# Patient Record
Sex: Female | Born: 1950 | Hispanic: Yes | Marital: Married | Smoking: Never smoker
Health system: Southern US, Community
[De-identification: ages and names within clinical notes are randomized; demographics above are authoritative.]

## PROBLEM LIST (undated history)

## (undated) DIAGNOSIS — F32A Depression, unspecified: Secondary | ICD-10-CM

## (undated) DIAGNOSIS — M1990 Unspecified osteoarthritis, unspecified site: Secondary | ICD-10-CM

## (undated) DIAGNOSIS — M810 Age-related osteoporosis without current pathological fracture: Secondary | ICD-10-CM

## (undated) DIAGNOSIS — I10 Essential (primary) hypertension: Secondary | ICD-10-CM

## (undated) DIAGNOSIS — A609 Anogenital herpesviral infection, unspecified: Secondary | ICD-10-CM

## (undated) DIAGNOSIS — E039 Hypothyroidism, unspecified: Secondary | ICD-10-CM

## (undated) DIAGNOSIS — C50919 Malignant neoplasm of unspecified site of unspecified female breast: Secondary | ICD-10-CM

## (undated) DIAGNOSIS — Z973 Presence of spectacles and contact lenses: Secondary | ICD-10-CM

## (undated) DIAGNOSIS — E042 Nontoxic multinodular goiter: Secondary | ICD-10-CM

## (undated) DIAGNOSIS — IMO0001 Reserved for inherently not codable concepts without codable children: Secondary | ICD-10-CM

## (undated) DIAGNOSIS — N189 Chronic kidney disease, unspecified: Secondary | ICD-10-CM

## (undated) DIAGNOSIS — M069 Rheumatoid arthritis, unspecified: Secondary | ICD-10-CM

## (undated) DIAGNOSIS — C801 Malignant (primary) neoplasm, unspecified: Secondary | ICD-10-CM

## (undated) DIAGNOSIS — B009 Herpesviral infection, unspecified: Secondary | ICD-10-CM

## (undated) DIAGNOSIS — IMO0002 Reserved for concepts with insufficient information to code with codable children: Secondary | ICD-10-CM

## (undated) DIAGNOSIS — M8580 Other specified disorders of bone density and structure, unspecified site: Secondary | ICD-10-CM

## (undated) DIAGNOSIS — T7840XA Allergy, unspecified, initial encounter: Secondary | ICD-10-CM

## (undated) DIAGNOSIS — E7800 Pure hypercholesterolemia, unspecified: Secondary | ICD-10-CM

## (undated) DIAGNOSIS — F329 Major depressive disorder, single episode, unspecified: Secondary | ICD-10-CM

## (undated) DIAGNOSIS — E119 Type 2 diabetes mellitus without complications: Secondary | ICD-10-CM

## (undated) HISTORY — DX: Reserved for concepts with insufficient information to code with codable children: IMO0002

## (undated) HISTORY — DX: Allergy, unspecified, initial encounter: T78.40XA

## (undated) HISTORY — DX: Reserved for inherently not codable concepts without codable children: IMO0001

## (undated) HISTORY — DX: Nontoxic multinodular goiter: E04.2

## (undated) HISTORY — DX: Anogenital herpesviral infection, unspecified: A60.9

## (undated) HISTORY — PX: COLONOSCOPY: SHX174

## (undated) HISTORY — DX: Rheumatoid arthritis, unspecified: M06.9

## (undated) HISTORY — DX: Depression, unspecified: F32.A

## (undated) HISTORY — DX: Hypothyroidism, unspecified: E03.9

## (undated) HISTORY — DX: Chronic kidney disease, unspecified: N18.9

## (undated) HISTORY — DX: Other specified disorders of bone density and structure, unspecified site: M85.80

## (undated) HISTORY — PX: BREAST SURGERY: SHX581

## (undated) HISTORY — DX: Unspecified osteoarthritis, unspecified site: M19.90

## (undated) HISTORY — DX: Malignant (primary) neoplasm, unspecified: C80.1

## (undated) HISTORY — DX: Herpesviral infection, unspecified: B00.9

## (undated) HISTORY — PX: WISDOM TOOTH EXTRACTION: SHX21

## (undated) HISTORY — DX: Age-related osteoporosis without current pathological fracture: M81.0

## (undated) HISTORY — DX: Essential (primary) hypertension: I10

## (undated) HISTORY — DX: Major depressive disorder, single episode, unspecified: F32.9

## (undated) HISTORY — DX: Pure hypercholesterolemia, unspecified: E78.00

## (undated) HISTORY — DX: Type 2 diabetes mellitus without complications: E11.9

---

## 1991-09-18 HISTORY — PX: CHOLECYSTECTOMY: SHX55

## 1994-09-17 HISTORY — PX: TUBAL LIGATION: SHX77

## 1998-06-25 ENCOUNTER — Encounter: Payer: Self-pay | Admitting: Emergency Medicine

## 1998-06-25 ENCOUNTER — Emergency Department (HOSPITAL_COMMUNITY): Admission: EM | Admit: 1998-06-25 | Discharge: 1998-06-25 | Payer: Self-pay | Admitting: Emergency Medicine

## 1999-12-04 ENCOUNTER — Other Ambulatory Visit: Admission: RE | Admit: 1999-12-04 | Discharge: 1999-12-04 | Payer: Self-pay | Admitting: Gynecology

## 2000-12-04 ENCOUNTER — Other Ambulatory Visit: Admission: RE | Admit: 2000-12-04 | Discharge: 2000-12-04 | Payer: Self-pay | Admitting: Gynecology

## 2000-12-23 ENCOUNTER — Ambulatory Visit (HOSPITAL_COMMUNITY): Admission: RE | Admit: 2000-12-23 | Discharge: 2000-12-23 | Payer: Self-pay | Admitting: Gynecology

## 2000-12-23 ENCOUNTER — Encounter: Payer: Self-pay | Admitting: Gynecology

## 2001-01-10 ENCOUNTER — Encounter: Payer: Self-pay | Admitting: Gynecology

## 2001-01-10 ENCOUNTER — Ambulatory Visit (HOSPITAL_COMMUNITY): Admission: RE | Admit: 2001-01-10 | Discharge: 2001-01-10 | Payer: Self-pay | Admitting: Gynecology

## 2001-01-23 ENCOUNTER — Encounter (INDEPENDENT_AMBULATORY_CARE_PROVIDER_SITE_OTHER): Payer: Self-pay | Admitting: *Deleted

## 2001-01-23 ENCOUNTER — Encounter: Payer: Self-pay | Admitting: General Surgery

## 2001-01-23 ENCOUNTER — Ambulatory Visit (HOSPITAL_COMMUNITY): Admission: RE | Admit: 2001-01-23 | Discharge: 2001-01-23 | Payer: Self-pay | Admitting: General Surgery

## 2001-07-29 ENCOUNTER — Ambulatory Visit (HOSPITAL_COMMUNITY): Admission: RE | Admit: 2001-07-29 | Discharge: 2001-07-29 | Payer: Self-pay | Admitting: General Surgery

## 2001-07-29 ENCOUNTER — Encounter: Payer: Self-pay | Admitting: General Surgery

## 2001-12-09 ENCOUNTER — Other Ambulatory Visit: Admission: RE | Admit: 2001-12-09 | Discharge: 2001-12-09 | Payer: Self-pay | Admitting: Gynecology

## 2002-02-05 ENCOUNTER — Ambulatory Visit (HOSPITAL_COMMUNITY): Admission: RE | Admit: 2002-02-05 | Discharge: 2002-02-05 | Payer: Self-pay | Admitting: General Surgery

## 2002-02-05 ENCOUNTER — Encounter: Payer: Self-pay | Admitting: General Surgery

## 2002-12-16 ENCOUNTER — Other Ambulatory Visit: Admission: RE | Admit: 2002-12-16 | Discharge: 2002-12-16 | Payer: Self-pay | Admitting: Gynecology

## 2003-02-16 ENCOUNTER — Emergency Department (HOSPITAL_COMMUNITY): Admission: EM | Admit: 2003-02-16 | Discharge: 2003-02-16 | Payer: Self-pay | Admitting: Emergency Medicine

## 2003-08-25 ENCOUNTER — Ambulatory Visit (HOSPITAL_COMMUNITY): Admission: RE | Admit: 2003-08-25 | Discharge: 2003-08-25 | Payer: Self-pay | Admitting: Gastroenterology

## 2004-03-19 ENCOUNTER — Emergency Department (HOSPITAL_COMMUNITY): Admission: EM | Admit: 2004-03-19 | Discharge: 2004-03-20 | Payer: Self-pay | Admitting: Emergency Medicine

## 2004-10-26 ENCOUNTER — Other Ambulatory Visit: Admission: RE | Admit: 2004-10-26 | Discharge: 2004-10-26 | Payer: Self-pay | Admitting: Gynecology

## 2005-11-05 ENCOUNTER — Other Ambulatory Visit: Admission: RE | Admit: 2005-11-05 | Discharge: 2005-11-05 | Payer: Self-pay | Admitting: Gynecology

## 2006-01-15 ENCOUNTER — Encounter: Admission: RE | Admit: 2006-01-15 | Discharge: 2006-01-15 | Payer: Self-pay | Admitting: Cardiology

## 2006-10-10 ENCOUNTER — Ambulatory Visit (HOSPITAL_COMMUNITY): Admission: RE | Admit: 2006-10-10 | Discharge: 2006-10-10 | Payer: Self-pay | Admitting: Gastroenterology

## 2006-11-06 ENCOUNTER — Other Ambulatory Visit: Admission: RE | Admit: 2006-11-06 | Discharge: 2006-11-06 | Payer: Self-pay | Admitting: Gynecology

## 2007-10-16 ENCOUNTER — Emergency Department (HOSPITAL_COMMUNITY): Admission: EM | Admit: 2007-10-16 | Discharge: 2007-10-16 | Payer: Self-pay | Admitting: Emergency Medicine

## 2007-11-13 ENCOUNTER — Other Ambulatory Visit: Admission: RE | Admit: 2007-11-13 | Discharge: 2007-11-13 | Payer: Self-pay | Admitting: Gynecology

## 2008-04-07 ENCOUNTER — Ambulatory Visit: Payer: Self-pay | Admitting: Gastroenterology

## 2008-04-07 DIAGNOSIS — K7689 Other specified diseases of liver: Secondary | ICD-10-CM | POA: Insufficient documentation

## 2008-04-07 DIAGNOSIS — K219 Gastro-esophageal reflux disease without esophagitis: Secondary | ICD-10-CM | POA: Insufficient documentation

## 2008-04-08 DIAGNOSIS — R1031 Right lower quadrant pain: Secondary | ICD-10-CM | POA: Insufficient documentation

## 2008-04-19 ENCOUNTER — Emergency Department (HOSPITAL_COMMUNITY): Admission: EM | Admit: 2008-04-19 | Discharge: 2008-04-20 | Payer: Self-pay | Admitting: Emergency Medicine

## 2008-04-19 ENCOUNTER — Telehealth: Payer: Self-pay | Admitting: Gastroenterology

## 2008-04-21 ENCOUNTER — Encounter: Payer: Self-pay | Admitting: Gastroenterology

## 2010-06-26 ENCOUNTER — Encounter: Admission: RE | Admit: 2010-06-26 | Discharge: 2010-06-26 | Payer: Self-pay | Admitting: Chiropractic Medicine

## 2011-02-02 NOTE — Op Note (Signed)
[name], Destiny Moody NO.:  [id number]   MEDICAL RECORD NO.:  [id number]                   PATIENT TYPE:  AMB   LOCATION:  ENDO                                 FACILITY:  [facility]   PHYSICIAN:  Anselmo Rod, M.D.               DATE OF BIRTH:  [birth year]   DATE OF PROCEDURE:  08/25/2003  DATE OF DISCHARGE:                                 OPERATIVE REPORT   PROCEDURE PERFORMED:  Screening colonoscopy.   ENDOSCOPIST:  Anselmo Rod, M.D.   INSTRUMENT USED:  Olympus videocolonoscope.   INDICATIONS FOR PROCEDURE:  This is a 60 year old female with a family  history of colon cancer and personal history of rectal bleeding, undergoing  screening colonoscopy.  Rule out colonic polyps, masses, etc.   PREPROCEDURE PREPARATION:  Informed consent was secured from the patient.  The patient fasted for eight hours prior to the procedure and prepped with a  bottle of magnesium citrate and one gallon of GoLYTELY the night prior to  the procedure.   PREPROCEDURE PHYSICAL:  VITAL SIGNS:  Stable.  NECK:  Supple.  CHEST:  Clear to auscultation.  HEART:  S1, S2 regular.  ABDOMEN:  Soft, with normal bowel sounds.   DESCRIPTION OF THE PROCEDURE:  The patient was placed in the left lateral  decubitus position and sedated with 90 mg of Demerol and 9 mg of Versed in  slow incremental doses.  Once the patient was adequately sedated and  maintained on low-flow oxygen and continuous cardiac monitor, the Olympus  videocolonoscope was advanced from the rectum to the cecum without  difficulty.  The patient had a fairly good prep.  No masses, polyps,  erosions, ulcerations, or diverticula were seen.  The appendiceal orifice  and ileocecal valve were clearly visualized and photographed.  Retroflexion  of the rectum revealed small nonbleeding internal hemorrhoids.  The patient  tolerated the procedure well without complications.   IMPRESSION:  Normal colonoscopy up to the  cecum except for small internal  hemorrhoids.   RECOMMENDATIONS:  1. Continue on high-fiber diet with liberal fluid intake.  2. Repeat CRC screening in the next five years unless the patient develops     any abnormal symptoms in the interim.  3. Outpatient follow-up as need arises in the future.                                               Anselmo Rod, M.D.    JNM/MEDQ  D:  08/25/2003  T:  08/25/2003  Job:  [id number]   cc:   Gaetano Hawthorne. Lily Peer, M.D.  [address]  [place]  [place] [address]  Fax: [phone]   Gabriel Earing, M.D.  [address]  [place]  [place] [address]  Fax: [phone]

## 2011-03-02 ENCOUNTER — Other Ambulatory Visit (HOSPITAL_COMMUNITY)
Admission: RE | Admit: 2011-03-02 | Discharge: 2011-03-02 | Disposition: A | Discharge status: Home / Self Care | Payer: Self-pay | Source: Ambulatory Visit | Attending: Gynecology | Admitting: Gynecology

## 2011-03-02 ENCOUNTER — Ambulatory Visit (INDEPENDENT_AMBULATORY_CARE_PROVIDER_SITE_OTHER): Payer: Self-pay | Admitting: Gynecology

## 2011-03-02 ENCOUNTER — Other Ambulatory Visit: Payer: Self-pay | Admitting: Gynecology

## 2011-03-02 DIAGNOSIS — Z1211 Encounter for screening for malignant neoplasm of colon: Secondary | ICD-10-CM

## 2011-03-02 DIAGNOSIS — Z124 Encounter for screening for malignant neoplasm of cervix: Secondary | ICD-10-CM | POA: Insufficient documentation

## 2011-03-02 DIAGNOSIS — Z01419 Encounter for gynecological examination (general) (routine) without abnormal findings: Secondary | ICD-10-CM

## 2011-03-02 DIAGNOSIS — I10 Essential (primary) hypertension: Secondary | ICD-10-CM

## 2011-04-17 DIAGNOSIS — E119 Type 2 diabetes mellitus without complications: Secondary | ICD-10-CM | POA: Insufficient documentation

## 2011-06-07 LAB — POCT I-STAT CREATININE: Operator id: 285841

## 2011-06-07 LAB — URINALYSIS, ROUTINE W REFLEX MICROSCOPIC
Bilirubin Urine: NEGATIVE
Glucose, UA: NEGATIVE
Nitrite: NEGATIVE
Protein, ur: NEGATIVE
Specific Gravity, Urine: 1.025

## 2011-06-07 LAB — HEPATIC FUNCTION PANEL
ALT: 37 — ABNORMAL HIGH
Bilirubin, Direct: <0.1
Total Protein: 7.4

## 2011-06-07 LAB — URINE MICROSCOPIC-ADD ON

## 2011-06-07 LAB — DIFFERENTIAL
Eosinophils Absolute: 0.1
Eosinophils Relative: 1
Lymphocytes Relative: 24
Monocytes Absolute: 0.6
Monocytes Relative: 5
Neutrophils Relative %: 70

## 2011-06-07 LAB — CBC
MCHC: 34.4
MCV: 89.6
RBC: 4.56
RDW: 12.6

## 2011-06-07 LAB — I-STAT 8, (EC8 V) (CONVERTED LAB)
Acid-base deficit: 2
Bicarbonate: 23.6
Chloride: 107
Glucose, Bld: 128 — ABNORMAL HIGH
Operator id: 285841
Potassium: 3.7
Sodium: 140
pCO2, Ven: 41.5 — ABNORMAL LOW

## 2011-06-07 LAB — URINE CULTURE

## 2011-06-15 LAB — BASIC METABOLIC PANEL
CO2: 24
Calcium: 9.6
Chloride: 105
GFR calc Af Amer: >60
GFR calc non Af Amer: >60
Sodium: 139

## 2011-06-15 LAB — DIFFERENTIAL
Eosinophils Relative: 0
Lymphocytes Relative: 14
Lymphs Abs: 2.2
Monocytes Absolute: 1.1 — ABNORMAL HIGH

## 2011-06-15 LAB — CBC
HCT: 39.6
Hemoglobin: 13.7
WBC: 16.1 — ABNORMAL HIGH

## 2011-06-15 LAB — URINALYSIS, ROUTINE W REFLEX MICROSCOPIC
Bilirubin Urine: NEGATIVE
Glucose, UA: NEGATIVE
pH: 6.5

## 2011-06-15 LAB — HEPATIC FUNCTION PANEL
Albumin: 3.9
Total Bilirubin: 1
Total Protein: 7.5

## 2011-06-15 LAB — URINE MICROSCOPIC-ADD ON

## 2011-06-15 LAB — GLUCOSE, CAPILLARY

## 2011-06-15 LAB — LIPASE, BLOOD: Lipase: 26

## 2011-06-15 LAB — URINE CULTURE: Colony Count: >=100000

## 2013-04-14 ENCOUNTER — Encounter: Payer: Self-pay | Admitting: Neurology

## 2013-04-14 ENCOUNTER — Ambulatory Visit (INDEPENDENT_AMBULATORY_CARE_PROVIDER_SITE_OTHER): Payer: BC Managed Care – PPO | Admitting: Neurology

## 2013-04-14 VITALS — BP 153/81 | HR 74 | Temp 97.7°F | Ht 62.0 in | Wt 168.0 lb

## 2013-04-14 DIAGNOSIS — Z8669 Personal history of other diseases of the nervous system and sense organs: Secondary | ICD-10-CM

## 2013-04-14 DIAGNOSIS — Z87898 Personal history of other specified conditions: Secondary | ICD-10-CM

## 2013-04-14 DIAGNOSIS — G44209 Tension-type headache, unspecified, not intractable: Secondary | ICD-10-CM

## 2013-04-14 DIAGNOSIS — Z9189 Other specified personal risk factors, not elsewhere classified: Secondary | ICD-10-CM

## 2013-04-14 DIAGNOSIS — G43009 Migraine without aura, not intractable, without status migrainosus: Secondary | ICD-10-CM

## 2013-04-14 MED ORDER — GABAPENTIN 100 MG PO CAPS: ORAL_CAPSULE | ORAL | Status: DC

## 2013-04-14 NOTE — Progress Notes (Signed)
Subjective:    Patient ID: Destiny Moody is a 62 y.o. female.  HPI  Destiny Foley, MD, PhD [facility] [address], [address] [address] [place], [place] [address]  Dear Dr. Andi Devon,   I saw your patient, Destiny Moody, upon your kind request in my neurologic clinic today for initial consultation of her headaches. The patient is unaccompanied today. As you know, Destiny Moody is a very pleasant 62 year old right-handed woman with an underlying medical history of diabetes, obesity, and sinusitis, who has been having recurrent headaches. She was recently treated for right sphenoid sinusitis which was seen on the head CT from April 2014 which I reviewed. Her CT head without contrast from 12/25/2012 showed no acute intracranial changes, calcific atherosclerotic vascular disease, right sphenoid sinusitis. She also had recent blood work which are reviewed including CMP, CBC, lipid profile, hemoglobin A1c which was elevated at 8.3. She's currently on glymeperide, lisinopril, metformin, Actos.  She reports a right headache, which is described as a constant ache which starts in the R temple area, but also a stabbing sensation around the R eye. Her headache (HA) frequency is 2 to 3 per week. She estimates that there are 9 HA days/month. There is associated nausea, and there is no photophobia and sonophobia. Never had TIA or stroke symptoms, denying sudden onset of one sided weakness, numbness, tingling, slurring of speech or droopy face, hearing loss, tinnitus, diplopia or visual field cut or monocular loss of vision, although she does add, that she had one eye vision loss for a minute last year. She also reports a fainting spell last year.  She snores, but reports no apneas and no choking sensations.   The patient was upset as she was told to come at 9 AM for her 10 AM appointment and I was also late to see her. I apologized to her and she was okay during the appointment.   Her Past  Medical History Is Significant For: Past Medical History  Diagnosis Date  . Hypothyroidism   . Rheumatoid arthritis(714.0)   . Osteopenia   . NIDDM (non-insulin dependent diabetes mellitus)   . Multinodular thyroid   . Hypercholesterolemia   . HSV infection     Her Past Surgical History Is Significant For: Past Surgical History  Procedure Laterality Date  . Cholecystectomy  1993  . Tubal ligation  1996    re annastomosis  . Cesarean section  [id number]    Her Family History Is Significant For: Family History  Problem Relation Age of Onset  . Diabetes Mother     niddm  . Heart disease Mother   . Heart failure Father   . Heart disease Father   . Ovarian cancer Cousin     Her Social History Is Significant For: History   Social History  . Marital Status: Married    Spouse Name: Arnoldo    Number of Children: 2  . Years of Education: College   Occupational History  .      Self Employed   Social History Main Topics  . Smoking status: Never Smoker   . Smokeless tobacco: Never Used  . Alcohol Use: Yes     Comment: occasionally  . Drug Use: None  . Sexually Active: None   Other Topics Concern  . None   Social History Narrative   Patient lives at home with her spouse.   Caffeine use: 1 cup daily    Her Allergies Are:  No Known Allergies:  Her Current Medications Are:  Outpatient Encounter Prescriptions as of 04/14/2013  Medication Sig Dispense Refill  . glimepiride (AMARYL) 4 MG tablet Take 4 mg by mouth 2 (two) times daily.      Marland Kitchen lisinopril (PRINIVIL,ZESTRIL) 2.5 MG tablet Take 2.5 mg by mouth daily.        . metFORMIN (GLUMETZA) 1000 MG (MOD) 24 hr tablet Take 1,000 mg by mouth 2 (two) times daily with a meal.       . pioglitazone-metformin (ACTOPLUS MET) 15-500 MG per tablet Take 1 tablet by mouth 2 (two) times daily with a meal.      . simvastatin (ZOCOR) 20 MG tablet Take 20 mg by mouth at bedtime.        . [DISCONTINUED] Calcium Carbonate-Vit D-Min  (CALCIUM 1200 PO) Take by mouth 1 dose over 24 hours.        . [DISCONTINUED] glucosamine-chondroitin 500-400 MG tablet Take by mouth.        . [DISCONTINUED] Multiple Vitamin (MULTIVITAMIN) capsule Take 1 capsule by mouth daily.        . [DISCONTINUED] TURMERIC PO Take by mouth.         No facility-administered encounter medications on file as of 04/14/2013.  : Review of Systems  Constitutional: Positive for unexpected weight change (weight gain).  HENT: Positive for trouble swallowing.        Ringing in ears Spinning Sensation  Eyes: Positive for pain.  Respiratory: Positive for cough.   Cardiovascular: Positive for leg swelling.  Endocrine: Positive for polydipsia.       Flushing  Musculoskeletal: Positive for arthralgias.  Skin:       Birth marks Itching  Allergic/Immunologic: Positive for environmental allergies.  Neurological: Positive for dizziness and headaches.       Difficulty Swallowing  Psychiatric/Behavioral: Positive for sleep disturbance (snoring).    Objective:  Neurologic Exam  Physical Exam Physical Examination:   Filed Vitals:   04/14/13 1028  BP: 153/81  Pulse: 74  Temp: 97.7 F (36.5 C)    General Examination: The patient is a very pleasant 62 y.o. female in no acute distress. She appears well-developed and well-nourished and well groomed.   HEENT: Normocephalic, atraumatic, pupils are equal, round and reactive to light and accommodation. Funduscopic exam is normal with sharp disc margins noted. Extraocular tracking is good without limitation to gaze excursion or nystagmus noted. Normal smooth pursuit is noted. Hearing is grossly intact. Tympanic membranes are clear bilaterally. Face is symmetric with normal facial animation and normal facial sensation. Speech is clear with no dysarthria noted. There is no hypophonia. There is no lip, neck/head, jaw or voice tremor. Neck is supple with full range of passive and active motion. There are no carotid bruits  on auscultation. Oropharynx exam reveals: mild mouth dryness, adequate dental hygiene and mild airway crowding. Mallampati is class II. Tongue protrudes centrally and palate elevates symmetrically.   Chest: Clear to auscultation without wheezing, rhonchi or crackles noted.  Heart: S1+S2+0, regular and normal without murmurs, rubs or gallops noted.   Abdomen: Soft, non-tender and non-distended with normal bowel sounds appreciated on auscultation.  Extremities: There is no pitting edema in the distal lower extremities bilaterally. Pedal pulses are intact.  Skin: Warm and dry without trophic changes noted. There are no varicose veins.  Musculoskeletal: exam reveals no obvious joint deformities, tenderness or joint swelling or erythema.   Neurologically:  Mental status: The patient is awake, alert and oriented in all 4 spheres. Her memory,  attention, language and knowledge are appropriate. There is no aphasia, agnosia, apraxia or anomia. Speech is clear with normal prosody and enunciation. Thought process is linear. Mood is congruent and affect is normal.  Cranial nerves are as described above under HEENT exam. In addition, shoulder shrug is normal with equal shoulder height noted. Motor exam: Normal bulk, strength and tone is noted. There is no drift, tremor or rebound. Romberg is negative. Reflexes are 2+ throughout. Toes are downgoing bilaterally. Fine motor skills are intact with normal finger taps, normal hand movements, normal rapid alternating patting, normal foot taps and normal foot agility.  Cerebellar testing shows no dysmetria or intention tremor on finger to nose testing. Heel to shin is unremarkable bilaterally. There is no truncal or gait ataxia.  Sensory exam is intact to light touch, pinprick, vibration, temperature sense and proprioception in the upper and lower extremities.  Gait, station and balance are unremarkable. No veering to one side is noted. No leaning to one side is  noted. Posture is age-appropriate and stance is narrow based. No problems turning are noted. She turns en bloc. Tandem walk is unremarkable. Intact toe and heel stance is noted.               Assessment and Plan:   In summary, Netra Foulks is a very pleasant 62 y.o.-year old female with a history of HA, which has migrainous and tension type features. Her physical exam is stable and non-focal. She is doing fairly well at this time and I reassured the patient in that regard.  I had a long chat with the patient about my findings and the diagnosis of migraine and tension HA, the prognosis and treatment options. We talked about medical treatments and non-pharmacological approaches. We talked about maintaining a healthy lifestyle in general. I encouraged the patient to eat healthy, exercise daily and keep well hydrated, to keep a scheduled bedtime and wake time routine, to not skip any meals and eat healthy snacks in between meals and to have protein with every meal.   As far as further diagnostic testing is concerned, I suggested the following today: MRI brain wo Gad, given her Hx of syncope and monocular vision loss reported. Blood work, including ESR, CRP, ANA.  As far as medications are concerned, I recommended the following at this time: trial of neurontin.  I answered all her questions today and the patient was in agreement with the above outlined plan. I would like to see the patient back in 3 months, sooner if the need arises and encouraged her to call with any interim questions, concerns, problems or updates and refill requests and test results.   Thank you very much for allowing me to participate in the care of this nice patient. If I can be of any further assistance to you please do not hesitate to call me at [phone].  Sincerely,   Destiny Foley, MD, PhD

## 2013-04-14 NOTE — Patient Instructions (Addendum)
Please remember, common headache triggers are: sleep deprivation, dehydration, overheating, stress, hypoglycemia or skipping meals, excessive pain medications or excessive alcohol use. Some people have food triggers such as aged cheese, orange juice or chocolate, especially dark chocolate. Try to avoid these headache triggers as much possible. It may be helpful to keep a headache diary to figure out what makes your headaches worse or brings them on. Some people report headache onset after exercise but studies have shown that regular exercise may actually prevent headaches from coming on. If you have exercise-induced headaches, please make sure that you drink plenty of fluid before and after exercising and that you don't over do it.  I think overall you are doing fairly well but I do want to suggest a few things today:  Remember to drink plenty of fluid, eat healthy meals and do not skip any meals. Try to eat protein with a every meal and eat a healthy snack such as fruit or nuts in between meals. Try to keep a regular sleep-wake schedule and try to exercise daily, particularly in the form of walking, 20-30 minutes a day, if you can.   As far as your medications are concerned, I would like to suggest: Start Neurontin (gabapentin) 100 mg strength: Take 1 pill nightly at bedtime for 1 week, then 2 pills nightly for 1 week, then 3 pills each night thereafter. The most common side effects reported are sedation or sleepiness. Rare side effects include balance problems, confusion.    As far as diagnostic testing: MRI brain, blood work.  I would like to see you back in 3 months, sooner if we need to. Please call us with any interim questions, concerns, problems, updates or refill requests.  Please also call us for any test results so we can go over those with you on the phone. Brett Canales is my clinical assistant and will answer any of your questions and relay your messages to me and also relay most of my messages to  you.  Our phone number is [phone]. We also have an after hours call service for urgent matters and there is a physician on-call for urgent questions. For any emergencies you know to call 911 or go to the nearest emergency room.

## 2013-04-15 LAB — ANA W/REFLEX: Anti Nuclear Antibody(ANA): NEGATIVE

## 2013-04-15 LAB — RPR: RPR: NONREACTIVE

## 2013-04-15 NOTE — Progress Notes (Signed)
Quick Note:  Please call and advise the patient that the recent labs we checked were within normal limits. We checked inflammatory markers, sedimentation rate. No further action is required on these tests at this time. Please remind patient to keep any upcoming appointments or tests and to call us with any interim questions, concerns, problems or updates. Thanks,  [name], MD, PhD    ______

## 2013-04-16 NOTE — Progress Notes (Signed)
Quick Note:  Spoke with patient and relayed results of blood work. The patient was also reminded of any future appointments. Patient understood and had no questions.  ______ 

## 2013-04-30 ENCOUNTER — Ambulatory Visit (INDEPENDENT_AMBULATORY_CARE_PROVIDER_SITE_OTHER): Payer: BC Managed Care – PPO

## 2013-04-30 DIAGNOSIS — Z87898 Personal history of other specified conditions: Secondary | ICD-10-CM

## 2013-04-30 DIAGNOSIS — G44209 Tension-type headache, unspecified, not intractable: Secondary | ICD-10-CM

## 2013-04-30 DIAGNOSIS — Z8669 Personal history of other diseases of the nervous system and sense organs: Secondary | ICD-10-CM

## 2013-04-30 DIAGNOSIS — G43009 Migraine without aura, not intractable, without status migrainosus: Secondary | ICD-10-CM

## 2013-04-30 DIAGNOSIS — R51 Headache: Secondary | ICD-10-CM

## 2013-05-01 NOTE — Progress Notes (Signed)
Quick Note:  Please call patient regarding the recent brain MRI: The brain scan showed a normal structure of the brain but mild volume loss which we call atrophy. There were changes in the deeper structures of the brain, which we call white matter changes or microvascular changes. These were mild also: These are tiny white spots, that occur with time and are seen in a variety of conditions, including with normal aging, chronic hypertension, chronic headaches, especially migraine HAs, chronic diabetes, chronic hyperlipidemia. These are not strokes and no mass or lesion or contrast enhancement was seen which is reassuring. Again, there were no acute findings, such as a stroke, or mass or blood products. No further action is required on this test at this time, other than re-enforcing the importance of good blood pressure control, good cholesterol control, good blood sugar control, and weight management. Please remind patient to keep any upcoming appointments or tests and to call us with any interim questions, concerns, problems or updates. Thanks,  [name], MD, PhD    ______

## 2013-05-04 NOTE — Progress Notes (Signed)
Quick Note:  Spoke with patient and relayed the results of their MRI as well as Dr Athar's advise or recommendations. The patient was also reminded of any future appointments. The patient understood and had no questions.  ______ 

## 2013-08-03 ENCOUNTER — Telehealth: Payer: Self-pay | Admitting: *Deleted

## 2013-08-04 NOTE — Telephone Encounter (Signed)
Spoke with pt. and rescheduled 08/12/13 appointment to 08/05/13 at 9AM.

## 2013-08-05 ENCOUNTER — Ambulatory Visit: Payer: Self-pay | Admitting: Neurology

## 2013-08-12 ENCOUNTER — Ambulatory Visit: Payer: BC Managed Care – PPO | Admitting: Neurology

## 2013-08-18 ENCOUNTER — Ambulatory Visit: Payer: BC Managed Care – PPO | Admitting: Neurology

## 2013-09-17 DIAGNOSIS — M810 Age-related osteoporosis without current pathological fracture: Secondary | ICD-10-CM

## 2013-09-17 HISTORY — DX: Age-related osteoporosis without current pathological fracture: M81.0

## 2014-04-27 ENCOUNTER — Encounter: Payer: Self-pay | Admitting: Gynecology

## 2014-04-27 ENCOUNTER — Ambulatory Visit (INDEPENDENT_AMBULATORY_CARE_PROVIDER_SITE_OTHER): Payer: BC Managed Care – PPO | Admitting: Gynecology

## 2014-04-27 ENCOUNTER — Other Ambulatory Visit (HOSPITAL_COMMUNITY)
Admission: RE | Admit: 2014-04-27 | Discharge: 2014-04-27 | Disposition: A | Discharge status: Home / Self Care | Payer: BC Managed Care – PPO | Source: Ambulatory Visit | Attending: Gynecology | Admitting: Gynecology

## 2014-04-27 VITALS — BP 122/78 | Ht 62.0 in | Wt 156.0 lb

## 2014-04-27 DIAGNOSIS — Z01419 Encounter for gynecological examination (general) (routine) without abnormal findings: Secondary | ICD-10-CM

## 2014-04-27 DIAGNOSIS — N952 Postmenopausal atrophic vaginitis: Secondary | ICD-10-CM | POA: Insufficient documentation

## 2014-04-27 DIAGNOSIS — Z780 Asymptomatic menopausal state: Secondary | ICD-10-CM

## 2014-04-27 DIAGNOSIS — Z1151 Encounter for screening for human papillomavirus (HPV): Secondary | ICD-10-CM | POA: Insufficient documentation

## 2014-04-27 DIAGNOSIS — Z79890 Hormone replacement therapy (postmenopausal): Secondary | ICD-10-CM

## 2014-04-27 DIAGNOSIS — N951 Menopausal and female climacteric states: Secondary | ICD-10-CM

## 2014-04-27 MED ORDER — NONFORMULARY OR COMPOUNDED ITEM: Status: DC

## 2014-04-27 NOTE — Progress Notes (Signed)
Diara Chaudhari 1951-02-09 [id number]   History:    63 y.o.  for annual gyn exam who has not been seen in the office since 2012. Patient is currently being followed by endocrinologists in [place], [place]. who is monitoring her type 2 diabetes, hyperlipidemia and hypertension. Patient at one time had multinodular goiter and had biopsied by general surgeon. Patient is euthyroid currently not taking any thyroid medication. Her last bone density study was in 2008 whereby the lowest T score was -2.3 at the right femoral neck. FRAX Analysis was not done at that time. Patient had a normal colonoscopy in 2004. Patient with no past history of abnormal Pap smears. Patient complaining of decreased libido and vaginal dryness but no vasomotor  symptoms. Patient not on any HRT. Patient reached menopause in her late 66s.   Past medical history,surgical history, family history and social history were all reviewed and documented in the EPIC chart.  Gynecologic History No LMP recorded. Patient is postmenopausal. Contraception: post menopausal status Last Pap: 2012. Results were: normal Last mammogram: 2012. Results were: normal  Obstetric History OB History  Gravida Para Term Preterm AB SAB TAB Ectopic Multiple Living  2 2        2     # Outcome Date GA Lbr Len/2nd Weight Sex Delivery Anes PTL Lv  2 PAR           1 PAR                ROS: A ROS was performed and pertinent positives and negatives are included in the history.  GENERAL: No fevers or chills. HEENT: No change in vision, no earache, sore throat or sinus congestion. NECK: No pain or stiffness. CARDIOVASCULAR: No chest pain or pressure. No palpitations. PULMONARY: No shortness of breath, cough or wheeze. GASTROINTESTINAL: No abdominal pain, nausea, vomiting or diarrhea, melena or bright red blood per rectum. GENITOURINARY: No urinary frequency, urgency, hesitancy or dysuria. MUSCULOSKELETAL: No joint or muscle pain, no back pain, no recent trauma.  DERMATOLOGIC: No rash, no itching, no lesions. ENDOCRINE: No polyuria, polydipsia, no heat or cold intolerance. No recent change in weight. HEMATOLOGICAL: No anemia or easy bruising or bleeding. NEUROLOGIC: No headache, seizures, numbness, tingling or weakness. PSYCHIATRIC: No depression, no loss of interest in normal activity or change in sleep pattern.     Exam: chaperone present  BP 122/78  Ht 5\' 2"  (1.575 m)  Wt 156 lb (70.761 kg)  BMI 28.53 kg/m2  Body mass index is 28.53 kg/(m^2).  General appearance : Well developed well nourished female. No acute distress HEENT: Neck supple, trachea midline, no carotid bruits, no thyroidmegaly Lungs: Clear to auscultation, no rhonchi or wheezes, or rib retractions  Heart: Regular rate and rhythm, no murmurs or gallops Breast:Examined in sitting and supine position were symmetrical in appearance, no palpable masses or tenderness,  no skin retraction, no nipple inversion, no nipple discharge, no skin discoloration, no axillary or supraclavicular lymphadenopathy Abdomen: no palpable masses or tenderness, no rebound or guarding Extremities: no edema or skin discoloration or tenderness  Pelvic:  Bartholin, Urethra, Skene Glands: Within normal limits             Vagina: No gross lesions or discharge, Atrophic changes  Cervix: No gross lesions or discharge  Uterus  anteverted, normal size, shape and consistency, non-tender and mobile  Adnexa  Without masses or tenderness  Anus and perineum  normal   Rectovaginal  normal sphincter tone without palpated masses or tenderness  Hemoccult Hemoccult card provided     Assessment/Plan:  63 y.o. female for annual exam who is overdue for her colonoscopy will schedule accordingly. Patient will also have a bone density study here in office in the next few weeks. Patient was provided with a requisition to schedule her overdue mammogram as well. We discussed the importance of calcium and vitamin D and  regular exercise for osteoporosis prevention. We also discussed importance of monthly self breast examination. For her vaginal atrophy she is going to be started on vaginal estrogen 0.02% to apply twice a week. Risks benefits and pros and cons were discussed. She was provided with fecal Hemoccult cards to submit to the office for testing at a later date.  Note: This dictation was prepared with  Dragon/digital dictation along withSmart phrase technology. Any transcriptional errors that result from this process are unintentional.   Terrance Mass MD, 10:07 AM 04/27/2014

## 2014-04-27 NOTE — Patient Instructions (Signed)
Colonoscopa (Colonoscopy) Ardelia Mems colonoscopa es un examen que se realiza para examinar todo el intestino grueso (colon). Este examen puede ayudar a Hydrographic surveyor problemas, como tumores, plipos, inflamacin y reas de Social research officer, government. El examen dura aproximadamente 1hora.  INFORME A SU MDICO:   Cualquier alergia que tenga.  Todos los [employer]ell [employer], incluidos vitaminas, hierbas, gotas oftlmicas, cremas y medicamentos de venta libre.  Problemas previos que usted o los [employer] de su familia hayan tenido con el uso de anestsicos.  Enfermedades de [employer] Soup.  Cirugas previas.  Enfermedades patolgicas. RIESGOS Y COMPLICACIONES  En general, se trata de un procedimiento seguro. Sin embargo, Games developer procedimiento, pueden surgir complicaciones. Las complicaciones posibles son:  Hemorragias.  Desgarro o ruptura de la pared del colon.  Reaccin a los Stage manager.  Infeccin (raro). ANTES DEL PROCEDIMIENTO   Consulte a su mdico si debe cambiar o suspender los medicamentos que toma habitualmente.  Posiblemente se le recete una preparacin del colon por va oral. Esto incluye beber una gran cantidad de lquido medicinal desde el da anterior a su procedimiento. El lquido har que elimine muchas heces blandas hasta que sean casi claras o de color verdoso claro. De esta manera limpiar el colon para prepararlo para el procedimiento.  No coma ni beba nada ms una vez que haya comenzado con la preparacin del colon, a menos que el mdico le indique que es seguro [place].  Pdale a alguna persona que la lleve a su casa luego del procedimiento. PROCEDIMIENTO   Le administrarn un medicamento para que pueda relajarse (sedante).  Se recostar de costado con las rodillas flexionadas.  Se insertar un tubo largo y flexible con Ardelia Mems luz y Ardelia Mems cmara en el extremo (colonoscopio) a travs del recto y dentro del colon. La cmara enviar el video hacia  una pantalla de computadora a medida que se vaya moviendo por el colon. El colonoscopio tambin libera dixido de carbono para inflar el colon. Esto ayuda a que el mdico pueda ver mejor el rea.  Durante el examen, es posible que su mdico tome una pequea muestra de tejido (biopsia) para examinarla bajo el microscopio, si se encuentran anormalidades.  El examen finaliza cuando se ha examinado todo el colon. DESPUS DEL PROCEDIMIENTO   No conduzca vehculos durante las 24horas posteriores al examen.  Es posible que encuentre una pequea cantidad de sangre en la materia fecal.  Ileene Hutchinson tenga cantidades moderadas de gases y calambres o hinchazn abdominales leves. Esto se produce a causa del gas utilizado para inflar el colon durante el examen.  Pregunte cundo estarn [employer] del examen y cmo los obtendr. Asegrese de [place]. Document Released: 06/13/2005 Document Revised: 06/24/2013 [facility] Patient Information 2015 [place]. This information is not intended to replace advice given to you by your health care provider. Make sure you discuss any questions you have with your health care provider. Densitometra sea  (Bone Densitometry) La densitometra sea es una radiografa especial que mide la densidad de los huesos y se [place] para predecir el riesgo de fracturas seas. Esta estudio se [place] para Actor contenido mineral y la densidad de los huesos para diagnosticar osteoporosis. La osteoporosis es la prdida de tejido seo que hace que el hueso se debilite. Generalmente ocurre en las mujeres que entran en la menopausia. Pero tambin pueden sufrirla los hombres y personas con otras enfermedades.  PREPARACIN PARA LA PRUEBA  No es necesaria la preparacin.  Linzie Collin EXAMINARSE?  Todas las C[employer] Corporation de 42 aos.  Las mujeres posmenopusicas (24 a 16 aos) con factores de riesgo para osteoporosis.  Las personas que han sufrido  fracturas previas realizando actividades normales.  Las personas de contextura corporal delgada (menos de 127 libras [63.5 kg] o con un ndice de masa corporal [IMC] de menos de 21).  Las personas que tengan un padre que haya sufrido una fractura de cadera o que tengan antecedentes de osteoporosis.  Los fumadores.  Las personas que sufren artritis reumatoidea.  Los que consumen alcohol en exceso (ms de [place]).  Las mujeres con menopausia temprana. CUNDO DEBE REALIZAR UN [place]?  Las guas actuales sugieren que se debe esperar por lo menos 2 aos antes de repetir una prueba de densidad sea, si la primera fue normal. Algunos estudios recientes indican que las mujeres con densidad sea normal pueden esperar unos aos antes de repetir un estudio de densitometra sea. Comente estos temas con su mdico.  HALLAZGOS NORMALES:   Normal: menos de una desviacin estndar por debajo de lo normal (superior a -1).  Osteopenia:  1 a 2,5 desviaciones estndar por debajo de lo normal (-1 a -2,5).  Osteoporosis: ms de 2,5 desviaciones estndar por debajo de lo normal (menos de -2,5). Los [employer] se informan como una "puntuacin T" y Ardelia Mems "puntuacin Z". La puntuacin T es el nmero que compara la densidad sea con la densidad sea de las mujeres jvenes y sanas. La puntuacin Z es un nmero que compara la densidad sea con las puntuaciones de mujeres de la misma [place], gnero y International aid/development worker.  Los rangos para los resultados normales pueden variar entre diferentes laboratorios y hospitales. Consulte siempre con su mdico despus de Psychologist, counselling estudio para Personal assistant significado de los [place] y si los valores se consideran "dentro de los lmites normales".  SIGNIFICADO DEL ESTUDIO  El mdico leer los resultados y Teacher, English as a foreign language con usted la importancia y el significado de los [place], as como las opciones de tratamiento y la necesidad de pruebas adicionales, si fuera  necesario.  OBTENCIN DE LOS RESULTADOS DE LAS PRUEBAS  Es su responsabilidad retirar el resultado del [place]. Consulte en el laboratorio cuando y cmo podr [employer].  Document Released: 05/28/2012 [facility] Patient Information 2015 [place]. This information is not intended to replace advice given to you by your health care provider. Make sure you discuss any questions you have with your health care provider.

## 2014-04-28 LAB — CYTOLOGY - PAP

## 2014-04-30 ENCOUNTER — Encounter: Payer: Self-pay | Admitting: Women's Health

## 2014-05-13 ENCOUNTER — Other Ambulatory Visit: Payer: Self-pay

## 2014-05-13 DIAGNOSIS — Z1231 Encounter for screening mammogram for malignant neoplasm of breast: Secondary | ICD-10-CM

## 2014-05-26 ENCOUNTER — Ambulatory Visit
Admission: RE | Admit: 2014-05-26 | Discharge: 2014-05-26 | Disposition: A | Discharge status: Home / Self Care | Payer: BC Managed Care – PPO | Source: Ambulatory Visit

## 2014-05-26 DIAGNOSIS — Z1231 Encounter for screening mammogram for malignant neoplasm of breast: Secondary | ICD-10-CM

## 2014-05-31 ENCOUNTER — Other Ambulatory Visit: Payer: Self-pay | Admitting: Gynecology

## 2014-05-31 DIAGNOSIS — R928 Other abnormal and inconclusive findings on diagnostic imaging of breast: Secondary | ICD-10-CM

## 2014-06-11 ENCOUNTER — Ambulatory Visit
Admission: RE | Admit: 2014-06-11 | Discharge: 2014-06-11 | Disposition: A | Discharge status: Home / Self Care | Payer: BC Managed Care – PPO | Source: Ambulatory Visit | Attending: Gynecology | Admitting: Gynecology

## 2014-06-11 ENCOUNTER — Telehealth: Payer: Self-pay | Admitting: *Deleted

## 2014-06-11 ENCOUNTER — Other Ambulatory Visit: Payer: Self-pay | Admitting: Gynecology

## 2014-06-11 DIAGNOSIS — R928 Other abnormal and inconclusive findings on diagnostic imaging of breast: Secondary | ICD-10-CM

## 2014-06-11 DIAGNOSIS — N63 Unspecified lump in unspecified breast: Secondary | ICD-10-CM

## 2014-06-11 NOTE — Telephone Encounter (Signed)
Pt called with questions about having repeat diagnosed mammogram, all questions answered.

## 2014-06-15 ENCOUNTER — Ambulatory Visit
Admission: RE | Admit: 2014-06-15 | Discharge: 2014-06-15 | Disposition: A | Discharge status: Home / Self Care | Payer: BC Managed Care – PPO | Source: Ambulatory Visit | Attending: Gynecology | Admitting: Gynecology

## 2014-06-15 DIAGNOSIS — N63 Unspecified lump in unspecified breast: Secondary | ICD-10-CM

## 2014-07-01 ENCOUNTER — Other Ambulatory Visit: Payer: Self-pay | Admitting: Gynecology

## 2014-07-01 ENCOUNTER — Ambulatory Visit
Admission: RE | Admit: 2014-07-01 | Discharge: 2014-07-01 | Disposition: A | Discharge status: Home / Self Care | Payer: BC Managed Care – PPO | Source: Ambulatory Visit | Attending: Gynecology | Admitting: Gynecology

## 2014-07-01 ENCOUNTER — Telehealth: Payer: Self-pay | Admitting: *Deleted

## 2014-07-01 DIAGNOSIS — N63 Unspecified lump in unspecified breast: Secondary | ICD-10-CM

## 2014-07-01 NOTE — Telephone Encounter (Signed)
Where is it?

## 2014-07-01 NOTE — Telephone Encounter (Signed)
Destiny Moody from breast center called asking if you can please co-sign the breast ultrasound order.

## 2014-07-01 NOTE — Telephone Encounter (Signed)
I am not sure how orders look on your end. You may have to check with Sharrie Rothman.

## 2014-07-15 ENCOUNTER — Telehealth: Payer: Self-pay | Admitting: *Deleted

## 2014-07-15 ENCOUNTER — Ambulatory Visit
Admission: RE | Admit: 2014-07-15 | Discharge: 2014-07-15 | Disposition: A | Discharge status: Home / Self Care | Payer: BC Managed Care – PPO | Source: Ambulatory Visit | Attending: Gynecology | Admitting: Gynecology

## 2014-07-15 ENCOUNTER — Other Ambulatory Visit: Payer: Self-pay | Admitting: Gynecology

## 2014-07-15 ENCOUNTER — Other Ambulatory Visit: Payer: BC Managed Care – PPO

## 2014-07-15 DIAGNOSIS — N63 Unspecified lump in unspecified breast: Secondary | ICD-10-CM

## 2014-07-15 NOTE — Telephone Encounter (Signed)
Pt called with questions if she should proceed with MRI of breast order by breast center. I told pt that Dr.Fernandez would recommend the same as the radiologist. Pt will keep appointment

## 2014-07-19 ENCOUNTER — Encounter: Payer: Self-pay | Admitting: Gynecology

## 2014-07-20 ENCOUNTER — Ambulatory Visit (INDEPENDENT_AMBULATORY_CARE_PROVIDER_SITE_OTHER): Payer: BC Managed Care – PPO

## 2014-07-20 ENCOUNTER — Other Ambulatory Visit: Payer: Self-pay | Admitting: Gynecology

## 2014-07-20 DIAGNOSIS — M810 Age-related osteoporosis without current pathological fracture: Secondary | ICD-10-CM

## 2014-07-20 DIAGNOSIS — Z79890 Hormone replacement therapy (postmenopausal): Secondary | ICD-10-CM

## 2014-07-20 DIAGNOSIS — Z780 Asymptomatic menopausal state: Secondary | ICD-10-CM

## 2014-07-28 ENCOUNTER — Inpatient Hospital Stay: Admission: RE | Admit: 2014-07-28 | Payer: BC Managed Care – PPO | Source: Ambulatory Visit

## 2014-08-04 ENCOUNTER — Ambulatory Visit
Admission: RE | Admit: 2014-08-04 | Discharge: 2014-08-04 | Disposition: A | Discharge status: Home / Self Care | Payer: BC Managed Care – PPO | Source: Ambulatory Visit | Attending: Gynecology | Admitting: Gynecology

## 2014-08-04 DIAGNOSIS — N63 Unspecified lump in unspecified breast: Secondary | ICD-10-CM

## 2014-08-04 MED ORDER — GADOBENATE DIMEGLUMINE 529 MG/ML IV SOLN
14.0000 mL | Freq: Once | INTRAVENOUS | Status: AC | PRN
  Administered 2014-08-04: 14 mL via INTRAVENOUS

## 2014-08-05 ENCOUNTER — Other Ambulatory Visit: Payer: Self-pay | Admitting: Gynecology

## 2014-08-05 DIAGNOSIS — R928 Other abnormal and inconclusive findings on diagnostic imaging of breast: Secondary | ICD-10-CM

## 2014-08-10 ENCOUNTER — Ambulatory Visit (INDEPENDENT_AMBULATORY_CARE_PROVIDER_SITE_OTHER): Payer: BC Managed Care – PPO | Admitting: Gynecology

## 2014-08-10 ENCOUNTER — Encounter: Payer: Self-pay | Admitting: Gynecology

## 2014-08-10 VITALS — BP 128/76

## 2014-08-10 DIAGNOSIS — M810 Age-related osteoporosis without current pathological fracture: Secondary | ICD-10-CM | POA: Insufficient documentation

## 2014-08-10 DIAGNOSIS — F32A Depression, unspecified: Secondary | ICD-10-CM

## 2014-08-10 DIAGNOSIS — F329 Major depressive disorder, single episode, unspecified: Secondary | ICD-10-CM

## 2014-08-10 DIAGNOSIS — N63 Unspecified lump in breast: Secondary | ICD-10-CM

## 2014-08-10 DIAGNOSIS — N6310 Unspecified lump in the right breast, unspecified quadrant: Secondary | ICD-10-CM

## 2014-08-10 MED ORDER — ALENDRONATE SODIUM 70 MG PO TABS: 70.0000 mg | ORAL_TABLET | ORAL | Status: DC

## 2014-08-10 NOTE — Patient Instructions (Addendum)
Alendronate weekly tablets Qu es este medicamento? El ALENDRONATO reduce la prdida de calcio de los [place]. Este medicamento se utiliza para ayudar aumentar la produccin de hueso sano y prevenir la prdida de hueso en personas con osteoporosis. Tambin se [place] para tratar la enfermedad de [place]. Este medicamento puede ser utilizado para otros usos; si tiene alguna pregunta consulte con su proveedor de atencin mdica o con su farmacutico. MARCAS COMERCIALES DISPONIBLES: Fosamax Qu le debo informar a mi profesional de la salud antes de tomar este medicamento? Necesita saber si usted presenta alguno de los siguientes problemas o situaciones: -problemas estomacales, intestinales o esofgicos, tales como reflujo cido o ERGE -enfermedad dental -enfermedad renal -bajo nivel de calcio en la sangre -bajo nivel de vitamina D -problemas al tragar -problemas de estar sentado o de pie durante 30 minutos -una reaccin alrgica o inusual al alendronato, a otros medicamentos, alimentos, colorantes o conservantes -si est embarazada o buscando quedar embarazada -si est amamantando a un beb Cmo debo utilizar este medicamento? Debe tomar este medicamento exactamente como lo indicado, si no reduciri la cantidad de medicamento que su cuerpo absorber o le puede causar dao a usted mismo. Tome su dosis por va oral por la maana, despus de levantarse. No beba ni coma nada antes de tomar su medicamento. Trague su medicamento con un vaso lleno (6 a 8 onzas) de [employer]. No tome esta tableta con ningn otro tipo de lquido. No mastique ni triture la tableta. No desayune, beba ni tome ningn otro medicamento o vitaminas durante por lo menos 30 minutos despus de tomar Coca-Cola. Despus de tomar el medicamento, permanezca sentado o de pie durante por lo menos 30 minutos; no se acueste. Tome este medicamento en el mismo da cada semana. No tome su medicamento con una frecuencia mayor a la  indicada. Hable con su pediatra para informarse acerca del uso de este medicamento en nios. Puede requerir atencin especial. Sobredosis: Pngase en contacto inmediatamente con un centro toxicolgico o una sala de urgencia si usted cree que haya tomado demasiado medicamento. ATENCIN: [employer] es solo para usted. No comparta este medicamento con nadie. Qu sucede si me olvido de una dosis? Si olvida tomar su dosis, tmela a la maana siguiente de haberlo recordado. Luego vuelva a tomar sus dosis en el da de la semana originalmente. Nunca tome 2 tabletas el mismo da. No tome dosis adicionales o dobles. Qu puede interactuar con este medicamento? -hidrxido de aluminio -anticidos -aspirina -suplementos de calcio -medicamentos antiinflamatorios, tales como ibuprofeno, naproxeno, entre otros -suplementos con hierro -suplementos de magnesio -vitaminas con minerales Puede ser que esta lista no menciona todas las posibles interacciones. Informe a su profesional de [employer]	[place] de [employer] productos a base de hierbas, medicamentos de [place] o suplementos nutritivos que est tomando. Si usted fuma, consume bebidas alcohlicas o si utiliza drogas ilegales, indqueselo tambin a su profesional de KB Home	[place]. Algunas sustancias pueden interactuar con su medicamento. A qu debo estar atento al usar Coca-Cola? Visite a su mdico o a su profesional de la salud para chequeos peridicos. Puede ser necesario que transcurra cierto tiempo antes de que pueda observar los beneficios de [place]. No deje de tomar su medicamento excepto si as lo indica su mdico. Su mdico o su profesional de la salud puede pedirle anlisis de sangre u otros examenes para Physicist, medical su evolucin. Asegurarse de que su dieta incluya la cantidad necesaria de calcio y vitamina D mientras est tomando [place], a  menos que su mdico le indica lo contrario. Hable con su profesional de la salud acerca de sus  alimentos y las vitaminas que est tomando. Algunas personas que toman este medicamento experimentan dolor grave de [place], de articulaciones o/y de msculos. Este medicamento tambin puede aumentar el riesgo de un fmur roto. Informe a su mdico inmediatamente si tiene dolor en la pierna superior o la ingle. Si experimenta dolor que no desaparece o que empeora, informe a su mdico. Este medicamento puede aumentar la sensibilidad al sol. Si desarrolla un sarpullido mientras est tomando Coca-Cola, el sarpullido puede empeorar con la exposicin al sol. Mantngase fuera de Administrator. Si no lo puede evitar, utilice ropa protectora y crema de Photographer. No utilice lmparas solares, camas solares ni cabinas solares. Qu efectos secundarios puedo tener al [employer] este medicamento? Efectos secundarios que debe informar a su mdico o a Barrister's clerk de la salud tan pronto como sea posible: -Chief of Staff, tales como erupcin cutnea o picazn, urticarias, hinchazn de la cara, labios, lengua o garganta -heces de color oscuro o con aspecto alquitranado -dolores seos, musculares o articulares -cambios en la visin -dolor en el pecho -dolor o acidez de estmago -dolor de [place], especialmente despus de tratamiento dental -dolor o dificultad para tragar -enrojecimiento, formacin de ampollas, descamacin o distensin de la piel, inclusive dentro de la boca Efectos secundarios que, por lo general, no requieren atencin mdica (debe informarlos a su mdico o a su profesional de la salud si persisten o si son molestos): -cambios en el sentido del gusto -diarrea o estreimiento -dolor ocular o picazn -dolor de cabeza -nuseas, vmito -sensacin de saciedad o gases en el estmago Puede ser que esta lista no menciona todos los posibles efectos secundarios. Comunquese a su mdico por asesoramiento mdico [employer]ana Inc. Usted puede informar los efectos secundarios a  la FDA por telfono al 1-800-FDA-1088. Dnde debo guardar mi medicina? Mantngala fuera del alcance de los nios. Gurdela a temperatura ambiente de entre 15 y 88 grados C (73 y 58 grados F). Deseche todo el medicamento que no haya utilizado, despus de la fecha de vencimiento. ATENCIN: Este folleto es un resumen. Puede ser que no cubra toda la posible informacin. Si usted tiene preguntas acerca de esta medicina, consulte con su mdico, su farmacutico o su profesional de Technical sales engineer.  2015, Elsevier/Gold Standard. (2011-11-09 13:33:28) Osteoporosis  (Osteoporosis)  La osteoporosis ocurre cuando los huesos se debilitan debido a la prdida de Manufacturing systems engineer. Los huesos dbiles pueden romperse (fractura) con ms facilidad al caerse o resbalar.  Es ms probable que desarrolle osteoporosis si:   Es mujer.  Tiene ms de 50 aos.  Pertenece a la raza blanca o asitica.  Es muy delgada.  Algn miembro de su familia ha sufrido osteoporosis.  Fuma o consume nicotina. CAUSAS   El hbito de fumar.  El consumo excesivo de alcohol.  Tener un peso por debajo del normal.  No ser activa.  No tomar el sol suficiente.  Ciertas enfermedades como la diabetes o la enfermedad de Crohn.  Ciertos medicamentos, como los corticoides o los anticonvulsivos. TRATAMIENTO  El objetivo del tratamiento es fortalecer los [place]. Hay diferentes tipos de medicamentos para [employer]. Algunos medicamentos hacen que los huesos se vuelvan ms slidos. Otros medicamentos ayudan a Ambulance person prdida del hueso. El mdico le har controles para ver si usted est recibiendo suficiente calcio y vitamina D en su dieta.  [place]  suficiente calcio y vitamina D.  Haga ejercicios con frecuencia.  Si fuma, deje de fumar. ASEGRESE DE QUE:   Comprende estas instrucciones.  Controlar su enfermedad.  Solicitar ayuda de inmediato si no mejora o si empeora. Document Released:  03/04/2012 [facility] Patient Information 2015 [place]. This information is not intended to replace advice given to you by your health care provider. Make sure you discuss any questions you have with your health care provider.

## 2014-08-10 NOTE — Progress Notes (Signed)
   Patient is a 63 year old presented to the office today to discuss her recent bone density study as well as her recent mammograms and breast biopsies for which she had questions.Patient was seen the office for her annual gynecological exam on August 11 see previous note for additional details.  Bone density studies as follows: 2008 the lowest T score was -2.3 at the right femoral neck. FRAX Analysis was not done at that time  2015 (November) her lowest T score was noted to be at the left femoral neck with a value of -2.6 equivalent to being in the osteoporotic range. But total left and right hip there was no statistically significant change from previous bone density study or at the spine. Since it was just the left femoral neck the demonstrated the lowest T score she was asked to come to the office to discuss treatment. She states she is taking her calcium and vitamin D and exercises regularly. We discussed treatment options with various types of antiresorptive agents as well as monoclonal antibodies and IV infusion or IM injections. Because of limited resources I'm going to start her on Fosamax 70 mg one by mouth every weekly and recommend repeating bone density study to monitor response to treatment in one year. We discussed the potential side effects of Fosamax to include osteonecrosis of the jaw or subtrochanteric fracture. We are going to check her calcium, vitamin D and PTH level today.  We had a lengthy discussion today of her recent screening mammograms and follow-up stereotactic core biopsy of the right breast which have been inconclusive and she is scheduled for next month for MRI directed biopsy of a distorted area of the breast above from where previous biopsy had been obtained. We spent close to 20 minutes going over all the reports in Spanish and all questions were answered.  Patient is currently going through emotional issues with her daughter but states that she does not need any  medication and she hasn't under control and that there is no suicidal ideation and has good support from her husband.

## 2014-08-11 LAB — VITAMIN D 25 HYDROXY (VIT D DEFICIENCY, FRACTURES): Vit D, 25-Hydroxy: 41 ng/mL (ref 30–100)

## 2014-08-13 LAB — PTH, INTACT AND CALCIUM
Calcium: 9.5 mg/dL (ref 8.4–10.5)
PTH: 26 pg/mL (ref 14–64)

## 2014-08-19 ENCOUNTER — Ambulatory Visit
Admission: RE | Admit: 2014-08-19 | Discharge: 2014-08-19 | Disposition: A | Discharge status: Home / Self Care | Payer: BC Managed Care – PPO | Source: Ambulatory Visit | Attending: Gynecology | Admitting: Gynecology

## 2014-08-19 ENCOUNTER — Other Ambulatory Visit: Payer: Self-pay | Admitting: Gynecology

## 2014-08-19 DIAGNOSIS — R928 Other abnormal and inconclusive findings on diagnostic imaging of breast: Secondary | ICD-10-CM

## 2014-08-24 ENCOUNTER — Ambulatory Visit
Admission: RE | Admit: 2014-08-24 | Discharge: 2014-08-24 | Disposition: A | Discharge status: Home / Self Care | Payer: BC Managed Care – PPO | Source: Ambulatory Visit | Attending: Gynecology | Admitting: Gynecology

## 2014-08-24 DIAGNOSIS — R928 Other abnormal and inconclusive findings on diagnostic imaging of breast: Secondary | ICD-10-CM

## 2014-08-24 DIAGNOSIS — C50919 Malignant neoplasm of unspecified site of unspecified female breast: Secondary | ICD-10-CM

## 2014-08-24 HISTORY — DX: Malignant neoplasm of unspecified site of unspecified female breast: C50.919

## 2014-08-24 MED ORDER — GADOBENATE DIMEGLUMINE 529 MG/ML IV SOLN: 14.0000 mL | Freq: Once | INTRAVENOUS | Status: AC | PRN

## 2014-08-26 ENCOUNTER — Telehealth: Payer: Self-pay | Admitting: *Deleted

## 2014-08-26 DIAGNOSIS — Z170 Estrogen receptor positive status [ER+]: Secondary | ICD-10-CM

## 2014-08-26 DIAGNOSIS — C50211 Malignant neoplasm of upper-inner quadrant of right female breast: Secondary | ICD-10-CM

## 2014-08-26 NOTE — Telephone Encounter (Signed)
Confirmed [facility] for 09/01/14 at 12N .  Instructions and contact information given.

## 2014-09-01 ENCOUNTER — Ambulatory Visit
Admission: RE | Admit: 2014-09-01 | Discharge: 2014-09-01 | Disposition: A | Discharge status: Home / Self Care | Payer: BC Managed Care – PPO | Source: Ambulatory Visit | Attending: Radiation Oncology | Admitting: Radiation Oncology

## 2014-09-01 ENCOUNTER — Encounter: Payer: Self-pay | Admitting: Hematology

## 2014-09-01 ENCOUNTER — Ambulatory Visit (HOSPITAL_BASED_OUTPATIENT_CLINIC_OR_DEPARTMENT_OTHER): Payer: BC Managed Care – PPO | Admitting: Hematology

## 2014-09-01 ENCOUNTER — Other Ambulatory Visit (INDEPENDENT_AMBULATORY_CARE_PROVIDER_SITE_OTHER): Payer: Self-pay | Admitting: General Surgery

## 2014-09-01 ENCOUNTER — Encounter (INDEPENDENT_AMBULATORY_CARE_PROVIDER_SITE_OTHER): Payer: Self-pay

## 2014-09-01 ENCOUNTER — Other Ambulatory Visit (HOSPITAL_BASED_OUTPATIENT_CLINIC_OR_DEPARTMENT_OTHER): Payer: BC Managed Care – PPO

## 2014-09-01 ENCOUNTER — Ambulatory Visit: Payer: BC Managed Care – PPO

## 2014-09-01 VITALS — BP 115/61 | HR 81 | Temp 98.0°F | Resp 18 | Ht 62.0 in | Wt 153.2 lb

## 2014-09-01 DIAGNOSIS — C50211 Malignant neoplasm of upper-inner quadrant of right female breast: Secondary | ICD-10-CM

## 2014-09-01 DIAGNOSIS — Z170 Estrogen receptor positive status [ER+]: Secondary | ICD-10-CM

## 2014-09-01 DIAGNOSIS — E039 Hypothyroidism, unspecified: Secondary | ICD-10-CM

## 2014-09-01 DIAGNOSIS — E119 Type 2 diabetes mellitus without complications: Secondary | ICD-10-CM

## 2014-09-01 DIAGNOSIS — Z01818 Encounter for other preprocedural examination: Secondary | ICD-10-CM

## 2014-09-01 DIAGNOSIS — C50911 Malignant neoplasm of unspecified site of right female breast: Secondary | ICD-10-CM

## 2014-09-01 LAB — CBC WITH DIFFERENTIAL/PLATELET
BASO%: 0.5 % (ref 0.0–2.0)
BASOS ABS: 0 10*3/uL (ref 0.0–0.1)
EOS ABS: 0.2 10*3/uL (ref 0.0–0.5)
EOS%: 2.4 % (ref 0.0–7.0)
HCT: 39.4 % (ref 34.8–46.6)
HEMOGLOBIN: 12.9 g/dL (ref 11.6–15.9)
LYMPH#: 2.6 10*3/uL (ref 0.9–3.3)
LYMPH%: 32.7 % (ref 14.0–49.7)
MCH: 30.1 pg (ref 25.1–34.0)
MCHC: 32.6 g/dL (ref 31.5–36.0)
MCV: 92.3 fL (ref 79.5–101.0)
MONO#: 0.5 10*3/uL (ref 0.1–0.9)
MONO%: 6.9 % (ref 0.0–14.0)
NEUT#: 4.5 10*3/uL (ref 1.5–6.5)
NEUT%: 57.5 % (ref 38.4–76.8)
Platelets: 272 10*3/uL (ref 145–400)
RBC: 4.27 10*6/uL (ref 3.70–5.45)
RDW: 12.8 % (ref 11.2–14.5)
WBC: 7.9 10*3/uL (ref 3.9–10.3)

## 2014-09-01 LAB — COMPREHENSIVE METABOLIC PANEL (CC13)
ALK PHOS: 73 U/L (ref 40–150)
ALT: 26 U/L (ref 0–55)
AST: 21 U/L (ref 5–34)
Albumin: 3.9 g/dL (ref 3.5–5.0)
Anion Gap: 9 mEq/L (ref 3–11)
BILIRUBIN TOTAL: 0.35 mg/dL (ref 0.20–1.20)
BUN: 15.5 mg/dL (ref 7.0–26.0)
CO2: 27 mEq/L (ref 22–29)
Calcium: 9.9 mg/dL (ref 8.4–10.4)
Chloride: 103 mEq/L (ref 98–109)
Creatinine: 0.8 mg/dL (ref 0.6–1.1)
EGFR: 86 mL/min/{1.73_m2} — ABNORMAL LOW (ref >90)
Glucose: 114 mg/dl (ref 70–140)
Potassium: 4.3 mEq/L (ref 3.5–5.1)
SODIUM: 140 meq/L (ref 136–145)
TOTAL PROTEIN: 7.1 g/dL (ref 6.4–8.3)

## 2014-09-01 NOTE — Progress Notes (Signed)
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  Biopsy results revealed the tumor's hormone status as ER positive, PR positive, and HER2/neu negative. Ki67 is 12%.  She presents today with her daughter to the [facility]) for treatment consideration and recommendations from the breast surgeon, radiation oncologist, and medical oncologist.     I briefly met with Destiny Moody and her daughter during her [facility] visit today. We discussed the purpose of the Survivorship Clinic, which will include monitoring for recurrence, coordinating completion of age and gender-appropriate cancer screenings, promotion of overall wellness, as well as managing potential late/long-term side effects of anti-cancer treatments.    As of today, the treatment plan for Destiny Moody will likely include surgery and radiation therapy.  Anti-estrogen treatment will be considered for her as well.The intent of treatment for Destiny Moody is cure, therefore she will be eligible for the Survivorship Clinic upon her completion of treatment.  Her survivorship care plan (SCP) document will be drafted and updated throughout the course of her treatment trajectory. She will receive the SCP in an office visit with myself in the Survivorship Clinic once she has completed treatment.   Destiny Moody was encouraged to ask questions and all questions were answered to her satisfaction.  She was given my business card and encouraged to contact me with any concerns regarding survivorship.  I look forward to participating in her care.   Mike Craze, NP

## 2014-09-01 NOTE — Progress Notes (Signed)
Checked in new pt with no financial concerns at this time.  Informed pt that if chemo is part of her treatment Raquel will call her ins to see if Josem Kaufmann is req and will obtain that if it is and will also contact different foundations that offer copay assistance if needed.  She has Raquel's card for any billing or insurance questions or concerns.

## 2014-09-01 NOTE — Progress Notes (Signed)
  Radiation Oncology         [id number]) [phone] ________________________________  Initial outpatient Consultation - Date: 09/01/2014   Name: Destiny Moody MRN: [id number]   DOB: [birth year]  REFERRING PHYSICIAN: Fanny Skates, MD  DIAGNOSIS:    ICD-9-CM ICD-10-CM   1. Breast cancer of upper-inner quadrant of right female breast 174.2 C50.211     STAGE: Breast cancer of upper-inner quadrant of right female breast   Staging form: Breast, AJCC 7th Edition     Clinical stage from 09/01/2014: Stage IA (T1c, N0, M0) - Unsigned  HISTORY OF PRESENT ILLNESS::Destiny Moody is a 63 y.o. female  Who was found to have a density of the right breast on screening mammogram.A stereotactic biopsy was performed and was discordant. MRI biopsy was recommended and showed an invasive ductal carcinoma which was Grade 1-2 with associated DCIS and LVI. She was ER+PR+ HER2-. Ki67 was 12%. She has recovered well from her biopsy.  She has recovered well from her biopsy. She is accompanied by her daughter. Our spanish translator was unable to stay for this meeting.  She had menses at 12 and is post menopausal. She did not take HRT and is GxP2 with first birth at 37.   PREVIOUS RADIATION THERAPY: No  FAMILY HISTORY:  Family History  Problem Relation Age of Onset  . Diabetes Mother     niddm  . Heart disease Mother   . Heart failure Father   . Heart disease Father   . Ovarian cancer Cousin     SOCIAL HISTORY:  History  Substance Use Topics  . Smoking status: Never Smoker   . Smokeless tobacco: Never Used  . Alcohol Use: Yes     Comment: occasionally    REVIEW OF SYSTEMS:  A 15 point review of systems is documented in the electronic medical record. This was obtained by the nursing staff. However, I reviewed this with the patient to discuss relevant findings and make appropriate changes.  Pertinent positives are included in the chart.   PHYSICAL EXAM: There were no vitals filed for this visit.. . Pleasant female  in no distress. Alert and oriented x 3. I did not perform a breast examination.   IMPRESSION: T1c N0 Invasive Ductal Carcinoma of the left breast  PLAN: I spoke to the patient today regarding her diagnosis and options for treatment. We discussed the equivalence in terms of survival and local failure between mastectomy and breast conservation. We discussed the role of radiation in decreasing local failures in patients who undergo lumpectomy. We discussed the process of simulation and the placement tattoos. We discussed 4-6 weeks of treatment as an outpatient. We discussed the possibility of asymptomatic lung damage. We discussed the low likelihood of secondary malignancies. We discussed the possible side effects including but not limited to skin redness, fatigue, permanent skin darkening, and breast swelling. We discussed the process of simulation and the placement of tattoos. I will see her back after her Oncotype score. I did clarify with her that if she needed chemotherapy this would be performed prior to radiation. She met with medical oncology as well as a member of our patient family support team and our physical therapist. I will plan on seeing her back after her surgery.  I spent 40 minutes face to face with the patient and more than 50% of that time was spent in counseling and/or coordination of care.   ------------------------------------------------  Thea Silversmith, MD

## 2014-09-01 NOTE — Progress Notes (Addendum)
[place]  Telephone:([phone]) [phone] Fax:([phone]) [place] Note   Patient Care Team: Thurman Coyer, MD as PCP - General (Internal Medicine) Terrance Mass, MD as Consulting Physician (Gynecology) Fanny Skates, MD as Consulting Physician (General Surgery) Truitt Merle, MD as Consulting Physician (Hematology) Thea Silversmith, MD as Consulting Physician (Radiation Oncology) Trinda Pascal, NP as Nurse Practitioner (Nurse Practitioner) 09/01/2014  CHIEF COMPLAINTS/PURPOSE OF CONSULTATION:  Newly diagnosed Left breast IDA and DCIS     Breast cancer of upper-inner quadrant of right female breast   08/05/2014 Breast MRI 1.7cm non mass enhancement in the medial right breast   08/24/2014 Initial Biopsy IDA and DCIS, ER 100%, PR 99%, HER2 -, Ki 67% 12%.    08/26/2014 Initial Diagnosis Breast cancer of upper-inner quadrant of right female breast     HISTORY OF PRESENTING ILLNESS:  Destiny Moody 63 y.o. female presents to our breast [place] today for her newly diagnosed breast caner.   She had abnormal screening mammogram in September 2015, underwent biopsy on 06/15/2014 which was negative for malignancy. She developed I small hematoma after biopsy, which required drainage afterwards. She finally had a bilateral breast MRI , which showed a 1.7 cm non-mass enhancement in the medial right breast. She had repeated mammogram on 08/19/2014 which showed again non-mass like enhancement in the right breast which was biopsied before. 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 She tolerates the biopsy well without any complications.  She feels well overall. She denies any new symptoms lately. She owns a grocery store, is physically very active. She has good appetite and energy level, no recent weight loss.  MEDICAL HISTORY:  Past Medical History  Diagnosis Date  . Hypothyroidism   . Rheumatoid  arthritis(714.0)   . Osteopenia   . NIDDM (non-insulin dependent diabetes mellitus)   . Multinodular thyroid   . Hypercholesterolemia   . HSV infection   . Osteoporosis 2015    SURGICAL HISTORY: Past Surgical History  Procedure Laterality Date  . Cholecystectomy  1993  . Tubal ligation  1996    re annastomosis  . Cesarean section  [id number]    SOCIAL HISTORY: History   Social History  . Marital Status: Married    Spouse Name: Arnoldo    Number of Children: 2  . Years of Education: College   Occupational History  .      Self Employed   Social History Main Topics  . Smoking status: Never Smoker   . Smokeless tobacco: Never Used  . Alcohol Use: Yes     Comment: occasionally  . Drug Use: No  . Sexual Activity: Not on file   Other Topics Concern  . Not on file   Social History Narrative   Patient lives at home with her spouse.   Caffeine use: 1 cup daily   GYN HISTORY  Menarchal: 12 LMP: 42 Contraceptive: 1 yr HRT: no  G2P2:   FAMILY HISTORY: Family History  Problem Relation Age of Onset  . Diabetes Mother     niddm  . Heart disease Mother   . Heart failure Father   . Heart disease Father   . Ovarian cancer Cousin     ALLERGIES:  has No Known Allergies.  MEDICATIONS:  Current Outpatient Prescriptions  Medication Sig Dispense Refill  . aspirin 81 MG tablet Take 81 mg by mouth daily.    . calcium carbonate (OS-CAL) 600 MG  TABS tablet Take 600 mg by mouth daily with breakfast.     . Exenatide (BYDUREON [place]) Inject into the skin once a week.     Marland Kitchen glimepiride (AMARYL) 4 MG tablet Take 4 mg by mouth daily with breakfast.     . lisinopril (PRINIVIL,ZESTRIL) 2.5 MG tablet Take 2.5 mg by mouth daily.      . metFORMIN (GLUMETZA) 1000 MG (MOD) 24 hr tablet Take 1,000 mg by mouth 2 (two) times daily with a meal.     . alendronate (FOSAMAX) 70 MG tablet Take 1 tablet (70 mg total) by mouth once a week. Take with a full glass of water on an empty stomach.  (Patient not taking: Reported on 09/01/2014) 30 tablet 11  . gabapentin (NEURONTIN) 100 MG capsule Take 1 pill nightly at bedtime for 1 week, then 2 pills nightly for 1 week, then 3 pills each night thereafter. (Patient not taking: Reported on 09/01/2014) 90 capsule 3  . Glucosamine 750 MG TABS Take by mouth.    . NONFORMULARY OR COMPOUNDED ITEM Estradiol 0.02 % 57m prefilled applicator Sig: apply twice a week (Patient not taking: Reported on 09/01/2014) 24 each 4  . pioglitazone-metformin (ACTOPLUS MET) 15-500 MG per tablet Take 1 tablet by mouth 2 (two) times daily with a meal.    . simvastatin (ZOCOR) 20 MG tablet Take 20 mg by mouth at bedtime.       No current facility-administered medications for this visit.    REVIEW OF SYSTEMS:   Constitutional: Denies fevers, chills or abnormal night sweats Eyes: Denies blurriness of vision, double vision or watery eyes Ears, nose, mouth, throat, and face: Denies mucositis or sore throat Respiratory: Denies cough, dyspnea or wheezes Cardiovascular: Denies palpitation, chest discomfort or lower extremity swelling Gastrointestinal:  Denies nausea, heartburn or change in bowel habits Skin: Denies abnormal skin rashes Lymphatics: Denies new lymphadenopathy or easy bruising Neurological:Denies numbness, tingling or new weaknesses Behavioral/Psych: Mood is stable, no new changes  All other systems were reviewed with the patient and are negative.  PHYSICAL EXAMINATION: ECOG PERFORMANCE STATUS: 0 - Asymptomatic  Filed Vitals:   09/01/14 1308  BP: 115/61  Pulse: 81  Temp: 98 F (36.7 C)  Resp: 18   Filed Weights   09/01/14 1308  Weight: 153 lb 3.2 oz (69.491 kg)    GENERAL:alert, no distress and comfortable SKIN: skin color, texture, turgor are normal, no rashes or significant lesions EYES: normal, conjunctiva are pink and non-injected, sclera clear OROPHARYNX:no exudate, no erythema and lips, buccal mucosa, and tongue normal  NECK:  supple, thyroid normal size, non-tender, without nodularity LYMPH:  no palpable lymphadenopathy in the cervical, axillary or inguinal LUNGS: clear to auscultation and percussion with normal breathing effort HEART: regular rate & rhythm and no murmurs and no lower extremity edema ABDOMEN:abdomen soft, non-tender and normal bowel sounds Musculoskeletal:no cyanosis of digits and no clubbing  PSYCH: alert & oriented x 3 with fluent speech NEURO: no focal motor/sensory deficits Breasts: Breast inspection showed them to be symmetrical with no nipple discharge. (+) Skin bruising at the right upper outer quadrant of her breast, and fullness at the biopsy site. Palpation of the left breasts and axilla revealed no obvious mass that I could appreciate.   LABORATORY DATA:  I have reviewed the data as listed Lab Results  Component Value Date   WBC 7.9 09/01/2014   HGB 12.9 09/01/2014   HCT 39.4 09/01/2014   MCV 92.3 09/01/2014   PLT 272 09/01/2014  Recent Labs  08/10/14 1608 09/01/14 1221  NA  --  140  K  --  4.3  CO2  --  27  GLUCOSE  --  114  BUN  --  15.5  CREATININE  --  0.8  CALCIUM 9.5 9.9  PROT  --  7.1  ALBUMIN  --  3.9  AST  --  21  ALT  --  26  ALKPHOS  --  73  BILITOT  --  0.35   PATHOLOGY REPORT 08/24/2014 Breast, right, needle core biopsy, UIQ - INVASIVE DUCTAL CARCINOMA. - DUCTAL CARCINOMA IN SITU. - LYMPHOVASCULAR INVASION IS IDENTIFIED. - SEE COMMENT. Microscopic Comment  Estrogen Receptor: 100%, POSITIVE, STRONG STAINING INTENSITY Progesterone Receptor: 99%, POSITIVE, STRONG STAINING INTENSITY Proliferation Marker Ki67: 12% HER-2/NEU BY CISH - NO AMPLIFICATION OF HER-2 DETECTED. RESULT RATIO OF HER2: CEP 17 SIGNALS 0.91  RADIOGRAPHIC STUDIES: I have personally reviewed the radiological images as listed and agreed with the findings in the report.  Mr Breast Bilateral W Wo Contrast 08/05/2014    IMPRESSION:  1. Post biopsy changes in the medial right  breast at posterior depth. A 1.7 cm AP diameter area of linear clumped non mass enhancement is noted superior and lateral to the biopsy site which represents a good MR correlate to the previously noted mammographic finding.  2. No MR findings of malignancy in the left breast.  3. No adenopathy.    Mammogram and [place] 06/12/2014 IMPRESSION: 1. Persistent asymmetry with mild distortion in the medial aspect of the right breast for which biopsy is recommended. 2. No sonographic correlate is identified for this finding. Stereotactic guided core biopsy is recommended.  ASSESSMENT & PLAN:   63 year old postmenopausal woman with past medical history of diabetes, hypothyroidism, rheumatoid arthritis, who was recently found to have a early stage of right breast cancer.  1. Right breast invasive ductal carcinoma, T1 cN0 M0, stage IA. ER 100% positive, PR 99% positive, HER-2 negative, Ki-67 12%. -She was by our surgeon Dr. Dalbert Batman today, and most likely will have lumpectomy and sentinel lymph node biopsy.  -I recommend adjuvant endocrine therapy with aromatase inhibitor, such as Aromasin, to reduce her risks of cancer recurrence after surgery. -I discussed the role of adjuvant chemotherapy in stage I breast cancer. Although she has multiple comorbid, she is relatively young with good performance status, would be a candidate for adjuvant chemotherapy if needed. I discussed the role of Oncotype test to predict her risk of cancer recurrence, given her ER/PR positive node-negative disease, I recommend her to have Oncotype test from her surgical sample. If her Oncotype recurrence score is high, I would recommend chemotherapy, likely 4 cycles of Cytoxan and docetaxel. -She is likely also would have adjuvant radiation after lumpectomy.  2. Right breast DCIS -Right lumpectomy and adjuvant aromatase inhibitor.   3. Dm, RA, hypothyroidism -She will continue to follow-up with her primary care physician  Plan -She is  likely going to have right lumpectomy. If her surgical path and staging remains unchanged, I will send her surgical sample out for Oncotype test. -Return to my clinic in 3 weeks after her surgery to discuss Oncotype results and decision about Adjuvant chemotherapy -Start adjuvant Aromasin for 5 years after she completes adjuvant irradiation.   All questions were answered. The patient knows to call the clinic with any problems, questions or concerns. I spent 40 minutes counseling the patient face to face. The total time spent in the appointment was 60 minutes and more than 50%  was on counseling.     Truitt Merle, MD 09/01/2014 6:37 PM

## 2014-09-06 ENCOUNTER — Telehealth: Payer: Self-pay | Admitting: *Deleted

## 2014-09-06 NOTE — Telephone Encounter (Signed)
Spoke with patient from [facility] 09/01/14.  She is doing well.  Encouraged her to call with any needs or concerns.

## 2014-09-08 ENCOUNTER — Encounter: Payer: Self-pay | Admitting: General Practice

## 2014-09-08 NOTE — Progress Notes (Signed)
[place] Psychosocial Distress Screening Spiritual Care Met with Destiny Moody and dtr Destiny Moody at breast clinic to introduce [place] team/resources and to review distress screen per protocol.  The patient scored a 1 on the Psychosocial Distress Thermometer which indicates mild distress. Also assessed for distress and other psychosocial needs.   ONCBCN DISTRESS SCREENING 09/08/2014  Screening Type Initial Screening  Distress experienced in past week (1-10) 0  Practical problem type Insurance;Work/school  Emotional problem type Adjusting to illness  Spiritual/Religous concerns type Relating to God  Information Concerns Type Lack of info about diagnosis;Lack of info about treatment;Lack of info about complementary therapy choices  Referral to support programs Yes  Other Destiny Moody shared that she is originally from [place] and has lived in the [place], sharing and processing stories about people's stereotypes and snap judgments of her.  She was appreciative of opportunity to be heard and met as an individual person. She also used opportunity to process her experience of anxiety-producing service during early diagnostic care (prior to [facility]).  She reported decreased stress upon learning more about dx/tx plans and support resources available at [facility].  Pt reports that her concern of "relating to God" is related to lack of culturally and linguistically sensitive leadership in her church.  Provided pastoral presence, reflective listening, normalization/affirmation of feelings, and emotional support.  Family aware of ongoing chaplain/Support Center team availability and contact info, but please also page as needs arise.  Thank you.  Follow up needed: No.  [place], [name]

## 2014-09-17 HISTORY — PX: BREAST LUMPECTOMY: SHX2

## 2014-09-27 ENCOUNTER — Telehealth: Payer: Self-pay | Admitting: *Deleted

## 2014-09-27 NOTE — Telephone Encounter (Signed)
Spoke with patient today concerning her referral from her primary MD to [facility].  Per [facility] she has to call her primary MD and get a referral per her insurance.  Patient states she called last week and Dr. Collier Flowers office was supposed to call and make the referral.  Left Debbie at [facility] a message to follow up.

## 2014-09-29 ENCOUNTER — Ambulatory Visit (INDEPENDENT_AMBULATORY_CARE_PROVIDER_SITE_OTHER): Payer: 59 | Admitting: Family Medicine

## 2014-09-29 ENCOUNTER — Ambulatory Visit (INDEPENDENT_AMBULATORY_CARE_PROVIDER_SITE_OTHER): Payer: 59

## 2014-09-29 VITALS — BP 108/62 | HR 85 | Temp 98.4°F | Resp 18 | Ht 62.5 in | Wt 154.0 lb

## 2014-09-29 DIAGNOSIS — C50912 Malignant neoplasm of unspecified site of left female breast: Secondary | ICD-10-CM

## 2014-09-29 DIAGNOSIS — E119 Type 2 diabetes mellitus without complications: Secondary | ICD-10-CM

## 2014-09-29 DIAGNOSIS — Z01818 Encounter for other preprocedural examination: Secondary | ICD-10-CM

## 2014-09-29 NOTE — Patient Instructions (Signed)
We will refer you to endocrinologist and Dr. Dalbert Batman as discussed. If forms needed prior to your surgery - let me know.  Plan on follow up with me in next 3-6 months for a physical, but if medication refills needed prior to that time - let me know to determine if office visit needed or can possibly call them in.  Return to the clinic or go to the nearest emergency room if any of your symptoms worsen or new symptoms occur.

## 2014-09-29 NOTE — Progress Notes (Addendum)
Subjective:    Patient ID: Destiny Moody, female    DOB: [birth year], 64 y.o.   MRN: [id number] This chart was scribed for [name], MD by Marti Sleigh, Medical Scribe. This patient was seen in Room 10 and the patient's care was started a 4:56 PM.  Chief Complaint  Patient presents with  . Medical Clearance    breast cancer     HPI HPI Comments: Destiny Moody is a 64 y.o. female with a HX of DM, and recent breast cancer diagnosis who presents to [facility] for an office visit. Pt states that she wanted to meet Dr. Carlota Raspberry because she needs a new PCP, and her insurance has told her that [facility] was her new care provider. Pt also states she needs a referral to [facility] as well as a referral to an endocrinologist in [place]. Pt states she was diagnosed with Breast cancer in November, 2015. Pt states she has seen her endocrineologist every six months, and her last A1C was 6.1. Pt's DM is followed by Dr. Quentin Ore in [place]. Dr. Cathe Mons is her surgeon. Pt states her previous PCP was Dr. Thea Silversmith, for the last six months.   Pt is seeing Dr. Melvyn Neth today for medical clearance. Pt was diagnosed with T1c N0 Invasive Ductal Carcinoma of the left breast in November, 2015. Most recent eval with Dr. Jenkins Rouge with the breast clinic was on December 16th. That was where mastectomy and breast conservation were discussed. Pt states she gets a shot once per week (exenatide) for her DM and takes Amaryl, metformin, and Actos. Pt states her blood sugar dropped to 50 and 60 twice in the last six months when she was emotionally distressed.   Pre-operative Clearance: Pt denies lung problems, abnormal bleeding, or diagnosis of bleeding disorder. Pt states she was under anesthesia many years ago without issue. Pt does not smoke. Pt has never been anemic. Pt had a normal CBC on December 16th, and normal CMP except abnormal EGFR86. Pt states she has a past hx of TIA, with MRI completed that showed small  vesicle changes. Pt denies kidney issues. Pt is not on insulin. Pt denies CP or SOB walking up a flight of stairs, or with physical exercise. Pt states she works on her feel all day long. Pt has no hx of heart problems. Pt has has family hx of heart issues (father, who died at 60 from a MI). Pt has never had a cardiac stress test. Pt states she exercises occasionally. Pt states she received an EKG 2-3 years ago, which she believes was normal.    Patient Active Problem List   Diagnosis Date Noted  . Breast cancer of upper-inner quadrant of right female breast 08/26/2014  . Osteoporosis 08/10/2014  . Vaginal atrophy 04/27/2014  . RLQ PAIN 04/08/2008  . GERD 04/07/2008  . FATTY LIVER DISEASE 04/07/2008   Past Medical History  Diagnosis Date  . Hypothyroidism   . Rheumatoid arthritis(714.0)   . Osteopenia   . NIDDM (non-insulin dependent diabetes mellitus)   . Multinodular thyroid   . Hypercholesterolemia   . HSV infection   . Osteoporosis 2015  . Cancer    Past Surgical History  Procedure Laterality Date  . Cholecystectomy  1993  . Tubal ligation  1996    re annastomosis  . Cesarean section  [id number]   No Known Allergies Prior to Admission medications   Medication Sig Start Date End Date Taking? Authorizing Provider  amitriptyline (ELAVIL) 25 MG tablet Take 25  mg by mouth at bedtime.   Yes Historical Provider, MD  Exenatide ER 2 MG PEN Inject 2 mg into the skin once a week.   Yes Historical Provider, MD  fluticasone (FLOVENT DISKUS) 50 MCG/BLIST diskus inhaler Inhale 1 puff into the lungs 2 (two) times daily.   Yes Historical Provider, MD  glimepiride (AMARYL) 4 MG tablet Take 4 mg by mouth daily with breakfast.    Yes Historical Provider, MD  Glucosamine 750 MG TABS Take by mouth.   Yes Historical Provider, MD  lisinopril (PRINIVIL,ZESTRIL) 2.5 MG tablet Take 2.5 mg by mouth daily.     Yes Historical Provider, MD  metFORMIN (GLUMETZA) 1000 MG (MOD) 24 hr tablet Take 1,000 mg  by mouth 2 (two) times daily with a meal.    Yes Historical Provider, MD  pioglitazone (ACTOS) 30 MG tablet Take 30 mg by mouth daily.   Yes Historical Provider, MD  simvastatin (ZOCOR) 20 MG tablet Take 20 mg by mouth at bedtime.     Yes Historical Provider, MD  alendronate (FOSAMAX) 70 MG tablet Take 1 tablet (70 mg total) by mouth once a week. Take with a full glass of water on an empty stomach. Patient not taking: Reported on 09/01/2014 08/10/14   Terrance Mass, MD  aspirin 81 MG tablet Take 81 mg by mouth daily.    Historical Provider, MD  NONFORMULARY OR COMPOUNDED ITEM Estradiol 0.02 % 24m prefilled applicator Sig: apply twice a week Patient not taking: Reported on 09/01/2014 04/27/14   JTerrance Mass MD   History   Social History  . Marital Status: Married    Spouse Name: Arnoldo    Number of Children: 2  . Years of Education: College   Occupational History  .      Self Employed   Social History Main Topics  . Smoking status: Never Smoker   . Smokeless tobacco: Never Used  . Alcohol Use: Yes     Comment: occasionally  . Drug Use: No  . Sexual Activity: Not on file   Other Topics Concern  . Not on file   Social History Narrative   Patient lives at home with her spouse.   Caffeine use: 1 cup daily     Review of Systems  Constitutional: Negative for fever and chills.  Respiratory: Negative for cough and shortness of breath.   Cardiovascular: Negative for chest pain.       Objective:   Physical Exam  Constitutional: She is oriented to person, place, and time. She appears well-developed and well-nourished.  HENT:  Head: Normocephalic and atraumatic.  Eyes: Conjunctivae and EOM are normal. Pupils are equal, round, and reactive to light.  Neck: No JVD present. Carotid bruit is not present.  Cardiovascular: Normal rate, regular rhythm, normal heart sounds and intact distal pulses.   Pulmonary/Chest: Effort normal and breath sounds normal. No respiratory  distress.  Abdominal: Soft. She exhibits no pulsatile midline mass. There is no tenderness.  Neurological: She is alert and oriented to person, place, and time.  Skin: Skin is warm and dry.  Psychiatric: She has a normal mood and affect. Her behavior is normal.  Nursing note and vitals reviewed.   Filed Vitals:   09/29/14 1610  BP: 108/62  Pulse: 85  Temp: 98.4 F (36.9 C)  TempSrc: Oral  Resp: 18  Height: 5' 2.5" (1.588 m)  Weight: 154 lb (69.854 kg)  SpO2: 98%  [facility] reading (PRIMARY) by  Dr. GCarlota Raspberry CXR: NAD, no  concerning findings.   EKG: SR, no acute findings.     Assessment & Plan:   Destiny Moody is a 64 y.o. female Preoperative clearance - Plan: EKG 12-Lead, DG Chest 2 View  - not sure if will need form completed, but based on low risk surgery and review of other PMH, as well as able to achieve 4-49mts of activity, no apparent increased risk with surgery. Borderline eGFR - maintain hydration postoperatively.   Breast cancer, left breast - Plan: Ambulatory referral to General Surgery - where seen prior.   DM - controlled. - referred to local endocrinologist as new insurance requirements.   Health Maintenance - will schedule CPE with me in next few months.   Meds ordered this encounter  Medications  . Exenatide ER 2 MG PEN    Sig: Inject 2 mg into the skin once a week.  . fluticasone (FLOVENT DISKUS) 50 MCG/BLIST diskus inhaler    Sig: Inhale 1 puff into the lungs 2 (two) times daily.  .Marland Kitchenamitriptyline (ELAVIL) 25 MG tablet    Sig: Take 25 mg by mouth at bedtime.  . pioglitazone (ACTOS) 30 MG tablet    Sig: Take 30 mg by mouth daily.   Patient Instructions  We will refer you to endocrinologist and Dr. IDalbert Batmanas discussed. If forms needed prior to your surgery - let me know.  Plan on follow up with me in next 3-6 months for a physical, but if medication refills needed prior to that time - let me know to determine if office visit needed or can possibly call them in.    Return to the clinic or go to the nearest emergency room if any of your symptoms worsen or new symptoms occur.

## 2014-09-30 NOTE — Progress Notes (Signed)
CPE set up on 01/17/15 at 1:30 with Dr. Carlota Raspberry.

## 2014-10-04 ENCOUNTER — Encounter: Payer: Self-pay | Admitting: *Deleted

## 2014-10-05 ENCOUNTER — Telehealth: Payer: Self-pay | Admitting: Hematology

## 2014-10-05 NOTE — Telephone Encounter (Signed)
s.w. pt and avised on Feb 2016 appt...pt ok and aware

## 2014-10-06 ENCOUNTER — Encounter (HOSPITAL_BASED_OUTPATIENT_CLINIC_OR_DEPARTMENT_OTHER): Payer: Self-pay | Admitting: *Deleted

## 2014-10-06 NOTE — Progress Notes (Signed)
To come for seeds 1/22-then here for labs

## 2014-10-10 NOTE — H&P (Signed)
Destiny Moody  Location: [facility] Patient #: [id number] DOB: 23-Jul-1951 Undefined / Language: Undefined / Race: Undefined Female      History of Present Illness  The patient is a 64 year old female who presents with breast cancer. This is a 40 his-year-old Hispanic female, referred by Dr. Norva Pavlov at the Breast Ctr[facility] for evaluation and management of a newly diagnosed invasive cancer of the right breast She is here with her daughter and a Engineer, structural. She understands English fairly well. Past history reveals a right breast biopsy for benign disease Screening mammograms showed a small density in the upper inner right breast. Image guided biopsy revealed a benign process and was felt to be discordant. A large hematoma formed and they waited 2 months and then brought her back in November for further imaging. Mammograms and MRI showed a 1.7 cm clumped area of non-mass enhancement in the right breast, slightly superior slightly lateral to the old biopsy site. The metallic clip was not present and was felt to have been extruded. 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 Comorbidities include hyperlipidemia, non-insulin-dependent diabetes, history of C-section and laparotomy Family history positive for ovarian cancer in a first cousin. No breast cancer. she is married and has 2 children. 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 We discussed management options at length, including lumpectomy, mastectomy, sentinel node biopsy, reconstruction. She is strongly motivated for breast conservation surgery. She will be scheduled for right partial mastectomy with radioactive seed localization and right axillary sentinel node biopsy. I discussed the indications, details, techniques, and numerous risk of the surgery with her. She's aware the risk of bleeding, infection, cosmetic deformity, reoperation for positive  margins, arm swelling, arm numbness, and other unforeseen problems. All of her questions are answered. She understands all these issues well. She agrees with this plan.   Physical Exam  General Mental Status-Alert. General Appearance-Consistent with stated age. Hydration-Well hydrated. Voice-Normal.  Head and Neck Head-normocephalic, atraumatic with no lesions or palpable masses. Trachea-midline. Thyroid Gland Characteristics - normal size and consistency.  Eye Eyeball - Bilateral-Extraocular movements intact. Sclera/Conjunctiva - Bilateral-No scleral icterus.  Chest and Lung Exam Chest and lung exam reveals -quiet, even and easy respiratory effort with no use of accessory muscles and on auscultation, normal breath sounds, no adventitious sounds and normal vocal resonance. Inspection Chest Wall - Normal. Back - normal.  Breast Breast - Left-Symmetric, Non Tender, No Biopsy scars, no Dimpling, No Inflammation, No Lumpectomy scars, No Mastectomy scars, No Peau d' Orange. Breast - Right-Symmetric and Biopsy scar, Non Tender, No Dimpling, No Inflammation, No Lumpectomy scars, No Mastectomy scars, No Peau d' Orange. Breast Lump-No Palpable Breast Mass. Note: right breast reveals a couple of puncture wounds from previous image guided biopsies. A little bruising. No axillary adenopathy in either breast. No axillary adenopathy in either breast.   Cardiovascular Cardiovascular examination reveals -normal heart sounds, regular rate and rhythm with no murmurs and normal pedal pulses bilaterally.  Abdomen Inspection Inspection of the abdomen reveals - No Hernias. Palpation/Percussion Palpation and Percussion of the abdomen reveal - Soft, Non Tender, No Rebound tenderness, No Rigidity (guarding) and No hepatosplenomegaly. Auscultation Auscultation of the abdomen reveals - Bowel sounds normal. Note: well-healed Pfannensteil incision Well-healed lower midline  incision.   Neurologic Neurologic evaluation reveals -alert and oriented x 3 with no impairment of recent or remote memory. Mental Status-Normal.  Musculoskeletal Normal Exam - Left-Upper Extremity Strength Normal  and Lower Extremity Strength Normal. Normal Exam - Right-Upper Extremity Strength Normal and Lower Extremity Strength Normal.  Lymphatic Head & Neck  General Head & Neck Lymphatics: Bilateral - Description - Normal. Axillary  General Axillary Region: Bilateral - Description - Normal. Tenderness - Non Tender. Femoral & Inguinal  Generalized Femoral & Inguinal Lymphatics: Bilateral - Description - Normal. Tenderness - Non Tender.    Assessment & Plan  PRIMARY CANCER OF UPPER INNER QUADRANT OF RIGHT FEMALE BREAST (174.2  C50.211) Current Plans  Schedule for Surgery You have recently been diagnosed with a small invasive cancer of the right breast, centrally. We have discussed your surgical options, including lumpectomy, mastectomy, sentinel node biopsy and reconstruction. After a lengthy discussion we have together decided that we will proceed with a right partial mastectomy with radioactive seed localization, and right axillary sentinel node biopsy We have discussed the techniques and risk of the surgery in detail Dr. Darrel Hoover office will call you tomorrow to begin the scheduling process  DIABETES TYPE 2, CONTROLLED (250.00  E11.9) HYPERTENSION, BENIGN (401.1  I10)   Destiny Moody, M.D., [facility] General and Minimally invasive Surgery Breast and Colorectal Surgery Office:   [phone] Pager:   [phone]

## 2014-10-11 ENCOUNTER — Encounter (HOSPITAL_BASED_OUTPATIENT_CLINIC_OR_DEPARTMENT_OTHER)
Admission: RE | Admit: 2014-10-11 | Discharge: 2014-10-11 | Disposition: A | Discharge status: Home / Self Care | Payer: 59 | Source: Ambulatory Visit | Attending: General Surgery | Admitting: General Surgery

## 2014-10-11 ENCOUNTER — Ambulatory Visit
Admission: RE | Admit: 2014-10-11 | Discharge: 2014-10-11 | Disposition: A | Discharge status: Home / Self Care | Payer: 59 | Source: Ambulatory Visit | Attending: General Surgery | Admitting: General Surgery

## 2014-10-11 DIAGNOSIS — E039 Hypothyroidism, unspecified: Secondary | ICD-10-CM | POA: Diagnosis not present

## 2014-10-11 DIAGNOSIS — C50211 Malignant neoplasm of upper-inner quadrant of right female breast: Secondary | ICD-10-CM | POA: Diagnosis present

## 2014-10-11 DIAGNOSIS — E119 Type 2 diabetes mellitus without complications: Secondary | ICD-10-CM | POA: Diagnosis not present

## 2014-10-11 DIAGNOSIS — K219 Gastro-esophageal reflux disease without esophagitis: Secondary | ICD-10-CM | POA: Diagnosis not present

## 2014-10-11 LAB — CBC WITH DIFFERENTIAL/PLATELET
Basophils Absolute: 0 10*3/uL (ref 0.0–0.1)
Basophils Relative: 0 % (ref 0–1)
Eosinophils Absolute: 0.1 10*3/uL (ref 0.0–0.7)
Eosinophils Relative: 2 % (ref 0–5)
HEMATOCRIT: 39.6 % (ref 36.0–46.0)
HEMOGLOBIN: 13.6 g/dL (ref 12.0–15.0)
Lymphocytes Relative: 37 % (ref 12–46)
Lymphs Abs: 3.1 10*3/uL (ref 0.7–4.0)
MCH: 31.3 pg (ref 26.0–34.0)
MCHC: 34.3 g/dL (ref 30.0–36.0)
MCV: 91.2 fL (ref 78.0–100.0)
Monocytes Absolute: 0.5 10*3/uL (ref 0.1–1.0)
Monocytes Relative: 6 % (ref 3–12)
NEUTROS PCT: 55 % (ref 43–77)
Neutro Abs: 4.7 10*3/uL (ref 1.7–7.7)
Platelets: 295 10*3/uL (ref 150–400)
RBC: 4.34 MIL/uL (ref 3.87–5.11)
RDW: 12.7 % (ref 11.5–15.5)
WBC: 8.4 10*3/uL (ref 4.0–10.5)

## 2014-10-11 LAB — COMPREHENSIVE METABOLIC PANEL
ALK PHOS: 80 U/L (ref 39–117)
ALT: 26 U/L (ref 0–35)
AST: 26 U/L (ref 0–37)
Albumin: 4.1 g/dL (ref 3.5–5.2)
Anion gap: 8 (ref 5–15)
BUN: 11 mg/dL (ref 6–23)
CALCIUM: 9.9 mg/dL (ref 8.4–10.5)
CHLORIDE: 100 mmol/L (ref 96–112)
CO2: 28 mmol/L (ref 19–32)
Creatinine, Ser: 0.62 mg/dL (ref 0.50–1.10)
GFR calc Af Amer: >90 mL/min (ref >90)
GFR calc non Af Amer: >90 mL/min (ref >90)
GLUCOSE: 76 mg/dL (ref 70–99)
POTASSIUM: 3.9 mmol/L (ref 3.5–5.1)
Sodium: 136 mmol/L (ref 135–145)
Total Bilirubin: 0.4 mg/dL (ref 0.3–1.2)
Total Protein: 7.5 g/dL (ref 6.0–8.3)

## 2014-10-12 ENCOUNTER — Ambulatory Visit (HOSPITAL_BASED_OUTPATIENT_CLINIC_OR_DEPARTMENT_OTHER)
Admission: RE | Admit: 2014-10-12 | Discharge: 2014-10-12 | Disposition: A | Discharge status: Home / Self Care | Payer: 59 | Source: Ambulatory Visit | Attending: General Surgery | Admitting: General Surgery

## 2014-10-12 ENCOUNTER — Encounter (HOSPITAL_COMMUNITY)
Admission: RE | Admit: 2014-10-12 | Discharge: 2014-10-12 | Disposition: A | Discharge status: Home / Self Care | Payer: 59 | Source: Ambulatory Visit | Attending: General Surgery | Admitting: General Surgery

## 2014-10-12 ENCOUNTER — Ambulatory Visit (HOSPITAL_BASED_OUTPATIENT_CLINIC_OR_DEPARTMENT_OTHER): Payer: 59 | Admitting: Anesthesiology

## 2014-10-12 ENCOUNTER — Encounter (HOSPITAL_BASED_OUTPATIENT_CLINIC_OR_DEPARTMENT_OTHER)
Admission: RE | Disposition: A | Discharge status: Home / Self Care | Payer: Self-pay | Source: Ambulatory Visit | Attending: General Surgery

## 2014-10-12 ENCOUNTER — Encounter (HOSPITAL_BASED_OUTPATIENT_CLINIC_OR_DEPARTMENT_OTHER): Payer: Self-pay | Admitting: *Deleted

## 2014-10-12 ENCOUNTER — Ambulatory Visit
Admission: RE | Admit: 2014-10-12 | Discharge: 2014-10-12 | Disposition: A | Discharge status: Home / Self Care | Payer: 59 | Source: Ambulatory Visit | Attending: General Surgery | Admitting: General Surgery

## 2014-10-12 DIAGNOSIS — Z170 Estrogen receptor positive status [ER+]: Secondary | ICD-10-CM

## 2014-10-12 DIAGNOSIS — C50211 Malignant neoplasm of upper-inner quadrant of right female breast: Secondary | ICD-10-CM | POA: Insufficient documentation

## 2014-10-12 DIAGNOSIS — C50911 Malignant neoplasm of unspecified site of right female breast: Secondary | ICD-10-CM

## 2014-10-12 DIAGNOSIS — E039 Hypothyroidism, unspecified: Secondary | ICD-10-CM | POA: Insufficient documentation

## 2014-10-12 DIAGNOSIS — Z01818 Encounter for other preprocedural examination: Secondary | ICD-10-CM

## 2014-10-12 DIAGNOSIS — K219 Gastro-esophageal reflux disease without esophagitis: Secondary | ICD-10-CM | POA: Insufficient documentation

## 2014-10-12 DIAGNOSIS — E119 Type 2 diabetes mellitus without complications: Secondary | ICD-10-CM | POA: Insufficient documentation

## 2014-10-12 HISTORY — DX: Presence of spectacles and contact lenses: Z97.3

## 2014-10-12 HISTORY — PX: RADIOACTIVE SEED GUIDED PARTIAL MASTECTOMY WITH AXILLARY SENTINEL LYMPH NODE BIOPSY: SHX6520

## 2014-10-12 LAB — GLUCOSE, CAPILLARY
GLUCOSE-CAPILLARY: 136 mg/dL — AB (ref 70–99)
Glucose-Capillary: 127 mg/dL — ABNORMAL HIGH (ref 70–99)

## 2014-10-12 SURGERY — RADIOACTIVE SEED GUIDED PARTIAL MASTECTOMY WITH AXILLARY SENTINEL LYMPH NODE BIOPSY
Anesthesia: Regional | Site: Breast | Laterality: Right

## 2014-10-12 MED ORDER — BUPIVACAINE-EPINEPHRINE (PF) 0.25% -1:200000 IJ SOLN
INTRAMUSCULAR | Status: AC
  Filled 2014-10-12: qty 30

## 2014-10-12 MED ORDER — DEXAMETHASONE SODIUM PHOSPHATE 4 MG/ML IJ SOLN
INTRAMUSCULAR | Status: DC | PRN
  Administered 2014-10-12: 10 mg via INTRAVENOUS

## 2014-10-12 MED ORDER — FENTANYL CITRATE 0.05 MG/ML IJ SOLN
INTRAMUSCULAR | Status: DC | PRN
  Administered 2014-10-12 (×2): 50 ug via INTRAVENOUS

## 2014-10-12 MED ORDER — FENTANYL CITRATE 0.05 MG/ML IJ SOLN
INTRAMUSCULAR | Status: AC
  Filled 2014-10-12: qty 2

## 2014-10-12 MED ORDER — TECHNETIUM TC 99M SULFUR COLLOID FILTERED
1.0000 | Freq: Once | INTRAVENOUS | Status: AC | PRN
  Administered 2014-10-12: 1 via INTRADERMAL

## 2014-10-12 MED ORDER — FENTANYL CITRATE 0.05 MG/ML IJ SOLN
50.0000 ug | INTRAMUSCULAR | Status: DC | PRN
Start: 2014-10-12 — End: 2014-10-12
  Administered 2014-10-12: 100 ug via INTRAVENOUS

## 2014-10-12 MED ORDER — FENTANYL CITRATE 0.05 MG/ML IJ SOLN
INTRAMUSCULAR | Status: AC
  Filled 2014-10-12: qty 6

## 2014-10-12 MED ORDER — HYDROMORPHONE HCL 1 MG/ML IJ SOLN
0.2500 mg | INTRAMUSCULAR | Status: DC | PRN
  Administered 2014-10-12 (×3): 0.5 mg via INTRAVENOUS

## 2014-10-12 MED ORDER — CEFAZOLIN SODIUM 1-5 GM-% IV SOLN
INTRAVENOUS | Status: AC
  Filled 2014-10-12: qty 100

## 2014-10-12 MED ORDER — ONDANSETRON HCL 4 MG/2ML IJ SOLN
INTRAMUSCULAR | Status: DC | PRN
  Administered 2014-10-12: 4 mg via INTRAVENOUS

## 2014-10-12 MED ORDER — CEFAZOLIN SODIUM-DEXTROSE 2-3 GM-% IV SOLR
INTRAVENOUS | Status: DC | PRN
  Administered 2014-10-12: 2 g via INTRAVENOUS

## 2014-10-12 MED ORDER — SODIUM CHLORIDE 0.9 % IJ SOLN
INTRAMUSCULAR | Status: DC | PRN
  Administered 2014-10-12: 5 mL

## 2014-10-12 MED ORDER — MIDAZOLAM HCL 2 MG/2ML IJ SOLN
INTRAMUSCULAR | Status: AC
  Filled 2014-10-12: qty 2

## 2014-10-12 MED ORDER — LACTATED RINGERS IV SOLN
INTRAVENOUS | Status: DC
  Administered 2014-10-12: 12:00:00 via INTRAVENOUS
  Administered 2014-10-12: 10 mL/h via INTRAVENOUS
  Administered 2014-10-12: 10:00:00 via INTRAVENOUS

## 2014-10-12 MED ORDER — METHYLENE BLUE 1 % INJ SOLN
INTRAMUSCULAR | Status: AC
  Filled 2014-10-12: qty 10

## 2014-10-12 MED ORDER — MIDAZOLAM HCL 2 MG/2ML IJ SOLN
1.0000 mg | INTRAMUSCULAR | Status: DC | PRN
  Administered 2014-10-12: 2 mg via INTRAVENOUS

## 2014-10-12 MED ORDER — CHLORHEXIDINE GLUCONATE 4 % EX LIQD: 1.0000 "application " | Freq: Once | CUTANEOUS | Status: DC

## 2014-10-12 MED ORDER — CEFAZOLIN SODIUM-DEXTROSE 2-3 GM-% IV SOLR: 2.0000 g | INTRAVENOUS | Status: DC

## 2014-10-12 MED ORDER — PROPOFOL 10 MG/ML IV BOLUS
INTRAVENOUS | Status: DC | PRN
  Administered 2014-10-12: 150 mg via INTRAVENOUS

## 2014-10-12 MED ORDER — HYDROCODONE-ACETAMINOPHEN 5-325 MG PO TABS: 1.0000 | ORAL_TABLET | Freq: Four times a day (QID) | ORAL | Status: DC | PRN

## 2014-10-12 MED ORDER — MIDAZOLAM HCL 5 MG/5ML IJ SOLN
INTRAMUSCULAR | Status: DC | PRN
  Administered 2014-10-12: 2 mg via INTRAVENOUS

## 2014-10-12 MED ORDER — BUPIVACAINE-EPINEPHRINE (PF) 0.5% -1:200000 IJ SOLN
INTRAMUSCULAR | Status: AC
  Filled 2014-10-12: qty 30

## 2014-10-12 MED ORDER — HYDROMORPHONE HCL 1 MG/ML IJ SOLN
INTRAMUSCULAR | Status: AC
  Filled 2014-10-12: qty 1

## 2014-10-12 MED ORDER — LIDOCAINE HCL (CARDIAC) 20 MG/ML IV SOLN
INTRAVENOUS | Status: DC | PRN
  Administered 2014-10-12: 50 mg via INTRAVENOUS

## 2014-10-12 MED ORDER — SODIUM CHLORIDE 0.9 % IJ SOLN
INTRAMUSCULAR | Status: AC
  Filled 2014-10-12: qty 10

## 2014-10-12 MED ORDER — BUPIVACAINE-EPINEPHRINE (PF) 0.5% -1:200000 IJ SOLN
INTRAMUSCULAR | Status: DC | PRN
Start: 2014-10-12 — End: 2014-10-12
  Administered 2014-10-12: 30 mL

## 2014-10-12 MED ORDER — BUPIVACAINE-EPINEPHRINE (PF) 0.5% -1:200000 IJ SOLN
INTRAMUSCULAR | Status: DC | PRN
  Administered 2014-10-12: 19 mL

## 2014-10-12 SURGICAL SUPPLY — 66 items
APL SKNCLS STERI-STRIP NONHPOA (GAUZE/BANDAGES/DRESSINGS)
APPLIER CLIP 9.375 MED OPEN (MISCELLANEOUS) ×3
APR CLP MED 9.3 20 MLT OPN (MISCELLANEOUS) ×1
BENZOIN TINCTURE PRP APPL 2/3 (GAUZE/BANDAGES/DRESSINGS) IMPLANT
BINDER BREAST LRG (GAUZE/BANDAGES/DRESSINGS) IMPLANT
BINDER BREAST MEDIUM (GAUZE/BANDAGES/DRESSINGS) IMPLANT
BINDER BREAST XLRG (GAUZE/BANDAGES/DRESSINGS) ×2 IMPLANT
BINDER BREAST XXLRG (GAUZE/BANDAGES/DRESSINGS) IMPLANT
BLADE HEX COATED 2.75 (ELECTRODE) ×3 IMPLANT
BLADE SURG 10 STRL SS (BLADE) IMPLANT
BLADE SURG 15 STRL LF DISP TIS (BLADE) ×1 IMPLANT
BLADE SURG 15 STRL SS (BLADE) ×3
CANISTER SUC SOCK COL 7IN (MISCELLANEOUS) IMPLANT
CANISTER SUCT 1200ML W/VALVE (MISCELLANEOUS) ×3 IMPLANT
CHLORAPREP W/TINT 26ML (MISCELLANEOUS) ×3 IMPLANT
CLIP APPLIE 9.375 MED OPEN (MISCELLANEOUS) ×1 IMPLANT
CLOSURE WOUND 1/2 X4 (GAUZE/BANDAGES/DRESSINGS)
COVER BACK TABLE 60X90IN (DRAPES) ×3 IMPLANT
COVER MAYO STAND STRL (DRAPES) ×3 IMPLANT
COVER PROBE W GEL 5X96 (DRAPES) ×3 IMPLANT
DECANTER SPIKE VIAL GLASS SM (MISCELLANEOUS) IMPLANT
DEVICE DUBIN W/COMP PLATE 8390 (MISCELLANEOUS) ×3 IMPLANT
DRAPE LAPAROSCOPIC ABDOMINAL (DRAPES) ×3 IMPLANT
DRAPE UTILITY XL STRL (DRAPES) ×3 IMPLANT
DRSG PAD ABDOMINAL 8X10 ST (GAUZE/BANDAGES/DRESSINGS) IMPLANT
ELECT REM PT RETURN 9FT ADLT (ELECTROSURGICAL) ×3
ELECTRODE REM PT RTRN 9FT ADLT (ELECTROSURGICAL) ×1 IMPLANT
GAUZE SPONGE 4X4 12PLY STRL (GAUZE/BANDAGES/DRESSINGS) IMPLANT
GLOVE BIOGEL M 7.0 STRL (GLOVE) ×2 IMPLANT
GLOVE BIOGEL PI IND STRL 7.5 (GLOVE) IMPLANT
GLOVE BIOGEL PI INDICATOR 7.5 (GLOVE) ×2
GLOVE ECLIPSE 6.5 STRL STRAW (GLOVE) ×2 IMPLANT
GLOVE ECLIPSE 7.0 STRL STRAW (GLOVE) ×2 IMPLANT
GLOVE EUDERMIC 7 POWDERFREE (GLOVE) ×6 IMPLANT
GLOVE EXAM NITRILE MD LF STRL (GLOVE) ×2 IMPLANT
GOWN STRL REUS W/ TWL LRG LVL3 (GOWN DISPOSABLE) ×2 IMPLANT
GOWN STRL REUS W/ TWL XL LVL3 (GOWN DISPOSABLE) ×1 IMPLANT
GOWN STRL REUS W/TWL LRG LVL3 (GOWN DISPOSABLE) ×9
GOWN STRL REUS W/TWL XL LVL3 (GOWN DISPOSABLE) ×3
KIT MARKER MARGIN INK (KITS) ×3 IMPLANT
LIQUID BAND (GAUZE/BANDAGES/DRESSINGS) ×3 IMPLANT
NDL HYPO 25X1 1.5 SAFETY (NEEDLE) ×2 IMPLANT
NDL SAFETY ECLIPSE 18X1.5 (NEEDLE) ×1 IMPLANT
NEEDLE HYPO 18GX1.5 SHARP (NEEDLE) ×3
NEEDLE HYPO 25X1 1.5 SAFETY (NEEDLE) ×6 IMPLANT
NS IRRIG 1000ML POUR BTL (IV SOLUTION) ×3 IMPLANT
PACK BASIN DAY SURGERY FS (CUSTOM PROCEDURE TRAY) ×3 IMPLANT
PENCIL BUTTON HOLSTER BLD 10FT (ELECTRODE) ×3 IMPLANT
SHEET MEDIUM DRAPE 40X70 STRL (DRAPES) IMPLANT
SLEEVE SCD COMPRESS KNEE MED (MISCELLANEOUS) ×3 IMPLANT
SPONGE LAP 18X18 X RAY DECT (DISPOSABLE) IMPLANT
SPONGE LAP 4X18 X RAY DECT (DISPOSABLE) ×5 IMPLANT
STRIP CLOSURE SKIN 1/2X4 (GAUZE/BANDAGES/DRESSINGS) IMPLANT
SUT MNCRL AB 4-0 PS2 18 (SUTURE) ×3 IMPLANT
SUT SILK 2 0 SH (SUTURE) ×3 IMPLANT
SUT VIC AB 2-0 CT1 27 (SUTURE)
SUT VIC AB 2-0 CT1 TAPERPNT 27 (SUTURE) IMPLANT
SUT VIC AB 3-0 SH 27 (SUTURE)
SUT VIC AB 3-0 SH 27X BRD (SUTURE) IMPLANT
SUT VICRYL 3-0 CR8 SH (SUTURE) ×3 IMPLANT
SYRINGE 10CC LL (SYRINGE) ×6 IMPLANT
TOWEL OR 17X24 6PK STRL BLUE (TOWEL DISPOSABLE) ×3 IMPLANT
TOWEL OR NON WOVEN STRL DISP B (DISPOSABLE) ×3 IMPLANT
TUBE CONNECTING 20'X1/4 (TUBING) ×1
TUBE CONNECTING 20X1/4 (TUBING) ×2 IMPLANT
YANKAUER SUCT BULB TIP NO VENT (SUCTIONS) ×3 IMPLANT

## 2014-10-12 NOTE — Interval H&P Note (Signed)
History and Physical Interval Note:  10/12/2014 10:41 AM  Destiny Moody  has presented today for surgery, with the diagnosis of invasive cancer right breast  The various methods of treatment have been discussed with the patient and family. After consideration of risks, benefits and other options for treatment, the patient has consented to  Procedure(s): RIGHT  PARTIAL MASTECTOMY WITH RADIOACTIVE SEED LOCALIZATION  RIGHT  AXILLARY SENTINEL  NODE BIOPSY (Right) as a surgical intervention .  The patient's history has been reviewed, patient examined, no change in status, stable for surgery.  I have reviewed the patient's chart and labs.  Questions were answered to the patient's satisfaction.     Adin Hector

## 2014-10-12 NOTE — Discharge Instructions (Signed)
[place] Office Phone Number [phone]  BREAST BIOPSY/ PARTIAL MASTECTOMY: POST OP INSTRUCTIONS  Always review your discharge instruction sheet given to you by the facility where your surgery was performed.  IF YOU HAVE DISABILITY OR FAMILY LEAVE FORMS, YOU MUST BRING THEM TO THE OFFICE FOR PROCESSING.  DO NOT GIVE THEM TO YOUR DOCTOR.  1. A prescription for pain medication may be given to you upon discharge.  Take your pain medication as prescribed, if needed.  If narcotic pain medicine is not needed, then you may take acetaminophen (Tylenol) or ibuprofen (Advil) as needed. 2. Take your usually prescribed medications unless otherwise directed 3. If you need a refill on your pain medication, please contact your pharmacy.  They will contact our office to request authorization.  Prescriptions will not be filled after 5pm or on week-ends. 4. You should eat very light the first 24 hours after surgery, such as soup, crackers, pudding, etc.  Resume your normal diet the day after surgery. 5. Most patients will experience some swelling and bruising in the breast.  Ice packs and a good support bra will help.  Swelling and bruising can take several days to resolve.  6. It is common to experience some constipation if taking pain medication after surgery.  Increasing fluid intake and taking a stool softener will usually help or prevent this problem from occurring.  A mild laxative (Milk of Magnesia or Miralax) should be taken according to package directions if there are no bowel movements after 48 hours. 7. Unless discharge instructions indicate otherwise, you may remove your bandages 24-48 hours after surgery, and you may shower at that time.  You may have steri-strips (small skin tapes) in place directly over the incision.  These strips should be left on the skin for 7-10 days.  If your surgeon used skin glue on the incision, you may shower in 24 hours.  The glue will flake off over the  next 2-3 weeks.  Any sutures or staples will be removed at the office during your follow-up visit. 8. ACTIVITIES:  You may resume regular daily activities (gradually increasing) beginning the next day.  Wearing a good support bra or sports bra minimizes pain and swelling.  You may have sexual intercourse when it is comfortable. a. You may drive when you no longer are taking prescription pain medication, you can comfortably wear a seatbelt, and you can safely maneuver your car and apply brakes. b. RETURN TO WORK:  ______________________________________________________________________________________ 9. You should see your doctor in the office for a follow-up appointment approximately two weeks after your surgery.  Your doctors nurse will typically make your follow-up appointment when she calls you with your pathology report.  Expect your pathology report 2-3 business days after your surgery.  You may call to check if you do not hear from [place] after three days. 10. OTHER INSTRUCTIONS: _______________________________________________________________________________________________ _____________________________________________________________________________________________________________________________________ _____________________________________________________________________________________________________________________________________ _____________________________________________________________________________________________________________________________________  WHEN TO CALL YOUR DOCTOR: 1. Fever over 101.0 2. Nausea and/or vomiting. 3. Extreme swelling or bruising. 4. Continued bleeding from incision. 5. Increased pain, redness, or drainage from the incision.  The clinic staff is available to answer your questions during regular business hours.  Please dont hesitate to call and ask to speak to one of the nurses for clinical concerns.  If you have a medical emergency, go to the nearest  emergency room or call 911.  A surgeon from [facility] is always on call at the hospital.  For further questions, please visit [email or web address] °Office Phone Number [phone] ° °BREAST BIOPSY/ PARTIAL MASTECTOMY: POST OP INSTRUCTIONS ° °Always review your discharge instruction sheet given to you by the facility where your surgery was performed. ° °IF YOU HAVE DISABILITY OR FAMILY LEAVE FORMS, YOU MUST BRING THEM TO THE OFFICE FOR PROCESSING.  DO NOT GIVE THEM TO YOUR DOCTOR. ° °11. A prescription for pain medication may be given to you upon discharge.  Take your pain medication as prescribed, if needed.  If narcotic pain medicine is not needed, then you may take acetaminophen (Tylenol) or ibuprofen (Advil) as needed. °12. Take your usually prescribed medications unless otherwise directed °13. If you need a refill on your pain medication, please contact your pharmacy.  They will contact our office to request authorization.  Prescriptions will not be filled after 5pm or on week-ends. °14. You should eat very light the first 24 hours after surgery, such as soup, crackers, pudding, etc.  Resume your normal diet the day after surgery. °15. Most patients will experience some swelling and bruising in the breast.  Ice packs and a good support bra will help.  Swelling and bruising can take several days to resolve.  °16. It is common to experience some constipation if taking pain medication after surgery.  Increasing fluid intake and taking a stool softener will usually help or prevent this problem from occurring.  A mild laxative (Milk of Magnesia or Miralax) should be taken according to package directions if there are no bowel movements after 48 hours. °17. Unless discharge instructions indicate otherwise, you may remove your bandages 24-48 hours after surgery, and you may shower at that time.  You may have steri-strips (small skin tapes) in place directly over the  incision.  These strips should be left on the skin for 7-10 days.  If your surgeon used skin glue on the incision, you may shower in 24 hours.  The glue will flake off over the next 2-3 weeks.  Any sutures or staples will be removed at the office during your follow-up visit. °18. ACTIVITIES:  You may resume regular daily activities (gradually increasing) beginning the next day.  Wearing a good support bra or sports bra minimizes pain and swelling.  You may have sexual intercourse when it is comfortable. °a. You may drive when you no longer are taking prescription pain medication, you can comfortably wear a seatbelt, and you can safely maneuver your car and apply brakes. °b. RETURN TO WORK:  ______________________________________________________________________________________ °19. You should see your doctor in the office for a follow-up appointment approximately two weeks after your surgery.  Your doctor’s nurse will typically make your follow-up appointment when she calls you with your pathology report.  Expect your pathology report 2-3 business days after your surgery.  You may call to check if you do not hear from us after three days. °20. OTHER INSTRUCTIONS: _______________________________________________________________________________________________ _____________________________________________________________________________________________________________________________________ °_____________________________________________________________________________________________________________________________________ °_____________________________________________________________________________________________________________________________________ ° °WHEN TO CALL YOUR DOCTOR: °6. Fever over 101.0 °7. Nausea and/or vomiting. °8. Extreme swelling or bruising. °9. Continued bleeding from incision. °10. Increased pain, redness, or drainage from the incision. ° °The clinic staff is available to answer your questions  during regular business hours.  Please don’t hesitate to call and ask to speak to one of the nurses for clinical concerns.  If you have a medical emergency, go to the nearest emergency room or call 911.  A surgeon from [facility] is always on call at the hospital. ° °For further questions, please visit [email or web address]  ° ° °  Post Anesthesia Home Care Instructions ° °Activity: °Get plenty of rest for the remainder of the day. A responsible adult should stay with you for 24 hours following the procedure.  °For the next 24 hours, DO NOT: °-Drive a car °-Operate machinery °-Drink alcoholic beverages °-Take any medication unless instructed by your physician °-Make any legal decisions or sign important papers. ° °Meals: °Start with liquid foods such as gelatin or soup. Progress to regular foods as tolerated. Avoid greasy, spicy, heavy foods. If nausea and/or vomiting occur, drink only clear liquids until the nausea and/or vomiting subsides. Call your physician if vomiting continues. ° °Special Instructions/Symptoms: °Your throat may feel dry or sore from the anesthesia or the breathing tube placed in your throat during surgery. If this causes discomfort, gargle with warm salt water. The discomfort should disappear within 24 hours. ° °

## 2014-10-12 NOTE — Op Note (Signed)
Patient Name:           Destiny Moody   Date of Surgery:        10/12/2014  Pre op Diagnosis:      Invasive cancer right breast, upper inner quadrant, receptor positive, HER-2 negative. Clinical stage TIc, N0  Post op Diagnosis:    Same  Procedure:                 Inject blue dye right breast Right partial mastectomy with radioactive seed localization Right axillary sentinel node biopsy  Surgeon:                     Edsel Petrin. Dalbert Batman, M.D., FACS  Assistant:                      OR staff  Operative Indications:    This is a 88 his-year-old Hispanic female, referred by Dr. 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 She understands English fairly well. She has been evaluated by Dr. Pablo Ledger, Dr. Burr Medico, and me. Past history reveals a right breast biopsy for benign disease Screening mammograms showed a small density in the upper inner right breast. Image guided biopsy revealed a benign process and was felt to be discordant. A large hematoma formed and they waited 2 months and then brought her back in November for further imaging. Mammograms and MRI showed a 1.7 cm clumped area of non-mass enhancement in the right breast, slightly superior slightly lateral to the old biopsy site. The metallic clip was not present and was felt to have been extruded. 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 Comorbidities include hyperlipidemia, non-insulin-dependent diabetes, history of C-section and laparotomy Family history positive for ovarian cancer in a first cousin. No breast cancer.  We discussed management options at length, including lumpectomy, mastectomy, sentinel node biopsy, reconstruction. She is strongly motivated for breast conservation surgery. She will be scheduled for right partial mastectomy with  radioactive seed localization and right axillary sentinel node biopsy.  Operative Findings:       The radioactive seed was identified in the right breast with the neoprobe in the holding area. The specimen mammogram showed the marker clip and the radioactive seed in the relative center of the specimen. The dissection was taken down all the way to the pectoralis fascia since the breast was fairly small. I found a single sentinel lymph node.  Procedure in Detail:          The patient underwent placement of a radioactive seed in the right breast by the radiologist yesterday. The patient was brought to the holding area at [facility] day surgery center where I used the neoprobe and identified the radioactivity in the upper inner quadrant of the right breast. The nuclear medicine technician injected technetium 99 radionuclide into the right breast. The patient was taken to the operating room and underwent general anesthesia. Intravenous antibiotics were given. Surgical timeout was performed. Following alcohol prep I injected 5 mL of blue dye into the right breast, subareolar area. This was 2 mL of methylene blue mixed with 3 mL of saline. The breast was massaged for a few minutes.     Using the neoprobe I identified the area of maximum radioactivity in the right breast upper inner quadrant, approximately 1:00  position. This area was marked with a marking pen. 0.5% Marcaine with epinephrine was used as a local infiltration anesthetic. A curvilinear, circumareolar incision was made overlying the radioactivity. Dissection was carried down into the breast tissue and widely around the radioactivity. The specimen was removed and marked with the ink kit  and silk sutures to orient the pathologist. Specimen mammogram looked good. Specimen was marked and sent to the lab. I undermined the breast tissue at the level of the pectoralis fascia superiorly and inferiorly to help slide the tissues back together. I  irrigated the wound. The  best tissue was closed in 2 layers with interrupted 3-0 Vicryl's and the skin closed with a running subcuticular 4-0 Monocryl and Dermabond.       The right axillary incision was planned and 0.5% Marcaine with epinephrine was used as a local infiltration anesthetic. Transverse incision was made at the hairline. Dissection was carried down through subcutaneous tissue. The clavipectoral fascia was incised. Using the neoprobe I identified a somewhat enlarged very blue very hot sentinel lymph node. Once this was removed I found essentially no residual radioactivity and did not see any further blue lymphatics. That wound was irrigated with saline. Hemostasis excellent. Tissues were closed with interrupted 3-0 Vicryl and skin closed with a running subcuticular 4-0 Monocryl and Dermabond. Breast binder was placed. Patient tolerated procedure well was taken to recovery room in stable condition. EBL 15 mL. Counts correct. Complications none.          Edsel Petrin. Dalbert Batman, M.D., FACS General and Minimally Invasive Surgery Breast and Colorectal Surgery  10/12/2014 12:59 PM

## 2014-10-12 NOTE — Progress Notes (Signed)
Nuc med injection performed by nuclear med staff, pt is sedated for procedure

## 2014-10-12 NOTE — Anesthesia Postprocedure Evaluation (Signed)
  Anesthesia Post-op Note  Patient: Destiny Moody  Procedure(s) Performed: Procedure(s): RIGHT  PARTIAL MASTECTOMY WITH RADIOACTIVE SEED LOCALIZATION  RIGHT  AXILLARY SENTINEL  NODE BIOPSY (Right)  Patient Location: PACU  Anesthesia Type:GA combined with regional for post-op pain  Level of Consciousness: awake and alert   Airway and Oxygen Therapy: Patient Spontanous Breathing  Post-op Pain: none  Post-op Assessment: Post-op Vital signs reviewed  Post-op Vital Signs: Reviewed  Last Vitals:  Filed Vitals:   10/12/14 1542  BP: 137/72  Pulse: 88  Temp: 36.7 C  Resp: 18    Complications: No apparent anesthesia complications

## 2014-10-12 NOTE — Progress Notes (Signed)
Assisted Dr. Edmond Fitzgerald with right, ultrasound guided, pectoralis block. Side rails up, monitors on throughout procedure. See vital signs in flow sheet. Tolerated Procedure well. 

## 2014-10-12 NOTE — Transfer of Care (Signed)
Immediate Anesthesia Transfer of Care Note  Patient: Destiny Moody  Procedure(s) Performed: Procedure(s): RIGHT  PARTIAL MASTECTOMY WITH RADIOACTIVE SEED LOCALIZATION  RIGHT  AXILLARY SENTINEL  NODE BIOPSY (Right)  Patient Location: PACU  Anesthesia Type:GA combined with regional for post-op pain  Level of Consciousness: awake and sedated  Airway & Oxygen Therapy: Patient Spontanous Breathing and Patient connected to face mask oxygen  Post-op Assessment: Report given to PACU RN and Post -op Vital signs reviewed and stable  Post vital signs: Reviewed and stable  Complications: No apparent anesthesia complications

## 2014-10-12 NOTE — Anesthesia Preprocedure Evaluation (Addendum)
Anesthesia Evaluation  Patient identified by MRN, date of birth, ID band Patient awake    Reviewed: Allergy & Precautions, H&P , NPO status , Patient's Chart, lab work & pertinent test results  Airway Mallampati: II  TM Distance: >3 FB Neck ROM: Full    Dental no notable dental hx. (+) Teeth Intact, Dental Advisory Given   Pulmonary neg pulmonary ROS,  breath sounds clear to auscultation  Pulmonary exam normal       Cardiovascular negative cardio ROS  Rhythm:Regular Rate:Normal     Neuro/Psych negative neurological ROS  negative psych ROS   GI/Hepatic Neg liver ROS, GERD-  Medicated and Controlled,  Endo/Other  negative endocrine ROSdiabetes, Type 2, Oral Hypoglycemic AgentsHypothyroidism   Renal/GU negative Renal ROS  negative genitourinary   Musculoskeletal   Abdominal   Peds  Hematology negative hematology ROS (+)   Anesthesia Other Findings   Reproductive/Obstetrics negative OB ROS                            Anesthesia Physical Anesthesia Plan  ASA: II  Anesthesia Plan: General and Regional   Post-op Pain Management:    Induction: Intravenous  Airway Management Planned: LMA  Additional Equipment:   Intra-op Plan:   Post-operative Plan: Extubation in OR  Informed Consent: I have reviewed the patients History and Physical, chart, labs and discussed the procedure including the risks, benefits and alternatives for the proposed anesthesia with the patient or authorized representative who has indicated his/her understanding and acceptance.   Dental advisory given  Plan Discussed with: CRNA  Anesthesia Plan Comments:         Anesthesia Quick Evaluation

## 2014-10-12 NOTE — Anesthesia Procedure Notes (Addendum)
Anesthesia Regional Block:  Pectoralis block  Pre-Anesthetic Checklist: ,, timeout performed, Correct Patient, Correct Site, Correct Laterality, Correct Procedure, Correct Position, site marked, Risks and benefits discussed, pre-op evaluation, post-op pain management  Laterality: Right  Prep: Maximum Sterile Barrier Precautions used and chloraprep       Needles:  Injection technique: Single-shot  Needle Type: Echogenic Stimulator Needle     Needle Length: 9cm 9 cm Needle Gauge: 21 and 21 G    Additional Needles:  Procedures: ultrasound guided (picture in chart) Pectoralis block Narrative:  Start time: 10/12/2014 10:51 AM End time: 10/12/2014 11:00 AM Injection made incrementally with aspirations every 5 mL.  Performed by: Personally  Anesthesiologist: Roderic Palau E  Additional Notes: 2% Lidocaine skin wheel.   Procedure Name: LMA Insertion Date/Time: 10/12/2014 11:53 AM Performed by: Toula Moos L Pre-anesthesia Checklist: Patient identified, Emergency Drugs available, Suction available, Patient being monitored and Timeout performed Patient Re-evaluated:Patient Re-evaluated prior to inductionOxygen Delivery Method: Circle System Utilized Preoxygenation: Pre-oxygenation with 100% oxygen Intubation Type: IV induction Ventilation: Mask ventilation without difficulty LMA: LMA inserted LMA Size: 4.0 Number of attempts: 1 Airway Equipment and Method: Bite block Placement Confirmation: positive ETCO2 Tube secured with: Tape Dental Injury: Teeth and Oropharynx as per pre-operative assessment

## 2014-10-13 ENCOUNTER — Encounter (HOSPITAL_BASED_OUTPATIENT_CLINIC_OR_DEPARTMENT_OTHER): Payer: Self-pay | Admitting: General Surgery

## 2014-10-15 ENCOUNTER — Telehealth: Payer: Self-pay | Admitting: *Deleted

## 2014-10-15 NOTE — Telephone Encounter (Signed)
Ordered Oncotype per Dr. Burr Medico. Requisition sent to pathology. Received by Alyse Low.

## 2014-10-15 NOTE — Progress Notes (Signed)
Quick Note:  Inform patient of Pathology report,. Tell her that cancer completely removed with negative margin. Lymph nodes negative for cancer. Good news. No further surgery planned.   hmi ______

## 2014-10-22 ENCOUNTER — Telehealth: Payer: Self-pay | Admitting: *Deleted

## 2014-10-26 ENCOUNTER — Telehealth: Payer: Self-pay | Admitting: *Deleted

## 2014-10-26 ENCOUNTER — Encounter (HOSPITAL_COMMUNITY): Payer: Self-pay

## 2014-10-26 NOTE — Telephone Encounter (Signed)
Received oncotype Dx testing results of 14/9%. Copy given to Dr. Burr Medico. Original to HIM for scanning

## 2014-10-27 ENCOUNTER — Telehealth: Payer: Self-pay | Admitting: Hematology

## 2014-10-27 NOTE — Telephone Encounter (Signed)
Patient confirmed appointment time/date for 11/01/14.

## 2014-11-01 ENCOUNTER — Telehealth: Payer: Self-pay | Admitting: Hematology

## 2014-11-01 ENCOUNTER — Ambulatory Visit: Payer: Self-pay | Admitting: Hematology

## 2014-11-01 NOTE — Telephone Encounter (Signed)
Patient confirmed appointment r/s for 03/07

## 2014-11-03 ENCOUNTER — Encounter: Payer: Self-pay | Admitting: Radiation Oncology

## 2014-11-03 NOTE — Progress Notes (Signed)
Location of Breast Cancer:  Right upper inner  Histology per Pathology Report:  10/12/14 Diagnosis 1. Breast, lumpectomy, right - INVASIVE DUCTAL CARCINOMA, SEE COMMENT. - NEGATIVE FOR LYMPH VASCULAR INVASION. - INVASIVE TUMOR IS 0.7 CM FROM NEAREST MARGIN (ANTERIOR). - DUCTAL CARCINOMA IN SITU - PREVIOUS BIOPSY SITE. - SEE TUMOR SYNOPTIC TEMPLATE BELOW. 2. Lymph node, sentinel, biopsy, right axillary - ONE LYMPH NODE, NEGATIVE FOR TUMOR (0/1).  Receptor Status: ER(100%), PR (99%), Her2-neu (-)  Did patient present with symptoms (if so, please note symptoms) or was this found on screening mammography?: screening mammogram  Past/Anticipated interventions by surgeon, if any: Dr Dalbert Batman: biopsy Sept 2015, surgery Dec 2015, saw Dr Dalbert Batman on 10/19/04 for swelling/pain in right axilla, FU in one week for right axilla swelling  Past/Anticipated interventions by medical oncology, if any: Chemotherapy Dr Burr Medico; patient has not seen med onc since 09/01/14, pt cancelled 11/01/14 FU with Dr Burr Medico, next appt 11/22/14 09/01/14 Dr Ernestina Penna note:   Plan -She is likely going to have right lumpectomy. If her surgical path and staging remains unchanged, I will send her surgical sample out for Oncotype test. -Return to my clinic in 3 weeks after her surgery to discuss Oncotype results and decision about Adjuvant chemotherapy -Start adjuvant Aromasin for 5 years after she completes adjuvant irradiation.  Lymphedema issues, if any:    no  Pain issues, if any:   Pain in right axilla and right upper breast, scar area. She describes this pain as "like when you are nursing your baby". She rates this pain from mild to moderate or worse when she moves her right arm. She has taken Hydrocodone with relief, but it makes her sleepy. She owns/runs a Production designer, theatre/television/film. She has taken Ibuprofen with relief at times. Advised she try Aleve which she has taken in past.   SAFETY ISSUES:  Prior radiation? no  Pacemaker/ICD?  no  Possible current pregnancy? no  Is the patient on methotrexate? no  Current Complaints / other details:  Patient seen in [facility] on 09/01/14. 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

## 2014-11-04 ENCOUNTER — Ambulatory Visit
Admission: RE | Admit: 2014-11-04 | Discharge: 2014-11-04 | Disposition: A | Discharge status: Home / Self Care | Payer: 59 | Source: Ambulatory Visit | Attending: Radiation Oncology | Admitting: Radiation Oncology

## 2014-11-04 ENCOUNTER — Encounter: Payer: Self-pay | Admitting: Radiation Oncology

## 2014-11-04 DIAGNOSIS — Z510 Encounter for antineoplastic radiation therapy: Secondary | ICD-10-CM | POA: Insufficient documentation

## 2014-11-04 DIAGNOSIS — C50211 Malignant neoplasm of upper-inner quadrant of right female breast: Secondary | ICD-10-CM

## 2014-11-04 DIAGNOSIS — Z9011 Acquired absence of right breast and nipple: Secondary | ICD-10-CM | POA: Diagnosis not present

## 2014-11-04 DIAGNOSIS — Z170 Estrogen receptor positive status [ER+]: Secondary | ICD-10-CM | POA: Insufficient documentation

## 2014-11-04 HISTORY — DX: Malignant neoplasm of unspecified site of unspecified female breast: C50.919

## 2014-11-04 NOTE — Progress Notes (Signed)
Patient circled 10 on distress screening. This RN asked her three times if she had thoughts of harming herself. She answered twice by saying "No, not yet." Left voice mail for Abby Potash, [place] and then spoke with Ander Purpura, SW who stated she will see patient today following her appointment with Dr Pablo Ledger.

## 2014-11-04 NOTE — Progress Notes (Signed)
   Department of Radiation Oncology  Phone:  [phone] Fax:        [phone]   Name: Destiny Moody MRN: [id number]  DOB: March 13, 1951  Date: 11/04/2014  Follow Up Visit Note  Diagnosis: Breast cancer of upper-inner quadrant of right female breast   Staging form: Breast, AJCC 7th Edition     Clinical stage from 09/01/2014: Stage IA (T1c, N0, M0) - Unsigned     Pathologic stage from 10/14/2014: Stage IA (T1c, N0, cM0) - Signed by Seward Grater, MD on 10/19/2014       Staging comments: Staged on final lumpectomy specimen by Dr. Donato Heinz  Interval History: Destiny Moody presents today for routine followup.  She had her lumpectomy on 1/26 which showed a 1.2 cm tumor with negative margins. Her tumor was ER/PR+ HER2-. One sentinel lymph node was negative. She was to meet with Dr. Burr Medico to discuss the results of her Oncotype score but cancelled due to weather. She has had some soreness in her axilla along with swelling that she will be seeing Dr. Dalbert Batman next week.   Physical Exam:  Filed Vitals:   11/04/14 0911  BP: 129/61  Pulse: 91  Temp: 97.7 F (36.5 C)  TempSrc: Oral  Resp: 20  Weight: 155 lb (70.308 kg)   Pleasant female. Seroma palpable under the axillary incision. Breast incision is healing well. Alert and oriented  IMPRESSION: Destiny Moody is a 64 y.o. female s/p lumpectomy  PLAN:  I spoke to the patient today regarding her diagnosis and options for treatment. We discussed the equivalence in terms of survival and local failure between mastectomy and breast conservation. We discussed the role of radiation in decreasing local failures in patients who undergo lumpectomy. We discussed the process of simulation and the placement tattoos. We discussed 4-6 weeks of treatment as an outpatient. We discussed the possibility of asymptomatic lung damage. We discussed the low likelihood of secondary malignancies. We discussed the possible side effects including but not limited to skin redness, fatigue, permanent  skin darkening, and breast swelling. We discussed the process of simulation and the placement of tattoos. I will see her back after her Oncotype score discussion with Dr. Burr Medico. I did clarify with her that if she needed chemotherapy this would be performed prior to radiation.   She would benefit from a few more weeks of healing. I have asked her to call and schedule her simulation after she meets with Dr. Burr Medico in the first part of March. I have given her the number and direction on how to call simulation. I've asked her to call me with any questions.    Thea Silversmith, MD

## 2014-11-05 ENCOUNTER — Encounter: Payer: Self-pay | Admitting: *Deleted

## 2014-11-05 NOTE — Progress Notes (Signed)
[place] Psychosocial Distress Screening Clinical Social Work  Clinical Social Work was referred by distress screening protocol.  The patient scored a 10 on the Psychosocial Distress Thermometer which indicates severe distress. Clinical Social Worker phoned pt to assess for distress and other psychosocial needs. Pt stated she could not talk currently as she was at work. CSW briefly explained role of CSW and Pt and Family Support Team and how we could assist. Pt agreed to return CSW call.   ONCBCN DISTRESS SCREENING 11/04/2014  Screening Type Initial Screening  Distress experienced in past week (1-10) 10  Practical problem type Work/school  Family Problem type Children  Emotional problem type Nervousness/Anxiety  Spiritual/Religous concerns type   Information Concerns Type Lack of info about diagnosis;Lack of info about treatment;Lack of info about complementary therapy choices  Physical Problem type Pain  Referral to support programs   Other "Pain on my armpit, pain" listed as most distressing, may phone. Pt denies wanting to harm self, stated "No, not yet."    Clinical Social Worker follow up needed: Yes.    If yes, follow up plan: CSW awaits return call. Loren Racer, [place] Worker [place]  [place] Phone: [phone] Fax: [phone]

## 2014-11-08 NOTE — Addendum Note (Signed)
Encounter addended by: Andria Rhein, RN on: 11/08/2014  1:36 PM<BR>     Documentation filed: Notes Section

## 2014-11-08 NOTE — Progress Notes (Signed)
error 

## 2014-11-08 NOTE — Addendum Note (Signed)
Encounter addended by: Arlyss Repress, RN on: 11/08/2014 12:47 PM<BR>     Documentation filed: Charges VN

## 2014-11-18 ENCOUNTER — Telehealth: Payer: Self-pay | Admitting: *Deleted

## 2014-11-18 NOTE — Telephone Encounter (Signed)
Pt called stating c/o lump at her surgery site, pt saw Dr.Ingram( at [facility] per patient) and he is the MD that did her surgery. I advised pt best to contact there office regarding this. Pt agreed and will call [facility].

## 2014-11-19 ENCOUNTER — Ambulatory Visit (INDEPENDENT_AMBULATORY_CARE_PROVIDER_SITE_OTHER): Payer: 59 | Admitting: Family Medicine

## 2014-11-19 VITALS — BP 118/64 | HR 87 | Temp 98.0°F | Resp 16 | Ht 63.0 in | Wt 150.4 lb

## 2014-11-19 DIAGNOSIS — N644 Mastodynia: Secondary | ICD-10-CM | POA: Diagnosis not present

## 2014-11-19 DIAGNOSIS — N61 Inflammatory disorders of breast: Secondary | ICD-10-CM | POA: Diagnosis not present

## 2014-11-19 NOTE — Progress Notes (Signed)
This chart was scribed for [name], MD by Einar Pheasant, ED Scribe. This patient was seen in room 1 and the patient's care was started at 12:00 PM.  Subjective:    Patient ID: Destiny Moody, female    DOB: [birth year], 64 y.o.   MRN: [id number]  Chief Complaint  Patient presents with  . Arm Pain    Right armpit swollen and painful x 3 days    HPI Destiny Moody is a 64 y.o. female with PMhx of osteoporosis and breast cancer.  On 10/12/14 surgical procedure was performed to remove CA from right breast. Some lymph nodes were removed from the right axilla as well. After following up with the surgeon 1 month ago she was complaining of some right axilla tenderness but the provider told her that there are some un-alarming cyst and to follow up if her symptoms worsen.  Pt went back to Dr. Dalbert Batman on Monday, but he was not there so she saw another surgeon. He examined the affected area and told her that that he does not see any alarming symptoms. However, she states that in the past 3 days the pain to her right axilla is worsening and she has noted some associated swelling to it. Pt states that her next scheduled appointment with her surgeon March 17. She does endorse some associated chills. No fever.  Patient Active Problem List   Diagnosis Date Noted  . Breast cancer of upper-inner quadrant of right female breast 08/26/2014  . Osteoporosis 08/10/2014  . Vaginal atrophy 04/27/2014  . RLQ PAIN 04/08/2008  . GERD 04/07/2008  . FATTY LIVER DISEASE 04/07/2008   Past Medical History  Diagnosis Date  . Hypothyroidism   . Rheumatoid arthritis(714.0)   . Osteopenia   . NIDDM (non-insulin dependent diabetes mellitus)   . Multinodular thyroid   . Hypercholesterolemia   . HSV infection   . Osteoporosis 2015  . Cancer   . Wears contact lenses   . Breast cancer 08/24/14    right, upper inner   Past Surgical History  Procedure Laterality Date  . Cholecystectomy  1993  . Tubal ligation   1996    re annastomosis  . Cesarean section  [id number]  . Colonoscopy    . Radioactive seed guided mastectomy with axillary sentinel lymph node biopsy Right 10/12/2014    Procedure: RIGHT  PARTIAL MASTECTOMY WITH RADIOACTIVE SEED LOCALIZATION  RIGHT  AXILLARY SENTINEL  NODE BIOPSY;  Surgeon: Fanny Skates, MD;  Location: [facility];  Service: General;  Laterality: Right;   No Known Allergies Prior to Admission medications   Medication Sig Start Date End Date Taking? Authorizing Provider  alendronate (FOSAMAX) 10 MG tablet Take 10 mg by mouth daily before breakfast. Take with a full glass of water on an empty stomach.   Yes Historical Provider, MD  amitriptyline (ELAVIL) 25 MG tablet Take 25 mg by mouth at bedtime.   Yes Historical Provider, MD  aspirin 81 MG tablet Take 81 mg by mouth daily.   Yes Historical Provider, MD  Calcium Carbonate-Vitamin D 600-200 MG-UNIT TABS Take 1,200 mg by mouth.   Yes Historical Provider, MD  Exenatide ER 2 MG PEN Inject 2 mg into the skin once a week.   Yes Historical Provider, MD  glimepiride (AMARYL) 4 MG tablet Take 4 mg by mouth daily with breakfast.    Yes Historical Provider, MD  Glucosamine 750 MG TABS Take by mouth.   Yes Historical Provider, MD  HYDROcodone-acetaminophen [facility]) 5-325  MG per tablet Take 1-2 tablets by mouth every 6 (six) hours as needed for moderate pain or severe pain. 10/12/14  Yes Fanny Skates, MD  lisinopril (PRINIVIL,ZESTRIL) 2.5 MG tablet Take 2.5 mg by mouth daily.     Yes Historical Provider, MD  metFORMIN (GLUMETZA) 1000 MG (MOD) 24 hr tablet Take 1,000 mg by mouth 2 (two) times daily with a meal.    Yes Historical Provider, MD  pioglitazone (ACTOS) 30 MG tablet Take 30 mg by mouth daily.   Yes Historical Provider, MD  simvastatin (ZOCOR) 20 MG tablet Take 20 mg by mouth at bedtime.     Yes Historical Provider, MD   History   Social History  . Marital Status: Married    Spouse Name: [name]  . Number of  Children: 2  . Years of Education: College   Occupational History  .      Self Employed   Social History Main Topics  . Smoking status: Never Smoker   . Smokeless tobacco: Never Used  . Alcohol Use: Yes     Comment: occasionally  . Drug Use: No  . Sexual Activity: Not on file     Comment: menarche age 63, G51 , P 2, menopause age 23, no HRT   Other Topics Concern  . Not on file   Social History Narrative   Patient lives at home with her spouse.   Caffeine use: 1 cup daily    Review of Systems  Constitutional: Negative for fatigue and unexpected weight change.  Respiratory: Negative for chest tightness and shortness of breath.   Cardiovascular: Negative for chest pain, palpitations and leg swelling.  Gastrointestinal: Negative for abdominal pain and blood in stool.  Musculoskeletal: Positive for arthralgias.  Neurological: Negative for dizziness, syncope, light-headedness and headaches.   Objective:   Physical Exam  Constitutional: She is oriented to person, place, and time. She appears well-developed and well-nourished.  HENT:  Head: Normocephalic and atraumatic.  Eyes: Conjunctivae and EOM are normal. Pupils are equal, round, and reactive to light.  Neck: Carotid bruit is not present.  Cardiovascular: Normal rate, regular rhythm, normal heart sounds and intact distal pulses.   Pulmonary/Chest: Effort normal and breath sounds normal.  Abdominal: Soft. She exhibits no pulsatile midline mass. There is no tenderness.  Neurological: She is alert and oriented to person, place, and time.  Skin: Skin is warm and dry.  She has a 5 cm curvilinear scar that appears to be well healed but with surrounding erythema 1.5 cm towards axillary area and 3-4 cm towards right breast with tenderness to palpation to that surrounding area. She is also indurated approximately 6 cm x 7 cm under that scar. Light tenderness into axilla but not lymphadenopathy.   Another well healing scar to right  upper breast without any surrounding erythema  Psychiatric: She has a normal mood and affect. Her behavior is normal.  Vitals reviewed.   Filed Vitals:   11/19/14 1119  BP: 118/64  Pulse: 87  Temp: 98 F (36.7 C)  TempSrc: Oral  Resp: 16  Height: 5\' 3"  (1.6 m)  Weight: 150 lb 6.4 oz (68.221 kg)  SpO2: 99%    Assessment & Plan:   Destiny Moody is a 64 y.o. female Breast pain, right  Cellulitis of breast Possible cellulitis of prior incision area, with underlying indurated area, surrounding erythema and progression of pain and redness in area over wound in past 3 days.   -d/w Dr. Dalbert Batman - will have her  evaluated at [place] today.    No orders of the defined types were placed in this encounter.   Patient Instructions  I called Dr. Darrel Hoover office. One of the providers will be able to see you this afternoon. Be at [facility] at 2:30pm today.

## 2014-11-19 NOTE — Patient Instructions (Signed)
I called Dr. Darrel Hoover office. One of the providers will be able to see you this afternoon. Be at [facility] at 2:30pm today.

## 2014-11-22 ENCOUNTER — Telehealth: Payer: Self-pay | Admitting: *Deleted

## 2014-11-22 ENCOUNTER — Other Ambulatory Visit (INDEPENDENT_AMBULATORY_CARE_PROVIDER_SITE_OTHER): Payer: Self-pay | Admitting: General Surgery

## 2014-11-22 ENCOUNTER — Ambulatory Visit (HOSPITAL_BASED_OUTPATIENT_CLINIC_OR_DEPARTMENT_OTHER): Payer: 59 | Admitting: Hematology

## 2014-11-22 ENCOUNTER — Telehealth: Payer: Self-pay | Admitting: Hematology

## 2014-11-22 ENCOUNTER — Encounter: Payer: Self-pay | Admitting: Hematology

## 2014-11-22 VITALS — BP 115/66 | HR 91 | Temp 97.6°F | Resp 19 | Ht 63.0 in | Wt 154.1 lb

## 2014-11-22 DIAGNOSIS — Z170 Estrogen receptor positive status [ER+]: Secondary | ICD-10-CM

## 2014-11-22 DIAGNOSIS — E039 Hypothyroidism, unspecified: Secondary | ICD-10-CM

## 2014-11-22 DIAGNOSIS — C50211 Malignant neoplasm of upper-inner quadrant of right female breast: Secondary | ICD-10-CM

## 2014-11-22 DIAGNOSIS — E119 Type 2 diabetes mellitus without complications: Secondary | ICD-10-CM

## 2014-11-22 NOTE — Telephone Encounter (Signed)
Requested bone density report from Cetronia Fernandez's office.

## 2014-11-22 NOTE — Progress Notes (Addendum)
[place]  [phone] [phone] [place] Note   Patient Care Team: Wendie Agreste, MD as PCP - General ([place]) Terrance Mass, MD as Consulting Physician (Gynecology) Fanny Skates, MD as Consulting Physician (General Surgery) Truitt Merle, MD as Consulting Physician (Hematology) Thea Silversmith, MD as Consulting Physician (Radiation Oncology) Trinda Pascal, NP as Nurse Practitioner (Nurse Practitioner) 11/22/2014  CHIEF COMPLAINTS/PURPOSE OF CONSULTATION:  Newly diagnosed Left breast IDA and DCIS   Oncology History   Breast cancer of upper-inner quadrant of right female breast   Staging form: Breast, AJCC 7th Edition     Clinical stage from 09/01/2014: Stage IA (T1c, N0, M0) - Unsigned     Pathologic stage from 10/14/2014: Stage IA (T1c, N0, cM0) - Signed by Seward Grater, MD on 10/19/2014       Staging comments: Staged on final lumpectomy specimen by Dr. Donato Heinz        Breast cancer of upper-inner quadrant of right female breast   08/05/2014 Breast MRI 1.7cm non mass enhancement in the medial right breast   08/24/2014 Initial Biopsy IDA and DCIS, ER 100%, PR 99%, HER2 -, Ki 67% 12%.    08/26/2014 Initial Diagnosis Breast cancer of upper-inner quadrant of right female breast   10/12/2014 Surgery right lumpectomy and SLN biopsy. IDA and DCIS, pT1cN0, negative margins      HISTORY OF PRESENTING ILLNESS:  Destiny Moody 64 y.o. female presents to our breast [place] today for her newly diagnosed breast caner.   She had abnormal screening mammogram in September 2015, underwent biopsy on 06/15/2014 which was negative for malignancy. She developed I small hematoma after biopsy, which required drainage afterwards. She finally had a bilateral breast MRI , which showed a 1.7 cm non-mass enhancement in the medial right breast. She had repeated mammogram on 08/19/2014 which showed again non-mass like enhancement in the right breast  which was biopsied before. 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 She tolerates the biopsy well without any complications.  She feels well overall. She denies any new symptoms lately. She owns a grocery store, is physically very active. She has good appetite and energy level, no recent weight loss.  INTERIM HISTORY: Bekah returns for follow-up. She underwent right breast lumpectomy and sentinel lymph node biopsy on 10/12/2014. She developed some skin redness and pain at the axilla incision site after surgery, it got much worse in the past 2 weeks, and she has severe pain there. She was seen by Dr. Dalbert Batman last Friday and started on oral antibiotics. She has not noticed any improvement since then. She denies fever or chills. She has been back to work 2 days after surgery, and it became small difficult to walk due to the pain at the right axilla.  MEDICAL HISTORY:  Past Medical History  Diagnosis Date  . Hypothyroidism   . Rheumatoid arthritis(714.0)   . Osteopenia   . NIDDM (non-insulin dependent diabetes mellitus)   . Multinodular thyroid   . Hypercholesterolemia   . HSV infection   . Osteoporosis 2015  . Cancer   . Wears contact lenses   . Breast cancer 08/24/14    right, upper inner    SURGICAL HISTORY: Past Surgical History  Procedure Laterality Date  . Cholecystectomy  1993  . Tubal ligation  1996    re annastomosis  . Cesarean section  [id number]  . Colonoscopy    .  Radioactive seed guided mastectomy with axillary sentinel lymph node biopsy Right 10/12/2014    Procedure: RIGHT  PARTIAL MASTECTOMY WITH RADIOACTIVE SEED LOCALIZATION  RIGHT  AXILLARY SENTINEL  NODE BIOPSY;  Surgeon: Fanny Skates, MD;  Location: [facility];  Service: General;  Laterality: Right;    SOCIAL HISTORY: History   Social History  . Marital Status: Married    Spouse Name: [name]  . Number of Children: 2  .  Years of Education: College   Occupational History  .      Self Employed   Social History Main Topics  . Smoking status: Never Smoker   . Smokeless tobacco: Never Used  . Alcohol Use: Yes     Comment: occasionally  . Drug Use: No  . Sexual Activity: Not on file     Comment: menarche age 16, G35 , P 2, menopause age 44, no HRT   Other Topics Concern  . Not on file   Social History Narrative   Patient lives at home with her spouse.   Caffeine use: 1 cup daily   GYN HISTORY  Menarchal: 12 LMP: 42 Contraceptive: 1 yr HRT: no  G2P2:   FAMILY HISTORY: Family History  Problem Relation Age of Onset  . Diabetes Mother     niddm  . Heart disease Mother   . Heart failure Father   . Heart disease Father   . Ovarian cancer Cousin   . Hyperlipidemia Sister     ALLERGIES:  has No Known Allergies.  MEDICATIONS:  Current Outpatient Prescriptions  Medication Sig Dispense Refill  . alendronate (FOSAMAX) 10 MG tablet Take 10 mg by mouth daily before breakfast. Take with a full glass of water on an empty stomach.    Marland Kitchen amitriptyline (ELAVIL) 25 MG tablet Take 25 mg by mouth at bedtime.    Marland Kitchen aspirin 81 MG tablet Take 81 mg by mouth daily.    . Calcium Carbonate-Vitamin D 600-200 MG-UNIT TABS Take 1,200 mg by mouth.    . Exenatide ER 2 MG PEN Inject 2 mg into the skin once a week.    Marland Kitchen glimepiride (AMARYL) 4 MG tablet Take 4 mg by mouth daily with breakfast.     . Glucosamine 750 MG TABS Take by mouth.    Marland Kitchen HYDROcodone-acetaminophen (NORCO) 5-325 MG per tablet Take 1-2 tablets by mouth every 6 (six) hours as needed for moderate pain or severe pain. 30 tablet 0  . lisinopril (PRINIVIL,ZESTRIL) 2.5 MG tablet Take 2.5 mg by mouth daily.      . metFORMIN (GLUMETZA) 1000 MG (MOD) 24 hr tablet Take 1,000 mg by mouth 2 (two) times daily with a meal.     . pioglitazone (ACTOS) 30 MG tablet Take 30 mg by mouth daily.    . simvastatin (ZOCOR) 20 MG tablet Take 20 mg by mouth at bedtime.        No current facility-administered medications for this visit.    REVIEW OF SYSTEMS:   Constitutional: Denies fevers, chills or abnormal night sweats Eyes: Denies blurriness of vision, double vision or watery eyes Ears, nose, mouth, throat, and face: Denies mucositis or sore throat Respiratory: Denies cough, dyspnea or wheezes Cardiovascular: Denies palpitation, chest discomfort or lower extremity swelling Gastrointestinal:  Denies nausea, heartburn or change in bowel habits Skin: Denies abnormal skin rashes Lymphatics: Denies new lymphadenopathy or easy bruising Neurological:Denies numbness, tingling or new weaknesses Behavioral/Psych: Mood is stable, no new changes  All other systems were reviewed with the  patient and are negative.  PHYSICAL EXAMINATION: ECOG PERFORMANCE STATUS: 0 - Asymptomatic  Filed Vitals:   11/22/14 0907  BP: 115/66  Pulse: 91  Temp: 97.6 F (36.4 C)  Resp: 19   Filed Weights   11/22/14 0907  Weight: 154 lb 1.6 oz (69.899 kg)    GENERAL:alert, no distress and comfortable SKIN: skin color, texture, turgor are normal, no rashes or significant lesions EYES: normal, conjunctiva are pink and non-injected, sclera clear OROPHARYNX:no exudate, no erythema and lips, buccal mucosa, and tongue normal  NECK: supple, thyroid normal size, non-tender, without nodularity LYMPH:  no palpable lymphadenopathy in the cervical, axillary or inguinal LUNGS: clear to auscultation and percussion with normal breathing effort HEART: regular rate & rhythm and no murmurs and no lower extremity edema ABDOMEN:abdomen soft, non-tender and normal bowel sounds Musculoskeletal:no cyanosis of digits and no clubbing  PSYCH: alert & oriented x 3 with fluent speech NEURO: no focal motor/sensory deficits Breasts: Breast inspection showed them to be symmetrical with no nipple discharge. The rest breast surgical incision site is clean, nontender no skin erythema. The skin around the right  axillary or surgical incision site very erythematous, tender and there is a large lump below the incision site.  Palpation of the left breasts and axilla revealed no obvious mass that I could appreciate.   LABORATORY DATA:  I have reviewed the data as listed Lab Results  Component Value Date   WBC 8.4 10/11/2014   HGB 13.6 10/11/2014   HCT 39.6 10/11/2014   MCV 91.2 10/11/2014   PLT 295 10/11/2014    Recent Labs  08/10/14 1608 09/01/14 1221 10/11/14 1600  NA  --  140 136  K  --  4.3 3.9  CL  --   --  100  CO2  --  27 28  GLUCOSE  --  114 76  BUN  --  15.5 11  CREATININE  --  0.8 0.62  CALCIUM 9.5 9.9 9.9  GFRNONAA  --   --  >90  GFRAA  --   --  >90  PROT  --  7.1 7.5  ALBUMIN  --  3.9 4.1  AST  --  21 26  ALT  --  26 26  ALKPHOS  --  73 80  BILITOT  --  0.35 0.4   PATHOLOGY REPORT  Diagnosis 10/12/2014 1. Breast, lumpectomy, right - INVASIVE DUCTAL CARCINOMA, SEE COMMENT. - NEGATIVE FOR LYMPH VASCULAR INVASION. - INVASIVE TUMOR IS 0.7 CM FROM NEAREST MARGIN (ANTERIOR). - DUCTAL CARCINOMA IN SITU - PREVIOUS BIOPSY SITE. - SEE TUMOR SYNOPTIC TEMPLATE BELOW. 2. Lymph node, sentinel, biopsy, right axillary - ONE LYMPH NODE, NEGATIVE FOR TUMOR (0/1). 1. BREAST, INVASIVE TUMOR, WITH LYMPH NODES PRESENT Specimen, including laterality and lymph node sampling (sentinel, non-sentinel): Right breast with sentinel lymph node sampling. Procedure: Lumpectomy. Histologic type: Ductal Grade: I of III Tubule formation: 2 Nuclear pleomorphism: 2 Mitotic:1 Tumor size (gross measurement): 1.2 cm Margins: Invasive, distance to closest margin: 0.7 cm In-situ, distance to closest margin: 0.7 cm (anterior) If margin positive, focally or broadly: N/A Lymphovascular invasion: Absent. Ductal carcinoma in situ: Present. Grade: I of III Extensive intraductal component: Absent. Lobular neoplasia: Absent. Tumor focality: Unifocal Treatment effect: None. If present, treatment  effect in breast tissue, lymph nodes or both: N/A Extent of tumor: Skin: N/A Nipple: N/A Skeletal muscle: N/A Lymph nodes: Examined: 1 Sentinel 0 Non-sentinel 1 Total Lymph nodes with metastasis: 0 Isolated tumor cells (< 0.2 mm): N/A Micrometastasis: (>  0.2 mm and < 2.0 mm): N/A Macrometastasis: (> 2.0 mm): N/A Extracapsular extension: N/A Breast prognostic profile: Estrogen receptor: Not repeated previous study demonstrated 100% positivity ([id number]) Progesterone receptor: Not repeated previous study demonstrated 99% positivity ([id number]) Her 2 neu: Repeated, previous study demonstrated no amplification (2.00). Ki-67: Not repeated previous study demonstrated 12% proliferation rate Non-neoplastic breast: Previous biopsy site tissue changes. TNM: pT1c, pN0, pMX Results: HER-2/NEU BY CISH - NEGATIVE. RESULT RATIO OF HER2: CEP 17 SIGNALS 1.13 AVERAGE HER2 COPY NUMBER PER CELL 2.60   RADIOGRAPHIC STUDIES: I have personally reviewed the radiological images as listed and agreed with the findings in the report.  Mr Breast Bilateral W Wo Contrast 08/05/2014    IMPRESSION:  1. Post biopsy changes in the medial right breast at posterior depth. A 1.7 cm AP diameter area of linear clumped non mass enhancement is noted superior and lateral to the biopsy site which represents a good MR correlate to the previously noted mammographic finding.  2. No MR findings of malignancy in the left breast.  3. No adenopathy.    Mammogram and [place] 06/12/2014 IMPRESSION: 1. Persistent asymmetry with mild distortion in the medial aspect of the right breast for which biopsy is recommended. 2. No sonographic correlate is identified for this finding. Stereotactic guided core biopsy is recommended.  ASSESSMENT & PLAN:   64 year old postmenopausal woman with past medical history of diabetes, hypothyroidism, rheumatoid arthritis, who was recently found to have a early stage of right breast cancer.  1.  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 Grade 1, ER 100% positive, PR 99% positive, HER-2 negative, Ki-67 12%. Oncotype recurrence score 14 -She is status post lumpectomy and sentinel lymph node biopsy with negative surgical margins. -Her early stage breast cancer is likely cured by surgery alone, but does carry some risks of cancer recurrence after surgery. -I discussed the Oncotype DX test result with her in details. The recurrence score 14 predicts 10 year risk of distant recurrence 9% with tamoxifen alone. There is no benefit of adjuvant chemotherapy in patients with this low recurrence score. -She had a bone density scan last year, I'll obtain a report from her gynecologist.  -I recommend adjuvant endocrine therapy with aromatase inhibitor, such as Aromasin, to reduce her risks of cancer recurrence after surgery. - -She will follow-up with Dr. Unknown Jim to discuss adjuvant radiation after lumpectomy, which is likely can be postponed due to her right axilla infection.  2. Right axilla surgical incision infection, possible abscess -She is going to follow-up with Dr. Dalbert Batman  tomorrow. -I sent a message to Dr. Dalbert Batman, and I'm concerned about this could be abscess.   3. Dm, RA, hypothyroidism -She will continue to follow-up with her primary care physician  Plan -I'll see her back when she completes adjuvant irradiation. -I schedule her return up appointment in 2 months, she will call me if she completes radiation earlier -I will obtain her bone density scan from Dr. Toney Rakes  All questions were answered. The patient knows to call the clinic with any problems, questions or concerns. I spent 30 minutes counseling the patient face to face. The total time spent in the appointment was 30 minutes and more than 50% was on counseling.     Truitt Merle, MD 11/22/2014 9:46 AM   Addendum: I received her bone density scan report 08/16/2014 Conclusion: Osteoporosis, T  score -2.6 at left femoral neck, -2.1 at right femoral neck. She is on Fosamax.  Truitt Merle  11/24/2014   

## 2014-11-22 NOTE — Addendum Note (Signed)
Addended by: Jesse Fall on: 11/22/2014 10:25 AM   Modules accepted: Medications

## 2014-11-22 NOTE — Telephone Encounter (Signed)
Pt confirmed labs/ov per 03/07 POF, gave pt AVS... KJ  °

## 2014-11-23 ENCOUNTER — Encounter (HOSPITAL_COMMUNITY): Payer: Self-pay | Admitting: *Deleted

## 2014-11-23 MED ORDER — CEFAZOLIN SODIUM-DEXTROSE 2-3 GM-% IV SOLR
2.0000 g | INTRAVENOUS | Status: AC
  Administered 2014-11-24: 2 g via INTRAVENOUS
  Filled 2014-11-23: qty 50

## 2014-11-23 NOTE — Anesthesia Preprocedure Evaluation (Addendum)
Anesthesia Evaluation  Patient identified by MRN, date of birth, ID band Patient awake    Reviewed: Allergy & Precautions, NPO status , Patient's Chart, lab work & pertinent test results  Airway Mallampati: II   Neck ROM: Full    Dental  (+) Teeth Intact, Dental Advisory Given   Pulmonary neg pulmonary ROS,  breath sounds clear to auscultation        Cardiovascular hypertension, Pt. on medications Rhythm:Regular     Neuro/Psych    GI/Hepatic GERD-  Medicated,  Endo/Other  diabetes, Type 2, Oral Hypoglycemic Agents  Renal/GU      Musculoskeletal   Abdominal (+)  Abdomen: soft.    Peds  Hematology   Anesthesia Other Findings   Reproductive/Obstetrics                            Anesthesia Physical Anesthesia Plan  ASA: II  Anesthesia Plan: General   Post-op Pain Management:    Induction: Intravenous  Airway Management Planned: LMA and Oral ETT  Additional Equipment:   Intra-op Plan:   Post-operative Plan:   Informed Consent: I have reviewed the patients History and Physical, chart, labs and discussed the procedure including the risks, benefits and alternatives for the proposed anesthesia with the patient or authorized representative who has indicated his/her understanding and acceptance.     Plan Discussed with:   Anesthesia Plan Comments: (Check am labs)        Anesthesia Quick Evaluation

## 2014-11-23 NOTE — H&P (Signed)
Destiny Moody  Location: [facility] Patient #: [id number] DOB: [birth year] Married / Language: Spanish / Race: White Female       History of Present Illness  Patient words: recheck axilla abcsess.  The patient is a 64 year old female who presents with a complaint of right axillary infection, postop.  On 10/12/2014 she underwent right partial mastectomy and sentinel node biopsy. She had a stage TIc, N0, receptor positive, HER-2 negative cancer.  She has been followed because of a small hematoma in the right axilla. Aspiration was attempted but this was felt to be a clotted hematoma. She saw Dr. Zella Richer 3 days ago. The right axilla was pink and tender and looked infected.. Needle aspiration revealed only minimal blood. She was placed on doxycycline. Today she says the pain is worse. I told her that the swelling was worse and that she would need to have this area drained in the operating room promptly. She is in full agreement. She saw Dr. Burr Medico today . She has appointment with Dr. Pablo Ledger in the near future.Marland Kitchen She will be scheduled for exploration and drainage of right axillary infection in the operating room on 11/24/2014. I discussed the indications, details, techniques, and numerous risk of surgery with her. She is aware the risks of bleeding, infection, recurrent infection, nerve damage with chronic pain or numbness, arm swelling, and other on proceeding problems. She understands all of this. All of her questions are answered. She agrees with this plan.   Allergies  No Known Drug Allergies02/10/2014  Medication History Doxycycline Hyclate (100MG  Capsule, 1 (one) Capsule Oral two times daily, Taken starting 11/19/2014) Active. Norco (5-325MG  Tablet, Oral) Active. Lisinopril (2.5MG  Tablet, Oral) Active. Fosamax (10MG  Tablet, Oral) Active. Elavil (25MG  Tablet, Oral) Active. Aspirin EC (81MG  Tablet DR, Oral) Active. Calcium Carbonate-Vit D-Min  (600-200MG -UNIT Tablet, Oral) Active. Exenatide ER (2MG  Pen-injector, Subcutaneous) Active. Amaryl (4MG  Tablet, Oral) Active. Glucosamine Sulfate (500MG  Tablet, Oral) Active. Glucosamine Sulfate (750MG  Tablet, Oral) Active. MetFORMIN HCl ER (MOD) (1000MG  Tablet ER 24HR, Oral) Active. Actos (30MG  Tablet, Oral) Active. Zocor (20MG  Tablet, Oral) Active. Alendronate Sodium (70MG  Tablet, Oral) Active. Amitriptyline HCl (25MG  Tablet, Oral) Active. Bydureon (2MG  For Suspension, Subcutaneous) Active. Fluticasone Propionate (50MCG/ACT Suspension, Nasal) Active. Glimepiride (4MG  Tablet, Oral) Active. MetFORMIN HCl (1000MG  Tablet, Oral) Active. Simvastatin (20MG  Tablet, Oral) Active. Pioglitazone HCl (30MG  Tablet, Oral) Active. Medications Reconciled  Vitals   Weight: 154 lb Height: 62in Body Surface Area: 1.75 m Body Mass Index: 28.17 kg/m Temp.: 96.36F(Temporal)  Pulse: 97 (Regular)  Resp.: 16 (Unlabored)  BP: 142/68 (Sitting, Left Arm, Standard)    Physical Exam General Note: Alert. Mild distress. A little frustrated.   Chest and Lung Exam Note: Clear to auscultation bilaterally   Breast Note: Right lumpectomy incision is healing normally. No sign of infection or hematoma. Right axilla is erythematous and more swollen. It may be getting a little fluctuant.   Cardiovascular Note: Regular rate and rhythm. No murmur. No ectopy.   Abdomen Note: well healed Pfannenstiel incision. Soft nontender     Assessment & Plan POSTOPERATIVE WOUND INFECTION, INITIAL ENCOUNTER (998.59  T81.4XXA)  Current Plans:    Schedule for Surgery The infection in your right axilla has not gotten any better despite the antibiotics. We have discussed options for opening up the wound. you will be taken to the operating room in the next 24-48 hours for drainage of your right axillary infection under general anesthesia. you should be able to go home the same  day Please  check your blood sugars 2 or 3 times a day to make sure they are well controlled. We have discussed the techniques and risk of the surgery in detail  HEMATOMA OF RIGHT AXILLA, SUBSEQUENT ENCOUNTER (V58.89  S40.021D)  PRIMARY CANCER OF UPPER INNER QUADRANT OF RIGHT FEMALE BREAST (174.2  C50.211)  DIABETES TYPE 2, CONTROLLED (250.00  E11.9) HYPERTENSION, BENIGN (401.1  I10)   Jude Linck M. Dalbert Batman, M.D., [facility] General and Minimally invasive Surgery Breast and Colorectal Surgery Office:   [phone] Pager:   [phone]

## 2014-11-24 ENCOUNTER — Encounter (HOSPITAL_COMMUNITY)
Admission: RE | Disposition: A | Discharge status: Home / Self Care | Payer: Self-pay | Source: Ambulatory Visit | Attending: General Surgery

## 2014-11-24 ENCOUNTER — Ambulatory Visit (HOSPITAL_COMMUNITY): Payer: 59 | Admitting: Anesthesiology

## 2014-11-24 ENCOUNTER — Ambulatory Visit (HOSPITAL_COMMUNITY)
Admission: RE | Admit: 2014-11-24 | Discharge: 2014-11-24 | Disposition: A | Discharge status: Home / Self Care | Payer: 59 | Source: Ambulatory Visit | Attending: General Surgery | Admitting: General Surgery

## 2014-11-24 ENCOUNTER — Encounter (HOSPITAL_COMMUNITY): Payer: Self-pay | Admitting: Anesthesiology

## 2014-11-24 DIAGNOSIS — Z79899 Other long term (current) drug therapy: Secondary | ICD-10-CM | POA: Insufficient documentation

## 2014-11-24 DIAGNOSIS — I10 Essential (primary) hypertension: Secondary | ICD-10-CM | POA: Insufficient documentation

## 2014-11-24 DIAGNOSIS — K219 Gastro-esophageal reflux disease without esophagitis: Secondary | ICD-10-CM | POA: Diagnosis not present

## 2014-11-24 DIAGNOSIS — Z853 Personal history of malignant neoplasm of breast: Secondary | ICD-10-CM | POA: Diagnosis not present

## 2014-11-24 DIAGNOSIS — E119 Type 2 diabetes mellitus without complications: Secondary | ICD-10-CM | POA: Insufficient documentation

## 2014-11-24 DIAGNOSIS — Z7982 Long term (current) use of aspirin: Secondary | ICD-10-CM | POA: Insufficient documentation

## 2014-11-24 DIAGNOSIS — L02411 Cutaneous abscess of right axilla: Secondary | ICD-10-CM | POA: Diagnosis not present

## 2014-11-24 HISTORY — PX: INCISION AND DRAINAGE ABSCESS: SHX5864

## 2014-11-24 LAB — BASIC METABOLIC PANEL
Anion gap: 9 (ref 5–15)
BUN: 15 mg/dL (ref 6–23)
CALCIUM: 9.4 mg/dL (ref 8.4–10.5)
CO2: 23 mmol/L (ref 19–32)
CREATININE: 0.7 mg/dL (ref 0.50–1.10)
Chloride: 107 mmol/L (ref 96–112)
GFR calc Af Amer: >90 mL/min (ref >90)
GFR calc non Af Amer: >90 mL/min (ref >90)
Glucose, Bld: 121 mg/dL — ABNORMAL HIGH (ref 70–99)
Potassium: 3.8 mmol/L (ref 3.5–5.1)
Sodium: 139 mmol/L (ref 135–145)

## 2014-11-24 LAB — CBC
HCT: 33.8 % — ABNORMAL LOW (ref 36.0–46.0)
HEMOGLOBIN: 11.9 g/dL — AB (ref 12.0–15.0)
MCH: 31.6 pg (ref 26.0–34.0)
MCHC: 35.2 g/dL (ref 30.0–36.0)
MCV: 89.9 fL (ref 78.0–100.0)
Platelets: 308 10*3/uL (ref 150–400)
RBC: 3.76 MIL/uL — ABNORMAL LOW (ref 3.87–5.11)
RDW: 12.7 % (ref 11.5–15.5)
WBC: 7.7 10*3/uL (ref 4.0–10.5)

## 2014-11-24 LAB — GLUCOSE, CAPILLARY: GLUCOSE-CAPILLARY: 87 mg/dL (ref 70–99)

## 2014-11-24 SURGERY — INCISION AND DRAINAGE, ABSCESS
Anesthesia: General | Site: Axilla | Laterality: Right

## 2014-11-24 MED ORDER — ROCURONIUM BROMIDE 50 MG/5ML IV SOLN
INTRAVENOUS | Status: AC
  Filled 2014-11-24: qty 1

## 2014-11-24 MED ORDER — MIDAZOLAM HCL 5 MG/5ML IJ SOLN
INTRAMUSCULAR | Status: DC | PRN
  Administered 2014-11-24 (×2): 1 mg via INTRAVENOUS

## 2014-11-24 MED ORDER — OXYCODONE HCL 5 MG PO TABS
ORAL_TABLET | ORAL | Status: AC
  Filled 2014-11-24: qty 2

## 2014-11-24 MED ORDER — LIDOCAINE HCL (CARDIAC) 20 MG/ML IV SOLN
INTRAVENOUS | Status: DC | PRN
  Administered 2014-11-24: 100 mg via INTRAVENOUS

## 2014-11-24 MED ORDER — LACTATED RINGERS IV SOLN
INTRAVENOUS | Status: DC | PRN
  Administered 2014-11-24: 08:00:00 via INTRAVENOUS

## 2014-11-24 MED ORDER — DOXYCYCLINE HYCLATE 100 MG PO TABS: 100.0000 mg | ORAL_TABLET | Freq: Two times a day (BID) | ORAL | Status: DC

## 2014-11-24 MED ORDER — PROPOFOL 10 MG/ML IV BOLUS
INTRAVENOUS | Status: DC | PRN
  Administered 2014-11-24: 150 mg via INTRAVENOUS

## 2014-11-24 MED ORDER — CEFAZOLIN SODIUM-DEXTROSE 2-3 GM-% IV SOLR
INTRAVENOUS | Status: AC
  Filled 2014-11-24: qty 150

## 2014-11-24 MED ORDER — FENTANYL CITRATE 0.05 MG/ML IJ SOLN
INTRAMUSCULAR | Status: AC
  Filled 2014-11-24: qty 5

## 2014-11-24 MED ORDER — FENTANYL CITRATE 0.05 MG/ML IJ SOLN
25.0000 ug | INTRAMUSCULAR | Status: DC | PRN
  Administered 2014-11-24 (×2): 50 ug via INTRAVENOUS

## 2014-11-24 MED ORDER — LIDOCAINE HCL (CARDIAC) 20 MG/ML IV SOLN
INTRAVENOUS | Status: AC
  Filled 2014-11-24: qty 5

## 2014-11-24 MED ORDER — CHLORHEXIDINE GLUCONATE 4 % EX LIQD
1.0000 "application " | Freq: Once | CUTANEOUS | Status: DC
  Filled 2014-11-24: qty 15

## 2014-11-24 MED ORDER — EPHEDRINE SULFATE 50 MG/ML IJ SOLN
INTRAMUSCULAR | Status: AC
  Filled 2014-11-24: qty 1

## 2014-11-24 MED ORDER — GLYCOPYRROLATE 0.2 MG/ML IJ SOLN
INTRAMUSCULAR | Status: AC
  Filled 2014-11-24: qty 1

## 2014-11-24 MED ORDER — FENTANYL CITRATE 0.05 MG/ML IJ SOLN
INTRAMUSCULAR | Status: DC | PRN
  Administered 2014-11-24: 50 ug via INTRAVENOUS
  Administered 2014-11-24: 25 ug via INTRAVENOUS

## 2014-11-24 MED ORDER — OXYCODONE HCL 5 MG PO TABS
5.0000 mg | ORAL_TABLET | ORAL | Status: DC | PRN
  Administered 2014-11-24: 10 mg via ORAL

## 2014-11-24 MED ORDER — HYDROCODONE-ACETAMINOPHEN 5-325 MG PO TABS: 1.0000 | ORAL_TABLET | Freq: Four times a day (QID) | ORAL | Status: DC | PRN

## 2014-11-24 MED ORDER — SUCCINYLCHOLINE CHLORIDE 20 MG/ML IJ SOLN
INTRAMUSCULAR | Status: AC
  Filled 2014-11-24: qty 1

## 2014-11-24 MED ORDER — 0.9 % SODIUM CHLORIDE (POUR BTL) OPTIME
TOPICAL | Status: DC | PRN
  Administered 2014-11-24: 1000 mL

## 2014-11-24 MED ORDER — ONDANSETRON HCL 4 MG/2ML IJ SOLN
INTRAMUSCULAR | Status: AC
  Filled 2014-11-24: qty 4

## 2014-11-24 MED ORDER — FENTANYL CITRATE 0.05 MG/ML IJ SOLN
INTRAMUSCULAR | Status: AC
  Administered 2014-11-24: 50 ug via INTRAVENOUS
  Filled 2014-11-24: qty 2

## 2014-11-24 MED ORDER — MIDAZOLAM HCL 2 MG/2ML IJ SOLN
INTRAMUSCULAR | Status: AC
  Filled 2014-11-24: qty 2

## 2014-11-24 MED ORDER — DIPHENHYDRAMINE HCL 50 MG/ML IJ SOLN
INTRAMUSCULAR | Status: AC
  Filled 2014-11-24: qty 1

## 2014-11-24 MED ORDER — STERILE WATER FOR INJECTION IJ SOLN
INTRAMUSCULAR | Status: AC
  Filled 2014-11-24: qty 10

## 2014-11-24 MED ORDER — MEPERIDINE HCL 25 MG/ML IJ SOLN: 6.2500 mg | INTRAMUSCULAR | Status: DC | PRN

## 2014-11-24 MED ORDER — PROMETHAZINE HCL 25 MG/ML IJ SOLN: 6.2500 mg | INTRAMUSCULAR | Status: DC | PRN

## 2014-11-24 MED ORDER — PROPOFOL 10 MG/ML IV BOLUS
INTRAVENOUS | Status: AC
  Filled 2014-11-24: qty 20

## 2014-11-24 MED ORDER — ONDANSETRON HCL 4 MG/2ML IJ SOLN
INTRAMUSCULAR | Status: DC | PRN
  Administered 2014-11-24: 4 mg via INTRAVENOUS

## 2014-11-24 SURGICAL SUPPLY — 28 items
BLADE SURG ROTATE 9660 (MISCELLANEOUS) IMPLANT
CANISTER SUCTION 2500CC (MISCELLANEOUS) ×3 IMPLANT
CHLORAPREP W/TINT 26ML (MISCELLANEOUS) IMPLANT
COVER SURGICAL LIGHT HANDLE (MISCELLANEOUS) ×3 IMPLANT
DRAPE LAPAROSCOPIC ABDOMINAL (DRAPES) ×3 IMPLANT
DRAPE UTILITY XL STRL (DRAPES) ×6 IMPLANT
ELECT CAUTERY BLADE 6.4 (BLADE) ×3 IMPLANT
ELECT REM PT RETURN 9FT ADLT (ELECTROSURGICAL) ×3
ELECTRODE REM PT RTRN 9FT ADLT (ELECTROSURGICAL) ×1 IMPLANT
GAUZE PACKING IODOFORM 1X5 (MISCELLANEOUS) ×2 IMPLANT
GAUZE PACKING IODOFORM 2 (PACKING) ×2 IMPLANT
GAUZE SPONGE 4X4 12PLY STRL (GAUZE/BANDAGES/DRESSINGS) ×3 IMPLANT
GLOVE EUDERMIC 7 POWDERFREE (GLOVE) ×3 IMPLANT
GOWN STRL REUS W/ TWL LRG LVL3 (GOWN DISPOSABLE) ×1 IMPLANT
GOWN STRL REUS W/ TWL XL LVL3 (GOWN DISPOSABLE) ×1 IMPLANT
GOWN STRL REUS W/TWL LRG LVL3 (GOWN DISPOSABLE) ×3
GOWN STRL REUS W/TWL XL LVL3 (GOWN DISPOSABLE) ×3
KIT BASIN OR (CUSTOM PROCEDURE TRAY) ×3 IMPLANT
KIT ROOM TURNOVER OR (KITS) ×3 IMPLANT
NS IRRIG 1000ML POUR BTL (IV SOLUTION) ×3 IMPLANT
PACK GENERAL/GYN (CUSTOM PROCEDURE TRAY) ×3 IMPLANT
PAD ARMBOARD 7.5X6 YLW CONV (MISCELLANEOUS) ×6 IMPLANT
SWAB COLLECTION DEVICE MRSA (MISCELLANEOUS) ×3 IMPLANT
TAPE CLOTH SURG 4X10 WHT LF (GAUZE/BANDAGES/DRESSINGS) ×2 IMPLANT
TOWEL OR 17X24 6PK STRL BLUE (TOWEL DISPOSABLE) ×3 IMPLANT
TOWEL OR 17X26 10 PK STRL BLUE (TOWEL DISPOSABLE) ×3 IMPLANT
TUBE ANAEROBIC SPECIMEN COL (MISCELLANEOUS) ×3 IMPLANT
WATER STERILE IRR 1000ML POUR (IV SOLUTION) ×3 IMPLANT

## 2014-11-24 NOTE — Anesthesia Postprocedure Evaluation (Signed)
  Anesthesia Post-op Note  Patient: Destiny Moody  Procedure(s) Performed: Procedure(s): INCISION AND DRAINAGE RIGHT AXILLA  ABSCESS (Right)  Patient Location: PACU  Anesthesia Type:General  Level of Consciousness: awake and alert   Airway and Oxygen Therapy: Patient Spontanous Breathing and Patient connected to nasal cannula oxygen  Post-op Pain: none  Post-op Assessment: Post-op Vital signs reviewed, Patient's Cardiovascular Status Stable, Respiratory Function Stable, Patent Airway and No signs of Nausea or vomiting  Post-op Vital Signs: Reviewed and stable  Last Vitals:  Filed Vitals:   11/24/14 0747  BP: 117/38  Pulse: 75  Temp: 36.6 C  Resp: 18    Complications: No apparent anesthesia complications

## 2014-11-24 NOTE — Anesthesia Procedure Notes (Signed)
Procedure Name: LMA Insertion Date/Time: 11/24/2014 8:33 AM Performed by: Scheryl Darter Pre-anesthesia Checklist: Patient identified, Emergency Drugs available, Suction available, Patient being monitored and Timeout performed Patient Re-evaluated:Patient Re-evaluated prior to inductionOxygen Delivery Method: Circle system utilized Preoxygenation: Pre-oxygenation with 100% oxygen Intubation Type: IV induction Ventilation: Mask ventilation without difficulty LMA: LMA inserted LMA Size: 4.0 Number of attempts: 1 Placement Confirmation: positive ETCO2 and breath sounds checked- equal and bilateral Tube secured with: Tape Dental Injury: Teeth and Oropharynx as per pre-operative assessment

## 2014-11-24 NOTE — Op Note (Signed)
Patient Name:           Destiny Moody   Date of Surgery:        11/24/2014  Pre op Diagnosis:      Right axillary abscess  Post op Diagnosis:    Right axillary abscess  Procedure:                 Incision and drainage right axillary abscess  Surgeon:                     Edsel Petrin. Dalbert Batman, M.D., FACS  Assistant:                      OR staff  Operative Indications:   .  On 10/12/2014 she underwent right partial mastectomy and sentinel node biopsy. She had a stage TIc, N0, receptor positive, HER-2 negative cancer.  She has been followed because of a small hematoma in the right axilla. Aspiration was attempted but this was felt to be a clotted hematoma. She saw Dr. Zella Richer 3 days ago. The right axilla was pink and tender and looked infected.. Needle aspiration revealed only minimal blood. She was placed on doxycycline. Today she says the pain is worse. I told her that the swelling was worse and that she would need to have this area drained in the operating room promptly. She completely agrees. This morning she states that she had some spontaneous drainage yesterday and she feels somewhat better but still in pain   Operative Findings:       There was an axillary abscess. The cavity was about 3-1/2 cm in diameter. There was some residual purulence which was cultured. No residual hematoma noted.  Procedure in Detail:          Following the induction of general LMA anesthesia the patient's right axilla was prepped and draped in a sterile fashion. Surgical timeout was performed. Needle aspiration was attempted but I got very little fluid. I made an incision through the old scar and entered the abscess cavity. It was cultured. This was irrigated extensively. I digitally explored this to make sure there was no undrained collection and it felt fine. Bleeders were cauterized. Irrigated 1 more time. Packed loosely with 1 inch iodoform gauze and dry bandage. She tolerated the procedure well was  taken to PACU in stable condition. EBL 15 mL. Counts correct. Complications none.     Edsel Petrin. Dalbert Batman, M.D., FACS General and Minimally Invasive Surgery Breast and Colorectal Surgery  11/24/2014 8:58 AM

## 2014-11-24 NOTE — Progress Notes (Signed)
EKG order  Discontinued due to having EKG stating NSR within 1 year.

## 2014-11-24 NOTE — Interval H&P Note (Signed)
History and Physical Interval Note:  11/24/2014 7:45 AM  Destiny Moody  has presented today for surgery, with the diagnosis of Right axilla abscess  The various methods of treatment have been discussed with the patient and family. After consideration of risks, benefits and other options for treatment, the patient has consented to  Procedure(s): INCISION AND DRAINAGE RIGHT AXILLA  ABSCESS (Right) as a surgical intervention .  The patient's history has been reviewed, patient examined today, no change in status, stable for surgery.  I have reviewed the patient's chart and labs.  Questions were answered to the patient's satisfaction.     Adin Hector

## 2014-11-24 NOTE — Addendum Note (Signed)
Addendum  created 11/24/14 1348 by Scheryl Darter, CRNA   Modules edited: Anesthesia Medication Administration

## 2014-11-24 NOTE — Discharge Instructions (Signed)
Continue to take the doxycycline for another 7 days. I have written you a new prescription  You have a prescription for hydrocodone for pain. This will be given to you today  Call Dr. Darrel Hoover office and make an appointment to be seen by one of the physicians this Friday, 11/26/2014. Dr. Dalbert Batman will be out of town   If you need to change the bandage, just change the bandages on top of the skin but leave the packing in place.   Call if there are any problems.

## 2014-11-24 NOTE — Transfer of Care (Signed)
Immediate Anesthesia Transfer of Care Note  Patient: Destiny Moody  Procedure(s) Performed: Procedure(s): INCISION AND DRAINAGE RIGHT AXILLA  ABSCESS (Right)  Patient Location: PACU  Anesthesia Type:General  Level of Consciousness: awake, alert , oriented and sedated  Airway & Oxygen Therapy: Patient Spontanous Breathing and Patient connected to nasal cannula oxygen  Post-op Assessment: Report given to RN, Post -op Vital signs reviewed and stable and Patient moving all extremities  Post vital signs: Reviewed and stable  Last Vitals:  Filed Vitals:   11/24/14 0747  BP: 117/38  Pulse: 75  Temp: 36.6 C  Resp: 18    Complications: No apparent anesthesia complications

## 2014-11-25 ENCOUNTER — Encounter (HOSPITAL_COMMUNITY): Payer: Self-pay | Admitting: General Surgery

## 2014-11-27 LAB — CULTURE, ROUTINE-ABSCESS

## 2014-12-20 ENCOUNTER — Telehealth: Payer: Self-pay

## 2014-12-20 NOTE — Telephone Encounter (Signed)
Called patient to check on status of healing from surgery.She will follow up with Dr.Ingram on 01/06/15 at 8:15 am.

## 2015-01-17 ENCOUNTER — Encounter: Payer: Self-pay | Admitting: Hematology

## 2015-01-17 ENCOUNTER — Encounter: Payer: Self-pay | Admitting: Family Medicine

## 2015-01-17 ENCOUNTER — Ambulatory Visit (INDEPENDENT_AMBULATORY_CARE_PROVIDER_SITE_OTHER): Payer: 59 | Admitting: Family Medicine

## 2015-01-17 ENCOUNTER — Ambulatory Visit (HOSPITAL_BASED_OUTPATIENT_CLINIC_OR_DEPARTMENT_OTHER): Payer: 59 | Admitting: Hematology

## 2015-01-17 ENCOUNTER — Other Ambulatory Visit (HOSPITAL_BASED_OUTPATIENT_CLINIC_OR_DEPARTMENT_OTHER): Payer: 59

## 2015-01-17 VITALS — BP 108/66 | HR 101 | Temp 98.5°F | Resp 16 | Ht 62.75 in | Wt 151.0 lb

## 2015-01-17 VITALS — BP 128/85 | Temp 98.0°F | Resp 20 | Ht 63.0 in | Wt 151.4 lb

## 2015-01-17 DIAGNOSIS — M810 Age-related osteoporosis without current pathological fracture: Secondary | ICD-10-CM

## 2015-01-17 DIAGNOSIS — Z23 Encounter for immunization: Secondary | ICD-10-CM

## 2015-01-17 DIAGNOSIS — J309 Allergic rhinitis, unspecified: Secondary | ICD-10-CM

## 2015-01-17 DIAGNOSIS — Z1211 Encounter for screening for malignant neoplasm of colon: Secondary | ICD-10-CM | POA: Diagnosis not present

## 2015-01-17 DIAGNOSIS — E162 Hypoglycemia, unspecified: Secondary | ICD-10-CM | POA: Diagnosis not present

## 2015-01-17 DIAGNOSIS — C50911 Malignant neoplasm of unspecified site of right female breast: Secondary | ICD-10-CM

## 2015-01-17 DIAGNOSIS — I10 Essential (primary) hypertension: Secondary | ICD-10-CM

## 2015-01-17 DIAGNOSIS — C50211 Malignant neoplasm of upper-inner quadrant of right female breast: Secondary | ICD-10-CM

## 2015-01-17 DIAGNOSIS — E039 Hypothyroidism, unspecified: Secondary | ICD-10-CM

## 2015-01-17 DIAGNOSIS — E785 Hyperlipidemia, unspecified: Secondary | ICD-10-CM

## 2015-01-17 DIAGNOSIS — Z170 Estrogen receptor positive status [ER+]: Secondary | ICD-10-CM | POA: Diagnosis not present

## 2015-01-17 DIAGNOSIS — R51 Headache: Secondary | ICD-10-CM

## 2015-01-17 DIAGNOSIS — R519 Headache, unspecified: Secondary | ICD-10-CM

## 2015-01-17 DIAGNOSIS — Z8673 Personal history of transient ischemic attack (TIA), and cerebral infarction without residual deficits: Secondary | ICD-10-CM | POA: Diagnosis not present

## 2015-01-17 DIAGNOSIS — E119 Type 2 diabetes mellitus without complications: Secondary | ICD-10-CM

## 2015-01-17 DIAGNOSIS — Z0000 Encounter for general adult medical examination without abnormal findings: Secondary | ICD-10-CM | POA: Diagnosis not present

## 2015-01-17 LAB — CBC WITH DIFFERENTIAL/PLATELET
BASO%: 0.3 % (ref 0.0–2.0)
Basophils Absolute: 0 10*3/uL (ref 0.0–0.1)
EOS%: 2.9 % (ref 0.0–7.0)
Eosinophils Absolute: 0.2 10*3/uL (ref 0.0–0.5)
HCT: 37.1 % (ref 34.8–46.6)
HEMOGLOBIN: 12.5 g/dL (ref 11.6–15.9)
LYMPH%: 33.9 % (ref 14.0–49.7)
MCH: 30.7 pg (ref 25.1–34.0)
MCHC: 33.7 g/dL (ref 31.5–36.0)
MCV: 91.2 fL (ref 79.5–101.0)
MONO#: 0.5 10*3/uL (ref 0.1–0.9)
MONO%: 7.1 % (ref 0.0–14.0)
NEUT%: 55.8 % (ref 38.4–76.8)
NEUTROS ABS: 3.9 10*3/uL (ref 1.5–6.5)
PLATELETS: 246 10*3/uL (ref 145–400)
RBC: 4.07 10*6/uL (ref 3.70–5.45)
RDW: 13.1 % (ref 11.2–14.5)
WBC: 6.9 10*3/uL (ref 3.9–10.3)
lymph#: 2.3 10*3/uL (ref 0.9–3.3)

## 2015-01-17 LAB — COMPREHENSIVE METABOLIC PANEL (CC13)
ALT: 25 U/L (ref 0–55)
AST: 19 U/L (ref 5–34)
Albumin: 3.7 g/dL (ref 3.5–5.0)
Alkaline Phosphatase: 84 U/L (ref 40–150)
Anion Gap: 10 mEq/L (ref 3–11)
BUN: 14.4 mg/dL (ref 7.0–26.0)
CO2: 22 mEq/L (ref 22–29)
Calcium: 9 mg/dL (ref 8.4–10.4)
Chloride: 108 mEq/L (ref 98–109)
Creatinine: 0.7 mg/dL (ref 0.6–1.1)
EGFR: >90 mL/min/{1.73_m2} (ref >90)
Glucose: 136 mg/dl (ref 70–140)
POTASSIUM: 4.2 meq/L (ref 3.5–5.1)
Sodium: 140 mEq/L (ref 136–145)
Total Bilirubin: 0.32 mg/dL (ref 0.20–1.20)
Total Protein: 6.8 g/dL (ref 6.4–8.3)

## 2015-01-17 LAB — POCT GLYCOSYLATED HEMOGLOBIN (HGB A1C): Hemoglobin A1C: 5.9

## 2015-01-17 LAB — GLUCOSE, POCT (MANUAL RESULT ENTRY): POC Glucose: 215 mg/dl — AB (ref 70–99)

## 2015-01-17 MED ORDER — EXENATIDE ER 2 MG ~~LOC~~ PEN: 2.0000 mg | PEN_INJECTOR | SUBCUTANEOUS | Status: DC

## 2015-01-17 MED ORDER — FLUTICASONE PROPIONATE 50 MCG/ACT NA SUSP: 2.0000 | Freq: Every day | NASAL | Status: DC | PRN

## 2015-01-17 MED ORDER — METFORMIN HCL ER (MOD) 1000 MG PO TB24: 1000.0000 mg | ORAL_TABLET | Freq: Two times a day (BID) | ORAL | Status: DC

## 2015-01-17 MED ORDER — SIMVASTATIN 20 MG PO TABS: 20.0000 mg | ORAL_TABLET | Freq: Every day | ORAL | Status: DC

## 2015-01-17 MED ORDER — GLIMEPIRIDE 4 MG PO TABS: 4.0000 mg | ORAL_TABLET | Freq: Every day | ORAL | Status: DC

## 2015-01-17 MED ORDER — LISINOPRIL 2.5 MG PO TABS: 2.5000 mg | ORAL_TABLET | Freq: Every day | ORAL | Status: DC

## 2015-01-17 MED ORDER — ZOSTER VACCINE LIVE 19400 UNT/0.65ML ~~LOC~~ SOLR: 0.6500 mL | Freq: Once | SUBCUTANEOUS | Status: DC

## 2015-01-17 MED ORDER — AMITRIPTYLINE HCL 25 MG PO TABS: 25.0000 mg | ORAL_TABLET | Freq: Every day | ORAL | Status: DC

## 2015-01-17 MED ORDER — GLIMEPIRIDE 2 MG PO TABS
2.0000 mg | ORAL_TABLET | Freq: Every day | ORAL | Status: DC
Start: 2015-01-17 — End: 2015-08-15

## 2015-01-17 NOTE — Progress Notes (Signed)
   Subjective:    Patient ID: Destiny Moody, female    DOB: [birth year], 64 y.o.   MRN: [id number]  HPI    Review of Systems  Constitutional: Positive for appetite change and unexpected weight change.  HENT: Positive for dental problem and tinnitus.   Eyes: Negative.   Respiratory: Negative.   Cardiovascular: Negative.   Gastrointestinal: Positive for nausea and constipation.  Endocrine: Positive for polydipsia and polyuria.  Genitourinary: Negative.   Musculoskeletal: Negative.   Skin: Negative.   Allergic/Immunologic: Negative.   Neurological: Positive for dizziness and light-headedness.  Hematological: Negative.   Psychiatric/Behavioral: Positive for dysphoric mood.       Objective:   Physical Exam        Assessment & Plan:

## 2015-01-17 NOTE — Progress Notes (Signed)
[facility]  [phone] [phone] [place] Note   Patient Care Team: Wendie Agreste, MD as PCP - General ([place]) Terrance Mass, MD as Consulting Physician (Gynecology) Fanny Skates, MD as Consulting Physician (General Surgery) Truitt Merle, MD as Consulting Physician (Hematology) Thea Silversmith, MD as Consulting Physician (Radiation Oncology) Holley Bouche, NP as Nurse Practitioner (Nurse Practitioner) 01/17/2015  CHIEF COMPLAINTS/PURPOSE OF CONSULTATION:  Newly diagnosed Left breast IDA and DCIS   Oncology History   Breast cancer of upper-inner quadrant of right female breast   Staging form: Breast, AJCC 7th Edition     Clinical stage from 09/01/2014: Stage IA (T1c, N0, M0) - Unsigned     Pathologic stage from 10/14/2014: Stage IA (T1c, N0, cM0) - Signed by Seward Grater, MD on 10/19/2014       Staging comments: Staged on final lumpectomy specimen by Dr. Donato Heinz        Breast cancer of upper-inner quadrant of right female breast   08/05/2014 Breast MRI 1.7cm non mass enhancement in the medial right breast   08/24/2014 Initial Biopsy IDA and DCIS, ER 100%, PR 99%, HER2 -, Ki 67% 12%.    08/26/2014 Initial Diagnosis Breast cancer of upper-inner quadrant of right female breast   10/12/2014 Surgery right lumpectomy and SLN biopsy. IDA and DCIS, pT1cN0, negative margins    11/24/2014 Surgery right axillary abscess incision and drainage     HISTORY OF PRESENTING ILLNESS:  Destiny Moody 64 y.o. female presents to our breast [place] today for her newly diagnosed breast caner.   She had abnormal screening mammogram in September 2015, underwent biopsy on 06/15/2014 which was negative for malignancy. She developed I small hematoma after biopsy, which required drainage afterwards. She finally had a bilateral breast MRI , which showed a 1.7 cm non-mass enhancement in the medial right breast. She had repeated mammogram on 08/19/2014 which  showed again non-mass like enhancement in the right breast which was biopsied before. 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 She tolerates the biopsy well without any complications.  She feels well overall. She denies any new symptoms lately. She owns a grocery store, is physically very active. She has good appetite and energy level, no recent weight loss.  INTERIM HISTORY: Destiny Moody returns for follow-up. She underwent right axilla abscess I&D on 11/24/2014. She recovered very well from the surgery. She feels much better lately, no significant pain at the surgical site, she works full-time. She has good energy level and appetite. She has not started adjuvant radiation yet.  MEDICAL HISTORY:  Past Medical History  Diagnosis Date  . Hypothyroidism   . Rheumatoid arthritis(714.0)   . Osteopenia   . NIDDM (non-insulin dependent diabetes mellitus)   . Multinodular thyroid   . Hypercholesterolemia   . HSV infection   . Osteoporosis 2015  . Cancer   . Wears contact lenses   . Breast cancer 08/24/14    right, upper inner    SURGICAL HISTORY: Past Surgical History  Procedure Laterality Date  . Cholecystectomy  1993  . Tubal ligation  1996    re annastomosis  . Cesarean section  [id number]  . Colonoscopy    . Radioactive seed guided mastectomy with axillary sentinel lymph node biopsy Right 10/12/2014    Procedure: RIGHT  PARTIAL MASTECTOMY WITH RADIOACTIVE SEED LOCALIZATION  RIGHT  AXILLARY SENTINEL  NODE BIOPSY;  Surgeon: Fanny Skates, MD;  Location: [facility];  Service: General;  Laterality: Right;  . Incision and drainage abscess Right 11/24/2014    Procedure: INCISION AND DRAINAGE RIGHT AXILLA  ABSCESS;  Surgeon: Fanny Skates, MD;  Location: [facility];  Service: General;  Laterality: Right;    SOCIAL HISTORY: History   Social History  . Marital Status: Married    Spouse Name: Destiny Moody  . Number of  Children: 2  . Years of Education: College   Occupational History  .      Self Employed   Social History Main Topics  . Smoking status: Never Smoker   . Smokeless tobacco: Never Used  . Alcohol Use: Yes     Comment: occasionally  . Drug Use: No  . Sexual Activity: Not on file     Comment: menarche age 74, G45 , P 2, menopause age 51, no HRT   Other Topics Concern  . Not on file   Social History Narrative   Patient lives at home with her spouse.   Caffeine use: 1 cup daily   GYN HISTORY  Menarchal: 12 LMP: 42 Contraceptive: 1 yr HRT: no  G2P2:   FAMILY HISTORY: Family History  Problem Relation Age of Onset  . Diabetes Mother     niddm  . Heart disease Mother   . Heart failure Father   . Heart disease Father   . Ovarian cancer Cousin   . Hyperlipidemia Sister     ALLERGIES:  is allergic to shellfish allergy.  MEDICATIONS:  Current Outpatient Prescriptions  Medication Sig Dispense Refill  . amitriptyline (ELAVIL) 25 MG tablet Take 25 mg by mouth at bedtime.    . Calcium Carbonate-Vitamin D 600-200 MG-UNIT TABS Take 1,200 mg by mouth.    . fluticasone (FLONASE) 50 MCG/ACT nasal spray Place 2 sprays into both nostrils daily as needed for allergies or rhinitis.    Marland Kitchen glimepiride (AMARYL) 4 MG tablet Take 4 mg by mouth daily with breakfast.     . Glucosamine 500 MG CAPS Take 1 capsule by mouth daily.    Marland Kitchen lactobacillus acidophilus (BACID) TABS tablet Take 2 tablets by mouth 3 (three) times daily.    Marland Kitchen lisinopril (PRINIVIL,ZESTRIL) 2.5 MG tablet Take 2.5 mg by mouth daily.      . metFORMIN (GLUMETZA) 1000 MG (MOD) 24 hr tablet Take 1,000 mg by mouth 2 (two) times daily with a meal.     . Multiple Vitamin (MULTIVITAMIN) capsule Take 1 capsule by mouth daily.    . pioglitazone (ACTOS) 30 MG tablet Take 30 mg by mouth daily.    . simvastatin (ZOCOR) 20 MG tablet Take 20 mg by mouth at bedtime.      . TURMERIC PO Take 1 capsule by mouth daily.    Marland Kitchen acetaminophen  (TYLENOL) 500 MG tablet Take 1,000 mg by mouth every 6 (six) hours as needed.    Marland Kitchen alendronate (FOSAMAX) 10 MG tablet Take 10 mg by mouth. Take with a full glass of water on an empty stomach.    Marland Kitchen aspirin 81 MG tablet Take 81 mg by mouth daily.    . Exenatide ER 2 MG PEN Inject 2 mg into the skin once a week.    Marland Kitchen HYDROcodone-acetaminophen (NORCO) 5-325 MG per tablet Take 1-2 tablets by mouth every 6 (six) hours as needed for moderate pain or severe pain. (Patient not taking: Reported on 01/17/2015) 30 tablet 0   No current facility-administered medications for this visit.    REVIEW OF  SYSTEMS:   Constitutional: Denies fevers, chills or abnormal night sweats Eyes: Denies blurriness of vision, double vision or watery eyes Ears, nose, mouth, throat, and face: Denies mucositis or sore throat Respiratory: Denies cough, dyspnea or wheezes Cardiovascular: Denies palpitation, chest discomfort or lower extremity swelling Gastrointestinal:  Denies nausea, heartburn or change in bowel habits Skin: Denies abnormal skin rashes Lymphatics: Denies new lymphadenopathy or easy bruising Neurological:Denies numbness, tingling or new weaknesses Behavioral/Psych: Mood is stable, no new changes  All other systems were reviewed with the patient and are negative.  PHYSICAL EXAMINATION: ECOG PERFORMANCE STATUS: 0 - Asymptomatic  Filed Vitals:   01/17/15 1040  BP: 128/85  Temp: 98 F (36.7 C)  Resp: 20   Filed Weights   01/17/15 1040  Weight: 151 lb 6.4 oz (68.675 kg)    GENERAL:alert, no distress and comfortable SKIN: skin color, texture, turgor are normal, no rashes or significant lesions EYES: normal, conjunctiva are pink and non-injected, sclera clear OROPHARYNX:no exudate, no erythema and lips, buccal mucosa, and tongue normal  NECK: supple, thyroid normal size, non-tender, without nodularity LYMPH:  no palpable lymphadenopathy in the cervical, axillary or inguinal LUNGS: clear to auscultation  and percussion with normal breathing effort HEART: regular rate & rhythm and no murmurs and no lower extremity edema ABDOMEN:abdomen soft, non-tender and normal bowel sounds Musculoskeletal:no cyanosis of digits and no clubbing  PSYCH: alert & oriented x 3 with fluent speech NEURO: no focal motor/sensory deficits Breasts: Breast inspection showed them to be symmetrical with no nipple discharge. The incision sites in right breast and axilla are well healed, no surrounding skin erythema or palpable mass.  Palpation of the left breasts and axilla revealed no obvious mass that I could appreciate.   LABORATORY DATA:  I have reviewed the data as listed Lab Results  Component Value Date   WBC 6.9 01/17/2015   HGB 12.5 01/17/2015   HCT 37.1 01/17/2015   MCV 91.2 01/17/2015   PLT 246 01/17/2015    Recent Labs  09/01/14 1221 10/11/14 1600 11/24/14 0724 01/17/15 1017  NA 140 136 139 140  K 4.3 3.9 3.8 4.2  CL  --  100 107  --   CO2 _0 GLUCOSE 114 76 121* 136  BUN 15._1 14.4  CREATININE 0.8 0.62 0.70 0.7  CALCIUM 9.9 9.9 9.4 9.0  GFRNONAA  --  >90 >90  --   GFRAA  --  >90 >90  --   PROT 7.1 7.5  --  6.8  ALBUMIN 3.9 4.1  --  3.7  AST 21 26  --  19  ALT 26 26  --  25  ALKPHOS 73 80  --  84  BILITOT 0.35 0.4  --  0.32   PATHOLOGY REPORT  Diagnosis 10/12/2014 1. Breast, lumpectomy, right - INVASIVE DUCTAL CARCINOMA, SEE COMMENT. - NEGATIVE FOR LYMPH VASCULAR INVASION. - INVASIVE TUMOR IS 0.7 CM FROM NEAREST MARGIN (ANTERIOR). - DUCTAL CARCINOMA IN SITU - PREVIOUS BIOPSY SITE. - SEE TUMOR SYNOPTIC TEMPLATE BELOW. 2. Lymph node, sentinel, biopsy, right axillary - ONE LYMPH NODE, NEGATIVE FOR TUMOR (0/1). 1. BREAST, INVASIVE TUMOR, WITH LYMPH NODES PRESENT Specimen, including laterality and lymph node sampling (sentinel, non-sentinel): Right breast with sentinel lymph node sampling. Procedure: Lumpectomy. Histologic type: Ductal Grade: I of III Tubule  formation: 2 Nuclear pleomorphism: 2 Mitotic:1 Tumor size (gross measurement): 1.2 cm Margins: Invasive, distance to closest margin: 0.7 cm In-situ, distance to closest margin: 0.7 cm (anterior)  If margin positive, focally or broadly: N/A Lymphovascular invasion: Absent. Ductal carcinoma in situ: Present. Grade: I of III Extensive intraductal component: Absent. Lobular neoplasia: Absent. Tumor focality: Unifocal Treatment effect: None. If present, treatment effect in breast tissue, lymph nodes or both: N/A Extent of tumor: Skin: N/A Nipple: N/A Skeletal muscle: N/A Lymph nodes: Examined: 1 Sentinel 0 Non-sentinel 1 Total Lymph nodes with metastasis: 0 Isolated tumor cells (< 0.2 mm): N/A Micrometastasis: (> 0.2 mm and < 2.0 mm): N/A Macrometastasis: (> 2.0 mm): N/A Extracapsular extension: N/A Breast prognostic profile: Estrogen receptor: Not repeated previous study demonstrated 100% positivity ([id number]) Progesterone receptor: Not repeated previous study demonstrated 99% positivity ([id number]) Her 2 neu: Repeated, previous study demonstrated no amplification (2.00). Ki-67: Not repeated previous study demonstrated 12% proliferation rate Non-neoplastic breast: Previous biopsy site tissue changes. TNM: pT1c, pN0, pMX Results: HER-2/NEU BY CISH - NEGATIVE. RESULT RATIO OF HER2: CEP 17 SIGNALS 1.13 AVERAGE HER2 COPY NUMBER PER CELL 2.60   RADIOGRAPHIC STUDIES: I have personally reviewed the radiological images as listed and agreed with the findings in the report.  Mr Breast Bilateral W Wo Contrast 08/05/2014    IMPRESSION:  1. Post biopsy changes in the medial right breast at posterior depth. A 1.7 cm AP diameter area of linear clumped non mass enhancement is noted superior and lateral to the biopsy site which represents a good MR correlate to the previously noted mammographic finding.  2. No MR findings of malignancy in the left breast.  3. No adenopathy.     Mammogram and [place] 06/12/2014 IMPRESSION: 1. Persistent asymmetry with mild distortion in the medial aspect of the right breast for which biopsy is recommended. 2. No sonographic correlate is identified for this finding. Stereotactic guided core biopsy is recommended.  ASSESSMENT & PLAN:   64 year old postmenopausal woman with past medical history of diabetes, hypothyroidism, rheumatoid arthritis, who was recently found to have a early stage of right breast cancer.  1. 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 Grade 1, ER 100% positive, PR 99% positive, HER-2 negative, Ki-67 12%. Oncotype recurrence score 14 -She is status post lumpectomy and sentinel lymph node biopsy with negative surgical margins. -Her early stage breast cancer is likely cured by surgery alone, but does carry some risks of cancer recurrence after surgery. -I discussed the Oncotype DX test result with her in details. The recurrence score 14 predicts 10 year risk of distant recurrence 9% with tamoxifen alone. There is no benefit of adjuvant chemotherapy in patients with this low recurrence score. -I recommend adjuvant endocrine therapy with aromatase inhibitor, such as Aromasin, to reduce her risks of cancer recurrence after surgery. -I sent a message to radiation oncology Dr. Pablo Ledger tdoay, she is ready to start adjuvant breast irradiation. -I'll finalize her adjuvant endocrine therapy after she completes radiation.  2. Osteoporosis -Her bone density scan on 08/16/2014 showed T score -2.6 at left femoral neck, -2.1 at right femoral neck. -We discussed aromatase inhibitor may week her bone, and she would need Prolia to strength her bone   3. Dm, RA, hypothyroidism -She will continue to follow-up with her primary care physician  Plan -I'll see her back when she completes adjuvant irradiation. -I schedule her return up appointment in 2 months, she will call me if she completes radiation  earlier   All questions were answered. The patient knows to call the clinic with any problems, questions or concerns. I spent 20 minutes counseling the patient face to face.  The total time spent in the appointment was 30 minutes and more than 50% was on counseling.     Truitt Merle, MD 01/17/2015 11:30 AM

## 2015-01-17 NOTE — Patient Instructions (Addendum)
Restart aspirin 81mg  once per day, I will refer you to neurologist for headaches and gastroenterologist for colon cancer screening.   You should receive a call or letter about your lab results within the next week to 10 days.   Colace as stool softener, fiber in diet for constipation.  See information below, and follow up in next 2 weeks to discuss this and other concerns form today further.   I decreased you amaryl to 2mg  once per day. If dizziness returns - check your blood sugar right away, and if low eat something with sugar in it and return to discuss this with me further. See other information below.   I recommend meeting with a counselor to discuss your stressors.  The cancer center may have some options but other numbers if you need them: Vivia Budge: [place]: [place]: [phone]  If you would like to have a provider who is fluent in [place], [facility]. (see card)  Keeping You Healthy  Get These Tests  Blood Pressure- Have your blood pressure checked by your healthcare provider at least once a year.  Normal blood pressure is 120/80.  Weight- Have your body mass index (BMI) calculated to screen for obesity.  BMI is a measure of body fat based on height and weight.  You can calculate your own BMI at [employer]  Cholesterol- Have your cholesterol checked every year.  Diabetes- Have your blood sugar checked every year if you have high blood pressure, high cholesterol, a family history of diabetes or if you are overweight.  Pap Smear- Have a pap smear every 1 to 3 years if you have been sexually active.  If you are older than 65 and recent pap smears have been normal you may not need additional pap smears.  In addition, if you have had a hysterectomy  For benign disease additional pap smears are not necessary.  Mammogram-Yearly mammograms are essential for early detection of breast cancer  Screening for Colon Cancer-  Colonoscopy starting at age 63. Screening may begin sooner depending on your family history and other health conditions.  Follow up colonoscopy as directed by your Gastroenterologist.  Screening for Osteoporosis- Screening begins at age 33 with bone density scanning, sooner if you are at higher risk for developing Osteoporosis.  Get these medicines  Calcium with Vitamin D- Your body requires 1200-1500 mg of Calcium a day and [phone] IU of Vitamin D a day.  You can only absorb 500 mg of Calcium at a time therefore Calcium must be taken in 2 or 3 separate doses throughout the day.  Hormones- Hormone therapy has been associated with increased risk for certain cancers and heart disease.  Talk to your healthcare provider about if you need relief from menopausal symptoms.  Aspirin- Ask your healthcare provider about taking Aspirin to prevent Heart Disease and Stroke.  Get these Immuniztions  Flu shot- Every fall  Pneumonia shot- Once after the age of 70; if you are younger ask your healthcare provider if you need a pneumonia shot.  Tetanus- Every ten years.  Zostavax- Once after the age of 107 to prevent shingles.  Take these steps  Don't smoke- Your healthcare provider can help you quit. For tips on how to quit, ask your healthcare provider or go to www.smokefree.gov or call 1-800 QUIT-NOW.  Be physically active- Exercise 5 days a week for a minimum of 30 minutes.  If you are not already physically active, start slow and  gradually work up to 30 minutes of moderate physical activity.  Try walking, dancing, bike riding, swimming, etc.  Eat a healthy diet- Eat a variety of healthy foods such as fruits, vegetables, whole grains, low fat milk, low fat cheeses, yogurt, lean meats, chicken, fish, eggs, dried beans, tofu, etc.  For more information go to www.thenutritionsource.org  Dental visit- Brush and floss teeth twice daily; visit your dentist twice a year.  Eye exam- Visit your Optometrist  or Ophthalmologist yearly.  Drink alcohol in moderation- Limit alcohol intake to one drink or less a day.  Never drink and drive.  Depression- Your emotional health is as important as your physical health.  If you're feeling down or losing interest in things you normally enjoy, please talk to your healthcare provider.  Seat Belts- can save your life; always wear one  Smoke/Carbon Monoxide detectors- These detectors need to be installed on the appropriate level of your home.  Replace batteries at least once a year.  Violence- If anyone is threatening or hurting you, please tell your healthcare provider.  Living Will/ Health care power of attorney- Discuss with your healthcare provider and family.  Constipation Constipation is when a person has fewer than three bowel movements a week, has difficulty having a bowel movement, or has stools that are dry, hard, or larger than normal. As people grow older, constipation is more common. If you try to fix constipation with medicines that make you have a bowel movement (laxatives), the problem may get worse. Long-term laxative use may cause the muscles of the colon to become weak. A low-fiber diet, not taking in enough fluids, and taking certain medicines may make constipation worse.  CAUSES   Certain medicines, such as antidepressants, pain medicine, iron supplements, antacids, and water pills.   Certain diseases, such as diabetes, irritable bowel syndrome (IBS), thyroid disease, or depression.   Not drinking enough water.   Not eating enough fiber-rich foods.   Stress or travel.   Lack of physical activity or exercise.   Ignoring the urge to have a bowel movement.   Using laxatives too much.  SIGNS AND SYMPTOMS   Having fewer than three bowel movements a week.   Straining to have a bowel movement.   Having stools that are hard, dry, or larger than normal.   Feeling full or bloated.   Pain in the lower abdomen.   Not  feeling relief after having a bowel movement.  DIAGNOSIS  Your health care provider will take a medical history and perform a physical exam. Further testing may be done for severe constipation. Some tests may include:  A barium enema X-ray to examine your rectum, colon, and, sometimes, your small intestine.   A sigmoidoscopy to examine your lower colon.   A colonoscopy to examine your entire colon. TREATMENT  Treatment will depend on the severity of your constipation and what is causing it. Some dietary treatments include drinking more fluids and eating more fiber-rich foods. Lifestyle treatments may include regular exercise. If these diet and lifestyle recommendations do not help, your health care provider may recommend taking over-the-counter laxative medicines to help you have bowel movements. Prescription medicines may be prescribed if over-the-counter medicines do not work.  HOME CARE INSTRUCTIONS   Eat foods that have a lot of fiber, such as fruits, vegetables, whole grains, and beans.  Limit foods high in fat and processed sugars, such as french fries, hamburgers, cookies, candies, and soda.   A fiber supplement may be added  to your diet if you cannot get enough fiber from foods.   Drink enough fluids to keep your urine clear or pale yellow.   Exercise regularly or as directed by your health care provider.   Go to the restroom when you have the urge to go. Do not hold it.   Only take over-the-counter or prescription medicines as directed by your health care provider. Do not take other medicines for constipation without talking to your health care provider first.  [place] IF:   You have bright red blood in your stool.   Your constipation lasts for more than 4 days or gets worse.   You have abdominal or rectal pain.   You have thin, pencil-like stools.   You have unexplained weight loss. MAKE SURE YOU:   Understand these  instructions.  Will watch your condition.  Will get help right away if you are not doing well or get worse. Document Released: 06/01/2004 Document Revised: 09/08/2013 Document Reviewed: 06/15/2013 [facility] Patient Information 2015 [place], [place]. This information is not intended to replace advice given to you by your health care provider. Make sure you discuss any questions you have with your health care provider.  Hypoglycemia Hypoglycemia occurs when the glucose in your blood is too low. Glucose is a type of sugar that is your body's main energy source. Hormones, such as insulin and glucagon, control the level of glucose in the blood. Insulin lowers blood glucose and glucagon increases blood glucose. Having too much insulin in your blood stream, or not eating enough food containing sugar, can result in hypoglycemia. Hypoglycemia can happen to people with or without diabetes. It can develop quickly and can be a medical emergency.  CAUSES   Missing or delaying meals.  Not eating enough carbohydrates at meals.  Taking too much diabetes medicine.  Not timing your oral diabetes medicine or insulin doses with meals, snacks, and exercise.  Nausea and vomiting.  Certain medicines.  Severe illnesses, such as hepatitis, kidney disorders, and certain eating disorders.  Increased activity or exercise without eating something extra or adjusting medicines.  Drinking too much alcohol.  A nerve disorder that affects body functions like your heart rate, blood pressure, and digestion (autonomic neuropathy).  A condition where the stomach muscles do not function properly (gastroparesis). Therefore, medicines and food may not absorb properly.  Rarely, a tumor of the pancreas can produce too much insulin. SYMPTOMS   Hunger.  Sweating (diaphoresis).  Change in body temperature.  Shakiness.  Headache.  Anxiety.  Lightheadedness.  Irritability.  Difficulty concentrating.  Dry  mouth.  Tingling or numbness in the hands or feet.  Restless sleep or sleep disturbances.  Altered speech and coordination.  Change in mental status.  Seizures or prolonged convulsions.  Combativeness.  Drowsiness (lethargic).  Weakness.  Increased heart rate or palpitations.  Confusion.  Pale, gray skin color.  Blurred or double vision.  Fainting. DIAGNOSIS  A physical exam and medical history will be performed. Your caregiver may make a diagnosis based on your symptoms. Blood tests and other lab tests may be performed to confirm a diagnosis. Once the diagnosis is made, your caregiver will see if your signs and symptoms go away once your blood glucose is raised.  TREATMENT  Usually, you can easily treat your hypoglycemia when you notice symptoms.  Check your blood glucose. If it is less than 70 mg/dl, take one of the following:   3-4 glucose tablets.    cup juice.  cup regular soda.   1 cup skim milk.   -1 tube of glucose gel.   5-6 hard candies.   Avoid high-fat drinks or food that may delay a rise in blood glucose levels.  Do not take more than the recommended amount of sugary foods, drinks, gel, or tablets. Doing so will cause your blood glucose to go too high.   Wait 10-15 minutes and recheck your blood glucose. If it is still less than 70 mg/dl or below your target range, repeat treatment.   Eat a snack if it is more than 1 hour until your next meal.  There may be a time when your blood glucose may go so low that you are unable to treat yourself at home when you start to notice symptoms. You may need someone to help you. You may even faint or be unable to swallow. If you cannot treat yourself, someone will need to bring you to the hospital.  [place]  If you have diabetes, follow your diabetes management plan by:  Taking your medicines as directed.  Following your exercise plan.  Following your meal plan. Do not skip  meals. Eat on time.  Testing your blood glucose regularly. Check your blood glucose before and after exercise. If you exercise longer or different than usual, be sure to check blood glucose more frequently.  Wearing your medical alert jewelry that says you have diabetes.  Identify the cause of your hypoglycemia. Then, develop ways to prevent the recurrence of hypoglycemia.  Do not take a hot bath or shower right after an insulin shot.  Always carry treatment with you. Glucose tablets are the easiest to carry.  If you are going to drink alcohol, drink it only with meals.  Tell friends or family members ways to keep you safe during a seizure. This may include removing hard or sharp objects from the area or turning you on your side.  Maintain a healthy weight. SEEK MEDICAL CARE IF:   You are having problems keeping your blood glucose in your target range.  You are having frequent episodes of hypoglycemia.  You feel you might be having side effects from your medicines.  You are not sure why your blood glucose is dropping so low.  You notice a change in vision or a new problem with your vision. SEEK IMMEDIATE MEDICAL CARE IF:   Confusion develops.  A change in mental status occurs.  The inability to swallow develops.  Fainting occurs. Document Released: 09/03/2005 Document Revised: 09/08/2013 Document Reviewed: 12/31/2011 [facility] Patient Information 2015 [place], [place]. This information is not intended to replace advice given to you by your health care provider. Make sure you discuss any questions you have with your health care provider.

## 2015-01-17 NOTE — Progress Notes (Addendum)
Subjective:    Patient ID: Destiny Moody, female    DOB: [birth year], 64 y.o.   MRN: [id number] This chart was scribed for Destiny Ray, MD by Zola Button, Medical Scribe. This patient was seen in Room 24 and the patient's care was started at 2:32 PM.    HPI HPI Comments: Destiny Moody is a 64 y.o. female with a hx of breast cancer and DM who presents to the [facility] for a complete physical exam.  Cancer Screening: She had a colonoscopy in 2004, which was normal. She was told to follow-up in 10 years. She does not remember who she had her last colonoscopy with, but it may have been Dr. Benson Norway. Pap testing August, 2015 by Dr. Toney Rakes negative. Patient still sees Dr. Toney Rakes, her gynecologist.  Breast Cancer: She is followed by Dr. 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 Initial abnormal mammogram in September, 2015. 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 Office visit with Dr. Burr Medico was today. She is planning on breast radiation and endocrine therapy with aromatase inhibitor, such as Aromasin. She did have a right axilla abscess drained by Dr. Dalbert Batman on March 9th. She was told to stop aspirin after one of her prior surgeries.  Osteoporosis: Bone density November, 2015 with t-score -2.6. Reviewed note from Dr. Burr Medico that aromatase inhibitor may weaken bone and may need Prolia. She is followed by Dr. Toney Rakes for this in the past. Started on Fosamax 70 mg q week on 08/10/14. She was told to stop Fosamax temporarily by her oncologist; it has been put on hold.   Immunizations: Tetanus status: last shot about 8 years ago per patient. Flu status: she did get a flu vaccine and pneumonia vaccine last year. Shingles status: she has never had the shingles vaccine.   Depression Screening: Patient notes having some stress. She has never been on depression medications. She denies  SI/HI. Depression screen PHQ 2/9 01/17/2015  Decreased Interest 0  Down, Depressed, Hopeless 0  PHQ - 2 Score 0   Diabetes: She takes metformin 1000 mg BID and Amaryl 4 mg QD as well as 2 mg injection of Exenatide once a week. She is on Elavil for headaches. No tingling in her feet per patient. She was followed by Dr. Quentin Ore with [facility] in [place] in the past, but she had to change primary care for insurance reasons. Patient has had prior TIA (possibly, she does not remember what exactly she was diagnosed with). She states her last HbA1c was about 6.2 in December. Patient notes she measured her CBG yesterday, which was 127. No history of HTN per patient. She notes some increased urinary and increased thirst due to diabetes, with no changes of symptoms. Patient also has had some occasional dizziness, twice in the past month. She did measure her blood sugar during those episodes, which measured un the 60s or 70s.  Exercise: She notes some occasional walking for exercise.  Dentist: She saw a dentist within the past 3 weeks.  Eye Care Provider: Patient saw an eye doctor last week, no retinopathy or other eye problems seen.  Allergic Rhinitis: She takes Flonase nasal spray as needed.  Hyperlipidemia: She takes Zocort 20 mg qd. No recent lipid panel in Epic.  Constipation: She is not taking any constipation medications. She has been having constipation since a prior surgery.  She has a normal CMP and CBC today  at previous appointment. Normal vitamin D at 35 in November, 2015.  Patient is originally from [place].  Patient Active Problem List   Diagnosis Date Noted  . Abscess of axilla, right 11/24/2014  . Breast cancer of upper-inner quadrant of right female breast 08/26/2014  . Osteoporosis 08/10/2014  . Vaginal atrophy 04/27/2014  . RLQ PAIN 04/08/2008  . GERD 04/07/2008  . FATTY LIVER DISEASE 04/07/2008   Past Medical History  Diagnosis Date  . Hypothyroidism   . Rheumatoid  arthritis(714.0)   . Osteopenia   . NIDDM (non-insulin dependent diabetes mellitus)   . Multinodular thyroid   . Hypercholesterolemia   . HSV infection   . Osteoporosis 2015  . Cancer   . Wears contact lenses   . Breast cancer 08/24/14    right, upper inner   Past Surgical History  Procedure Laterality Date  . Cholecystectomy  1993  . Tubal ligation  1996    re annastomosis  . Cesarean section  [id number]  . Colonoscopy    . Radioactive seed guided mastectomy with axillary sentinel lymph node biopsy Right 10/12/2014    Procedure: RIGHT  PARTIAL MASTECTOMY WITH RADIOACTIVE SEED LOCALIZATION  RIGHT  AXILLARY SENTINEL  NODE BIOPSY;  Surgeon: Fanny Skates, MD;  Location: [facility];  Service: General;  Laterality: Right;  . Incision and drainage abscess Right 11/24/2014    Procedure: INCISION AND DRAINAGE RIGHT AXILLA  ABSCESS;  Surgeon: Fanny Skates, MD;  Location: [facility];  Service: General;  Laterality: Right;   Allergies  Allergen Reactions  . Shellfish Allergy Hives   Prior to Admission medications   Medication Sig Start Date End Date Taking? Authorizing Provider  alendronate (FOSAMAX) 10 MG tablet Take 10 mg by mouth. Take with a full glass of water on an empty stomach.   Yes Historical Provider, MD  amitriptyline (ELAVIL) 25 MG tablet Take 25 mg by mouth at bedtime.   Yes Historical Provider, MD  Calcium Carbonate-Vitamin D 600-200 MG-UNIT TABS Take 1,200 mg by mouth.   Yes Historical Provider, MD  Exenatide ER 2 MG PEN Inject 2 mg into the skin once a week.   Yes Historical Provider, MD  fluticasone (FLONASE) 50 MCG/ACT nasal spray Place 2 sprays into both nostrils daily as needed for allergies or rhinitis.   Yes Historical Provider, MD  glimepiride (AMARYL) 4 MG tablet Take 4 mg by mouth daily with breakfast.    Yes Historical Provider, MD  Glucosamine 500 MG CAPS Take 1 capsule by mouth daily.   Yes Historical Provider, MD  lisinopril (PRINIVIL,ZESTRIL) 2.5 MG  tablet Take 2.5 mg by mouth daily.     Yes Historical Provider, MD  metFORMIN (GLUMETZA) 1000 MG (MOD) 24 hr tablet Take 1,000 mg by mouth 2 (two) times daily with a meal.    Yes Historical Provider, MD  Multiple Vitamin (MULTIVITAMIN) capsule Take 1 capsule by mouth daily.   Yes Historical Provider, MD  pioglitazone (ACTOS) 30 MG tablet Take 30 mg by mouth daily.   Yes Historical Provider, MD  simvastatin (ZOCOR) 20 MG tablet Take 20 mg by mouth at bedtime.     Yes Historical Provider, MD  TURMERIC PO Take 1 capsule by mouth daily.   Yes Historical Provider, MD  acetaminophen (TYLENOL) 500 MG tablet Take 1,000 mg by mouth every 6 (six) hours as needed.    Historical Provider, MD  aspirin 81 MG tablet Take 81 mg by mouth daily.    Historical Provider, MD  HYDROcodone-acetaminophen (  NORCO) 5-325 MG per tablet Take 1-2 tablets by mouth every 6 (six) hours as needed for moderate pain or severe pain. Patient not taking: Reported on 01/17/2015 11/24/14   Fanny Skates, MD  lactobacillus acidophilus (BACID) TABS tablet Take 2 tablets by mouth 3 (three) times daily.    Historical Provider, MD   History   Social History  . Marital Status: Married    Spouse Name: [name]  . Number of Children: 2  . Years of Education: College   Occupational History  .      Self Employed   Social History Main Topics  . Smoking status: Never Smoker   . Smokeless tobacco: Never Used  . Alcohol Use: Yes     Comment: occasionally  . Drug Use: No  . Sexual Activity: Not on file     Comment: menarche age 86, G95 , P 2, menopause age 76, no HRT   Other Topics Concern  . Not on file   Social History Narrative   Patient lives at home with her spouse.   Caffeine use: 1 cup daily     Review of Systems 13 point ROS reviewed. See reviewed nursing note or above. Negative other than listed above.      Objective:   Physical Exam  Constitutional: She is oriented to person, place, and time. She appears well-developed  and well-nourished. No distress.  HENT:  Head: Normocephalic and atraumatic.  Mouth/Throat: Oropharynx is clear and moist. No oropharyngeal exudate.  Eyes: Pupils are equal, round, and reactive to light.  Neck: Neck supple.  Cardiovascular: Normal rate.   Pulmonary/Chest: Effort normal.  Musculoskeletal: She exhibits no edema.  Neurological: She is alert and oriented to person, place, and time. No cranial nerve deficit.  Skin: Skin is warm and dry. No rash noted.  Psychiatric: She has a normal mood and affect. Her behavior is normal.  Nursing note and vitals reviewed.   Filed Vitals:   01/17/15 1333  BP: 108/66  Pulse: 101  Temp: 98.5 F (36.9 C)  TempSrc: Oral  Resp: 16  Height: 5' 2.75" (1.594 m)  Weight: 151 lb (68.493 kg)  SpO2: 96%     Results for orders placed or performed in visit on 01/17/15  POCT glycosylated hemoglobin (Hb A1C)  Result Value Ref Range   Hemoglobin A1C 5.9   POCT glucose (manual entry)  Result Value Ref Range   POC Glucose 215 (A) 70 - 99 mg/dl       Assessment & Plan:   Kaci Dillie is a 64 y.o. female Annual physical exam  --anticipatory guidance as below in AVS, screening labs above. Health maintenance items as above in HPI discussed/recommended as applicable.   Invasive ductal carcinoma of breast, female, right  -continue follow up with cancer center  Screen for colon cancer - Plan: Ambulatory referral to Gastroenterology for colonoscopy   Type 2 diabetes mellitus without complication, Hypoglycemia - Plan: POCT glycosylated hemoglobin (Hb A1C), POCT glucose (manual entry) - Plan: HM Diabetes Foot Exam, metFORMIN (GLUMETZA) 1000 MG (MOD) 24 hr tablet, Exenatide ER 2 MG PEN, POCT glycosylated hemoglobin (Hb A1C), POCT glucose (manual entry), glimepiride (AMARYL) 2 MG tablet, DISCONTINUED: glimepiride (AMARYL) 4 MG tablet  -possibly overtreated, will decrease amaryl dose. Hypoglycemic precautions.   -restart asa 81mg  qd.   Essential  hypertension - Plan: lisinopril (PRINIVIL,ZESTRIL) 2.5 MG tablet  -stable. No med changes  Hyperlipemia - Plan: simvastatin (ZOCOR) 20 MG tablet, Lipid panel  -Lipid panel, refilled zocor 20mg  qd.  Nonintractable episodic headache, unspecified headache type - Plan: amitriptyline (ELAVIL) 25 MG tablet, Ambulatory referral to Neurology  -refer to neuro for further eval, but stress/tension possible.  counseling recommended. Numbers provided, but may be able to seek resources through cancer center.  Allergic rhinitis, unspecified allergic rhinitis type - Plan: fluticasone (FLONASE) 50 MCG/ACT nasal spray  Need for shingles vaccine - Plan: zoster vaccine live, PF, (ZOSTAVAX) 95621 UNT/0.65ML injection - Rx given.     Meds ordered this encounter  Medications  . metFORMIN (GLUMETZA) 1000 MG (MOD) 24 hr tablet    Sig: Take 1 tablet (1,000 mg total) by mouth 2 (two) times daily with a meal.    Dispense:  180 tablet    Refill:  1  . simvastatin (ZOCOR) 20 MG tablet    Sig: Take 1 tablet (20 mg total) by mouth at bedtime.    Dispense:  90 tablet    Refill:  1  . fluticasone (FLONASE) 50 MCG/ACT nasal spray    Sig: Place 2 sprays into both nostrils daily as needed for allergies or rhinitis.    Dispense:  16 g    Refill:  6  . DISCONTD: glimepiride (AMARYL) 4 MG tablet    Sig: Take 1 tablet (4 mg total) by mouth daily with breakfast.    Dispense:  90 tablet    Refill:  1  . lisinopril (PRINIVIL,ZESTRIL) 2.5 MG tablet    Sig: Take 1 tablet (2.5 mg total) by mouth daily.    Dispense:  90 tablet    Refill:  1  . amitriptyline (ELAVIL) 25 MG tablet    Sig: Take 1 tablet (25 mg total) by mouth at bedtime.    Dispense:  30 tablet    Refill:  1  . Exenatide ER 2 MG PEN    Sig: Inject 2 mg into the skin once a week.    Dispense:  1 each    Refill:  12  . zoster vaccine live, PF, (ZOSTAVAX) 30865 UNT/0.65ML injection    Sig: Inject 19,400 Units into the skin once.    Dispense:  1 each     Refill:  0  . glimepiride (AMARYL) 2 MG tablet    Sig: Take 1 tablet (2 mg total) by mouth daily before breakfast.    Dispense:  90 tablet    Refill:  1   Patient Instructions  Restart aspirin 81mg  once per day, I will refer you to neurologist for headaches and gastroenterologist for colon cancer screening.   You should receive a call or letter about your lab results within the next week to 10 days.   Colace as stool softener, fiber in diet for constipation.  See information below, and follow up in next 2 weeks to discuss this and other concerns form today further.   I decreased you amaryl to 2mg  once per day. If dizziness returns - check your blood sugar right away, and if low eat something with sugar in it and return to discuss this with me further. See other information below.   I recommend meeting with a counselor to discuss your stressors.  The cancer center may have some options but other numbers if you need them: Vivia Budge: [place]: [place]: [phone]  If you would like to have a provider who is fluent in [place], [facility]. (see card)  Keeping You Healthy  Get These Tests  Blood Pressure- Have your blood pressure checked by your healthcare  provider at least once a year.  Normal blood pressure is 120/80.  Weight- Have your body mass index (BMI) calculated to screen for obesity.  BMI is a measure of body fat based on height and weight.  You can calculate your own BMI at [employer].it  Cholesterol- Have your cholesterol checked every year.  Diabetes- Have your blood sugar checked every year if you have high blood pressure, high cholesterol, a family history of diabetes or if you are overweight.  Pap Smear- Have a pap smear every 1 to 3 years if you have been sexually active.  If you are older than 65 and recent pap smears have been normal you may not need additional pap smears.  In addition, if you have had a hysterectomy   For benign disease additional pap smears are not necessary.  Mammogram-Yearly mammograms are essential for early detection of breast cancer  Screening for Colon Cancer- Colonoscopy starting at age 46. Screening may begin sooner depending on your family history and other health conditions.  Follow up colonoscopy as directed by your Gastroenterologist.  Screening for Osteoporosis- Screening begins at age 26 with bone density scanning, sooner if you are at higher risk for developing Osteoporosis.  Get these medicines  Calcium with Vitamin D- Your body requires 1200-1500 mg of Calcium a day and [phone] IU of Vitamin D a day.  You can only absorb 500 mg of Calcium at a time therefore Calcium must be taken in 2 or 3 separate doses throughout the day.  Hormones- Hormone therapy has been associated with increased risk for certain cancers and heart disease.  Talk to your healthcare provider about if you need relief from menopausal symptoms.  Aspirin- Ask your healthcare provider about taking Aspirin to prevent Heart Disease and Stroke.  Get these Immuniztions  Flu shot- Every fall  Pneumonia shot- Once after the age of 91; if you are younger ask your healthcare provider if you need a pneumonia shot.  Tetanus- Every ten years.  Zostavax- Once after the age of 65 to prevent shingles.  Take these steps  Don't smoke- Your healthcare provider can help you quit. For tips on how to quit, ask your healthcare provider or go to www.smokefree.gov or call 1-800 QUIT-NOW.  Be physically active- Exercise 5 days a week for a minimum of 30 minutes.  If you are not already physically active, start slow and gradually work up to 30 minutes of moderate physical activity.  Try walking, dancing, bike riding, swimming, etc.  Eat a healthy diet- Eat a variety of healthy foods such as fruits, vegetables, whole grains, low fat milk, low fat cheeses, yogurt, lean meats, chicken, fish, eggs, dried beans, tofu, etc.  For  more information go to www.thenutritionsource.org  Dental visit- Brush and floss teeth twice daily; visit your dentist twice a year.  Eye exam- Visit your Optometrist or Ophthalmologist yearly.  Drink alcohol in moderation- Limit alcohol intake to one drink or less a day.  Never drink and drive.  Depression- Your emotional health is as important as your physical health.  If you're feeling down or losing interest in things you normally enjoy, please talk to your healthcare provider.  Seat Belts- can save your life; always wear one  Smoke/Carbon Monoxide detectors- These detectors need to be installed on the appropriate level of your home.  Replace batteries at least once a year.  Violence- If anyone is threatening or hurting you, please tell your healthcare provider.  Living Will/ Health care power of  attorney- Discuss with your healthcare provider and family.  Constipation Constipation is when a person has fewer than three bowel movements a week, has difficulty having a bowel movement, or has stools that are dry, hard, or larger than normal. As people grow older, constipation is more common. If you try to fix constipation with medicines that make you have a bowel movement (laxatives), the problem may get worse. Long-term laxative use may cause the muscles of the colon to become weak. A low-fiber diet, not taking in enough fluids, and taking certain medicines may make constipation worse.  CAUSES   Certain medicines, such as antidepressants, pain medicine, iron supplements, antacids, and water pills.   Certain diseases, such as diabetes, irritable bowel syndrome (IBS), thyroid disease, or depression.   Not drinking enough water.   Not eating enough fiber-rich foods.   Stress or travel.   Lack of physical activity or exercise.   Ignoring the urge to have a bowel movement.   Using laxatives too much.  SIGNS AND SYMPTOMS   Having fewer than three bowel movements a week.    Straining to have a bowel movement.   Having stools that are hard, dry, or larger than normal.   Feeling full or bloated.   Pain in the lower abdomen.   Not feeling relief after having a bowel movement.  DIAGNOSIS  Your health care provider will take a medical history and perform a physical exam. Further testing may be done for severe constipation. Some tests may include:  A barium enema X[place] to examine your rectum, colon, and, sometimes, your small intestine.   A sigmoidoscopy to examine your lower colon.   A colonoscopy to examine your entire colon. TREATMENT  Treatment will depend on the severity of your constipation and what is causing it. Some dietary treatments include drinking more fluids and eating more fiber-rich foods. Lifestyle treatments may include regular exercise. If these diet and lifestyle recommendations do not help, your health care provider may recommend taking over-the-counter laxative medicines to help you have bowel movements. Prescription medicines may be prescribed if over-the-counter medicines do not work.  HOME CARE INSTRUCTIONS   Eat foods that have a lot of fiber, such as fruits, vegetables, whole grains, and beans.  Limit foods high in fat and processed sugars, such as french fries, hamburgers, cookies, candies, and soda.   A fiber supplement may be added to your diet if you cannot get enough fiber from foods.   Drink enough fluids to keep your urine clear or pale yellow.   Exercise regularly or as directed by your health care provider.   Go to the restroom when you have the urge to go. Do not hold it.   Only take over-the-counter or prescription medicines as directed by your health care provider. Do not take other medicines for constipation without talking to your health care provider first.  [place] IF:   You have bright red blood in your stool.   Your constipation lasts for more than 4 days or gets worse.    You have abdominal or rectal pain.   You have thin, pencil-like stools.   You have unexplained weight loss. MAKE SURE YOU:   Understand these instructions.  Will watch your condition.  Will get help right away if you are not doing well or get worse. Document Released: 06/01/2004 Document Revised: 09/08/2013 Document Reviewed: 06/15/2013 [facility] Patient Information 2015 [place], [place]. This information is not intended to replace advice given to you by  your health care provider. Make sure you discuss any questions you have with your health care provider.  Hypoglycemia Hypoglycemia occurs when the glucose in your blood is too low. Glucose is a type of sugar that is your body's main energy source. Hormones, such as insulin and glucagon, control the level of glucose in the blood. Insulin lowers blood glucose and glucagon increases blood glucose. Having too much insulin in your blood stream, or not eating enough food containing sugar, can result in hypoglycemia. Hypoglycemia can happen to people with or without diabetes. It can develop quickly and can be a medical emergency.  CAUSES   Missing or delaying meals.  Not eating enough carbohydrates at meals.  Taking too much diabetes medicine.  Not timing your oral diabetes medicine or insulin doses with meals, snacks, and exercise.  Nausea and vomiting.  Certain medicines.  Severe illnesses, such as hepatitis, kidney disorders, and certain eating disorders.  Increased activity or exercise without eating something extra or adjusting medicines.  Drinking too much alcohol.  A nerve disorder that affects body functions like your heart rate, blood pressure, and digestion (autonomic neuropathy).  A condition where the stomach muscles do not function properly (gastroparesis). Therefore, medicines and food may not absorb properly.  Rarely, a tumor of the pancreas can produce too much insulin. SYMPTOMS   Hunger.  Sweating  (diaphoresis).  Change in body temperature.  Shakiness.  Headache.  Anxiety.  Lightheadedness.  Irritability.  Difficulty concentrating.  Dry mouth.  Tingling or numbness in the hands or feet.  Restless sleep or sleep disturbances.  Altered speech and coordination.  Change in mental status.  Seizures or prolonged convulsions.  Combativeness.  Drowsiness (lethargic).  Weakness.  Increased heart rate or palpitations.  Confusion.  Pale, gray skin color.  Blurred or double vision.  Fainting. DIAGNOSIS  A physical exam and medical history will be performed. Your caregiver may make a diagnosis based on your symptoms. Blood tests and other lab tests may be performed to confirm a diagnosis. Once the diagnosis is made, your caregiver will see if your signs and symptoms go away once your blood glucose is raised.  TREATMENT  Usually, you can easily treat your hypoglycemia when you notice symptoms.  Check your blood glucose. If it is less than 70 mg/dl, take one of the following:   3-4 glucose tablets.    cup juice.    cup regular soda.   1 cup skim milk.   -1 tube of glucose gel.   5-6 hard candies.   Avoid high-fat drinks or food that may delay a rise in blood glucose levels.  Do not take more than the recommended amount of sugary foods, drinks, gel, or tablets. Doing so will cause your blood glucose to go too high.   Wait 10-15 minutes and recheck your blood glucose. If it is still less than 70 mg/dl or below your target range, repeat treatment.   Eat a snack if it is more than 1 hour until your next meal.  There may be a time when your blood glucose may go so low that you are unable to treat yourself at home when you start to notice symptoms. You may need someone to help you. You may even faint or be unable to swallow. If you cannot treat yourself, someone will need to bring you to the hospital.  [place]  If you have  diabetes, follow your diabetes management plan by:  Taking your medicines as directed.  Following  your exercise plan.  Following your meal plan. Do not skip meals. Eat on time.  Testing your blood glucose regularly. Check your blood glucose before and after exercise. If you exercise longer or different than usual, be sure to check blood glucose more frequently.  Wearing your medical alert jewelry that says you have diabetes.  Identify the cause of your hypoglycemia. Then, develop ways to prevent the recurrence of hypoglycemia.  Do not take a hot bath or shower right after an insulin shot.  Always carry treatment with you. Glucose tablets are the easiest to carry.  If you are going to drink alcohol, drink it only with meals.  Tell friends or family members ways to keep you safe during a seizure. This may include removing hard or sharp objects from the area or turning you on your side.  Maintain a healthy weight. SEEK MEDICAL CARE IF:   You are having problems keeping your blood glucose in your target range.  You are having frequent episodes of hypoglycemia.  You feel you might be having side effects from your medicines.  You are not sure why your blood glucose is dropping so low.  You notice a change in vision or a new problem with your vision. SEEK IMMEDIATE MEDICAL CARE IF:   Confusion develops.  A change in mental status occurs.  The inability to swallow develops.  Fainting occurs. Document Released: 09/03/2005 Document Revised: 09/08/2013 Document Reviewed: 12/31/2011 [facility] Patient Information 2015 [place], [place]. This information is not intended to replace advice given to you by your health care provider. Make sure you discuss any questions you have with your health care provider.       I personally performed the services described in this documentation, which was scribed in my presence. The recorded information has been reviewed and considered, and addended  by me as needed.

## 2015-01-19 NOTE — Progress Notes (Signed)
   Department of Radiation Oncology  Phone:  [phone] Fax:        [phone]   Name: Destiny Moody MRN: [id number]  DOB: October 31, 1950  Date: 01/20/2015  Follow Up Visit Note  Diagnosis: Breast cancer of upper-inner quadrant of right female breast   Staging form: Breast, AJCC 7th Edition     Clinical stage from 09/01/2014: Stage IA (T1c, N0, M0) - Unsigned     Pathologic stage from 10/14/2014: Stage IA (T1c, N0, cM0) - Signed by Seward Grater, MD on 10/19/2014       Staging comments: Staged on final lumpectomy specimen by Dr. Donato Heinz  Interval History: Destiny Moody presents today for routine followup.  She had her lumpectomy on 1/26 which showed a 1.2 cm tumor with negative margins. Her tumor was ER/PR+ HER2-. One sentinel lymph node was negative. She unfortunately had to undergo incision and drainage of right axillary by Dr. Dalbert Batman on 11/24/2014. She has healed well from that surgery. She discussed the results of Oncotype testing with Dr. Burr Medico and does not need any chemotherapy. She is ready to begin radiation.  She is accompanied by her husband.  Physical Exam:  There were no vitals filed for this visit. Pleasant female. Seroma has resolved  under the axillary incision. Breast incision is healing well. Alert and oriented  IMPRESSION: Destiny Moody is a 64 y.o. female s/p lumpectomy now ready for radiation.   PLAN:  I spoke to the patient today regarding her diagnosis and options for treatment. We discussed the equivalence in terms of survival and local failure between mastectomy and breast conservation. We discussed the role of radiation in decreasing local failures in patients who undergo lumpectomy. We discussed the process of simulation and the placement tattoos. We discussed 4-6 weeks of treatment as an outpatient. We discussed the possibility of asymptomatic lung damage. We discussed the low likelihood of secondary malignancies. We discussed the possible side effects including but not limited to skin  redness, fatigue, permanent skin darkening, and breast swelling. We discussed the process of simulation and the placement of tattoos.  She signed informed consent and was able to be simulated today.  This document serves as a record of services personally performed by Thea Silversmith, MD. It was created on her behalf by Arlyce Harman, a trained medical scribe. The creation of this record is based on the scribe's personal observations and the provider's statements to them. This document has been checked and approved by the attending provider.      Thea Silversmith, MD

## 2015-01-19 NOTE — Progress Notes (Signed)
Name: Destiny Moody   MRN: [id number]  Date:  01/20/2015  DOB: 05-13-1951  Status:outpatient    DIAGNOSIS: Breast cancer.  CONSENT VERIFIED: yes   SET UP: Patient is setup supine   IMMOBILIZATION:  The following immobilization was used:Custom Moldable Pillow, breast board.   NARRATIVE: Ms. Coltrane was brought to the [facility].  Identity was confirmed.  All relevant records and images related to the planned course of therapy were reviewed.  Then, the patient was positioned in a stable reproducible clinical set-up for radiation therapy.  Wires were placed to delineate the clinical extent of breast tissue. A wire was placed on the scar as well.  CT images were obtained.  An isocenter was placed. Skin markings were placed.  The CT images were loaded into the planning software where the target and avoidance structures were contoured.  The radiation prescription was entered and confirmed. The patient was discharged in stable condition and tolerated simulation well.    TREATMENT PLANNING NOTE:  Treatment planning then occurred. I have requested : MLC's, isodose plan, basic dose calculation  I personally designed and supervised the construction of 3 medically necessary complex treatment devices for the protection of critical normal structures including the lungs and contralateral breast as well as the immobilization device which is necessary for set up certainty.   3D simulation occurred. I requested and analyzed a dose volume histogram of the heart, lungs and lumpectomy cavity.    This document serves as a record of services personally performed by Thea Silversmith, MD. It was created on her behalf by Arlyce Harman, a trained medical scribe. The creation of this record is based on the scribe's personal observations and the provider's statements to them. This document has been checked and approved by the attending provider.

## 2015-01-20 ENCOUNTER — Ambulatory Visit
Admission: RE | Admit: 2015-01-20 | Discharge: 2015-01-20 | Disposition: A | Discharge status: Home / Self Care | Payer: 59 | Source: Ambulatory Visit | Attending: Radiation Oncology | Admitting: Radiation Oncology

## 2015-01-20 ENCOUNTER — Encounter: Payer: Self-pay | Admitting: Radiation Oncology

## 2015-01-20 ENCOUNTER — Telehealth: Payer: Self-pay

## 2015-01-20 VITALS — BP 132/83 | HR 81 | Temp 97.8°F | Resp 16 | Ht 62.75 in | Wt 152.9 lb

## 2015-01-20 DIAGNOSIS — C50211 Malignant neoplasm of upper-inner quadrant of right female breast: Secondary | ICD-10-CM

## 2015-01-20 DIAGNOSIS — Z510 Encounter for antineoplastic radiation therapy: Secondary | ICD-10-CM | POA: Diagnosis not present

## 2015-01-20 NOTE — Addendum Note (Signed)
Encounter addended by: Norm Salt, RN on: 01/20/2015  4:47 PM<BR>     Documentation filed: Charges VN

## 2015-01-20 NOTE — Progress Notes (Signed)
Destiny Moody here for follow up.  The skin on her right breast and underarm has healed.  She reports she has occasional soreness under her right arm.    BP 132/83 mmHg  Pulse 81  Temp(Src) 97.8 F (36.6 C) (Oral)  Resp 16  Ht 5' 2.75" (1.594 m)  Wt 152 lb 14.4 oz (69.355 kg)  BMI 27.30 kg/m2

## 2015-01-20 NOTE — Progress Notes (Signed)
Please see the Nurse Progress Note in the MD Initial Consult Encounter for this patient. 

## 2015-01-20 NOTE — Telephone Encounter (Signed)
PA for Metformin ER 1000 mg

## 2015-01-24 MED ORDER — METFORMIN HCL ER 500 MG PO TB24: ORAL_TABLET | ORAL | Status: DC

## 2015-01-24 NOTE — Telephone Encounter (Signed)
Insurance denied, they will cover only if pt has tried and failed:  Glucophage XR  Did you want to change?

## 2015-01-24 NOTE — Telephone Encounter (Signed)
Yes. Can change to glucophage XR.

## 2015-01-24 NOTE — Telephone Encounter (Signed)
Rx changed and sent to pharmacy.  °

## 2015-01-25 DIAGNOSIS — Z510 Encounter for antineoplastic radiation therapy: Secondary | ICD-10-CM | POA: Diagnosis not present

## 2015-01-27 ENCOUNTER — Ambulatory Visit
Admission: RE | Admit: 2015-01-27 | Discharge: 2015-01-27 | Disposition: A | Discharge status: Home / Self Care | Payer: 59 | Source: Ambulatory Visit | Attending: Radiation Oncology | Admitting: Radiation Oncology

## 2015-01-27 DIAGNOSIS — Z510 Encounter for antineoplastic radiation therapy: Secondary | ICD-10-CM | POA: Diagnosis not present

## 2015-01-31 ENCOUNTER — Ambulatory Visit
Admission: RE | Admit: 2015-01-31 | Discharge: 2015-01-31 | Disposition: A | Discharge status: Home / Self Care | Payer: 59 | Source: Ambulatory Visit | Attending: Radiation Oncology | Admitting: Radiation Oncology

## 2015-01-31 DIAGNOSIS — Z510 Encounter for antineoplastic radiation therapy: Secondary | ICD-10-CM | POA: Diagnosis not present

## 2015-01-31 DIAGNOSIS — C50211 Malignant neoplasm of upper-inner quadrant of right female breast: Secondary | ICD-10-CM

## 2015-01-31 MED ORDER — ALRA NON-METALLIC DEODORANT (RAD-ONC)
1.0000 "application " | Freq: Once | TOPICAL | Status: AC
  Administered 2015-01-31: 1 via TOPICAL

## 2015-01-31 MED ORDER — RADIAPLEXRX EX GEL
Freq: Once | CUTANEOUS | Status: AC
  Administered 2015-01-31: 16:00:00 via TOPICAL

## 2015-02-01 ENCOUNTER — Ambulatory Visit
Admission: RE | Admit: 2015-02-01 | Discharge: 2015-02-01 | Disposition: A | Discharge status: Home / Self Care | Payer: 59 | Source: Ambulatory Visit | Attending: Radiation Oncology | Admitting: Radiation Oncology

## 2015-02-01 VITALS — BP 129/70 | HR 97 | Temp 98.4°F | Wt 154.2 lb

## 2015-02-01 DIAGNOSIS — C50211 Malignant neoplasm of upper-inner quadrant of right female breast: Secondary | ICD-10-CM

## 2015-02-01 DIAGNOSIS — Z510 Encounter for antineoplastic radiation therapy: Secondary | ICD-10-CM | POA: Diagnosis not present

## 2015-02-01 NOTE — Progress Notes (Signed)
Pt here for patient teaching.  Pt given Radiation and You booklet, skin care instructions, Alra deodorant and Radiaplex gel. Reviewed areas of pertinence such as  . Pt able to give teach back of to pat skin, use unscented/gentle soap and drink plenty of water,apply Radiaplex bid, avoid applying anything to skin within 4 hours of treatment, avoid wearing an under wire bra and to use an electric razor if they must shave. Pt demonstrated understanding, needs reinforcement and verbalizes understanding of information given and will contact nursing with any questions or concerns. Patient has completed 2 of 21 treatments to right breast.Reinforced getting up off table slowly from lying to sitting to standing as patient states she was dizzy after treatment yesterday.Patient also was nauseated and anxious. Will continue to need encouragement through treatment process.

## 2015-02-01 NOTE — Progress Notes (Signed)
Weekly Management Note Current Dose:  5.34 Gy  Projected Dose: 42.72 Gy   Narrative:  The patient presents for routine under treatment assessment.  CBCT/MVCT images/Port film x-rays were reviewed.  The chart was checked. Doing well. No complaints except cough and cold.   Physical Findings: Weight: 154 lb 3.2 oz (69.945 kg). Unchanged  Impression:  The patient is tolerating radiation.  Plan:  Continue treatment as planned. RN education performed.

## 2015-02-02 ENCOUNTER — Ambulatory Visit
Admission: RE | Admit: 2015-02-02 | Discharge: 2015-02-02 | Disposition: A | Discharge status: Home / Self Care | Payer: 59 | Source: Ambulatory Visit | Attending: Radiation Oncology | Admitting: Radiation Oncology

## 2015-02-02 DIAGNOSIS — Z510 Encounter for antineoplastic radiation therapy: Secondary | ICD-10-CM | POA: Diagnosis not present

## 2015-02-02 DIAGNOSIS — C50211 Malignant neoplasm of upper-inner quadrant of right female breast: Secondary | ICD-10-CM | POA: Insufficient documentation

## 2015-02-02 DIAGNOSIS — L599 Disorder of the skin and subcutaneous tissue related to radiation, unspecified: Secondary | ICD-10-CM | POA: Diagnosis not present

## 2015-02-03 ENCOUNTER — Ambulatory Visit
Admission: RE | Admit: 2015-02-03 | Discharge: 2015-02-03 | Disposition: A | Discharge status: Home / Self Care | Payer: 59 | Source: Ambulatory Visit | Attending: Radiation Oncology | Admitting: Radiation Oncology

## 2015-02-03 DIAGNOSIS — Z510 Encounter for antineoplastic radiation therapy: Secondary | ICD-10-CM | POA: Diagnosis not present

## 2015-02-04 ENCOUNTER — Ambulatory Visit
Admission: RE | Admit: 2015-02-04 | Discharge: 2015-02-04 | Disposition: A | Discharge status: Home / Self Care | Payer: 59 | Source: Ambulatory Visit | Attending: Radiation Oncology | Admitting: Radiation Oncology

## 2015-02-04 DIAGNOSIS — Z510 Encounter for antineoplastic radiation therapy: Secondary | ICD-10-CM | POA: Diagnosis not present

## 2015-02-07 ENCOUNTER — Ambulatory Visit
Admission: RE | Admit: 2015-02-07 | Discharge: 2015-02-07 | Disposition: A | Discharge status: Home / Self Care | Payer: 59 | Source: Ambulatory Visit | Attending: Radiation Oncology | Admitting: Radiation Oncology

## 2015-02-07 DIAGNOSIS — Z510 Encounter for antineoplastic radiation therapy: Secondary | ICD-10-CM | POA: Diagnosis not present

## 2015-02-08 ENCOUNTER — Ambulatory Visit
Admission: RE | Admit: 2015-02-08 | Discharge: 2015-02-08 | Disposition: A | Discharge status: Home / Self Care | Payer: 59 | Source: Ambulatory Visit | Attending: Radiation Oncology | Admitting: Radiation Oncology

## 2015-02-08 VITALS — Wt 151.6 lb

## 2015-02-08 DIAGNOSIS — Z510 Encounter for antineoplastic radiation therapy: Secondary | ICD-10-CM | POA: Diagnosis not present

## 2015-02-08 DIAGNOSIS — C50211 Malignant neoplasm of upper-inner quadrant of right female breast: Secondary | ICD-10-CM

## 2015-02-08 NOTE — Progress Notes (Signed)
Weekly Management Note Current Dose: 18.69  Gy  Projected Dose: 52.72 Gy   Narrative:  The patient presents for routine under treatment assessment.  CBCT/MVCT images/Port film x-rays were reviewed.  The chart was checked. Questions about diets.   Physical Findings: Weight: 151 lb 9.6 oz (68.765 kg). Skin is slightly dark. No moist desquamation.   Impression:  The patient is tolerating radiation.  Plan:  Continue treatment as planned. Continue radiaplex. Refer to dietician.

## 2015-02-08 NOTE — Progress Notes (Signed)
Weekly rad txs right breast,  7 completed, mild pink tinge on axilla skin intact, radiaplex bid , appetite good , tired stated,  10:10 AM There were no vitals taken for this visit.  Wt Readings from Last 3 Encounters:  02/01/15 154 lb 3.2 oz (69.945 kg)  01/17/15 151 lb (68.493 kg)  01/17/15 151 lb 6.4 oz (68.675 kg)

## 2015-02-09 ENCOUNTER — Ambulatory Visit
Admission: RE | Admit: 2015-02-09 | Discharge: 2015-02-09 | Disposition: A | Discharge status: Home / Self Care | Payer: 59 | Source: Ambulatory Visit | Attending: Radiation Oncology | Admitting: Radiation Oncology

## 2015-02-09 DIAGNOSIS — Z510 Encounter for antineoplastic radiation therapy: Secondary | ICD-10-CM | POA: Diagnosis not present

## 2015-02-10 ENCOUNTER — Ambulatory Visit
Admission: RE | Admit: 2015-02-10 | Discharge: 2015-02-10 | Disposition: A | Discharge status: Home / Self Care | Payer: 59 | Source: Ambulatory Visit | Attending: Radiation Oncology | Admitting: Radiation Oncology

## 2015-02-10 DIAGNOSIS — Z510 Encounter for antineoplastic radiation therapy: Secondary | ICD-10-CM | POA: Diagnosis not present

## 2015-02-11 ENCOUNTER — Ambulatory Visit
Admission: RE | Admit: 2015-02-11 | Discharge: 2015-02-11 | Disposition: A | Discharge status: Home / Self Care | Payer: 59 | Source: Ambulatory Visit | Attending: Radiation Oncology | Admitting: Radiation Oncology

## 2015-02-11 DIAGNOSIS — Z510 Encounter for antineoplastic radiation therapy: Secondary | ICD-10-CM | POA: Diagnosis not present

## 2015-02-15 ENCOUNTER — Ambulatory Visit
Admission: RE | Admit: 2015-02-15 | Discharge: 2015-02-15 | Disposition: A | Discharge status: Home / Self Care | Payer: 59 | Source: Ambulatory Visit | Attending: Radiation Oncology | Admitting: Radiation Oncology

## 2015-02-15 DIAGNOSIS — Z510 Encounter for antineoplastic radiation therapy: Secondary | ICD-10-CM | POA: Diagnosis not present

## 2015-02-15 DIAGNOSIS — C50211 Malignant neoplasm of upper-inner quadrant of right female breast: Secondary | ICD-10-CM

## 2015-02-15 NOTE — Progress Notes (Signed)
Weekly Management Note Current Dose: 29.37  Gy  Projected Dose: 52.72 Gy   Narrative:  The patient presents for routine under treatment assessment.  CBCT/MVCT images/Port film x-rays were reviewed.  The chart was checked. Doing well. Some pain in the lumpectomy site.  Physical Findings: Weight:  . Minimal skin changes  Impression:  The patient is tolerating radiation.  Plan:  Continue treatment as planned.

## 2015-02-16 ENCOUNTER — Ambulatory Visit
Admission: RE | Admit: 2015-02-16 | Discharge: 2015-02-16 | Disposition: A | Discharge status: Home / Self Care | Payer: 59 | Source: Ambulatory Visit | Attending: Radiation Oncology | Admitting: Radiation Oncology

## 2015-02-16 ENCOUNTER — Ambulatory Visit: Payer: 59 | Admitting: Nutrition

## 2015-02-16 DIAGNOSIS — Z510 Encounter for antineoplastic radiation therapy: Secondary | ICD-10-CM | POA: Diagnosis not present

## 2015-02-16 NOTE — Progress Notes (Signed)
64 year old female diagnosed with breast cancer.  Past medical history includes hypothyroidism, rheumatoid arthritis, osteopenia, non-insulin-dependent diabetes, hypercholesterolemia, and osteoporosis.  Medications include calcium with vitamin D, Amaryl, lactobacillus, Glucophage, metformin, multivitamin, Actos, Zocor, and turmeric.  Labs were reviewed.  Height: 63 inches. Weight: 151.6 pounds May 24. Usual body weight: 150 pounds per patient. BMI: 27.06.  Patient reports her husband would like for her to eat healthier foods. Patient reports she does not like healthier foods. She states that she has developed allergies when forced to eat healthy food she does not like. She thinks she is eating less. She complains of fatigue. 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 Patient unable to describe how I can assist her with her nutrition.  Nutrition diagnosis: Food and nutrition related knowledge deficit related to diagnosis of breast cancer as evidenced by no prior need for nutrition related information.  Intervention: Attempted to educate patient on the importance of gradual changes to a healthy plant-based diet. Encouraged maintenance of lean muscle mass throughout treatment. Provided multiple fact sheets on plant-based diet, fatigue, nutrition for breast cancer survivors, and organic foods. Questions were answered.  Teach back method used.  Contact information was given.  Monitoring, evaluation, goals: Patient will tolerate adequate calories and protein to maintain lean muscle mass.  **Disclaimer: This note was dictated with voice recognition software. Similar sounding words can inadvertently be transcribed and this note may contain transcription errors which may not have been corrected upon publication of note.**

## 2015-02-17 ENCOUNTER — Ambulatory Visit
Admission: RE | Admit: 2015-02-17 | Discharge: 2015-02-17 | Disposition: A | Discharge status: Home / Self Care | Payer: 59 | Source: Ambulatory Visit | Attending: Radiation Oncology | Admitting: Radiation Oncology

## 2015-02-17 DIAGNOSIS — Z510 Encounter for antineoplastic radiation therapy: Secondary | ICD-10-CM | POA: Diagnosis not present

## 2015-02-18 ENCOUNTER — Ambulatory Visit
Admission: RE | Admit: 2015-02-18 | Discharge: 2015-02-18 | Disposition: A | Discharge status: Home / Self Care | Payer: 59 | Source: Ambulatory Visit | Attending: Radiation Oncology | Admitting: Radiation Oncology

## 2015-02-18 DIAGNOSIS — Z510 Encounter for antineoplastic radiation therapy: Secondary | ICD-10-CM | POA: Diagnosis not present

## 2015-02-21 ENCOUNTER — Ambulatory Visit
Admission: RE | Admit: 2015-02-21 | Discharge: 2015-02-21 | Disposition: A | Discharge status: Home / Self Care | Payer: 59 | Source: Ambulatory Visit | Attending: Radiation Oncology | Admitting: Radiation Oncology

## 2015-02-21 ENCOUNTER — Ambulatory Visit: Payer: 59 | Admitting: Family Medicine

## 2015-02-21 DIAGNOSIS — Z510 Encounter for antineoplastic radiation therapy: Secondary | ICD-10-CM | POA: Diagnosis not present

## 2015-02-22 ENCOUNTER — Ambulatory Visit
Admission: RE | Admit: 2015-02-22 | Discharge: 2015-02-22 | Disposition: A | Discharge status: Home / Self Care | Payer: 59 | Source: Ambulatory Visit | Attending: Radiation Oncology | Admitting: Radiation Oncology

## 2015-02-22 ENCOUNTER — Ambulatory Visit: Payer: 59 | Admitting: Radiation Oncology

## 2015-02-22 VITALS — BP 119/68 | HR 92 | Temp 98.3°F | Wt 154.2 lb

## 2015-02-22 DIAGNOSIS — Z510 Encounter for antineoplastic radiation therapy: Secondary | ICD-10-CM | POA: Diagnosis not present

## 2015-02-22 DIAGNOSIS — C50211 Malignant neoplasm of upper-inner quadrant of right female breast: Secondary | ICD-10-CM

## 2015-02-22 MED ORDER — RADIAPLEXRX EX GEL: Freq: Once | CUTANEOUS | Status: DC

## 2015-02-22 NOTE — Progress Notes (Signed)
Weekly Management Note Current Dose: 42.72  Gy  Projected Dose:52.72   Gy   Narrative:  The patient presents for routine under treatment assessment.  CBCT/MVCT images/Port film x-rays were reviewed.  The chart was checked. Doing well. Skin looks "ugly". Had a question about a mole on her back.   Physical Findings: Weight: 154 lb 3.2 oz (69.945 kg). Skin over right breast is slightly dark. AK on back.   Impression:  The patient is tolerating radiation.  Plan:  Continue treatment as planned. Copntinue radiaplex. Consult dermatology if she is concerned about area on her back.

## 2015-02-23 ENCOUNTER — Encounter: Payer: Self-pay | Admitting: *Deleted

## 2015-02-23 ENCOUNTER — Other Ambulatory Visit: Payer: Self-pay | Admitting: Oncology

## 2015-02-23 ENCOUNTER — Ambulatory Visit
Admission: RE | Admit: 2015-02-23 | Discharge: 2015-02-23 | Disposition: A | Discharge status: Home / Self Care | Payer: 59 | Source: Ambulatory Visit | Attending: Radiation Oncology | Admitting: Radiation Oncology

## 2015-02-23 ENCOUNTER — Telehealth: Payer: Self-pay | Admitting: Oncology

## 2015-02-23 DIAGNOSIS — Z510 Encounter for antineoplastic radiation therapy: Secondary | ICD-10-CM | POA: Diagnosis not present

## 2015-02-23 DIAGNOSIS — C50211 Malignant neoplasm of upper-inner quadrant of right female breast: Secondary | ICD-10-CM

## 2015-02-23 MED ORDER — RADIAPLEXRX EX GEL
Freq: Once | CUTANEOUS | Status: AC
  Administered 2015-02-23: 16:00:00 via TOPICAL

## 2015-02-23 NOTE — Telephone Encounter (Signed)
Confirmed appointment for July. patient will pick up schedule at next appointment.

## 2015-02-23 NOTE — Progress Notes (Signed)
Pt relayed she wishes to see Dr. Jana Hakim for f/u instead of Dr. Burr Medico b/c she is spanish speaking ad would feel more comfortable. Received clearance from both Dr. Burr Medico and Dr. Jana Hakim that the switch is feasible for both parties.

## 2015-02-24 ENCOUNTER — Ambulatory Visit
Admission: RE | Admit: 2015-02-24 | Discharge: 2015-02-24 | Disposition: A | Discharge status: Home / Self Care | Payer: 59 | Source: Ambulatory Visit | Attending: Radiation Oncology | Admitting: Radiation Oncology

## 2015-02-24 DIAGNOSIS — Z510 Encounter for antineoplastic radiation therapy: Secondary | ICD-10-CM | POA: Diagnosis not present

## 2015-02-25 ENCOUNTER — Ambulatory Visit
Admission: RE | Admit: 2015-02-25 | Discharge: 2015-02-25 | Disposition: A | Discharge status: Home / Self Care | Payer: 59 | Source: Ambulatory Visit | Attending: Radiation Oncology | Admitting: Radiation Oncology

## 2015-02-25 DIAGNOSIS — Z510 Encounter for antineoplastic radiation therapy: Secondary | ICD-10-CM | POA: Diagnosis not present

## 2015-02-28 ENCOUNTER — Ambulatory Visit
Admission: RE | Admit: 2015-02-28 | Discharge: 2015-02-28 | Disposition: A | Discharge status: Home / Self Care | Payer: 59 | Source: Ambulatory Visit | Attending: Radiation Oncology | Admitting: Radiation Oncology

## 2015-02-28 DIAGNOSIS — Z510 Encounter for antineoplastic radiation therapy: Secondary | ICD-10-CM | POA: Diagnosis not present

## 2015-03-01 ENCOUNTER — Ambulatory Visit
Admission: RE | Admit: 2015-03-01 | Discharge: 2015-03-01 | Disposition: A | Discharge status: Home / Self Care | Payer: 59 | Source: Ambulatory Visit | Attending: Radiation Oncology | Admitting: Radiation Oncology

## 2015-03-01 ENCOUNTER — Encounter: Payer: Self-pay | Admitting: Radiation Oncology

## 2015-03-01 VITALS — BP 114/56 | HR 92 | Temp 97.7°F | Wt 153.5 lb

## 2015-03-01 DIAGNOSIS — Z510 Encounter for antineoplastic radiation therapy: Secondary | ICD-10-CM | POA: Diagnosis not present

## 2015-03-01 DIAGNOSIS — C50211 Malignant neoplasm of upper-inner quadrant of right female breast: Secondary | ICD-10-CM

## 2015-03-01 NOTE — Progress Notes (Signed)
  Radiation Oncology         [phone] ________________________________  Name: Destiny Moody MRN: [id number]  Date: 03/01/2015  DOB: 1951/06/24  End of Treatment Note  Diagnosis:  Breast cancer of upper-inner quadrant of right female breast   Staging form: Breast, AJCC 7th Edition     Clinical stage from 09/01/2014: Stage IA (T1c, N0, M0) - Unsigned     Pathologic stage from 10/14/2014: Stage IA (T1c, N0, cM0) - Signed by Seward Grater, MD on 10/19/2014       Staging comments: Staged on final lumpectomy specimen by Dr. Donato Heinz  Indication for treatment:  Curative    Radiation treatment dates:   01/31/15- 03/01/15  Site/dose:   Right breast/ 42.72 Gy at 2.67 Gy per fraction x 21 fractions.  Right breast boost/ 10 Gy at 2 Gy per fraction x 5 fractions  Beams/energy:  Opposed tangents with reduced fields / 6 MV photons Enface electrons / 9 MeV electrons  Narrative: The patient tolerated radiation treatment relatively well.   She had some fatigue and minimal skin irritation. She struggled with left axillary pain as well.   Plan: The patient has completed radiation treatment. The patient will return to radiation oncology clinic for routine followup in one month. I advised them to call or return sooner if they have any questions or concerns related to their recovery or treatment.  ------------------------------------------------  Thea Silversmith, MD

## 2015-03-01 NOTE — Progress Notes (Signed)
Patient has completed 21 treatments to right breast.Mild discoloration with rash.Pain greatest of axilla.Has increased fatigue.To continue application of radiaplex for 2 weeks then change to lotion with vitamin e.Given card to schedule one month follow up. BP 114/56 mmHg  Pulse 92  Temp(Src) 97.7 F (36.5 C)  Wt 153 lb 8 oz (69.627 kg)

## 2015-03-08 ENCOUNTER — Telehealth: Payer: Self-pay

## 2015-03-08 NOTE — Telephone Encounter (Signed)
Patient called and left vm on Shirley's phone that she "feels really bad."Patient call returned she is having some pain of axilla and breast on treatment side.Completed radiation one week ago.Informed not unusual to have discomfort.Radiation still active for approximately 2 weeks and then she will begin to heal and recover.Patient will continue to take aleve or ibuprofen, told to make sure she eats first.To call back by Thursday morning and let me know how she is doing.

## 2015-03-14 NOTE — Progress Notes (Signed)
  Radiation Oncology         [phone] ________________________________  Name: Hennesy Sobalvarro MRN: [id number]  Date: 03/01/2015  DOB: [birth year]  End of Treatment Note  Diagnosis:  Breast cancer of upper-inner quadrant of right female breast   Staging form: Breast, AJCC 7th Edition     Clinical stage from 09/01/2014: Stage IA (T1c, N0, M0) - Unsigned     Pathologic stage from 10/14/2014: Stage IA (T1c, N0, cM0) - Signed by Seward Grater, MD on 10/19/2014       Staging comments: Staged on final lumpectomy specimen by Dr. Donato Heinz     Indication for treatment:  Curative    Radiation treatment dates:   01/31/2015-03/01/2015  Site/dose:   Right breast/ 42.72 Gy at 2.67 Gy per fraction x 21 fractions.  Right breast boost/ 10 Gy at 2 Gy per fraction x 5 fractions  Beams/energy:  Opposed tangents with reduced fields / 6 MV photons Enface electrons / 9 MeV Electrons  Narrative: The patient tolerated radiation treatment relatively well.   She continued to work full time but had significant fatigue.  She also had minimal skin darkness and hyperpigmentation.   Plan: The patient has completed radiation treatment. The patient will return to radiation oncology clinic for routine followup in one month. I advised them to call or return sooner if they have any questions or concerns related to their recovery or treatment.  ------------------------------------------------  Thea Silversmith, MD

## 2015-03-21 NOTE — Progress Notes (Signed)
Name: Destiny Moody   MRN: [id number]  Date:  02/09/25   DOB: 1951/04/16  Status:outpatient    DIAGNOSIS: Breast cancer of upper-inner quadrant of right female breast   Staging form: Breast, AJCC 7th Edition     Clinical stage from 09/01/2014: Stage IA (T1c, N0, M0) - Unsigned     Pathologic stage from 10/14/2014: Stage IA (T1c, N0, cM0) - Signed by Seward Grater, MD on 10/19/2014       Staging comments: Staged on final lumpectomy specimen by Dr. Donato Heinz    CONSENT VERIFIED: yes   SET UP: Patient is setup supine   IMMOBILIZATION:  The following immobilization was used:Custom Moldable Pillow, breast board.   NARRATIVE: Destiny Moody underwent complex simulation and treatment planning for her boost treatment today.  Her tumor volume was outlined on the planning CT scan. The depth of her cavity was felt to be appropriate for treatment with electrons    9  MeV electrons will be prescribed to the100%  isodose line.   I personally oversaw and approved the construction of a unique block which will be used for beam modification purposes.  An isodose plan is requested.

## 2015-03-28 ENCOUNTER — Telehealth: Payer: Self-pay | Admitting: Adult Health

## 2015-03-28 NOTE — Telephone Encounter (Signed)
I called Ms. Borror to attempt to schedule her [facility]orship [facility] appt. I'd like to coordinate her survivorship visit with her routine 63-month follow-up appt with Dr. Pablo Ledger on 04/07/15.  I left my direct office number in the voicemail and asked her to return my call so that we can get her survivorship visit scheduled. I look forward to participating in her care.   Mike Craze, NP [facility] [phone]

## 2015-03-31 ENCOUNTER — Telehealth: Payer: Self-pay | Admitting: Oncology

## 2015-03-31 ENCOUNTER — Ambulatory Visit (HOSPITAL_BASED_OUTPATIENT_CLINIC_OR_DEPARTMENT_OTHER): Payer: 59 | Admitting: Oncology

## 2015-03-31 VITALS — BP 127/62 | HR 83 | Temp 98.2°F | Resp 18 | Ht 62.75 in | Wt 154.9 lb

## 2015-03-31 DIAGNOSIS — Z170 Estrogen receptor positive status [ER+]: Secondary | ICD-10-CM

## 2015-03-31 DIAGNOSIS — E118 Type 2 diabetes mellitus with unspecified complications: Secondary | ICD-10-CM | POA: Insufficient documentation

## 2015-03-31 DIAGNOSIS — E119 Type 2 diabetes mellitus without complications: Secondary | ICD-10-CM

## 2015-03-31 DIAGNOSIS — C50211 Malignant neoplasm of upper-inner quadrant of right female breast: Secondary | ICD-10-CM | POA: Diagnosis not present

## 2015-03-31 MED ORDER — TAMOXIFEN CITRATE 20 MG PO TABS: 20.0000 mg | ORAL_TABLET | Freq: Every day | ORAL | Status: AC

## 2015-03-31 MED ORDER — GABAPENTIN 300 MG PO CAPS: 300.0000 mg | ORAL_CAPSULE | Freq: Every day | ORAL | Status: DC

## 2015-03-31 NOTE — Telephone Encounter (Signed)
Gave avs & calendar for December. °

## 2015-03-31 NOTE — Progress Notes (Signed)
Destiny Moody  Telephone:([phone]) [phone] Fax:(336) [phone]     ID: Destiny Moody DOB: February 25, 1951  MR#: [id number]  IPJ#:[id number]  Patient Care Team: Wendie Agreste, MD as PCP - General (Family Medicine) Terrance Mass, MD as Consulting Physician (Gynecology) Fanny Skates, MD as Consulting Physician (General Surgery) Thea Silversmith, MD as Consulting Physician (Radiation Oncology) Holley Bouche, NP as Nurse Practitioner (Nurse Practitioner) Chauncey Cruel, MD as Consulting Physician (Oncology) PCP: Wendie Agreste, MD OTHER MD:  CHIEF COMPLAINT: Estrogen receptor positive breast cancer  CURRENT TREATMENT: To start tamoxifen   BREAST CANCER HISTORY: From Dr. Ernestina Penna intake note dated 01/17/2015:  "She had abnormal screening mammogram in September 2015, underwent biopsy on 06/15/2014 which was negative for malignancy. She developed I small hematoma after biopsy, which required drainage afterwards. She finally had a bilateral breast MRI , which showed a 1.7 cm non-mass enhancement in the medial right breast. She had repeated mammogram on 08/19/2014 which showed again non-mass like enhancement in the right breast which was biopsied before. 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 She tolerates the biopsy well without any complications "  Her subsequent history is as detailed below.  INTERVAL HISTORY: "Destiny Moody" was evaluated in the breast clinic 03/31/2015. She is establishing herself in my service today.  REVIEW OF SYSTEMS: She is in the middle of radiation. She has pain in the irradiated breast partly from the surgery and partly from the skin changes. Her armpit and in particular is very uncomfortable. She feels fatigued. 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 She has some ringing in her years. She has a dry cough at times. She feels depressed, but not anxious. She tells me her  diabetes is moderately well-controlled. Aside from her work activities, she does not exercise regularly.  PAST MEDICAL HISTORY: Past Medical History  Diagnosis Date  . Hypothyroidism   . Rheumatoid arthritis(714.0)   . Osteopenia   . NIDDM (non-insulin dependent diabetes mellitus)   . Multinodular thyroid   . Hypercholesterolemia   . HSV infection   . Osteoporosis 2015  . Cancer   . Wears contact lenses   . Breast cancer 08/24/14    right, upper inner    PAST SURGICAL HISTORY: Past Surgical History  Procedure Laterality Date  . Cholecystectomy  1993  . Tubal ligation  1996    re annastomosis  . Cesarean section  [id number]  . Colonoscopy    . Radioactive seed guided mastectomy with axillary sentinel lymph node biopsy Right 10/12/2014    Procedure: RIGHT  PARTIAL MASTECTOMY WITH RADIOACTIVE SEED LOCALIZATION  RIGHT  AXILLARY SENTINEL  NODE BIOPSY;  Surgeon: Fanny Skates, MD;  Location: [facility];  Service: General;  Laterality: Right;  . Incision and drainage abscess Right 11/24/2014    Procedure: INCISION AND DRAINAGE RIGHT AXILLA  ABSCESS;  Surgeon: Fanny Skates, MD;  Location: [facility];  Service: General;  Laterality: Right;    FAMILY HISTORY Family History  Problem Relation Age of Onset  . Diabetes Mother     niddm  . Heart disease Mother   . Heart failure Father   . Heart disease Father   . Ovarian cancer Cousin   . Hyperlipidemia Sister    the patient's father died from a myocardial infarction at age 46. The patient's mother died at age 32 from complications of diabetes. The patient had no brothers, one sister. There is no history  of cancer in the immediate family but the patient has one cousin on the mother's side diagnosed with ovarian cancer before the age of 74. Also that cousin's father, the patient's mother's brother, was diagnosed with colon cancer at age 33  GYNECOLOGIC HISTORY:  No LMP recorded. Patient is postmenopausal. Menarche age 49, first  live birth age 81. She is GX P2. She went through the change of life approximately age 30. 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 Her husband [name] is in the rental business. The patient's daughter Lajean Manes works at [employer] in [place], and daughter Elray Mcgregor works at [employer]. The patient has 2 grandchildren. She attends our [place]: Not in place   HEALTH MAINTENANCE: History  Substance Use Topics  . Smoking status: Never Smoker   . Smokeless tobacco: Never Used  . Alcohol Use: Yes     Comment: occasionally     Colonoscopy: Age 73  PAP: Up-to-date  Bone density: Osteoporosis  Lipid panel:  Allergies  Allergen Reactions  . Shellfish Allergy Hives    Current Outpatient Prescriptions  Medication Sig Dispense Refill  . acetaminophen (TYLENOL) 500 MG tablet Take 1,000 mg by mouth every 6 (six) hours as needed.    Marland Kitchen alendronate (FOSAMAX) 70 MG tablet Take 70 mg by mouth once a week. Take with a full glass of water on an empty stomach.    Marland Kitchen amitriptyline (ELAVIL) 25 MG tablet Take 1 tablet (25 mg total) by mouth at bedtime. 30 tablet 1  . aspirin 81 MG tablet Take 81 mg by mouth daily.    . Calcium Carbonate-Vitamin D 600-200 MG-UNIT TABS Take 1,200 mg by mouth.    . Exenatide ER 2 MG PEN Inject 2 mg into the skin once a week. 1 each 12  . fluticasone (FLONASE) 50 MCG/ACT nasal spray Place 2 sprays into both nostrils daily as needed for allergies or rhinitis. 16 g 6  . glimepiride (AMARYL) 2 MG tablet Take 1 tablet (2 mg total) by mouth daily before breakfast. 90 tablet 1  . Glucosamine 500 MG CAPS Take 1 capsule by mouth daily.    Marland Kitchen lactobacillus acidophilus (BACID) TABS tablet Take 2 tablets by mouth 3 (three) times daily.    Marland Kitchen lisinopril (PRINIVIL,ZESTRIL) 2.5 MG tablet Take 1 tablet (2.5 mg total) by mouth daily. 90 tablet 1  . metFORMIN  (GLUCOPHAGE-XR) 500 MG 24 hr tablet Take 2 tablets by mouth twice daily. (Patient taking differently: Take 2 tablets by mouth  daily.) 360 tablet 0  . metFORMIN (GLUMETZA) 1000 MG (MOD) 24 hr tablet Take 1 tablet (1,000 mg total) by mouth 2 (two) times daily with a meal. 180 tablet 1  . Multiple Vitamin (MULTIVITAMIN) capsule Take 1 capsule by mouth daily.    . pioglitazone (ACTOS) 30 MG tablet Take 30 mg by mouth daily.    . simvastatin (ZOCOR) 20 MG tablet Take 1 tablet (20 mg total) by mouth at bedtime. 90 tablet 1  . TURMERIC PO Take 1 capsule by mouth daily.    Marland Kitchen zoster vaccine live, PF, (ZOSTAVAX) 36144 UNT/0.65ML injection Inject 19,400 Units into the skin once. 1 each 0   No current facility-administered medications for this visit.    OBJECTIVE: Middle-aged Latin American woman in no acute distress Filed Vitals:   03/31/15 1213  BP: 127/62  Pulse: 83  Temp: 98.2 F (  36.8 C)  Resp: 18     Body mass index is 27.65 kg/(m^2).    ECOG FS:1 - Symptomatic but completely ambulatory  Ocular: Sclerae unicteric, pupils equal, round and reactive to light Ear-nose-throat: Oropharynx clear and moist Lymphatic: No cervical or supraclavicular adenopathy Lungs no rales or rhonchi, good excursion bilaterally Heart regular rate and rhythm, no murmur appreciated Abd soft, nontender, positive bowel sounds MSK no focal spinal tenderness, no joint edema Neuro: non-focal, well-oriented, appropriate affect Breasts: The right breast is status post lumpectomy and is currently undergoing radiation. There is some dry desquamation and erythema as expected. There is no evidence of residual or recurrent disease. The right axilla is benign. The left breast is unremarkable   LAB RESULTS:  CMP     Component Value Date/Time   NA 140 01/17/2015 1017   NA 139 11/24/2014 0724   K 4.2 01/17/2015 1017   K 3.8 11/24/2014 0724   CL 107 11/24/2014 0724   CO2 22 01/17/2015 1017   CO2 23 11/24/2014 0724    GLUCOSE 136 01/17/2015 1017   GLUCOSE 121* 11/24/2014 0724   BUN 14.4 01/17/2015 1017   BUN 15 11/24/2014 0724   CREATININE 0.7 01/17/2015 1017   CREATININE 0.70 11/24/2014 0724   CALCIUM 9.0 01/17/2015 1017   CALCIUM 9.4 11/24/2014 0724   PROT 6.8 01/17/2015 1017   PROT 7.5 10/11/2014 1600   ALBUMIN 3.7 01/17/2015 1017   ALBUMIN 4.1 10/11/2014 1600   AST 19 01/17/2015 1017   AST 26 10/11/2014 1600   ALT 25 01/17/2015 1017   ALT 26 10/11/2014 1600   ALKPHOS 84 01/17/2015 1017   ALKPHOS 80 10/11/2014 1600   BILITOT 0.32 01/17/2015 1017   BILITOT 0.4 10/11/2014 1600   GFRNONAA >90 11/24/2014 0724   GFRAA >90 11/24/2014 0724    INo results found for: SPEP, UPEP  Lab Results  Component Value Date   WBC 6.9 01/17/2015   NEUTROABS 3.9 01/17/2015   HGB 12.5 01/17/2015   HCT 37.1 01/17/2015   MCV 91.2 01/17/2015   PLT 246 01/17/2015      Chemistry      Component Value Date/Time   NA 140 01/17/2015 1017   NA 139 11/24/2014 0724   K 4.2 01/17/2015 1017   K 3.8 11/24/2014 0724   CL 107 11/24/2014 0724   CO2 22 01/17/2015 1017   CO2 23 11/24/2014 0724   BUN 14.4 01/17/2015 1017   BUN 15 11/24/2014 0724   CREATININE 0.7 01/17/2015 1017   CREATININE 0.70 11/24/2014 0724      Component Value Date/Time   CALCIUM 9.0 01/17/2015 1017   CALCIUM 9.4 11/24/2014 0724   ALKPHOS 84 01/17/2015 1017   ALKPHOS 80 10/11/2014 1600   AST 19 01/17/2015 1017   AST 26 10/11/2014 1600   ALT 25 01/17/2015 1017   ALT 26 10/11/2014 1600   BILITOT 0.32 01/17/2015 1017   BILITOT 0.4 10/11/2014 1600       No results found for: LABCA2  No components found for: LABCA125  No results for input(s): INR in the last 168 hours.  Urinalysis    Component Value Date/Time   COLORURINE YELLOW 04/19/2008 1937   APPEARANCEUR CLOUDY* 04/19/2008 1937   LABSPEC 1.011 04/19/2008 1937   PHURINE 6.5 04/19/2008 1937   GLUCOSEU NEGATIVE 04/19/2008 1937   HGBUR MODERATE* 04/19/2008 1937    BILIRUBINUR NEGATIVE 04/19/2008 1937   KETONESUR TRACE* 04/19/2008 1937   PROTEINUR 30* 04/19/2008 1937   UROBILINOGEN 0.2  04/19/2008 1937   NITRITE NEGATIVE 04/19/2008 1937   LEUKOCYTESUR LARGE* 04/19/2008 1937    STUDIES: No results found.  ASSESSMENT: 64 y.o. [place] woman status post right breast upper inner quadrant biopsy 08/24/2014 for a clinical T1c N0, stage IA invasive ductal carcinoma, grade 1 or 2, strongly estrogen and progesterone receptor positive, with an MIB-1 of 12%, and no HER-2 amplification  (1) status post right lumpectomy and sentinel lymph node sampling 10/12/2014 for a pT1c pN), stage IA invasive ductal carcinoma, grade 1, with ample margins; repeat HER-2 again negative  (2) Adjuvant radiation 01/31/2015-03/01/2015:  Right breast/ 42.72 Gy at 2.67 Gy per fraction x 21 fractions.  Right breast boost/ 10 Gy at 2 Gy per fraction x 5 fractions  (3) to start tamoxifen 05/19/2015  PLAN:  I spent approximately one hour today with Destiny Moody and clarified many questions and concerns that were troubling her. We reviewed her diagnosis, treatment history, and prognosis. In particular she understands that she has had excellent local treatment and that her risk of recurrence in the breast is therefore quite low.  So far however she has had no systemic treatment. She understands this is what can take a person's life, namely recurrence in the liver, lungs, or bones, for example. To help [place] in the systemic treatment decision she had an Oncotype sent which came back with a score of 14, predicting a 9% risk of outside the breast recurrence within 10 years if she took tamoxifen for 5 years.  We discussed the possible toxicities, side effects and complications of tamoxifen and she has written information also on the possible side effects of aromatase inhibitors.  Because she is still recovering from her radiation I think waiting a little before starting tamoxifen will help with  compliance. We are going to defer starting tamoxifen until September 1. She is going to return to see me in early December and if she is tolerating tamoxifen well the plan will be to continue that at least for 2 years before considering switching to an aromatase inhibitor.  I reassured her that the pain she is feeling in the right breast area and right axilla are due to her prior treatment and not to cancer. She can take Aleve for this as needed. I am also prescribing gabapentin for her to take at bedtime, which I think will provide her significant relief.  Incidentally the patient tells me she has a cousin with ovarian cancer diagnosed at an early age. I am placing a referral to genetics accordingly.  Destiny Moody  has a good understanding of the overall plan. She agrees with it. She knows the goal of treatment in her case is cure. She will call with any problems that may develop before her next visit here.  Chauncey Cruel, MD   03/31/2015 1:31 PM Medical Oncology and Hematology [facility] [address] [place], [place] [address] Tel. [phone]    Fax. [phone]

## 2015-04-01 ENCOUNTER — Telehealth: Payer: Self-pay | Admitting: Oncology

## 2015-04-01 NOTE — Telephone Encounter (Signed)
lvm for pt regarding to 7.20 appt....pto k and aware

## 2015-04-06 ENCOUNTER — Encounter: Payer: Self-pay | Admitting: Genetic Counselor

## 2015-04-06 ENCOUNTER — Ambulatory Visit (HOSPITAL_BASED_OUTPATIENT_CLINIC_OR_DEPARTMENT_OTHER): Payer: 59 | Admitting: Genetic Counselor

## 2015-04-06 ENCOUNTER — Other Ambulatory Visit (HOSPITAL_BASED_OUTPATIENT_CLINIC_OR_DEPARTMENT_OTHER): Payer: 59

## 2015-04-06 DIAGNOSIS — Z803 Family history of malignant neoplasm of breast: Secondary | ICD-10-CM | POA: Diagnosis not present

## 2015-04-06 DIAGNOSIS — C50211 Malignant neoplasm of upper-inner quadrant of right female breast: Secondary | ICD-10-CM

## 2015-04-06 DIAGNOSIS — Z8041 Family history of malignant neoplasm of ovary: Secondary | ICD-10-CM | POA: Diagnosis not present

## 2015-04-06 DIAGNOSIS — Z315 Encounter for genetic counseling: Secondary | ICD-10-CM

## 2015-04-06 DIAGNOSIS — Z800 Family history of malignant neoplasm of digestive organs: Secondary | ICD-10-CM

## 2015-04-06 LAB — COMPREHENSIVE METABOLIC PANEL (CC13)
ALK PHOS: 72 U/L (ref 40–150)
ALT: 30 U/L (ref 0–55)
AST: 26 U/L (ref 5–34)
Albumin: 3.9 g/dL (ref 3.5–5.0)
Anion Gap: 8 mEq/L (ref 3–11)
BUN: 12.1 mg/dL (ref 7.0–26.0)
CO2: 27 mEq/L (ref 22–29)
Calcium: 9.6 mg/dL (ref 8.4–10.4)
Chloride: 106 mEq/L (ref 98–109)
Creatinine: 0.8 mg/dL (ref 0.6–1.1)
EGFR: 81 mL/min/{1.73_m2} — AB (ref >90)
Glucose: 169 mg/dl — ABNORMAL HIGH (ref 70–140)
Potassium: 4.1 mEq/L (ref 3.5–5.1)
SODIUM: 140 meq/L (ref 136–145)
Total Bilirubin: 0.49 mg/dL (ref 0.20–1.20)
Total Protein: 7.3 g/dL (ref 6.4–8.3)

## 2015-04-06 LAB — CBC WITH DIFFERENTIAL/PLATELET
BASO%: 0.2 % (ref 0.0–2.0)
BASOS ABS: 0 10*3/uL (ref 0.0–0.1)
EOS%: 2.4 % (ref 0.0–7.0)
Eosinophils Absolute: 0.1 10*3/uL (ref 0.0–0.5)
HCT: 39.4 % (ref 34.8–46.6)
HEMOGLOBIN: 13.4 g/dL (ref 11.6–15.9)
LYMPH%: 34.8 % (ref 14.0–49.7)
MCH: 30.8 pg (ref 25.1–34.0)
MCHC: 34 g/dL (ref 31.5–36.0)
MCV: 90.6 fL (ref 79.5–101.0)
MONO#: 0.5 10*3/uL (ref 0.1–0.9)
MONO%: 9.1 % (ref 0.0–14.0)
NEUT%: 53.5 % (ref 38.4–76.8)
NEUTROS ABS: 2.7 10*3/uL (ref 1.5–6.5)
Platelets: 243 10*3/uL (ref 145–400)
RBC: 4.35 10*6/uL (ref 3.70–5.45)
RDW: 12.9 % (ref 11.2–14.5)
WBC: 5.1 10*3/uL (ref 3.9–10.3)
lymph#: 1.8 10*3/uL (ref 0.9–3.3)

## 2015-04-06 NOTE — Progress Notes (Signed)
REFERRING PROVIDER: Wendie Agreste, MD [place], [place] [address]   Destiny Del, MD  PRIMARY PROVIDER:  Wendie Agreste, MD  PRIMARY REASON FOR VISIT:  1. Breast cancer of upper-inner quadrant of right female breast   2. Family history of colon cancer   3. Family history of ovarian cancer      HISTORY OF PRESENT ILLNESS:   Destiny Moody, a 64 y.o. female, was seen for a [facility] cancer genetics consultation at the request of Dr. Carlota Raspberry due to a personal and family history of cancer.  Destiny Moody presents to clinic today to discuss the possibility of a hereditary predisposition to cancer, genetic testing, and to further clarify her future cancer risks, as well as potential cancer risks for family members.   In 2015, at the age of 75, Destiny Moody was diagnosed with DCIS and IDC of the right breast. This was treated with lumpectomy.  The tumor was ER+/PR+/Her2-.     CANCER HISTORY:  Oncology History   Breast cancer of upper-inner quadrant of right female breast   Staging form: Breast, AJCC 7th Edition     Clinical stage from 09/01/2014: Stage IA (T1c, N0, M0) - Unsigned     Pathologic stage from 10/14/2014: Stage IA (T1c, N0, cM0) - Signed by Seward Grater, MD on 10/19/2014       Staging comments: Staged on final lumpectomy specimen by Dr. Donato Heinz        Breast cancer of upper-inner quadrant of right female breast   08/05/2014 Breast MRI 1.7cm non mass enhancement in the medial right breast   08/24/2014 Initial Biopsy IDA and DCIS, ER 100%, PR 99%, HER2 -, Ki 67% 12%.    08/26/2014 Initial Diagnosis Breast cancer of upper-inner quadrant of right female breast   10/12/2014 Surgery right lumpectomy and SLN biopsy. IDA and DCIS, pT1cN0, negative margins    11/24/2014 Surgery right axillary abscess incision and drainage     HORMONAL RISK FACTORS:  Menarche was at age 65.  First live birth at age 40.  OCP use for approximately 3-4 years.  Ovaries intact: yes.   Hysterectomy: no.  Menopausal status: postmenopausal.  HRT use: 0 years. Colonoscopy: yes; normal. Mammogram within the last year: yes. Number of breast biopsies: 3. Up to date with pelvic exams:  yes. Any excessive radiation exposure in the past:  No  Past Medical History  Diagnosis Date  . Hypothyroidism   . Rheumatoid arthritis(714.0)   . Osteopenia   . NIDDM (non-insulin dependent diabetes mellitus)   . Multinodular thyroid   . Hypercholesterolemia   . HSV infection   . Osteoporosis 2015  . Cancer   . Wears contact lenses   . Breast cancer 08/24/14    right, upper inner  . Family history of colon cancer   . Family history of ovarian cancer     Past Surgical History  Procedure Laterality Date  . Cholecystectomy  1993  . Tubal ligation  1996    re annastomosis  . Cesarean section  [id number]  . Colonoscopy    . Radioactive seed guided mastectomy with axillary sentinel lymph node biopsy Right 10/12/2014    Procedure: RIGHT  PARTIAL MASTECTOMY WITH RADIOACTIVE SEED LOCALIZATION  RIGHT  AXILLARY SENTINEL  NODE BIOPSY;  Surgeon: Fanny Skates, MD;  Location: [facility];  Service: General;  Laterality: Right;  . Incision and drainage abscess Right 11/24/2014    Procedure: INCISION AND DRAINAGE RIGHT AXILLA  ABSCESS;  Surgeon:  Fanny Skates, MD;  Location: [facility];  Service: General;  Laterality: Right;    History   Social History  . Marital Status: Married    Spouse Name: [name]  . Number of Children: 2  . Years of Education: College   Occupational History  .      Self Employed   Social History Main Topics  . Smoking status: Never Smoker   . Smokeless tobacco: Never Used  . Alcohol Use: Yes     Comment: occasionally  . Drug Use: No  . Sexual Activity: Not on file     Comment: menarche age 67, G69 , P 2, menopause age 39, no HRT   Other Topics Concern  . None   Social History Narrative   Patient lives at home with her spouse.   Caffeine use: 1  cup daily     FAMILY HISTORY:  We obtained a detailed, 4-generation family history.  Significant diagnoses are listed below: Family History  Problem Relation Age of Onset  . Diabetes Mother     niddm  . Heart disease Mother   . Heart failure Father   . Heart disease Father   . Ovarian cancer Cousin 9  . Hyperlipidemia Sister   . Colon cancer Maternal Uncle 22   The patient has two daughters who are healthy, although one has something going on with her pituitary which is causing her to lose pregnancies.  She has one maternal half sister who is healthy.  The aptient's mother died at 47 from complications of diabetes.  She was the youngest of 107 brothers and sisters.  One brother had colon cancer and he had a daughter with ovarian cancer when she was 64.  No other family history is known as most individuals died before the patient was born. Patient's maternal ancestors are of [place] and [place] descent, and paternal ancestors are of [place] descent. There is no reported Ashkenazi Jewish ancestry. There is no known consanguinity.  GENETIC COUNSELING ASSESSMENT: Zariyah Stephens is a 64 y.o. female with a personal history of breast cancer and family history of colon and ovarian cancer which somewhat suggestive of a hereditary cancer syndrome and predisposition to cancer. We, therefore, discussed and recommended the following at today's visit.   DISCUSSION: We reviewed the characteristics, features and inheritance patterns of hereditary cancer syndromes. We reviewed BRCA mutations based on the FH of ovarian cancer.  We also reviewed Lynch syndrome because of the family history of colon and ovarian cancer.  We also discussed genetic testing, including the appropriate family members to test, the process of testing, insurance coverage and turn-around-time for results. We discussed the implications of a negative, positive and/or variant of uncertain significant result. We recommended Destiny Moody pursue  genetic testing for the OvaNext gene panel. The OvaNext gene panel offered by [facility] and includes sequencing and rearrangement analysis for the following 24 genes: ATM, BARD1, BRCA1, BRCA2, BRIP1, CDH1, CHEK2, EPCAM, MLH1, MRE11A, MSH2, MSH6, MUTYH, NBN, NF1, PALB2, PMS2, PTEN, RAD50, RAD51C, RAD51D, SMARCA4, STK11, and TP53.   Based on Destiny Moody personal and family history of cancer, she meets medical criteria for genetic testing. Despite that she meets criteria, she may still have an out of pocket cost. We discussed that if her out of pocket cost for testing is over $100, the laboratory will call and confirm whether she wants to proceed with testing.  If the out of pocket cost of testing is less than $100 she will be billed by  the genetic testing laboratory.   PLAN: After considering the risks, benefits, and limitations, Destiny Moody  provided informed consent to pursue genetic testing and the blood sample was sent to [facility] for analysis of the OvaNext panel test. Results should be available within approximately 2-3 weeks' time, at which point they will be disclosed by telephone to Destiny Moody, as will any additional recommendations warranted by these results. Destiny Moody will receive a summary of her genetic counseling visit and a copy of her results once available. This information will also be available in Epic. We encouraged Destiny Moody to remain in contact with cancer genetics annually so that we can continuously update the family history and inform her of any changes in cancer genetics and testing that may be of benefit for her family. Destiny Moody questions were answered to her satisfaction today. Our contact information was provided should additional questions or concerns arise.  Lastly, we encouraged Destiny Moody to remain in contact with cancer genetics annually so that we can continuously update the family history and inform her of any changes in cancer genetics and  testing that may be of benefit for this family.   Ms.  Moody questions were answered to her satisfaction today. Our contact information was provided should additional questions or concerns arise. Thank you for the referral and allowing [place] to share in the care of your patient.   Armaan Pond P. Florene Glen, [place], [facility] Certified Genetic Counselor [email or web address] phone: [phone]  The patient was seen for a total of 40 minutes in face-to-face genetic counseling.  This patient was discussed with Drs. Magrinat, Lindi Adie and/or Burr Medico who agrees with the above.    _______________________________________________________________________ For Office Staff:  Number of people involved in session: 1 Was an Intern/ student involved with case: no

## 2015-04-07 ENCOUNTER — Ambulatory Visit
Admission: RE | Admit: 2015-04-07 | Discharge: 2015-04-07 | Disposition: A | Discharge status: Home / Self Care | Payer: 59 | Source: Ambulatory Visit | Attending: Radiation Oncology | Admitting: Radiation Oncology

## 2015-04-07 VITALS — BP 133/66 | HR 82 | Temp 98.1°F | Wt 157.3 lb

## 2015-04-07 DIAGNOSIS — C50211 Malignant neoplasm of upper-inner quadrant of right female breast: Secondary | ICD-10-CM

## 2015-04-07 NOTE — Progress Notes (Signed)
One month follow up completion of radiation to right breast 01/31/15-03/01/15.Defer start of tamoxifen until September 1 , 2016, follow up with Dr.Magrinat Dec. 2016.Still has mild tanning of skin.told to get lotion with vitamin e and continue until skin discoloration is clear.has some discomfort of right shoulder and axilla.Continue tylenol as needed.

## 2015-04-07 NOTE — Progress Notes (Signed)
Department of Radiation Oncology  Phone:  [phone] Fax:        [phone]   Name: Destiny Moody MRN: [id number]  DOB: [birth year]  Date: 04/07/2015  Follow Up Visit Note  Diagnosis: Breast cancer of upper-inner quadrant of right female breast   Staging form: Breast, AJCC 7th Edition     Clinical stage from 09/01/2014: Stage IA (T1c, N0, M0) - Unsigned     Pathologic stage from 10/14/2014: Stage IA (T1c, N0, cM0) - Signed by Seward Grater, MD on 10/19/2014       Staging comments: Staged on final lumpectomy specimen by Dr. Donato Heinz  Summary and Interval since last radiation: Radiation treatment dates extended from 01/31/2015 to 03/01/2015. 52.72 Gy to the right breast.   Interval History: Destiny Moody presents today for routine followup. The pt states she still has mild tanning of her skin. She has some discomfort of right shoulder and axilla. She continues to use tylenol as needed. She reports that the breast pain is better since getting radiation and only hurts at she goes to bed. The pt reports that she is not a physical therapy class due to it being booked but knows she needs to call.  She has some swelling of her right upper arm occasionally. She has not taken the gabapentin as she thinks she has taken it in the past and not tolerated it well. She is scheduled to start her tamoxifen in September.   Current Outpatient Prescriptions on File Prior to Encounter  Medication Sig Dispense Refill  . acetaminophen (TYLENOL) 500 MG tablet Take 1,000 mg by mouth every 6 (six) hours as needed.    Marland Kitchen alendronate (FOSAMAX) 70 MG tablet Take 70 mg by mouth once a week. Take with a full glass of water on an empty stomach.    Marland Kitchen amitriptyline (ELAVIL) 25 MG tablet Take 1 tablet (25 mg total) by mouth at bedtime. 30 tablet 1  . aspirin 81 MG tablet Take 81 mg by mouth daily.    . Calcium Carbonate-Vitamin D 600-200 MG-UNIT TABS Take 1,200 mg by mouth.    . Exenatide ER 2 MG PEN Inject 2 mg into the skin once a  week. 1 each 12  . fluticasone (FLONASE) 50 MCG/ACT nasal spray Place 2 sprays into both nostrils daily as needed for allergies or rhinitis. 16 g 6  . gabapentin (NEURONTIN) 300 MG capsule Take 1 capsule (300 mg total) by mouth at bedtime. 90 capsule 4  . glimepiride (AMARYL) 2 MG tablet Take 1 tablet (2 mg total) by mouth daily before breakfast. 90 tablet 1  . Glucosamine 500 MG CAPS Take 1 capsule by mouth daily.    Marland Kitchen lactobacillus acidophilus (BACID) TABS tablet Take 2 tablets by mouth 3 (three) times daily.    Marland Kitchen lisinopril (PRINIVIL,ZESTRIL) 2.5 MG tablet Take 1 tablet (2.5 mg total) by mouth daily. 90 tablet 1  . metFORMIN (GLUMETZA) 1000 MG (MOD) 24 hr tablet Take 1 tablet (1,000 mg total) by mouth 2 (two) times daily with a meal. 180 tablet 1  . Multiple Vitamin (MULTIVITAMIN) capsule Take 1 capsule by mouth daily.    . pioglitazone (ACTOS) 30 MG tablet Take 30 mg by mouth daily.    . simvastatin (ZOCOR) 20 MG tablet Take 1 tablet (20 mg total) by mouth at bedtime. 90 tablet 1  . TURMERIC PO Take 1 capsule by mouth daily.    Marland Kitchen zoster vaccine live, PF, (ZOSTAVAX) 92330 UNT/0.65ML injection Inject 19,400 Units into the  skin once. 1 each 0  . tamoxifen (NOLVADEX) 20 MG tablet Take 1 tablet (20 mg total) by mouth daily. (Patient not taking: Reported on 04/07/2015) 90 tablet 12   No current facility-administered medications on file prior to encounter.   Physical Exam:  Filed Vitals:   04/07/15 1624  BP: 133/66  Pulse: 82  Temp: 98.1 F (36.7 C)  Weight: 157 lb 4.8 oz (71.351 kg)  Pleasant woman who appears her stated aged. Alert and oriented times three. Lightly tanned right breast.  IMPRESSION: Destiny Moody is a 64 y.o. female presenting to the clinic in regards to her T1cN0cM0 cancer of the right female breast with resolving acute effects of radiation. Marland Kitchen  PLAN: I advised the pt to practice good sun hygiene and to contact physical therapy again. I also advised the pt to use vitamin E lotion  to the radiation treatment area. The pt will start Tamoxifen on September 1 and f/u with Dr. Jana Hakim in December as scheduled. She can see me on a prn basis and call me if she has any questions.  This document serves as a record of services personally performed by Thea Silversmith, MD. It was created on her behalf by Darcus Austin, a trained medical scribe. The creation of this record is based on the scribe's personal observations and the provider's statements to them. This document has been checked and approved by the attending provider.   ------------------------------------------------  Thea Silversmith, MD

## 2015-04-08 ENCOUNTER — Other Ambulatory Visit: Payer: Self-pay | Admitting: Physician Assistant

## 2015-04-08 ENCOUNTER — Telehealth: Payer: Self-pay

## 2015-04-08 MED ORDER — PIOGLITAZONE HCL 30 MG PO TABS: 30.0000 mg | ORAL_TABLET | Freq: Every day | ORAL | Status: DC

## 2015-04-08 NOTE — Telephone Encounter (Signed)
Pt would like a refill on her script. She would like [place] to use the pharmacy at [facility] on [address] and [address]. Please advise at [phone]  pioglitazone (ACTOS) 30 MG tablet [[id number]

## 2015-04-08 NOTE — Telephone Encounter (Signed)
Can we refill? 

## 2015-04-27 ENCOUNTER — Encounter: Payer: Self-pay | Admitting: Genetic Counselor

## 2015-04-27 ENCOUNTER — Ambulatory Visit: Payer: Self-pay | Admitting: Genetic Counselor

## 2015-04-27 ENCOUNTER — Telehealth: Payer: Self-pay | Admitting: Genetic Counselor

## 2015-04-27 DIAGNOSIS — Z8041 Family history of malignant neoplasm of ovary: Secondary | ICD-10-CM

## 2015-04-27 DIAGNOSIS — Z800 Family history of malignant neoplasm of digestive organs: Secondary | ICD-10-CM

## 2015-04-27 DIAGNOSIS — Z1379 Encounter for other screening for genetic and chromosomal anomalies: Secondary | ICD-10-CM | POA: Insufficient documentation

## 2015-04-27 DIAGNOSIS — C50211 Malignant neoplasm of upper-inner quadrant of right female breast: Secondary | ICD-10-CM

## 2015-04-27 NOTE — Progress Notes (Signed)
HPI: Ms. Brandvold was previously seen in the [place] clinic due to a personal history of breast cancer and family history of colon and ovarian cancer and concerns regarding a hereditary predisposition to cancer. Please refer to our prior cancer genetics clinic note for more information regarding Ms. Holtmeyer's medical, social and family histories, and our assessment and recommendations, at the time. Ms. Stauch recent genetic test results were disclosed to her, as were recommendations warranted by these results. These results and recommendations are discussed in more detail below.  GENETIC TEST RESULTS: At the time of Ms. Stanek's visit, we recommended she pursue genetic testing of the OvaNext gene panel. The OvaNext gene panel offered by [facility] and includes sequencing and rearrangement analysis for the following 24 genes: ATM, BARD1, BRCA1, BRCA2, BRIP1, CDH1, CHEK2, EPCAM, MLH1, MRE11A, MSH2, MSH6, MUTYH, NBN, NF1, PALB2, PMS2, PTEN, RAD50, RAD51C, RAD51D, SMARCA4, STK11, and TP53.  The report date is April 26, 2015.  Genetic testing was normal, and did not reveal a deleterious mutation in these genes. The test report has been scanned into EPIC and is located under the Media tab.   We discussed with Ms. Enck that since the current genetic testing is not perfect, it is possible there may be a gene mutation in one of these genes that current testing cannot detect, but that chance is small. We also discussed, that it is possible that another gene that has not yet been discovered, or that we have not yet tested, is responsible for the cancer diagnoses in the family, and it is, therefore, important to remain in touch with cancer genetics in the future so that we can continue to offer Ms. Sandeen the most up to date genetic testing.   CANCER SCREENING RECOMMENDATIONS: This result is reassuring and indicates that Ms. Rancourt likely does not have an increased risk for a future  cancer due to a mutation in one of these genes. This normal test also suggests that Ms. Youngberg's cancer was most likely not due to an inherited predisposition associated with one of these genes.  Most cancers happen by chance and this negative test suggests that her cancer falls into this category.  We, therefore, recommended she continue to follow the cancer management and screening guidelines provided by her oncology and primary healthcare provider.   RECOMMENDATIONS FOR FAMILY MEMBERS: Women in this family might be at some increased risk of developing cancer, over the general population risk, simply due to the family history of cancer. We recommended women in this family have a yearly mammogram beginning at age 42, or 29 years younger than the earliest onset of cancer, an an annual clinical breast exam, and perform monthly breast self-exams. Women in this family should also have a gynecological exam as recommended by their primary provider. All family members should have a colonoscopy by age 21.  FOLLOW-UP: Lastly, we discussed with Ms. Alessandrini that cancer genetics is a rapidly advancing field and it is possible that new genetic tests will be appropriate for her and/or her family members in the future. We encouraged her to remain in contact with cancer genetics on an annual basis so we can update her personal and family histories and let her know of advances in cancer genetics that may benefit this family.   Our contact number was provided. Ms. Fullenwider questions were answered to her satisfaction, and she knows she is welcome to call us at anytime with additional questions or concerns.   Roma Kayser, MS,  [facility] Certified Dietitian [email or web address]

## 2015-04-27 NOTE — Telephone Encounter (Signed)
Revealed negative genetic testing on the OvaNext panel.

## 2015-05-02 ENCOUNTER — Telehealth: Payer: Self-pay | Admitting: Adult Health

## 2015-05-02 ENCOUNTER — Encounter: Payer: Self-pay | Admitting: Nurse Practitioner

## 2015-05-02 NOTE — Telephone Encounter (Signed)
I spoke with Destiny Moody to schedule her Survivorship Clinic appt to see Chestine Spore, NP.  I gave her instructions on where to check in for this appt.  She stated that she did not feel like she needed an interpreter for her visit.  We look forward to participating in her care.   Mike Craze, NP [place] [phone]

## 2015-05-07 ENCOUNTER — Other Ambulatory Visit: Payer: Self-pay | Admitting: Family Medicine

## 2015-05-10 ENCOUNTER — Encounter: Payer: Self-pay | Admitting: Nurse Practitioner

## 2015-05-10 ENCOUNTER — Ambulatory Visit (HOSPITAL_BASED_OUTPATIENT_CLINIC_OR_DEPARTMENT_OTHER): Payer: 59 | Admitting: Nurse Practitioner

## 2015-05-10 DIAGNOSIS — Z170 Estrogen receptor positive status [ER+]: Secondary | ICD-10-CM

## 2015-05-10 DIAGNOSIS — C50211 Malignant neoplasm of upper-inner quadrant of right female breast: Secondary | ICD-10-CM | POA: Diagnosis not present

## 2015-05-10 NOTE — Progress Notes (Signed)
CLINIC:  Cancer Survivorship   REASON FOR VISIT:  Routine follow-up post-treatment for a recent history of breast cancer.  BRIEF ONCOLOGIC HISTORY:    Breast cancer of upper-inner quadrant of right female breast   06/11/2014 Mammogram Diagnostic mammogram: Asymmetric density in the medial aspect of the right breast associated with mild distortion. No discrete mass or suspicious microcalcifications identified. No sonographic correlate.   06/15/2014 Initial Biopsy Right breast needle core bx: multiple benign areas of fibrovascular and adipose soft tissue; no evidence of malignancy.  Discordant with imaging.  Plan for MRI in 6-8 weeks post biopsy. Small hematoma formation following biopsy necessitating drainage.   08/04/2014 Breast MRI Post biopsy changes in the medial right breast at posterior depth. A 1.7 cm AP diameter area of linear clumped non mass enhancement is noted superior and lateral to the biopsy site representing good MR correlate to the previous mammo finding.   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 DCIS present.   10/12/2014 Definitive Surgery Right breast lumpectomy with SLNB Destiny Moody): IDC with DCIS. Grade 1. Repeat HER2/NEU negative (ratio 1.13).  Right axillary LN negative for tumor.  Negative margins.   10/12/2014 Pathologic Stage Stage IA: T1c N0   10/12/2014 Clinical Stage Stage IA (T1cN0)   10/12/2014 Oncotype testing Recurrence score 14 (9% ROR). No chemo (Magrinat).   11/24/2014 Surgery Right axillary abscess incision and drainage.  RT delayed to allow for healing.   01/31/2015 - 03/01/2015 Radiation Therapy Adjuvant RT completed Destiny Moody): Right breast 42.72 Gy over 21 fractions; right breast boost 10 Gy over 5 fractions.  Total dose: 52.72 Gy.   04/26/2015 Procedure Genetic testing: OvaNext panel Destiny Moody) shows no clinically significant variants in ATM, BARD1, BRCA1,  BRCA2, BRIP1, CDH1, CHEK2, EPCAM, MLH1, MRE11A, MSH2, MSH6, MUTYH, NBN, NF1, PALB2, PMS2, PTEN, RAD50, RAD51C, RAD51D, SMARCA4, STK11, and TP53.    Anti-estrogen oral therapy Plan to begin Tamoxifen 20 mg daily May 19, 2015 (Magrinat) to allow recovery from RT.  Plan for 2 years with consideration of change to AI at that time.    INTERVAL HISTORY:  Destiny Moody presents to the Survivorship Clinic today for our initial meeting to review her survivorship care plan detailing her treatment course for breast cancer, as well as monitoring long-term side effects of that treatment, education regarding health maintenance, screening, and overall wellness and health promotion.     Overall, Destiny Moody reports feeling quite well since completing her radiation therapy approximately 8 weeks ago.  She has had some intermittent pain in her right axilla since completion of treatment.  It is not worsening and may be getting somewhat better.  She denies any mass, redness, or swelling in the axilla.  Similarly, she denies any mass or lesion within her right breast.  She does have some intermittent fatigue and tends to do things at a slower pace.  This is no worse.  She has not yet begun her Tamoxifen but plans to do so May 19, 2015 to allow additional recovery following completion of her radiation.  She has a follow up appointment in 3 months with Dr. Darnelle Catalan.  REVIEW OF SYSTEMS:  General: Denies fever, chills, or unintentional weight loss. Fatigue as above. Cardiac: Denies palpitations, chest pain, and lower extremity edema.  Respiratory: Denies cough, shortness of breath, and dyspnea on exertion.  GI: Denies abdominal pain, constipation, diarrhea, nausea, or vomiting.  GU: Denies dysuria, hematuria,  vaginal bleeding, vaginal discharge, or vaginal dryness.  Musculoskeletal: Denies joint or bone pain.  Neuro: Denies headache or recent falls. Denies peripheral neuropathy. Skin: Denies rash, pruritis, or open  wounds.  Breast: Denies any new nodularity, masses, tenderness, nipple changes, or nipple discharge. Axillary tenderness as above. Psych: Denies depression, anxiety, insomnia, or memory loss.   A 14-point review of systems was completed and was negative, except as noted above.   ONCOLOGY TREATMENT TEAM:  1. Surgeon:  Dr. Dalbert Batman  2. Medical Oncologist: Dr. Jana Hakim 3. Radiation Oncologist: Dr. Pablo Ledger    PAST MEDICAL/SURGICAL HISTORY:  Past Medical History  Diagnosis Date  . Hypothyroidism   . Rheumatoid arthritis(714.0)   . Osteopenia   . NIDDM (non-insulin dependent diabetes mellitus)   . Multinodular thyroid   . Hypercholesterolemia   . HSV infection   . Osteoporosis 2015  . Cancer   . Wears contact lenses   . Breast cancer 08/24/14    right, upper inner  . Family history of colon cancer   . Family history of ovarian cancer   . Radiation 01/31/15-03/01/15    right  breast   Past Surgical History  Procedure Laterality Date  . Cholecystectomy  1993  . Tubal ligation  1996    re annastomosis  . Cesarean section  [id number]  . Colonoscopy    . Radioactive seed guided mastectomy with axillary sentinel lymph node biopsy Right 10/12/2014    Procedure: RIGHT  PARTIAL MASTECTOMY WITH RADIOACTIVE SEED LOCALIZATION  RIGHT  AXILLARY SENTINEL  NODE BIOPSY;  Surgeon: Fanny Skates, MD;  Location: [place];  Service: General;  Laterality: Right;  . Incision and drainage abscess Right 11/24/2014    Procedure: INCISION AND DRAINAGE RIGHT AXILLA  ABSCESS;  Surgeon: Fanny Skates, MD;  Location: [place] [facility];  Service: General;  Laterality: Right;     ALLERGIES:  Allergies  Allergen Reactions  . Shellfish Allergy Hives     CURRENT MEDICATIONS:  Current Outpatient Prescriptions on File Prior to Visit  Medication Sig Dispense Refill  . amitriptyline (ELAVIL) 25 MG tablet TAKE ONE TABLET BY MOUTH AT BEDTIME.  "OV NEEDED" 30 tablet 0  . aspirin 81 MG tablet Take 81 mg by  mouth daily.    . Calcium Carbonate-Vitamin D 600-200 MG-UNIT TABS Take 1,200 mg by mouth.    . Exenatide ER 2 MG PEN Inject 2 mg into the skin once a week. 1 each 12  . gabapentin (NEURONTIN) 300 MG capsule Take 1 capsule (300 mg total) by mouth at bedtime. 90 capsule 4  . glimepiride (AMARYL) 2 MG tablet Take 1 tablet (2 mg total) by mouth daily before breakfast. 90 tablet 1  . Glucosamine 500 MG CAPS Take 1 capsule by mouth daily.    Destiny Moody Kitchen lactobacillus acidophilus (BACID) TABS tablet Take 2 tablets by mouth 3 (three) times daily.    Destiny Moody Kitchen lisinopril (PRINIVIL,ZESTRIL) 2.5 MG tablet Take 1 tablet (2.5 mg total) by mouth daily. 90 tablet 1  . metFORMIN (GLUMETZA) 1000 MG (MOD) 24 hr tablet Take 1 tablet (1,000 mg total) by mouth 2 (two) times daily with a meal. 180 tablet 1  . Multiple Vitamin (MULTIVITAMIN) capsule Take 1 capsule by mouth daily.    . pioglitazone (ACTOS) 30 MG tablet Take 1 tablet (30 mg total) by mouth daily. 30 tablet 3  . simvastatin (ZOCOR) 20 MG tablet Take 1 tablet (20 mg total) by mouth at bedtime. 90 tablet 1  . TURMERIC PO Take 1 capsule by  mouth daily.    Destiny Moody Kitchen acetaminophen (TYLENOL) 500 MG tablet Take 1,000 mg by mouth every 6 (six) hours as needed.    Destiny Moody Kitchen alendronate (FOSAMAX) 70 MG tablet Take 70 mg by mouth once a week. Take with a full glass of water on an empty stomach.    . fluticasone (FLONASE) 50 MCG/ACT nasal spray Place 2 sprays into both nostrils daily as needed for allergies or rhinitis. (Patient not taking: Reported on 05/10/2015) 16 g 6   No current facility-administered medications on file prior to visit.     ONCOLOGIC FAMILY HISTORY:  Family History  Problem Relation Age of Onset  . Diabetes Mother     niddm  . Heart disease Mother   . Heart failure Father   . Heart disease Father   . Ovarian cancer Cousin 64  . Hyperlipidemia Sister   . Colon cancer Maternal Uncle 70     GENETIC COUNSELING/TESTING: Yes, performed 04/26/15. OvaNext panel Cephus Shelling)  shows no clinically significant variants in ATM, BARD1, BRCA1, BRCA2, BRIP1, CDH1, CHEK2, EPCAM, MLH1, MRE11A, MSH2, MSH6, MUTYH, NBN, NF1, PALB2, PMS2, PTEN, RAD50, RAD51C, RAD51D, SMARCA4, STK11, and TP53.  SOCIAL HISTORY:  Destiny Moody is married and lives with her spouse in [place], [place].  She has 2 children.  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  She denies any current or history of tobacco or illicit drug use.  She does use alcohol occasionally.   PHYSICAL EXAMINATION:  Vital Signs: Temp: 96.9, BP: 129/65, Pulse:80, Respirations: 18, Weight: 158.5 ECOG performance status: 0  General: Well-nourished, well-appearing female in no acute distress.  She is unaccompanied in clinic today.   HEENT: Head is atraumatic and normocephalic.  Pupils equal and reactive to light and accomodation. Conjunctivae clear without exudate.  Sclerae anicteric. Oral mucosa is pink, moist, and intact without lesions.  Oropharynx is pink without lesions or erythema.  Dentition fair. Lymph: No cervical, supraclavicular, infraclavicular, or axillary lymphadenopathy noted on palpation.  Cardiovascular: Regular rate and rhythm without murmurs, rubs, or gallops. Respiratory: Clear to auscultation bilaterally. Chest expansion symmetric without accessory muscle use on inspiration or expiration.  Breast: No tenderness or mass noted in the right breast; lumpectomy scar present and well-healed.  No nodularity. GI: Abdomen soft and round. No tenderness to palpation. Bowel sounds normoactive in 4 quadrants. No hepatosplenomegaly.   GU: Deferred.  Musculoskeletal: Muscle strength 5/5 in all extremities.  Full ROM noted in all extremities.  Neuro: No focal deficits. Steady gait.  Psych: Mood and affect normal and appropriate for situation.  Extremities: No edema, cyanosis, or clubbing.  Skin: Warm and dry. No open lesions noted.   LABORATORY DATA:  None for this visit.  DIAGNOSTIC IMAGING:    None for this visit.     ASSESSMENT AND PLAN:   1. History of breast cancer:  Destiny Moody will follow-up with her medical oncologist,  Dr. Jana Hakim in December 2016 with history and physical exam per surveillance protocol.  As above, she is scheduled to begin her anti-estrogen therapy with Tamoxifen on May 19, 2015 as prescribed by  Dr. Jana Hakim. She was instructed to make Dr. Jana Hakim or myself aware if she begins to experience any side effects of the medication and I could see her back in clinic to help manage those side effects, as needed. Though the incidence is low, there is an associated risk of endometrial cancer with anti-estrogen therapies like Tamoxifen.  Destiny Moody was encouraged to contact Dr. Jana Hakim or myself  with any vaginal bleeding while taking Tamoxifen. Other side effects of Tamoxifen were again reviewed with her as well. A comprehensive survivorship care plan and treatment summary was reviewed with the patient today detailing her breast cancer diagnosis, treatment course, potential late/long-term effects of treatment, appropriate follow-up care with recommendations for the future, and patient education resources.  A copy of this summary, along with a letter will be sent to the patient's primary care provider via in basket message after today's visit.  Destiny Moody is welcome to return to the Survivorship Clinic in the future, as needed; no follow-up will be scheduled at this time.    2. Bone health: Per our records, Destiny Moody last DEXA scan was November 2015  revealing osteopenia.  The Tamoxifen should not lead to a decrease in bone loss and should potentially strengthen her bones.  She will continue on her calcium and vitamin D and take part in weight bearing exercise to help strengthen her bones.  She was given education on specific activities to promote bone health.  3. Cancer screening:  Due to Destiny Moody's history and her age, she should receive screening for skin  cancers, colon cancer, and gynecologic cancers.  The information and recommendations are listed on the patient's comprehensive care plan/treatment summary and were reviewed in detail with the patient.    4. Health maintenance and wellness promotion: Destiny Moody was encouraged to consume 5-7 servings of fruits and vegetables per day. We reviewed the "Nutrition Rainbow" handout, as well as the handout about "Nutrition for Breast Cancer Survivors."  She was also encouraged to engage in moderate to vigorous exercise for 30 minutes per day most days of the week. We discussed the [employer] fitness program, which is designed for cancer survivors to help them become more physically fit after cancer treatments.  She was instructed to limit her alcohol consumption and continue to abstain from tobacco use.   5. Support services/counseling: It is not uncommon for this period of the patient's cancer care trajectory to be one of many emotions and stressors.  We discussed an opportunity for her to participate in the next session of [facility] ("Finding Your New Normal") support group series designed for patients after they have completed treatment.   Destiny Moody was encouraged to take advantage of our many other support services programs, support groups, and/or counseling in coping with her new life as a cancer survivor after completing anti-cancer treatment.  She was offered support today through active listening and expressive supportive counseling.  She was given information regarding our available services and encouraged to contact me with any questions or for help enrolling in any of our support group/programs.    A total of (50) minutes of face-to-face time was spent with this patient with greater than 50% of that time in counseling and care-coordination.   Sylvan Cheese, NP  Survivorship Program [facility] [phone]   Note: PRIMARY CARE PROVIDER Wendie Agreste,  [place] [phone]

## 2015-05-13 ENCOUNTER — Telehealth: Payer: Self-pay | Admitting: Nurse Practitioner

## 2015-05-13 NOTE — Telephone Encounter (Signed)
Called and spoke with Ms. Ranes regarding her follow up appointments with Dr. Dalbert Batman.  At her recent survivorship visit, she shared with me that her insurance changed and that Dr. Dalbert Batman is no longer in network for her and that she would have to pay a higher copay for follow up.  Therefore, she has not yet scheduled a follow up appointment with him. I reached out to Dr. Dalbert Batman to make him aware of this fact. I shared with her that Dr. Dalbert Batman and Dr. Jana Hakim would like to continue to follow her due to her need for close monitoring following completion of her therapy.  She shared that she would like to avoid this additional cost, as possible, and will continue to follow up with Dr. Jana Hakim at this time.  I assured her that if needed, we could faciliate expedited return appointments with Dr. Dalbert Batman in the future.  She is happy with this approach and will see Dr. Jana Hakim in December.

## 2015-05-20 ENCOUNTER — Encounter (HOSPITAL_COMMUNITY): Payer: Self-pay

## 2015-06-28 ENCOUNTER — Ambulatory Visit (INDEPENDENT_AMBULATORY_CARE_PROVIDER_SITE_OTHER): Payer: 59

## 2015-06-28 ENCOUNTER — Ambulatory Visit (INDEPENDENT_AMBULATORY_CARE_PROVIDER_SITE_OTHER): Payer: 59 | Admitting: Family Medicine

## 2015-06-28 VITALS — BP 122/68 | HR 80 | Temp 98.3°F | Resp 16 | Ht 62.0 in | Wt 153.8 lb

## 2015-06-28 DIAGNOSIS — R0781 Pleurodynia: Secondary | ICD-10-CM

## 2015-06-28 DIAGNOSIS — M25522 Pain in left elbow: Secondary | ICD-10-CM

## 2015-06-28 DIAGNOSIS — M533 Sacrococcygeal disorders, not elsewhere classified: Secondary | ICD-10-CM

## 2015-06-28 DIAGNOSIS — R350 Frequency of micturition: Secondary | ICD-10-CM | POA: Diagnosis not present

## 2015-06-28 DIAGNOSIS — T148 Other injury of unspecified body region: Secondary | ICD-10-CM

## 2015-06-28 DIAGNOSIS — T148XXA Other injury of unspecified body region, initial encounter: Secondary | ICD-10-CM

## 2015-06-28 DIAGNOSIS — E1149 Type 2 diabetes mellitus with other diabetic neurological complication: Secondary | ICD-10-CM

## 2015-06-28 LAB — POCT URINALYSIS DIP (MANUAL ENTRY)
Bilirubin, UA: NEGATIVE
Blood, UA: NEGATIVE
Glucose, UA: NEGATIVE
Ketones, POC UA: NEGATIVE
Leukocytes, UA: NEGATIVE
Nitrite, UA: NEGATIVE
Protein Ur, POC: NEGATIVE
Spec Grav, UA: <=1.005
Urobilinogen, UA: 0.2
pH, UA: 7.5

## 2015-06-28 LAB — POCT GLYCOSYLATED HEMOGLOBIN (HGB A1C): Hemoglobin A1C: 6.8

## 2015-06-28 LAB — POC MICROSCOPIC URINALYSIS (UMFC): Mucus: ABSENT

## 2015-06-28 LAB — HEMOGLOBIN A1C: Hgb A1c MFr Bld: 6.8 % — AB (ref 4.0–6.0)

## 2015-06-28 NOTE — Patient Instructions (Signed)
Contusin (Contusion) Una contusin es un hematoma profundo. Las contusiones son el resultado de un traumatismo cerrado en los tejidos y las fibras musculares que estn debajo de la piel. La lesin causa una hemorragia debajo de la piel. [employer] contusin puede tornarse de color [place], morado o [place]. Las lesiones menores causarn contusiones sin Social research officer, government, Armed forces training and education officer las ms graves pueden presentar dolor e inflamacin durante un par de semanas.  CAUSAS  Generalmente, esta afeccin se debe a un golpe, un traumatismo o una fuerza directa en una zona del cuerpo. SNTOMAS  Los sntomas de esta afeccin incluyen lo siguiente:  Hinchazn de la zona lesionada.  Dolor y sensibilidad en la zona de la lesin.  Cambio de color. La zona puede enrojecerse y [employer], [place] o [place]. DIAGNSTICO  Esta afeccin se diagnostica en funcin de un examen fsico y de la historia clnica. Puede ser necesario hacer una radiografa, una tomografa computarizada (TC) o una resonancia magntica (RM) para determinar si hubo lesiones asociadas, como huesos rotos (fracturas). TRATAMIENTO  El tratamiento especfico de esta afeccin depender de la zona del cuerpo donde se produjo la lesin. En general, el mejor tratamiento para una contusin es el reposo, la aplicacin de hielo, la compresin y la elevacin de la zona de la lesin. Generalmente, esto se conoce como la estrategia de RHCE. Para Financial controller, tambin pueden recomendarle antiinflamatorios de [place].  INSTRUCCIONES PARA EL CUIDADO EN EL HOGAR   Mantenga la zona de la lesin en reposo.  Si se lo indican, aplique hielo sobre la zona lesionada:  Ponga el hielo en una bolsa plstica.  Coloque una toalla entre la piel y la bolsa de hielo.  Coloque el hielo durante 97minutos, 2 a 3veces por Training and development officer.  Si se lo indican, ejerza una compresin suave en la zona de la lesin con una venda elstica. Asegrese de que la venda no est [place]. Gunnar Fusi y vuelva a colocarse la venda como se lo haya indicado el mdico.  Cuando est sentado o acostado, eleve la zona de la lesin por encima del nivel del corazn, si es posible.  Tome los medicamentos de venta libre y los recetados solamente como se lo haya indicado el mdico. SOLICITE ATENCIN MDICA SI:  Los sntomas no mejoran despus de varios das de [place].  Los sntomas empeoran.  Tiene dificultad para mover la zona lesionada. SOLICITE ATENCIN MDICA DE INMEDIATO SI:   Siente dolor intenso.  Siente adormecimiento en una mano o un pie.  La mano o el pie estn plidos o fros.   Esta informacin no tiene Marine scientist el consejo del mdico. Asegrese de hacerle al mdico cualquier pregunta que tenga.   Document Released: 06/13/2005 Document Revised: 05/25/2015 Elsevier Interactive Patient Education [employer].

## 2015-06-28 NOTE — Progress Notes (Signed)
Chief Complaint:  Chief Complaint  Patient presents with  . pain on left side    pt. fell down stairs couple weeks ago./ x 1 week  . Labs Only    pt would like to get her A1C checked.  . Urinary Frequency    x 1 week.    HPI: Destiny Moody is a 64 y.o. female who reports to [facility] today complaining of 2 week hx of left sided pain, she hit her left rib and elbow, her coccyx as well,  When she fell off escalator about 1 week ago.  She cannot sleepwell, has  left sided ribs near breast, she cannot sleep or turn or cough without pain, she is in moderate pain only with certain pressure and ROM.  She has tried over the counter ointment for this. She has had no problems with her shoudler, arm or elbow movement. She deneis any /w/t. She fell down about 3 steps .  She did hit her head and jaw but she did not have any LOC. No confusion. She was coming back from [place]. [place] [employer] [place]. She has osteoporosis  She would like to get her HbA1c.She has DM. 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 UTD on eye exam, UTD on vaccines. She has had some increase urinary frequency but not dysuria.    Lab Results  Component Value Date   HGBA1C 6.8 06/28/2015   HGBA1C 5.9 01/17/2015   Lab Results  Component Value Date   CREATININE 0.8 04/06/2015      Past Medical History  Diagnosis Date  . Hypothyroidism   . Rheumatoid arthritis(714.0)   . Osteopenia   . NIDDM (non-insulin dependent diabetes mellitus)   . Multinodular thyroid   . Hypercholesterolemia   . HSV infection   . Osteoporosis 2015  . Cancer ([place])   . Wears contact lenses   . Breast cancer ([place]) 08/24/14    right, upper inner  . Family history of colon cancer   . Family history of ovarian cancer   . Radiation 01/31/15-03/01/15    right  breast   Past Surgical History  Procedure Laterality Date  . Cholecystectomy  1993  . Tubal ligation  1996    re annastomosis  . Cesarean section  [id number]  . Colonoscopy    . Radioactive seed  guided mastectomy with axillary sentinel lymph node biopsy Right 10/12/2014    Procedure: RIGHT  PARTIAL MASTECTOMY WITH RADIOACTIVE SEED LOCALIZATION  RIGHT  AXILLARY SENTINEL  NODE BIOPSY;  Surgeon: Fanny Skates, MD;  Location: [facility];  Service: General;  Laterality: Right;  . Incision and drainage abscess Right 11/24/2014    Procedure: INCISION AND DRAINAGE RIGHT AXILLA  ABSCESS;  Surgeon: Fanny Skates, MD;  Location: [facility];  Service: General;  Laterality: Right;   Social History   Social History  . Marital Status: Married    Spouse Name: [name]  . Number of Children: 2  . Years of Education: College   Occupational History  .      Self Employed   Social History Main Topics  . Smoking status: Never Smoker   . Smokeless tobacco: Never Used  . Alcohol Use: Yes     Comment: occasionally  . Drug Use: No  . Sexual Activity: Not Asked     Comment: menarche age 62, G59 , P 2, menopause age 65, no HRT   Other Topics Concern  . None   Social History Narrative  Patient lives at home with her spouse.   Caffeine use: 1 cup daily   Family History  Problem Relation Age of Onset  . Diabetes Mother     niddm  . Heart disease Mother   . Heart failure Father   . Heart disease Father   . Ovarian cancer Cousin 61  . Hyperlipidemia Sister   . Colon cancer Maternal Uncle 70   Allergies  Allergen Reactions  . Shellfish Allergy Hives   Prior to Admission medications   Medication Sig Start Date End Date Taking? Authorizing Provider  acetaminophen (TYLENOL) 500 MG tablet Take 1,000 mg by mouth every 6 (six) hours as needed.   Yes Historical Provider, MD  alendronate (FOSAMAX) 70 MG tablet Take 70 mg by mouth once a week. Take with a full glass of water on an empty stomach.   Yes Historical Provider, MD  amitriptyline (ELAVIL) 25 MG tablet TAKE ONE TABLET BY MOUTH AT BEDTIME.  "OV NEEDED" 05/09/15  Yes Mancel Bale, PA-C  aspirin 81 MG tablet Take 81 mg by mouth  daily.   Yes Historical Provider, MD  Calcium Carbonate-Vitamin D 600-200 MG-UNIT TABS Take 1,200 mg by mouth.   Yes Historical Provider, MD  Exenatide ER 2 MG PEN Inject 2 mg into the skin once a week. 01/17/15  Yes Wendie Agreste, MD  glimepiride (AMARYL) 2 MG tablet Take 1 tablet (2 mg total) by mouth daily before breakfast. 01/17/15  Yes Wendie Agreste, MD  Glucosamine 500 MG CAPS Take 1 capsule by mouth daily.   Yes Historical Provider, MD  lactobacillus acidophilus (BACID) TABS tablet Take 2 tablets by mouth 3 (three) times daily.   Yes Historical Provider, MD  lisinopril (PRINIVIL,ZESTRIL) 2.5 MG tablet Take 1 tablet (2.5 mg total) by mouth daily. 01/17/15  Yes Wendie Agreste, MD  metFORMIN (GLUMETZA) 1000 MG (MOD) 24 hr tablet Take 1 tablet (1,000 mg total) by mouth 2 (two) times daily with a meal. 01/17/15  Yes Wendie Agreste, MD  Multiple Vitamin (MULTIVITAMIN) capsule Take 1 capsule by mouth daily.   Yes Historical Provider, MD  pioglitazone (ACTOS) 30 MG tablet Take 1 tablet (30 mg total) by mouth daily. 04/08/15  Yes Tereasa Coop, PA-C  simvastatin (ZOCOR) 20 MG tablet Take 1 tablet (20 mg total) by mouth at bedtime. 01/17/15  Yes Wendie Agreste, MD  TURMERIC PO Take 1 capsule by mouth daily.   Yes Historical Provider, MD     ROS: The patient denies fevers, chills, night sweats, unintentional weight loss, chest pain, palpitations, wheezing, dyspnea on exertion, nausea, vomiting, abdominal pain, dysuria, hematuria, melena, numbness, weakness, or tingling.   All other systems have been reviewed and were otherwise negative with the exception of those mentioned in the HPI and as above.    PHYSICAL EXAM: Filed Vitals:   06/28/15 0907  BP: 122/68  Pulse: 80  Temp: 98.3 F (36.8 C)  Resp: 16   Body mass index is 28.12 kg/(m^2).   General: Alert, no acute distress HEENT:  Normocephalic, atraumatic, oropharynx patent. EOMI, PERRLA Cardiovascular:  Regular rate and rhythm, no  rubs murmurs or gallops.  No Carotid bruits, radial pulse intact. No pedal edema.  Respiratory: Clear to auscultation bilaterally.  No wheezes, rales, or rhonchi.  No cyanosis, no use of accessory musculature Abdominal: No organomegaly, abdomen is soft and non-tender, positive bowel sounds. No masses. Skin: No rashes. Neurologic: Facial musculature symmetric. Psychiatric: Patient acts appropriately throughout our interaction. Lymphatic:  No cervical or submandibular lymphadenopathy Musculoskeletal: Gait intact. 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   LABS: Results for orders placed or performed in visit on 06/28/15  POCT glycosylated hemoglobin (Hb A1C)  Result Value Ref Range   Hemoglobin A1C 6.8   POCT urinalysis dipstick  Result Value Ref Range   Color, UA yellow yellow   Clarity, UA clear clear   Glucose, UA negative negative   Bilirubin, UA negative negative   Ketones, POC UA negative negative   Spec Grav, UA <=1.005    Blood, UA negative negative   pH, UA 7.5    Protein Ur, POC negative negative   Urobilinogen, UA 0.2    Nitrite, UA Negative Negative   Leukocytes, UA Negative Negative  POCT Microscopic Urinalysis ([facility])  Result Value Ref Range   WBC,UR,HPF,POC Few (A) None WBC/hpf   RBC,UR,HPF,POC None None RBC/hpf   Bacteria None None   Mucus Absent Absent   Epithelial Cells, UR Per Microscopy Few (A) None cells/hpf     EKG/XRAY:   Primary read interpreted [facility]. Marin [facility]. Elbow-no acute fracture or dislocation Ribs-no obvious fractures or dislocation, no pneumopthorax Sacrum-please comment if there is a coccyx angulation/fracture   ASSESSMENT/PLAN: Encounter Diagnoses  Name Primary?  . Rib pain on left side   . Coccyx pain   . Left elbow pain   . 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 Urinary frequency   . Contusion Yes   64 y/o female with PMH Breast cancer , osteoporosis, DM who presents with rib, elbow, and coccyx pain due to fall , likely contusions She declines any strong pain meds, she will take what she has  Continue with meds Fu prn    Gross sideeffects, risk and benefits, and alternatives of medications d/w patient. Patient is aware that all medications have potential sideeffects and we are unable to predict every sideeffect or drug-drug interaction that may occur.  Thao Le DO  06/28/2015 11:25 AM

## 2015-06-29 ENCOUNTER — Other Ambulatory Visit: Payer: Self-pay

## 2015-06-29 MED ORDER — METFORMIN HCL ER (MOD) 1000 MG PO TB24: 1000.0000 mg | ORAL_TABLET | Freq: Two times a day (BID) | ORAL | Status: DC

## 2015-06-30 ENCOUNTER — Telehealth: Payer: Self-pay

## 2015-06-30 MED ORDER — METFORMIN HCL ER 500 MG PO TB24: 1000.0000 mg | ORAL_TABLET | Freq: Two times a day (BID) | ORAL | Status: DC

## 2015-06-30 NOTE — Telephone Encounter (Signed)
Pharm faxed req to change the glumetza Rx to glucophage, which the pt has been taking. It looks like the last Rx we wrote is for the metformin ER 500 mg, 2 tabs twice daily which is generic and a LOT cheaper. I OKd change to this equivalent med.

## 2015-07-07 ENCOUNTER — Ambulatory Visit (HOSPITAL_BASED_OUTPATIENT_CLINIC_OR_DEPARTMENT_OTHER): Payer: 59 | Admitting: Nurse Practitioner

## 2015-07-07 ENCOUNTER — Encounter: Payer: Self-pay | Admitting: Nurse Practitioner

## 2015-07-07 VITALS — BP 128/64 | HR 87 | Temp 98.3°F | Resp 18 | Ht 62.0 in | Wt 154.7 lb

## 2015-07-07 DIAGNOSIS — Z170 Estrogen receptor positive status [ER+]: Secondary | ICD-10-CM

## 2015-07-07 DIAGNOSIS — C50211 Malignant neoplasm of upper-inner quadrant of right female breast: Secondary | ICD-10-CM

## 2015-07-07 DIAGNOSIS — Z79810 Long term (current) use of selective estrogen receptor modulators (SERMs): Secondary | ICD-10-CM

## 2015-07-07 DIAGNOSIS — R1010 Upper abdominal pain, unspecified: Secondary | ICD-10-CM

## 2015-07-07 DIAGNOSIS — M25511 Pain in right shoulder: Secondary | ICD-10-CM | POA: Diagnosis not present

## 2015-07-07 DIAGNOSIS — T148XXA Other injury of unspecified body region, initial encounter: Secondary | ICD-10-CM

## 2015-07-07 DIAGNOSIS — Z923 Personal history of irradiation: Secondary | ICD-10-CM

## 2015-07-07 DIAGNOSIS — M79621 Pain in right upper arm: Secondary | ICD-10-CM

## 2015-07-07 NOTE — Patient Instructions (Addendum)
Thank you for coming in today!  I am so sorry that you are having pain!  As we discussed, we will order an ultrasound to look at the area under your right arm to make sure that there isn't any fluid or infection.  It does not look like you have another abscess like you did earlier following your surgery.  We will try to get this scheduled as quickly as possible and we will call you with those results.  Regarding the pain under your right breast along your upper abdomen / rib, I think this is a pulled muscle.  As we discussed, try rest, ibuprofen and some ice / heat to see if it improves. If it doesn't, we will obtain an xray of your rib to make sure there is no fracture.  If the pain is getting worse, please call us asap.  Continue your tamoxifen and all your other medications and let us know how your pain is.  We will call with the ultrasound results ASAP.  Muscle Strain A muscle strain is an injury that occurs when a muscle is stretched beyond its normal length. Usually a small number of muscle fibers are torn when this happens. Muscle strain is rated in degrees. First-degree strains have the least amount of muscle fiber tearing and pain. Second-degree and third-degree strains have increasingly more tearing and pain.  Usually, recovery from muscle strain takes 1-2 weeks. Complete healing takes 5-6 weeks.  CAUSES  Muscle strain happens when a sudden, violent force placed on a muscle stretches it too far. This may occur with lifting, sports, or a fall.  RISK FACTORS Muscle strain is especially common in athletes.  SIGNS AND SYMPTOMS At the site of the muscle strain, there may be:  Pain.  Bruising.  Swelling.  Difficulty using the muscle due to pain or lack of normal function. DIAGNOSIS  Your health care provider will perform a physical exam and ask about your medical history. TREATMENT  Often, the best treatment for a muscle strain is resting, icing, and applying cold compresses to the  injured area.  HOME CARE INSTRUCTIONS   Use the PRICE method of treatment to promote muscle healing during the first 2-3 days after your injury. The PRICE method involves:  Protecting the muscle from being injured again.  Restricting your activity and resting the injured body part.  Icing your injury. To do this, put ice in a plastic bag. Place a towel between your skin and the bag. Then, apply the ice and leave it on from 15-20 minutes each hour. After the third day, switch to moist heat packs.  Apply compression to the injured area with a splint or elastic bandage. Be careful not to wrap it too tightly. This may interfere with blood circulation or increase swelling.  Elevate the injured body part above the level of your heart as often as you can.  Only take over-the-counter or prescription medicines for pain, discomfort, or fever as directed by your health care provider.  Warming up prior to exercise helps to prevent future muscle strains. SEEK MEDICAL CARE IF:   You have increasing pain or swelling in the injured area.  You have numbness, tingling, or a significant loss of strength in the injured area. MAKE SURE YOU:   Understand these instructions.  Will watch your condition.  Will get help right away if you are not doing well or get worse.   This information is not intended to replace advice given to you by your  health care provider. Make sure you discuss any questions you have with your health care provider.   Document Released: 09/03/2005 Document Revised: 06/24/2013 Document Reviewed: 04/02/2013 Elsevier Interactive Patient Education [employer].

## 2015-07-07 NOTE — Progress Notes (Signed)
CLINIC:  Cancer Survivorship   REASON FOR VISIT:  Routine follow-up post-treatment for history of breast cancer.  BRIEF ONCOLOGIC HISTORY:    Breast cancer of upper-inner quadrant of right female breast ([place])   06/11/2014 Mammogram Diagnostic mammogram: Asymmetric density in the medial aspect of the right breast associated with mild distortion. No discrete mass or suspicious microcalcifications identified. No sonographic correlate.   06/15/2014 Initial Biopsy Right breast needle core bx: multiple benign areas of fibrovascular and adipose soft tissue; no evidence of malignancy.  Discordant with imaging.  Plan for MRI in 6-8 weeks post biopsy. Small hematoma formation following biopsy necessitating drainage.   08/04/2014 Breast MRI Post biopsy changes in the medial right breast at posterior depth. A 1.7 cm AP diameter area of linear clumped non mass enhancement is noted superior and lateral to the biopsy site representing good MR correlate to the previous mammo finding.   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 DCIS present.   10/12/2014 Definitive Surgery Right breast lumpectomy with SLNB Dalbert Batman): IDC with DCIS. Grade 1. Repeat HER2/NEU negative (ratio 1.13).  Right axillary LN negative for tumor.  Negative margins.   10/12/2014 Pathologic Stage Stage IA: T1c N0   10/12/2014 Clinical Stage Stage IA (T1cN0)   10/12/2014 Oncotype testing Recurrence score 14 (9% ROR). No chemo (Magrinat).   11/24/2014 Surgery Right axillary abscess incision and drainage.  RT delayed to allow for healing.   01/31/2015 - 03/01/2015 Radiation Therapy Adjuvant RT completed Pablo Ledger): Right breast 42.72 Gy over 21 fractions; right breast boost 10 Gy over 5 fractions.  Total dose: 52.72 Gy.   04/26/2015 Procedure Genetic testing: OvaNext panel Cephus Shelling) shows no clinically significant variants in ATM, BARD1, BRCA1,  BRCA2, BRIP1, CDH1, CHEK2, EPCAM, MLH1, MRE11A, MSH2, MSH6, MUTYH, NBN, NF1, PALB2, PMS2, PTEN, RAD50, RAD51C, RAD51D, SMARCA4, STK11, and TP53.    Anti-estrogen oral therapy Plan to begin Tamoxifen 20 mg daily May 19, 2015 ([facility]rinat) to allow recovery from RT.  Plan for 2 years with consideration of change to AI at that time.   05/10/2015 Survivorship Survivorship visit completed and copy of survivorship care plan provided to patient.     INTERVAL HISTORY:  Destiny Moody presents to the Survivorship Clinic today for evaluation of right axillary and chest / upper abdomen pain. Destiny Moody has experienced tenderness in her right axilla since surgery in January 2016 when she underwent a lumpectomy and sentinel lymph node biopsy for a Stage IA (T1cN0) ER/PR+, HER2/neu negative invasive ductal carcinoma.  She was followed by surgery for a small hematoma within the right axilla which was treated with antibiotics and required surgical aspiration in March 2016. This delayed her initiation of radiation therapy which she eventually went on to complete in June 2016.  When seen for her survivorship care plan review in August 2016, she still had some mild tenderness but felt it was improving.  Since that time, it has continued and she felt feels as if it is slightly worse today. She denies any fever or chills. She denies any palpable mass. Denies any injury to the area.  Destiny Moody also reports the development of some right upper abdomen/rib [facility] with the [facility] program and states she was doing some upper abdominal crunches when she felt a sharp pain in her upper abdomen.  The pain has continued since that time and  she describes it as stabbing. It is not relieved by repositioning and she has not tried any intervention (pain medication / ice or heat) for it. Based on this and her axillary pain, sought medical attention today.  She states that the pain  does move along to her side. She denies any nausea, vomiting or difficulty breathing. Of note, she is taking her tamoxifen and tolerating it well.    REVIEW OF SYSTEMS:  General: Denies unintentional weight loss, night sweats, or generalized fatigue.  HEENT: Denies visual changes, hearing loss, mouth sores, or difficulty swallowing. Cardiac: Denies palpitations, chest pain, and lower extremity edema.  Respiratory: Denies cough, shortness of breath, and dyspnea on exertion.  Breast: Denies any new nodularity, masses, tenderness, nipple changes, or nipple discharge.  GI: She does have heartburn and occasional burping, otherwise denies pain, constipation, diarrhea, nausea, or vomiting.  GU: Denies dysuria, hematuria, vaginal bleeding, vaginal discharge, or vaginal dryness.  Musculoskeletal: Denies joint or bone pain.  Neuro: Denies headache or recent falls. Denies peripheral neuropathy. Skin: Mild rash over neck / upper chest bilaterally.  Denies pruritis, or open wounds.  Psych: Denies depression, anxiety, insomnia, or memory loss.   A 14-point review of systems was completed and was negative, except as noted above.   PAST MEDICAL/SURGICAL HISTORY:  Past Medical History  Diagnosis Date  . Hypothyroidism   . Rheumatoid arthritis(714.0)   . Osteopenia   . NIDDM (non-insulin dependent diabetes mellitus)   . Multinodular thyroid   . Hypercholesterolemia   . HSV infection   . Osteoporosis 2015  . Cancer ([facility])   . Wears contact lenses   . Breast cancer ([place]) 08/24/14    right, upper inner  . Family history of colon cancer   . Family history of ovarian cancer   . Radiation 01/31/15-03/01/15    right  breast   Past Surgical History  Procedure Laterality Date  . Cholecystectomy  1993  . Tubal ligation  1996    re annastomosis  . Cesarean section  [id number]  . Colonoscopy    . Radioactive seed guided mastectomy with axillary sentinel lymph node biopsy Right 10/12/2014    Procedure:  RIGHT  PARTIAL MASTECTOMY WITH RADIOACTIVE SEED LOCALIZATION  RIGHT  AXILLARY SENTINEL  NODE BIOPSY;  Surgeon: Fanny Skates, MD;  Location: [facility];  Service: General;  Laterality: Right;  . Incision and drainage abscess Right 11/24/2014    Procedure: INCISION AND DRAINAGE RIGHT AXILLA  ABSCESS;  Surgeon: Fanny Skates, MD;  Location: [facility];  Service: General;  Laterality: Right;     ALLERGIES:  Allergies  Allergen Reactions  . Shellfish Allergy Hives     CURRENT MEDICATIONS:  Current Outpatient Prescriptions on File Prior to Visit  Medication Sig Dispense Refill  . acetaminophen (TYLENOL) 500 MG tablet Take 1,000 mg by mouth every 6 (six) hours as needed.    Marland Kitchen alendronate (FOSAMAX) 70 MG tablet Take 70 mg by mouth once a week. Take with a full glass of water on an empty stomach.    Marland Kitchen amitriptyline (ELAVIL) 25 MG tablet TAKE ONE TABLET BY MOUTH AT BEDTIME.  "OV NEEDED" 30 tablet 0  . aspirin 81 MG tablet Take 81 mg by mouth daily.    . Calcium Carbonate-Vitamin D 600-200 MG-UNIT TABS Take 1,200 mg by mouth.    . Exenatide ER 2 MG PEN Inject 2 mg into the skin once a week. 1 each 12  . glimepiride (AMARYL) 2 MG tablet Take 1 tablet (  2 mg total) by mouth daily before breakfast. 90 tablet 1  . Glucosamine 500 MG CAPS Take 1 capsule by mouth daily.    Marland Kitchen lactobacillus acidophilus (BACID) TABS tablet Take 2 tablets by mouth 3 (three) times daily.    Marland Kitchen lisinopril (PRINIVIL,ZESTRIL) 2.5 MG tablet Take 1 tablet (2.5 mg total) by mouth daily. 90 tablet 1  . metFORMIN (GLUCOPHAGE-XR) 500 MG 24 hr tablet Take 2 tablets (1,000 mg total) by mouth 2 (two) times daily. 360 tablet 0  . Multiple Vitamin (MULTIVITAMIN) capsule Take 1 capsule by mouth daily.    . pioglitazone (ACTOS) 30 MG tablet Take 1 tablet (30 mg total) by mouth daily. 30 tablet 3  . simvastatin (ZOCOR) 20 MG tablet Take 1 tablet (20 mg total) by mouth at bedtime. 90 tablet 1  . TURMERIC PO Take 1 capsule by mouth  daily.     No current facility-administered medications on file prior to visit.     ONCOLOGIC FAMILY HISTORY:  Family History  Problem Relation Age of Onset  . Diabetes Mother     niddm  . Heart disease Mother   . Heart failure Father   . Heart disease Father   . Ovarian cancer Cousin 108  . Hyperlipidemia Sister   . Colon cancer Maternal Uncle 85    SOCIAL HISTORY:  Destiny Moody is married and lives with her spouse in [place], [place]. She has 2 children. 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 She denies any current or history of tobacco or illicit drug use. She does use alcohol occasionally.   PHYSICAL EXAMINATION:  Vital Signs:   Filed Vitals:   07/07/15 1117  BP: 128/64  Pulse: 87  Temp: 98.3 F (36.8 C)  Resp: 18   ECOG performance status: 0 General: Well-nourished, well-appearing female in mild distress when moving on examination table secondary to pain.  She is unaccompanied in clinic today.   HEENT: Head is atraumatic and normocephalic.  Sclerae anicteric. Oral mucosa is pink, moist, and intact without lesions.   Lymph: No axillary lymphadenopathy noted on palpation bilaterally. Pain to palpation along right axilla.  No mass noted.  Slight thickening over surgical scar without nodularity. Cardiovascular: Regular rate and rhythm without murmurs, rubs, or gallops. Respiratory: Clear to auscultation bilaterally. Chest expansion symmetric without accessory muscle use on inspiration or expiration.  GI: Abdomen soft and round. Tenderness to palpation along right upper abdomen over ribs.  No mass or nodule.  GU: Deferred.  Musculoskeletal: Muscle strength 5/5 in upper extremities. Neuro: No focal deficits. Steady gait.  Psych: Mood and affect normal and appropriate for situation.  Extremities: No edema, cyanosis, or clubbing.  Skin: Warm and dry. No open lesions noted. Slight irritated area along neck without lesion or blister.    LABORATORY DATA:  Recent Results (from the past 2160 hour(s))  POCT glycosylated hemoglobin (Hb A1C)     Status: None   Collection Time: 06/28/15  9:57 AM  Result Value Ref Range   Hemoglobin A1C 6.8   POCT urinalysis dipstick     Status: None   Collection Time: 06/28/15 10:12 AM  Result Value Ref Range   Color, UA yellow yellow   Clarity, UA clear clear   Glucose, UA negative negative   Bilirubin, UA negative negative   Ketones, POC UA negative negative   Spec Grav, UA <=1.005    Blood, UA negative negative   pH, UA 7.5    Protein Ur, POC negative negative  Urobilinogen, UA 0.2    Nitrite, UA Negative Negative   Leukocytes, UA Negative Negative  POCT Microscopic Urinalysis ([facility])     Status: Abnormal   Collection Time: 06/28/15 10:12 AM  Result Value Ref Range   WBC,UR,HPF,POC Few (A) None WBC/hpf   RBC,UR,HPF,POC None None RBC/hpf   Bacteria None None   Mucus Absent Absent   Epithelial Cells, UR Per Microscopy Few (A) None cells/hpf     ASSESSMENT AND PLAN:   1. Pain: History of stage IA invasive ductal carcinoma of the right breast (T1cN0) ER/PR+, HER2/neu negative, S/P lumpectomy and radiation, now with increased pain in right axilla and new pain in right upper abdomen / rib.  I think that Destiny Moody's axillary pain is most likely posttreatment in nature. I do not appreciate any mass or nodularity suggestive of seroma or abscess. We'll evaluate this with a right axillary ultrasound as soon as possible. Further disposition based on these findings. Regarding her complaints of upper abdominal/right rib pain, I believe that this is most likely muscle strain. We discussed management including rest, ice/heat, and PRN ibuprofen over the next few days to see if this improves. If it does not, we will check a rib film to ensure that she does not have a fracture. There was no physical "strike" to this area, so my suspicion of fracture is low. I do not believe either of these  complaints are related to a recurrence of her cancer. We'll continue to monitor this closely and await findings from her ultrasound.  She will continue on her tamoxifen and keep her follow up appointments as scheduled.      Sylvan Cheese, NP  Survivorship Program [facility] [phone]   Note: PRIMARY CARE PROVIDER Wendie Agreste, [place] [phone]

## 2015-07-08 ENCOUNTER — Other Ambulatory Visit: Payer: Self-pay | Admitting: Physician Assistant

## 2015-07-08 ENCOUNTER — Other Ambulatory Visit: Payer: Self-pay | Admitting: Family Medicine

## 2015-07-09 ENCOUNTER — Ambulatory Visit (INDEPENDENT_AMBULATORY_CARE_PROVIDER_SITE_OTHER): Payer: 59

## 2015-07-09 ENCOUNTER — Ambulatory Visit (INDEPENDENT_AMBULATORY_CARE_PROVIDER_SITE_OTHER): Payer: 59 | Admitting: Emergency Medicine

## 2015-07-09 VITALS — BP 122/68 | HR 87 | Temp 98.6°F | Resp 18 | Wt 156.0 lb

## 2015-07-09 DIAGNOSIS — R0789 Other chest pain: Secondary | ICD-10-CM | POA: Diagnosis not present

## 2015-07-09 MED ORDER — NAPROXEN SODIUM 550 MG PO TABS: 550.0000 mg | ORAL_TABLET | Freq: Two times a day (BID) | ORAL | Status: DC

## 2015-07-09 MED ORDER — HYDROCODONE-ACETAMINOPHEN 5-325 MG PO TABS: 1.0000 | ORAL_TABLET | ORAL | Status: DC | PRN

## 2015-07-09 NOTE — Patient Instructions (Signed)
Chest Contusion A chest contusion is a deep bruise on your chest area. Contusions are the result of an injury that caused bleeding under the skin. A chest contusion may involve bruising of the skin, muscles, or ribs. The contusion may turn blue, purple, or yellow. Minor injuries will give you a painless contusion, but more severe contusions may stay painful and swollen for a few weeks. CAUSES  A contusion is usually caused by a blow, trauma, or direct force to an area of the body. SYMPTOMS   Swelling and redness of the injured area.  Discoloration of the injured area.  Tenderness and soreness of the injured area.  Pain. DIAGNOSIS  The diagnosis can be made by taking a history and performing a physical exam. An X-ray, CT scan, or MRI may be needed to determine if there were any associated injuries, such as broken bones (fractures) or internal injuries. TREATMENT  Often, the best treatment for a chest contusion is resting, icing, and applying cold compresses to the injured area. Deep breathing exercises may be recommended to reduce the risk of pneumonia. Over-the-counter medicines may also be recommended for pain control. HOME CARE INSTRUCTIONS   Put ice on the injured area.  Put ice in a plastic bag.  Place a towel between your skin and the bag.  Leave the ice on for 15-20 minutes, 03-04 times a day.  Only take over-the-counter or prescription medicines as directed by your caregiver. Your caregiver may recommend avoiding anti-inflammatory medicines (aspirin, ibuprofen, and naproxen) for 48 hours because these medicines may increase bruising.  Rest the injured area.  Perform deep-breathing exercises as directed by your caregiver.  Stop smoking if you smoke.  Do not lift objects over 5 pounds (2.3 kg) for 3 days or longer if recommended by your caregiver. SEEK IMMEDIATE MEDICAL CARE IF:   You have increased bruising or swelling.  You have pain that is getting worse.  You have  difficulty breathing.  You have dizziness, weakness, or fainting.  You have blood in your urine or stool.  You cough up or vomit blood.  Your swelling or pain is not relieved with medicines. MAKE SURE YOU:   Understand these instructions.  Will watch your condition.  Will get help right away if you are not doing well or get worse.   This information is not intended to replace advice given to you by your health care provider. Make sure you discuss any questions you have with your health care provider.   Document Released: 05/29/2001 Document Revised: 05/28/2012 Document Reviewed: 02/25/2012 Elsevier Interactive Patient Education 2016 Elsevier Inc.  

## 2015-07-09 NOTE — Progress Notes (Signed)
Subjective:  Patient ID: Destiny Moody, female    DOB: [birth year]  Age: 64 y.o. MRN: [id number]  CC: Rib Injury; Chest Pain; and Shortness of Breath   HPI Destiny Moody presents  with pain in her right anterior chest wall at the sternal border. She was laying on a piece of gym machinery and felt a pop in her chest. She this occurred a week to Wednesday she has been experiencing severe pain is pleuritic in nature in a very well localized spot on her right sternal border. She has no shortness of breath cough wheezing or production or hemoptysis. She has no nausea vomiting or stool change. She has marked pain with lifting  History Destiny Moody has a past medical history of Hypothyroidism; Rheumatoid arthritis(714.0); Osteopenia; NIDDM (non-insulin dependent diabetes mellitus); Multinodular thyroid; Hypercholesterolemia; HSV infection; Osteoporosis (2015); Cancer ([place]); Wears contact lenses; Breast cancer ([place]) (08/24/14); Family history of colon cancer; Family history of ovarian cancer; and Radiation (01/31/15-03/01/15).   She has past surgical history that includes Cholecystectomy (1993); Tubal ligation (1996); Cesarean section (6759,1638); Colonoscopy; Radioactive seed guided mastectomy with axillary sentinel lymph node biopsy (Right, 10/12/2014); and Incision and drainage abscess (Right, 11/24/2014).   Her  family history includes Colon cancer (age of onset: 63) in her maternal uncle; Diabetes in her mother; Heart disease in her father and mother; Heart failure in her father; Hyperlipidemia in her sister; Ovarian cancer (age of onset: 59) in her cousin.  She   reports that she has never smoked. She has never used smokeless tobacco. She reports that she drinks alcohol. She reports that she does not use illicit drugs.  Outpatient Prescriptions Prior to Visit  Medication Sig Dispense Refill  . alendronate (FOSAMAX) 70 MG tablet Take 70 mg by mouth once a week. Take with a full glass of water on an empty  stomach.    Marland Kitchen amitriptyline (ELAVIL) 25 MG tablet TAKE ONE TABLET BY MOUTH AT BEDTIME.  "OV NEEDED" 30 tablet 0  . aspirin 81 MG tablet Take 81 mg by mouth daily.    . Calcium Carbonate-Vitamin D 600-200 MG-UNIT TABS Take 1,200 mg by mouth.    . Exenatide ER 2 MG PEN Inject 2 mg into the skin once a week. 1 each 12  . glimepiride (AMARYL) 2 MG tablet Take 1 tablet (2 mg total) by mouth daily before breakfast. 90 tablet 1  . Glucosamine 500 MG CAPS Take 1 capsule by mouth daily.    Marland Kitchen lactobacillus acidophilus (BACID) TABS tablet Take 2 tablets by mouth 3 (three) times daily.    Marland Kitchen lisinopril (PRINIVIL,ZESTRIL) 2.5 MG tablet Take 1 tablet (2.5 mg total) by mouth daily. 90 tablet 1  . metFORMIN (GLUCOPHAGE-XR) 500 MG 24 hr tablet Take 2 tablets (1,000 mg total) by mouth 2 (two) times daily. 360 tablet 0  . Multiple Vitamin (MULTIVITAMIN) capsule Take 1 capsule by mouth daily.    . pioglitazone (ACTOS) 30 MG tablet Take 1 tablet (30 mg total) by mouth daily. 30 tablet 3  . simvastatin (ZOCOR) 20 MG tablet Take 1 tablet (20 mg total) by mouth at bedtime. 90 tablet 1  . TURMERIC PO Take 1 capsule by mouth daily.    Marland Kitchen acetaminophen (TYLENOL) 500 MG tablet Take 1,000 mg by mouth every 6 (six) hours as needed.     No facility-administered medications prior to visit.    Social History   Social History  . Marital Status: Married    Spouse Name: [name]  . Number of  Children: 2  . Years of Education: College   Occupational History  .      Self Employed   Social History Main Topics  . Smoking status: Never Smoker   . Smokeless tobacco: Never Used  . Alcohol Use: Yes     Comment: occasionally  . Drug Use: No  . Sexual Activity: Not Asked     Comment: menarche age 38, G53 , P 2, menopause age 47, no HRT   Other Topics Concern  . None   Social History Narrative   Patient lives at home with her spouse.   Caffeine use: 1 cup daily     Review of Systems  Constitutional: Negative for  fever, chills and appetite change.  HENT: Negative for congestion, ear pain, postnasal drip, sinus pressure and sore throat.   Eyes: Negative for pain and redness.  Respiratory: Negative for cough, shortness of breath and wheezing.   Cardiovascular: Negative for leg swelling.  Gastrointestinal: Negative for nausea, vomiting, abdominal pain, diarrhea, constipation and blood in stool.  Endocrine: Negative for polyuria.  Genitourinary: Negative for dysuria, urgency, frequency and flank pain.  Musculoskeletal: Negative for gait problem.  Skin: Negative for rash.  Neurological: Negative for weakness and headaches.  Psychiatric/Behavioral: Negative for confusion and decreased concentration. The patient is not nervous/anxious.     Objective:  BP 122/68 mmHg  Pulse 87  Temp(Src) 98.6 F (37 C) (Oral)  Resp 18  Wt 156 lb (70.761 kg)  SpO2 96%  Physical Exam  Constitutional: She is oriented to person, place, and time. She appears well-developed and well-nourished.  HENT:  Head: Normocephalic and atraumatic.  Eyes: Conjunctivae are normal. Pupils are equal, round, and reactive to light.  Pulmonary/Chest: Effort normal. She exhibits bony tenderness (She has very well localized point tenderness on the fifth sternocostal junction.).  Musculoskeletal: She exhibits no edema.  Neurological: She is alert and oriented to person, place, and time.  Skin: Skin is dry.  Psychiatric: She has a normal mood and affect. Her behavior is normal. Thought content normal.      Assessment & Plan:   Destiny Moody was seen today for rib injury, chest pain and shortness of breath.  Diagnoses and all orders for this visit:  Chest wall pain -     DG Chest 2 View; Future  Other orders -     naproxen sodium (ANAPROX DS) 550 MG tablet; Take 1 tablet (550 mg total) by mouth 2 (two) times daily with a meal. -     HYDROcodone-acetaminophen (NORCO) 5-325 MG tablet; Take 1-2 tablets by mouth every 4 (four) hours as  needed.   I have discontinued Destiny Moody's acetaminophen. I am also having her start on naproxen sodium and HYDROcodone-acetaminophen. Additionally, I am having her maintain her aspirin, Calcium Carbonate-Vitamin D, TURMERIC PO, Glucosamine, lactobacillus acidophilus, multivitamin, simvastatin, lisinopril, Exenatide ER, glimepiride, alendronate, pioglitazone, amitriptyline, metFORMIN, and ibuprofen.  Meds ordered this encounter  Medications  . ibuprofen (ADVIL,MOTRIN) 200 MG tablet    Sig: Take 200 mg by mouth every 6 (six) hours as needed.  . naproxen sodium (ANAPROX DS) 550 MG tablet    Sig: Take 1 tablet (550 mg total) by mouth 2 (two) times daily with a meal.    Dispense:  40 tablet    Refill:  0  . HYDROcodone-acetaminophen (NORCO) 5-325 MG tablet    Sig: Take 1-2 tablets by mouth every 4 (four) hours as needed.    Dispense:  30 tablet    Refill:  0    Appropriate red flag conditions were discussed with the patient as well as actions that should be taken.  Patient expressed his understanding.  Follow-up: Return if symptoms worsen or fail to improve.  Roselee Culver, MD   [facility] reading (PRIMARY) by  Dr. Ouida Sills.  Surgical clips.  No pneumo or hemothorax.

## 2015-07-15 ENCOUNTER — Encounter: Payer: Self-pay | Admitting: Family Medicine

## 2015-07-18 ENCOUNTER — Ambulatory Visit: Payer: 59 | Admitting: Family Medicine

## 2015-07-19 ENCOUNTER — Telehealth: Payer: Self-pay | Admitting: Nurse Practitioner

## 2015-07-19 NOTE — Telephone Encounter (Signed)
Called to check on status of pt's pain and scheduling of ultrasound.  Pt reports axillary pain is still problematic - "sometimes worse, sometimes better." Pain at right upper abdomen has improved - saw her PCP over weekend for worsening pain and plain film showed no evidence of fracture.  Still has not heard from scheduling regarding axillary ultrasound.  Will facilitate that referral and have patient scheduled ASAP. Advised patient to expect call back soon to schedule the ultrasound.

## 2015-07-20 ENCOUNTER — Other Ambulatory Visit: Payer: Self-pay | Admitting: Nurse Practitioner

## 2015-07-20 ENCOUNTER — Other Ambulatory Visit: Payer: Self-pay | Admitting: *Deleted

## 2015-07-20 DIAGNOSIS — C50211 Malignant neoplasm of upper-inner quadrant of right female breast: Secondary | ICD-10-CM

## 2015-07-20 DIAGNOSIS — M79621 Pain in right upper arm: Secondary | ICD-10-CM

## 2015-07-27 ENCOUNTER — Ambulatory Visit
Admission: RE | Admit: 2015-07-27 | Discharge: 2015-07-27 | Disposition: A | Discharge status: Home / Self Care | Payer: 59 | Source: Ambulatory Visit | Attending: Nurse Practitioner | Admitting: Nurse Practitioner

## 2015-07-27 ENCOUNTER — Other Ambulatory Visit: Payer: Self-pay | Admitting: Nurse Practitioner

## 2015-07-27 ENCOUNTER — Other Ambulatory Visit: Payer: Self-pay | Admitting: Gynecology

## 2015-07-27 ENCOUNTER — Telehealth: Payer: Self-pay | Admitting: Adult Health

## 2015-07-27 ENCOUNTER — Telehealth: Payer: Self-pay | Admitting: *Deleted

## 2015-07-27 DIAGNOSIS — Z9889 Other specified postprocedural states: Secondary | ICD-10-CM

## 2015-07-27 DIAGNOSIS — M79621 Pain in right upper arm: Secondary | ICD-10-CM

## 2015-07-27 DIAGNOSIS — C50211 Malignant neoplasm of upper-inner quadrant of right female breast: Secondary | ICD-10-CM

## 2015-07-27 NOTE — Telephone Encounter (Signed)
I left a voicemail for Ms. Bhatnagar to follow-up regarding her recent ultrasound.  The results are benign, which is reassuring, but I was trying to reach the patient to ensure she did not have any additional questions or concerns. I encouraged her to return my call with any and all questions in Heather's absence this week. I left my direct office number for her to call back. Awaiting return call, if appropriate.   Mike Craze, NP [place] [phone]

## 2015-07-27 NOTE — Telephone Encounter (Signed)
Candice from breast center called asking if okay for pt to have bilateral mammogram and possible ultrasound, history breast cancer. oncologist out of town this week, pt was at breast center today for [place]. Mammogram. verbal given okay to do imaging.

## 2015-08-10 ENCOUNTER — Telehealth: Payer: Self-pay | Admitting: Nurse Practitioner

## 2015-08-10 NOTE — Telephone Encounter (Signed)
Received phone message to Survivorship requesting return call.  Pt had ultrasound of right axilla last week to evaluate continued right axillary pain.  Imaging negative.  Results called to patient per Mike Craze, NP. Called patient today and she is continuing with pain at right axilla, but it is no worse.  She questions whether it may be related to overuse due to exercise. Pt with follow up appointment to see Dr. Jana Hakim on 08/22/2015.  Will reduce exercise of right arm prior to that time to see if it impacts pain. Will call if pain worsens prior to appointment.  No additional questions at this time.

## 2015-08-15 ENCOUNTER — Other Ambulatory Visit: Payer: Self-pay | Admitting: Family Medicine

## 2015-08-15 ENCOUNTER — Other Ambulatory Visit (HOSPITAL_BASED_OUTPATIENT_CLINIC_OR_DEPARTMENT_OTHER): Payer: 59

## 2015-08-15 DIAGNOSIS — C50211 Malignant neoplasm of upper-inner quadrant of right female breast: Secondary | ICD-10-CM

## 2015-08-15 LAB — COMPREHENSIVE METABOLIC PANEL (CC13)
ALBUMIN: 3.7 g/dL (ref 3.5–5.0)
ALT: 35 U/L (ref 0–55)
AST: 25 U/L (ref 5–34)
Alkaline Phosphatase: 67 U/L (ref 40–150)
Anion Gap: 9 mEq/L (ref 3–11)
BUN: 19.2 mg/dL (ref 7.0–26.0)
CALCIUM: 9.2 mg/dL (ref 8.4–10.4)
CO2: 22 mEq/L (ref 22–29)
CREATININE: 0.8 mg/dL (ref 0.6–1.1)
Chloride: 105 mEq/L (ref 98–109)
EGFR: 75 mL/min/{1.73_m2} — ABNORMAL LOW (ref >90)
Glucose: 219 mg/dl — ABNORMAL HIGH (ref 70–140)
Potassium: 4.2 mEq/L (ref 3.5–5.1)
Sodium: 137 mEq/L (ref 136–145)
Total Bilirubin: 0.43 mg/dL (ref 0.20–1.20)
Total Protein: 7.4 g/dL (ref 6.4–8.3)

## 2015-08-15 LAB — CBC WITH DIFFERENTIAL/PLATELET
BASO%: 0.1 % (ref 0.0–2.0)
BASOS ABS: 0 10*3/uL (ref 0.0–0.1)
EOS ABS: 0.1 10*3/uL (ref 0.0–0.5)
EOS%: 1.9 % (ref 0.0–7.0)
HEMATOCRIT: 38.4 % (ref 34.8–46.6)
HEMOGLOBIN: 12.9 g/dL (ref 11.6–15.9)
LYMPH%: 24.5 % (ref 14.0–49.7)
MCH: 30.9 pg (ref 25.1–34.0)
MCHC: 33.6 g/dL (ref 31.5–36.0)
MCV: 91.9 fL (ref 79.5–101.0)
MONO#: 0.5 10*3/uL (ref 0.1–0.9)
MONO%: 6.6 % (ref 0.0–14.0)
NEUT#: 5.1 10*3/uL (ref 1.5–6.5)
NEUT%: 66.9 % (ref 38.4–76.8)
PLATELETS: 219 10*3/uL (ref 145–400)
RBC: 4.18 10*6/uL (ref 3.70–5.45)
RDW: 12.9 % (ref 11.2–14.5)
WBC: 7.6 10*3/uL (ref 3.9–10.3)
lymph#: 1.9 10*3/uL (ref 0.9–3.3)

## 2015-08-16 ENCOUNTER — Telehealth: Payer: Self-pay | Admitting: Nurse Practitioner

## 2015-08-16 ENCOUNTER — Other Ambulatory Visit: Payer: Self-pay | Admitting: Hematology

## 2015-08-17 NOTE — Telephone Encounter (Signed)
Opened in error

## 2015-08-21 ENCOUNTER — Other Ambulatory Visit: Payer: Self-pay | Admitting: Oncology

## 2015-08-21 DIAGNOSIS — C50411 Malignant neoplasm of upper-outer quadrant of right female breast: Secondary | ICD-10-CM

## 2015-08-22 ENCOUNTER — Telehealth: Payer: Self-pay | Admitting: Oncology

## 2015-08-22 ENCOUNTER — Ambulatory Visit (HOSPITAL_BASED_OUTPATIENT_CLINIC_OR_DEPARTMENT_OTHER): Payer: 59 | Admitting: Oncology

## 2015-08-22 VITALS — BP 158/59 | HR 79 | Temp 98.0°F | Resp 18 | Ht 62.0 in | Wt 155.2 lb

## 2015-08-22 DIAGNOSIS — Z79810 Long term (current) use of selective estrogen receptor modulators (SERMs): Secondary | ICD-10-CM

## 2015-08-22 DIAGNOSIS — C50211 Malignant neoplasm of upper-inner quadrant of right female breast: Secondary | ICD-10-CM

## 2015-08-22 DIAGNOSIS — E119 Type 2 diabetes mellitus without complications: Secondary | ICD-10-CM

## 2015-08-22 DIAGNOSIS — Z170 Estrogen receptor positive status [ER+]: Secondary | ICD-10-CM | POA: Diagnosis not present

## 2015-08-22 MED ORDER — TAMOXIFEN CITRATE 20 MG PO TABS: 20.0000 mg | ORAL_TABLET | Freq: Every day | ORAL | Status: DC

## 2015-08-22 NOTE — Telephone Encounter (Signed)
Appointments made and avs printed for patient °

## 2015-08-22 NOTE — Progress Notes (Signed)
[place]  [phone] [phone]) [phone]     ID: Destiny Moody DOB: 62-09-1950  MR#: [id number]  YFV#:[id number]  Patient Care Team: Wendie Agreste, MD as PCP - General (Family Medicine) Terrance Mass, MD as Consulting Physician (Gynecology) Fanny Skates, MD as Consulting Physician (General Surgery) Thea Silversmith, MD as Consulting Physician (Radiation Oncology) Holley Bouche, NP as Nurse Practitioner (Nurse Practitioner) Chauncey Cruel, MD as Consulting Physician (Oncology) Sylvan Cheese, NP as Nurse Practitioner (Nurse Practitioner) PCP: Wendie Agreste, MD OTHER MD:  CHIEF COMPLAINT: Estrogen receptor positive breast cancer  CURRENT TREATMENT:  tamoxifen   BREAST CANCER HISTORY: From Dr. Ernestina Penna intake note dated 01/17/2015:  "She had abnormal screening mammogram in September 2015, underwent biopsy on 06/15/2014 which was negative for malignancy. She developed I small hematoma after biopsy, which required drainage afterwards. She finally had a bilateral breast MRI , which showed a 1.7 cm non-mass enhancement in the medial right breast. She had repeated mammogram on 08/19/2014 which showed again non-mass like enhancement in the right breast which was biopsied before. 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 She tolerates the biopsy well without any complications "  Her subsequent history is as detailed below.  INTERVAL HISTORY: "Destiny Moody" returns today for follow-up of her estrogen receptor positive breast cancer. Since her last visit here she started tamoxifen. She has had a very mild vaginal discharge, which is not itchy or smell. She has not had hot flashes. She obtains it at no cost.  REVIEW OF SYSTEMS: Her skin has healed nicely from the radiation, but she is still fatigued. She is back to work full-time. She is also in the middle of the [place] program  and she is enjoying it. That will and next week. [facility], which [facility] because there are "not too many young people". Aside from this a detailed review of systems today was noncontributory  PAST MEDICAL HISTORY: Past Medical History  Diagnosis Date  . Hypothyroidism   . Rheumatoid arthritis(714.0)   . Osteopenia   . NIDDM (non-insulin dependent diabetes mellitus)   . Multinodular thyroid   . Hypercholesterolemia   . HSV infection   . Osteoporosis 2015  . Cancer ([place])   . Wears contact lenses   . Breast cancer ([place]) 08/24/14    right, upper inner  . Family history of colon cancer   . Family history of ovarian cancer   . Radiation 01/31/15-03/01/15    right  breast    PAST SURGICAL HISTORY: Past Surgical History  Procedure Laterality Date  . Cholecystectomy  1993  . Tubal ligation  1996    re annastomosis  . Cesarean section  [id number]  . Colonoscopy    . Radioactive seed guided mastectomy with axillary sentinel lymph node biopsy Right 10/12/2014    Procedure: RIGHT  PARTIAL MASTECTOMY WITH RADIOACTIVE SEED LOCALIZATION  RIGHT  AXILLARY SENTINEL  NODE BIOPSY;  Surgeon: Fanny Skates, MD;  Location: [place];  Service: General;  Laterality: Right;  . Incision and drainage abscess Right 11/24/2014    Procedure: INCISION AND DRAINAGE RIGHT AXILLA  ABSCESS;  Surgeon: Fanny Skates, MD;  Location: [facility];  Service: General;  Laterality: Right;    FAMILY HISTORY Family History  Problem Relation Age of Onset  . Diabetes Mother     niddm  . Heart disease Mother   . Heart failure Father   .  Heart disease Father   . Ovarian cancer Cousin 41  . Hyperlipidemia Sister   . Colon cancer Maternal Uncle 26   the patient's father died from a myocardial infarction at age 46. The patient's mother died at age 44 from complications of diabetes. The patient had no brothers, one sister. There is no history of cancer in the immediate family but the  patient has one cousin on the mother's side diagnosed with ovarian cancer before the age of 60. Also that cousin's father, the patient's mother's brother, was diagnosed with colon cancer at age 42  GYNECOLOGIC HISTORY:  No LMP recorded. Patient is postmenopausal. Menarche age 64, first live birth age 85. She is GX P2. She went through the change of life approximately age 82. 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 Her husband [name] is in the rental business. The patient's daughter Lajean Manes works at [employer] in [place], and daughter Elray Mcgregor works at [employer]. The patient has 2 grandchildren. She attends our [place]: Not in place   HEALTH MAINTENANCE: Social History  Substance Use Topics  . Smoking status: Never Smoker   . Smokeless tobacco: Never Used  . Alcohol Use: Yes     Comment: occasionally     Colonoscopy: Age 28  PAP: Up-to-date  Bone density: Osteoporosis  Lipid panel:  Allergies  Allergen Reactions  . Shellfish Allergy Hives    Current Outpatient Prescriptions  Medication Sig Dispense Refill  . alendronate (FOSAMAX) 70 MG tablet Take 70 mg by mouth once a week. Take with a full glass of water on an empty stomach.    Marland Kitchen amitriptyline (ELAVIL) 25 MG tablet TAKE ONE TABLET BY MOUTH AT BEDTIME  "OFFICE VISIT NEEDED" 30 tablet 0  . aspirin 81 MG tablet Take 81 mg by mouth daily.    . Calcium Carbonate-Vitamin D 600-200 MG-UNIT TABS Take 1,200 mg by mouth.    . Exenatide ER 2 MG PEN Inject 2 mg into the skin once a week. 1 each 12  . glimepiride (AMARYL) 2 MG tablet TAKE ONE TABLET BY MOUTH ONCE DAILY BEFORE BREAKFAST "OFFICE VISIT NEEDED FOR REFILLS" 90 tablet 0  . Glucosamine 500 MG CAPS Take 1 capsule by mouth daily.    Marland Kitchen ibuprofen (ADVIL,MOTRIN) 200 MG tablet Take 200 mg by mouth every 6 (six) hours as needed.    . lactobacillus acidophilus  (BACID) TABS tablet Take 2 tablets by mouth 3 (three) times daily.    Marland Kitchen lisinopril (PRINIVIL,ZESTRIL) 2.5 MG tablet Take 1 tablet (2.5 mg total) by mouth daily. 90 tablet 1  . metFORMIN (GLUCOPHAGE-XR) 500 MG 24 hr tablet Take 2 tablets (1,000 mg total) by mouth 2 (two) times daily. 360 tablet 0  . Multiple Vitamin (MULTIVITAMIN) capsule Take 1 capsule by mouth daily.    . naproxen sodium (ANAPROX DS) 550 MG tablet Take 1 tablet (550 mg total) by mouth 2 (two) times daily with a meal. 40 tablet 0  . pioglitazone (ACTOS) 30 MG tablet TAKE ONE TABLET BY MOUTH ONCE DAILY  "OFFICE VISIT NEEDED" 30 tablet 0  . simvastatin (ZOCOR) 20 MG tablet Take 1 tablet (20 mg total) by mouth at bedtime. 90 tablet 1  . tamoxifen (NOLVADEX) 20 MG tablet Take 1 tablet (20 mg total) by mouth daily. 90 tablet 4  . TURMERIC PO Take 1 capsule by mouth daily.  No current facility-administered medications for this visit.    OBJECTIVE: Middle-aged Latin American woman who appears stated age 15 Vitals:   08/22/15 0949  BP: 158/59  Pulse: 79  Temp: 98 F (36.7 C)  Resp: 18     Body mass index is 28.38 kg/(m^2).    ECOG FS:1 - Symptomatic but completely ambulatory  Sclerae unicteric, pupils round and equal Oropharynx clear and moist-- no thrush or other lesions No cervical or supraclavicular adenopathy Lungs no rales or rhonchi Heart regular rate and rhythm Abd soft, nontender, positive bowel sounds MSK no focal spinal tenderness, no upper extremity lymphedema Neuro: nonfocal, well oriented, appropriate affect Breasts: The right breast is status post lumpectomy and radiation. The erythema has almost completely resolved. The cosmetic result is excellent. There is no evidence of local recurrence. The right axilla is benign per the left breast is unremarkable.   LAB RESULTS:  CMP     Component Value Date/Time   NA 137 08/15/2015 0950   NA 139 11/24/2014 0724   K 4.2 08/15/2015 0950   K 3.8 11/24/2014  0724   CL 107 11/24/2014 0724   CO2 22 08/15/2015 0950   CO2 23 11/24/2014 0724   GLUCOSE 219* 08/15/2015 0950   GLUCOSE 121* 11/24/2014 0724   BUN 19.2 08/15/2015 0950   BUN 15 11/24/2014 0724   CREATININE 0.8 08/15/2015 0950   CREATININE 0.70 11/24/2014 0724   CALCIUM 9.2 08/15/2015 0950   CALCIUM 9.4 11/24/2014 0724   PROT 7.4 08/15/2015 0950   PROT 7.5 10/11/2014 1600   ALBUMIN 3.7 08/15/2015 0950   ALBUMIN 4.1 10/11/2014 1600   AST 25 08/15/2015 0950   AST 26 10/11/2014 1600   ALT 35 08/15/2015 0950   ALT 26 10/11/2014 1600   ALKPHOS 67 08/15/2015 0950   ALKPHOS 80 10/11/2014 1600   BILITOT 0.43 08/15/2015 0950   BILITOT 0.4 10/11/2014 1600   GFRNONAA >90 11/24/2014 0724   GFRAA >90 11/24/2014 0724    INo results found for: SPEP, UPEP  Lab Results  Component Value Date   WBC 7.6 08/15/2015   NEUTROABS 5.1 08/15/2015   HGB 12.9 08/15/2015   HCT 38.4 08/15/2015   MCV 91.9 08/15/2015   PLT 219 08/15/2015      Chemistry      Component Value Date/Time   NA 137 08/15/2015 0950   NA 139 11/24/2014 0724   K 4.2 08/15/2015 0950   K 3.8 11/24/2014 0724   CL 107 11/24/2014 0724   CO2 22 08/15/2015 0950   CO2 23 11/24/2014 0724   BUN 19.2 08/15/2015 0950   BUN 15 11/24/2014 0724   CREATININE 0.8 08/15/2015 0950   CREATININE 0.70 11/24/2014 0724      Component Value Date/Time   CALCIUM 9.2 08/15/2015 0950   CALCIUM 9.4 11/24/2014 0724   ALKPHOS 67 08/15/2015 0950   ALKPHOS 80 10/11/2014 1600   AST 25 08/15/2015 0950   AST 26 10/11/2014 1600   ALT 35 08/15/2015 0950   ALT 26 10/11/2014 1600   BILITOT 0.43 08/15/2015 0950   BILITOT 0.4 10/11/2014 1600       No results found for: LABCA2  No components found for: HALPF790  No results for input(s): INR in the last 168 hours.  Urinalysis    Component Value Date/Time   COLORURINE YELLOW 04/19/2008 1937   APPEARANCEUR CLOUDY* 04/19/2008 1937   LABSPEC 1.011 04/19/2008 1937   PHURINE 6.5 04/19/2008  1937   GLUCOSEU NEGATIVE 04/19/2008 1937  HGBUR MODERATE* 04/19/2008 1937   BILIRUBINUR negative 06/28/2015 1012   BILIRUBINUR NEGATIVE 04/19/2008 1937   KETONESUR negative 06/28/2015 1012   KETONESUR TRACE* 04/19/2008 1937   PROTEINUR 30* 04/19/2008 1937   UROBILINOGEN 0.2 06/28/2015 1012   UROBILINOGEN 0.2 04/19/2008 1937   NITRITE Negative 06/28/2015 1012   NITRITE NEGATIVE 04/19/2008 1937   LEUKOCYTESUR Negative 06/28/2015 1012    STUDIES: US Breast Ltd Uni Right Inc Axilla  07/27/2015  CLINICAL DATA:  History of treated right breast cancer, status post lumpectomy, right axillary lymph node dissection and radiation therapy in January 2016. The patient complains of right axillary pain. EXAM: DIGITAL DIAGNOSTIC BILATERAL MAMMOGRAM WITH 3D TOMOSYNTHESIS WITH CAD ULTRASOUND RIGHT BREAST COMPARISON:  Previous exam(s). ACR Breast Density Category b: There are scattered areas of fibroglandular density. FINDINGS: Mammographically, there are no suspicious masses, areas of architectural distortion or microcalcifications in the left breast. Postsurgical changes are seen in the right breast, along with skin and breast trabecular thickening, likely due to post radiation changes. BB markers indicate the areas of pain in the right breast upper outer quadrant axillary tail/ lower axilla. Mammographic images were processed with CAD. On physical exam, no suspicious masses are found. Targeted ultrasound is performed, showing no suspicious masses or shadowing lesions. Breast edema is seen, expected post radiation/post treatment finding. IMPRESSION: No mammographic or sonographic evidence of malignancy in either breast, status post right lumpectomy, lymph node dissection and radiation therapy. RECOMMENDATION: Diagnostic mammogram is suggested in 1 year. (Code:DM-B-01Y) I have discussed the findings and recommendations with the patient. Results were also provided in writing at the conclusion of the visit. If  applicable, a reminder letter will be sent to the patient regarding the next appointment. BI-RADS CATEGORY  2: Benign. Electronically Signed   By: Fidela Salisbury M.D.   On: 07/27/2015 11:23   Mm Diag Breast Tomo Bilateral  07/27/2015  CLINICAL DATA:  History of treated right breast cancer, status post lumpectomy, right axillary lymph node dissection and radiation therapy in January 2016. The patient complains of right axillary pain. EXAM: DIGITAL DIAGNOSTIC BILATERAL MAMMOGRAM WITH 3D TOMOSYNTHESIS WITH CAD ULTRASOUND RIGHT BREAST COMPARISON:  Previous exam(s). ACR Breast Density Category b: There are scattered areas of fibroglandular density. FINDINGS: Mammographically, there are no suspicious masses, areas of architectural distortion or microcalcifications in the left breast. Postsurgical changes are seen in the right breast, along with skin and breast trabecular thickening, likely due to post radiation changes. BB markers indicate the areas of pain in the right breast upper outer quadrant axillary tail/ lower axilla. Mammographic images were processed with CAD. On physical exam, no suspicious masses are found. Targeted ultrasound is performed, showing no suspicious masses or shadowing lesions. Breast edema is seen, expected post radiation/post treatment finding. IMPRESSION: No mammographic or sonographic evidence of malignancy in either breast, status post right lumpectomy, lymph node dissection and radiation therapy. RECOMMENDATION: Diagnostic mammogram is suggested in 1 year. (Code:DM-B-01Y) I have discussed the findings and recommendations with the patient. Results were also provided in writing at the conclusion of the visit. If applicable, a reminder letter will be sent to the patient regarding the next appointment. BI-RADS CATEGORY  2: Benign. Electronically Signed   By: Fidela Salisbury M.D.   On: 07/27/2015 11:23    ASSESSMENT: 64 y.o. BRCA negative [place] woman status post right breast  upper inner quadrant biopsy 08/24/2014 for a clinical T1c N0, stage IA invasive ductal carcinoma, grade 1 or 2, strongly estrogen and progesterone receptor positive, with  an MIB-1 of 12%, and no HER-2 amplification  (1) status post right lumpectomy and sentinel lymph node sampling 10/12/2014 for a pT1c pN0, stage IA invasive ductal carcinoma, grade 1, with ample margins; repeat HER-2 again negative  (2) Adjuvant radiation 01/31/2015-03/01/2015:  Right breast/ 42.72 Gy at 2.67 Gy per fraction x 21 fractions.  Right breast boost/ 10 Gy at 2 Gy per fraction x 5 fractions  (3) to start tamoxifen 05/19/2015  (4) genetic testing 04/26/2015 through the [employer] gene panel offered by [facility] found no deleterious mutations in ATM, BARD1, BRCA1, BRCA2, BRIP1, CDH1, CHEK2, EPCAM, MLH1, MRE11A, MSH2, MSH6, MUTYH, NBN, NF1, PALB2, PMS2, PTEN, RAD50, RAD51C, RAD51D, SMARCA4, STK11, and TP53.   PLAN: Destiny Moody is tolerating tamoxifen well and the plan will be to continue that for 5 years. She understands this will cut the risk of a new breast cancer in half as well as cutting the risk of local recurrence in half.  Her sugars are not well controlled at present. She admits to having led to go as she puts it. She is going to be working with her primary care physician to improve that.  She really needs that location. Luckily one is already planned. She will continue to gain energy as she gets a little further way from her radiation treatments.  We're going to see her again in May and yearly thereafter until she completes 5 years of tamoxifen. She knows to call for any problems that may develop before that visit.  Chauncey Cruel, MD   08/22/2015 10:01 AM Medical Oncology and Hematology [facility] [address] [place], [place] [address] Tel. [phone]    Fax. [phone]

## 2015-09-04 ENCOUNTER — Other Ambulatory Visit: Payer: Self-pay | Admitting: Gynecology

## 2015-09-04 ENCOUNTER — Other Ambulatory Visit: Payer: Self-pay | Admitting: Physician Assistant

## 2015-09-27 ENCOUNTER — Ambulatory Visit (HOSPITAL_COMMUNITY)
Admission: RE | Admit: 2015-09-27 | Discharge: 2015-09-27 | Disposition: A | Discharge status: Home / Self Care | Payer: BLUE CROSS/BLUE SHIELD | Source: Ambulatory Visit | Attending: Family Medicine | Admitting: Family Medicine

## 2015-09-27 ENCOUNTER — Ambulatory Visit (INDEPENDENT_AMBULATORY_CARE_PROVIDER_SITE_OTHER): Payer: BLUE CROSS/BLUE SHIELD | Admitting: Family Medicine

## 2015-09-27 VITALS — BP 124/80 | HR 98 | Temp 98.0°F | Resp 17 | Ht 63.5 in | Wt 155.0 lb

## 2015-09-27 DIAGNOSIS — R1031 Right lower quadrant pain: Secondary | ICD-10-CM | POA: Diagnosis present

## 2015-09-27 DIAGNOSIS — R6883 Chills (without fever): Secondary | ICD-10-CM | POA: Diagnosis not present

## 2015-09-27 DIAGNOSIS — D72829 Elevated white blood cell count, unspecified: Secondary | ICD-10-CM | POA: Diagnosis not present

## 2015-09-27 DIAGNOSIS — R112 Nausea with vomiting, unspecified: Secondary | ICD-10-CM | POA: Diagnosis not present

## 2015-09-27 DIAGNOSIS — R630 Anorexia: Secondary | ICD-10-CM | POA: Insufficient documentation

## 2015-09-27 LAB — POCT CBC
Granulocyte percent: 87.7 % — AB (ref 37–80)
HCT, POC: 39.2 % (ref 37.7–47.9)
Hemoglobin: 13.5 g/dL (ref 12.2–16.2)
Lymph, poc: 1.2 (ref 0.6–3.4)
MCH, POC: 31.4 pg — AB (ref 27–31.2)
MCHC: 34.5 g/dL (ref 31.8–35.4)
MCV: 90.8 fL (ref 80–97)
MID (cbc): 0.4 (ref 0–0.9)
MPV: 7.5 fL (ref 0–99.8)
POC Granulocyte: 11.9 — AB (ref 2–6.9)
POC LYMPH PERCENT: 9 %L — AB (ref 10–50)
POC MID %: 3.3 % (ref 0–12)
Platelet Count, POC: 250 10*3/uL (ref 142–424)
RBC: 4.32 M/uL (ref 4.04–5.48)
RDW, POC: 13.1 %
WBC: 13.6 10*3/uL — AB (ref 4.6–10.2)

## 2015-09-27 LAB — POC MICROSCOPIC URINALYSIS (UMFC): Mucus: ABSENT

## 2015-09-27 LAB — POCT URINALYSIS DIP (MANUAL ENTRY)
Bilirubin, UA: NEGATIVE
Blood, UA: NEGATIVE
Glucose, UA: NEGATIVE
Leukocytes, UA: NEGATIVE
Nitrite, UA: NEGATIVE
Protein Ur, POC: NEGATIVE
Spec Grav, UA: 1.01
Urobilinogen, UA: 0.2
pH, UA: 5.5

## 2015-09-27 LAB — POCT URINE PREGNANCY: Preg Test, Ur: NEGATIVE

## 2015-09-27 MED ORDER — IOHEXOL 300 MG/ML  SOLN
100.0000 mL | Freq: Once | INTRAMUSCULAR | Status: AC | PRN
  Administered 2015-09-28: 100 mL via INTRAVENOUS

## 2015-09-27 MED ORDER — IOHEXOL 300 MG/ML  SOLN
50.0000 mL | Freq: Once | INTRAMUSCULAR | Status: AC | PRN
  Administered 2015-09-27: 50 mL via ORAL

## 2015-09-27 NOTE — Progress Notes (Addendum)
 Chief Complaint:  Chief Complaint  Patient presents with  . Nausea  . Emesis  . Abdominal Pain    HPI: Destiny Moody is a 65 y.o. female who reports to [facility] today complaining of : Sharp intermittent Right lower quadrant abd pain and radiating to the back since 11 am, she had some soy protein and banana and milk and was able to keep it down for only 1 hour, then she threw up the breakfasrt. She ate jelly and alkaselzter and threw that up as well. She has had cholecystectomy, she has had a c section.No hx of obstruction or renal stones.  Associated sxs include: chills today, without fevers.  She colonscopy 12 years ago and was normal with  Dr Saralyn Pilar HUng. No hx of diverticulosis but she is not sure. She denies any diarrhea, Normal BM this morning. She has had some flatus. She denies any rectal bleeding, or any urianry sxs. SHe has had pyelonephritis in the past but this feels different.     CT scan for prior showed periumbilical hernia   At abd and pelvis from 2009  IMPRESSION: 1. Mild right pyelonephritis. Ureteral ectasia consistent with ascending right-sided urinary tract infection. 2. No acute abdominal abnormality. 3. Small fat containing periumbilical hernia. Recommend clinical correlation as this could potentially represent a source of pain. 4. Cholecystectomy.  CT PELVIS  Findings: Mild enhancement and ectasia of the right ureter, without stone or obstructing lesion identified. Question ascending right urinary tract infection. Mild fecal burden. Colon otherwise within normal limits. Urinary bladder appears normal. Uterus and adnexa within normal limits. Appendix identified and normal. Iliofemoral atherosclerosis. Left greater than right SI joint degenerative disease. Lumbosacral spondylosis, mild.  IMPRESSION: 1. No acute pelvic abnormality. 2. Right ureteral ectasia and enhancement consistent with ascending urinary tract  infection.  Provider: Angelica Chessman  Past Medical History  Diagnosis Date  . Hypothyroidism   . Rheumatoid arthritis(714.0)   . Osteopenia   . NIDDM (non-insulin dependent diabetes mellitus)   . Multinodular thyroid   . Hypercholesterolemia   . HSV infection   . Osteoporosis 2015  . Cancer ([place])   . Wears contact lenses   . Breast cancer ([place]) 08/24/14    right, upper inner  . Family history of colon cancer   . Family history of ovarian cancer   . Radiation 01/31/15-03/01/15    right  breast   Past Surgical History  Procedure Laterality Date  . Cholecystectomy  1993  . Tubal ligation  1996    re annastomosis  . Cesarean section  [id number]  . Colonoscopy    . Radioactive seed guided mastectomy with axillary sentinel lymph node biopsy Right 10/12/2014    Procedure: RIGHT  PARTIAL MASTECTOMY WITH RADIOACTIVE SEED LOCALIZATION  RIGHT  AXILLARY SENTINEL  NODE BIOPSY;  Surgeon: Fanny Skates, MD;  Location: [place];  Service: General;  Laterality: Right;  . Incision and drainage abscess Right 11/24/2014    Procedure: INCISION AND DRAINAGE RIGHT AXILLA  ABSCESS;  Surgeon: Fanny Skates, MD;  Location: [place];  Service: General;  Laterality: Right;   Social History   Social History  . Marital Status: Married    Spouse Name: [name]  . Number of Children: 2  . Years of Education: College   Occupational History  .      Self Employed   Social History Main Topics  . Smoking status: Never Smoker   . Smokeless tobacco: Never Used  . Alcohol Use:  Yes     Comment: occasionally  . Drug Use: No  . Sexual Activity: Not Asked     Comment: menarche age 60, G46 , P 2, menopause age 67, no HRT   Other Topics Concern  . None   Social History Narrative   Patient lives at home with her spouse.   Caffeine use: 1 cup daily   Family History  Problem Relation Age of Onset  . Diabetes Mother     niddm  . Heart disease Mother   . Heart failure Father   . Heart  disease Father   . Ovarian cancer Cousin 11  . Hyperlipidemia Sister   . Colon cancer Maternal Uncle 70   Allergies  Allergen Reactions  . Shellfish Allergy Hives   Prior to Admission medications   Medication Sig Start Date End Date Taking? Authorizing Provider  alendronate (FOSAMAX) 70 MG tablet Take 70 mg by mouth once a week. Take with a full glass of water on an empty stomach.   Yes Historical Provider, MD  alendronate (FOSAMAX) 70 MG tablet TAKE ONE TABLET BY MOUTH ONCE A WEEK WITH A FULL GLASS OF WATER ON AN EMPTY STOMACH 09/05/15  Yes Terrance Mass, MD  amitriptyline (ELAVIL) 25 MG tablet TAKE ONE TABLET BY MOUTH AT BEDTIME  "OFFICE VISIT NEEDED" 07/10/15  Yes Wendie Agreste, MD  aspirin 81 MG tablet Take 81 mg by mouth daily.   Yes Historical Provider, MD  Calcium Carbonate-Vitamin D 600-200 MG-UNIT TABS Take 1,200 mg by mouth.   Yes Historical Provider, MD  Exenatide ER 2 MG PEN Inject 2 mg into the skin once a week. 01/17/15  Yes Wendie Agreste, MD  glimepiride (AMARYL) 2 MG tablet TAKE ONE TABLET BY MOUTH ONCE DAILY BEFORE BREAKFAST "OFFICE VISIT NEEDED FOR REFILLS" 08/15/15  Yes Wendie Agreste, MD  Glucosamine 500 MG CAPS Take 1 capsule by mouth daily.   Yes Historical Provider, MD  ibuprofen (ADVIL,MOTRIN) 200 MG tablet Take 200 mg by mouth every 6 (six) hours as needed.   Yes Historical Provider, MD  lactobacillus acidophilus (BACID) TABS tablet Take 2 tablets by mouth 3 (three) times daily.   Yes Historical Provider, MD  lisinopril (PRINIVIL,ZESTRIL) 2.5 MG tablet Take 1 tablet (2.5 mg total) by mouth daily. 01/17/15  Yes Wendie Agreste, MD  metFORMIN (GLUCOPHAGE-XR) 500 MG 24 hr tablet Take 2 tablets (1,000 mg total) by mouth 2 (two) times daily. 06/30/15  Yes  P , DO  Multiple Vitamin (MULTIVITAMIN) capsule Take 1 capsule by mouth daily.   Yes Historical Provider, MD  naproxen sodium (ANAPROX DS) 550 MG tablet Take 1 tablet (550 mg total) by mouth 2 (two) times  daily with a meal. 07/09/15 07/08/16 Yes Roselee Culver, MD  pioglitazone (ACTOS) 30 MG tablet TAKE ONE TABLET BY MOUTH ONCE DAILY  "NEEDS OFFICE VISIT FOR REFILLS" 09/06/15  Yes Tereasa Coop, PA-C  simvastatin (ZOCOR) 20 MG tablet Take 1 tablet (20 mg total) by mouth at bedtime. 01/17/15  Yes Wendie Agreste, MD  tamoxifen (NOLVADEX) 20 MG tablet Take 1 tablet (20 mg total) by mouth daily. 08/22/15  Yes Chauncey Cruel, MD  TURMERIC PO Take 1 capsule by mouth daily.   Yes Historical Provider, MD     ROS: The patient denies fevers, night sweats, unintentional weight loss, chest pain, palpitations, wheezing, dyspnea on exertion,  dysuria, hematuria, melena, numbness, weakness, or tingling.   All other systems have been reviewed and  were otherwise negative with the exception of those mentioned in the HPI and as above.    PHYSICAL EXAM: Filed Vitals:   09/27/15 2009  BP: 124/80  Pulse: 98  Temp: 98 F (36.7 C)  Resp: 17   Body mass index is 27.02 kg/(m^2).   General: Alert, no acute distress HEENT:  Normocephalic, atraumatic, oropharynx patent. EOMI, PERRLA, dehydrated Cardiovascular:  Regular rate and rhythm, no rubs murmurs or gallops.  No Carotid bruits, radial pulse intact. No pedal edema.  Respiratory: Clear to auscultation bilaterally.  No wheezes, rales, or rhonchi.  No cyanosis, no use of accessory musculature Abdominal: No organomegaly, abdomen is soft and Perimumbilical and RLQ abd tenderness, no rebound, some minimal guarding.  positive bowel sounds. No masses. Skin: No rashes. Neurologic: Facial musculature symmetric. Psychiatric: Patient acts appropriately throughout our interaction. Lymphatic: No cervical or submandibular lymphadenopathy Musculoskeletal: Gait intact. No edema, tenderness   LABS: Results for orders placed or performed in visit on 09/27/15  POCT CBC  Result Value Ref Range   WBC 13.6 (A) 4.6 - 10.2 K/uL   Lymph, poc 1.2 0.6 - 3.4   POC LYMPH  PERCENT 9.0 (A) 10 - 50 %L   MID (cbc) 0.4 0 - 0.9   POC MID % 3.3 0 - 12 %M   POC Granulocyte 11.9 (A) 2 - 6.9   Granulocyte percent 87.7 (A) 37 - 80 %G   RBC 4.32 4.04 - 5.48 M/uL   Hemoglobin 13.5 12.2 - 16.2 g/dL   HCT, POC 39.2 37.7 - 47.9 %   MCV 90.8 80 - 97 fL   MCH, POC 31.4 (A) 27 - 31.2 pg   MCHC 34.5 31.8 - 35.4 g/dL   RDW, POC 13.1 %   Platelet Count, POC 250 142 - 424 K/uL   MPV 7.5 0 - 99.8 fL  POCT urinalysis dipstick  Result Value Ref Range   Color, UA yellow yellow   Clarity, UA clear clear   Glucose, UA negative negative   Bilirubin, UA negative negative   Ketones, POC UA trace (5) (A) negative   Spec Grav, UA 1.010    Blood, UA negative negative   pH, UA 5.5    Protein Ur, POC negative negative   Urobilinogen, UA 0.2    Nitrite, UA Negative Negative   Leukocytes, UA Negative Negative  POCT Microscopic Urinalysis ([facility])  Result Value Ref Range   WBC,UR,HPF,POC Few (A) None WBC/hpf   RBC,UR,HPF,POC None None RBC/hpf   Bacteria Few (A) None, Too numerous to count   Mucus Absent Absent   Epithelial Cells, UR Per Microscopy Few (A) None, Too numerous to count cells/hpf     EKG/XRAY:   Primary read interpreted by [facility]. Marin Comment at [facility].   ASSESSMENT/PLAN: Encounter Diagnoses  Name Primary?  . Right lower quadrant abdominal pain Yes  . Non-intractable vomiting with nausea, unspecified vomiting type   . Chills   . Leukocytosis    65 y/o hispanic female with past medical hx of diabetes breast cancer, cholecystectomy, and also c section who presents with  Acute onset of periumbilical right lower quadrant abd pain Rule out appendicitis, declines to go to ED at thsi time , would rather have stat outpatient Ct scan first at [facility] hospital. Risks and benefits discussed with patient She has a hx of pylenopneprhitis and also periumbiclical hernia, not utd on colonscopy but was normal 12 years ago, no hx of diverticular disease or renal stones Fu with labs and CT scan  Gross sideeffects, risk and benefits, and alternatives of medications d/w patient. Patient is aware that all medications have potential sideeffects and we are unable to predict every sideeffect or drug-drug interaction that may occur.    DO  09/27/2015 9:07 PM   1//07/2016 @ 12:48 AM  Spoke with patient about ifficia CT resutls  She will try zofran odt and see how she does with pushing fluids.  CT Results normal    COMPARISON: CT dated 04/19/2008  FINDINGS: The visualized lung bases are clear. There is coronary vascular calcification. No intra-abdominal free air or free fluid.  Cholecystectomy. The liver, pancreas, spleen, adrenal glands, kidneys, visualized ureters, and urinary bladder appear unremarkable. The uterus and the ovaries are grossly unremarkable.  There is apparent soft tissue prominence of the posterior wall of the rectum (series 2, image 81). This is similar to the study dated 04/19/2008 and likely represent rectal mucosa fold. There is no evidence of bowel obstruction or inflammation. Normal appendix.  There is aortoiliac atherosclerotic disease. The abdominal aorta and IVC appear patent. No portal venous gas identified. There is no adenopathy. Small stable infraumbilical fat containing hernia with mild stranding of the herniated fat similar to prior study. No fluid collection. Midline vertical anterior pelvic wall incisional scar. The abdominal wall soft tissues are otherwise unremarkable. There is degenerative changes of the spine. The osseous structures are otherwise intact.  IMPRESSION: No acute intra-abdominal pelvic pathology.   Electronically Signed  By: Anner Crete M.D.  On: 09/28/2015 00:38

## 2015-09-27 NOTE — Patient Instructions (Signed)
Go to [facility] to register for OUTPATIENT CT. DO NOT REGISTER AS ED PATIENT

## 2015-09-28 ENCOUNTER — Telehealth: Payer: Self-pay | Admitting: Family Medicine

## 2015-09-28 DIAGNOSIS — D72829 Elevated white blood cell count, unspecified: Secondary | ICD-10-CM | POA: Diagnosis not present

## 2015-09-28 LAB — COMPLETE METABOLIC PANEL WITH GFR
ALT: 46 U/L — ABNORMAL HIGH (ref 6–29)
AST: 34 U/L (ref 10–35)
Albumin: 4.1 g/dL (ref 3.6–5.1)
Alkaline Phosphatase: 49 U/L (ref 33–130)
BUN: 15 mg/dL (ref 7–25)
CO2: 23 mmol/L (ref 20–31)
Creat: 0.73 mg/dL (ref 0.50–0.99)
Glucose, Bld: 183 mg/dL — ABNORMAL HIGH (ref 65–99)
Sodium: 135 mmol/L (ref 135–146)
Total Bilirubin: 0.6 mg/dL (ref 0.2–1.2)
Total Protein: 7 g/dL (ref 6.1–8.1)

## 2015-09-28 LAB — COMPLETE METABOLIC PANEL WITHOUT GFR
Calcium: 8.9 mg/dL (ref 8.6–10.4)
Chloride: 101 mmol/L (ref 98–110)
GFR, Est African American: >89 mL/min (ref >=60)
GFR, Est Non African American: 89 mL/min (ref >=60)
Potassium: 3.9 mmol/L (ref 3.5–5.3)

## 2015-09-28 MED ORDER — ONDANSETRON 4 MG PO TBDP: 4.0000 mg | ORAL_TABLET | Freq: Four times a day (QID) | ORAL | Status: DC | PRN

## 2015-09-28 NOTE — Telephone Encounter (Signed)
Spoke with Destiny Moody  Again this AM, she is feeling somewhat better, able to eat and drink some. D/w her labs again and also CT results again, she has not picked up zofran. She will fu if sxs return or worsen.

## 2015-09-28 NOTE — Addendum Note (Signed)
Addended by: Glenford Bayley on: 09/28/2015 12:51 AM   Modules accepted: Orders

## 2015-10-07 ENCOUNTER — Other Ambulatory Visit: Payer: Self-pay | Admitting: Physician Assistant

## 2015-10-11 ENCOUNTER — Other Ambulatory Visit: Payer: Self-pay | Admitting: Physician Assistant

## 2015-10-11 ENCOUNTER — Other Ambulatory Visit: Payer: Self-pay | Admitting: Family Medicine

## 2015-10-16 ENCOUNTER — Other Ambulatory Visit: Payer: Self-pay | Admitting: Gynecology

## 2015-10-17 ENCOUNTER — Telehealth: Payer: Self-pay

## 2015-10-17 ENCOUNTER — Other Ambulatory Visit: Payer: Self-pay

## 2015-10-17 NOTE — Telephone Encounter (Signed)
Pharm sent PA req for Bydureon, but according to [facility] 2017 Formulary, it is covered by ins in the quantity that pt uses. Called [facility] and gave this info and asked them to call pharm help desk.

## 2015-10-17 NOTE — Telephone Encounter (Signed)
[employer] called back and asked two more ?s, 1. If she has been taking the medication, and 2. What other meds she has tried for DM. I provided info and PA is pending.

## 2015-10-17 NOTE — Telephone Encounter (Signed)
Pharm called back and reported that Destiny Moody stated bydureon is no longer on formulary, even though it is listed on formulary online. Completed PA on covermymeds. Pending.

## 2015-10-18 ENCOUNTER — Ambulatory Visit (INDEPENDENT_AMBULATORY_CARE_PROVIDER_SITE_OTHER): Payer: BLUE CROSS/BLUE SHIELD | Admitting: Family Medicine

## 2015-10-18 ENCOUNTER — Encounter: Payer: Self-pay | Admitting: Family Medicine

## 2015-10-18 VITALS — BP 122/72 | HR 81 | Temp 98.0°F | Resp 17 | Ht 64.0 in | Wt 151.0 lb

## 2015-10-18 DIAGNOSIS — I10 Essential (primary) hypertension: Secondary | ICD-10-CM

## 2015-10-18 DIAGNOSIS — E119 Type 2 diabetes mellitus without complications: Secondary | ICD-10-CM

## 2015-10-18 DIAGNOSIS — G44029 Chronic cluster headache, not intractable: Secondary | ICD-10-CM | POA: Diagnosis not present

## 2015-10-18 DIAGNOSIS — E785 Hyperlipidemia, unspecified: Secondary | ICD-10-CM | POA: Diagnosis not present

## 2015-10-18 LAB — CBC
HCT: 40.6 % (ref 36.0–46.0)
Hemoglobin: 13.4 g/dL (ref 12.0–15.0)
MCH: 30.5 pg (ref 26.0–34.0)
MCHC: 33 g/dL (ref 30.0–36.0)
MCV: 92.5 fL (ref 78.0–100.0)
MPV: 10.5 fL (ref 8.6–12.4)
PLATELETS: 262 10*3/uL (ref 150–400)
RBC: 4.39 MIL/uL (ref 3.87–5.11)
RDW: 13.1 % (ref 11.5–15.5)
WBC: 6.2 10*3/uL (ref 4.0–10.5)

## 2015-10-18 LAB — POCT GLYCOSYLATED HEMOGLOBIN (HGB A1C): HEMOGLOBIN A1C: 6.7

## 2015-10-18 LAB — LIPID PANEL
CHOLESTEROL: 119 mg/dL — AB (ref 125–200)
HDL: 45 mg/dL — ABNORMAL LOW (ref >=46)
LDL Cholesterol: 56 mg/dL (ref <130)
Total CHOL/HDL Ratio: 2.6 Ratio (ref <=5.0)
Triglycerides: 89 mg/dL (ref <150)
VLDL: 18 mg/dL (ref <30)

## 2015-10-18 MED ORDER — ALENDRONATE SODIUM 70 MG PO TABS: ORAL_TABLET | ORAL | Status: DC

## 2015-10-18 MED ORDER — METFORMIN HCL ER 500 MG PO TB24: 1000.0000 mg | ORAL_TABLET | Freq: Two times a day (BID) | ORAL | Status: DC

## 2015-10-18 MED ORDER — GLIMEPIRIDE 2 MG PO TABS: ORAL_TABLET | ORAL | Status: DC

## 2015-10-18 MED ORDER — LISINOPRIL 2.5 MG PO TABS: 2.5000 mg | ORAL_TABLET | Freq: Every day | ORAL | Status: DC

## 2015-10-18 MED ORDER — PIOGLITAZONE HCL 30 MG PO TABS: ORAL_TABLET | ORAL | Status: DC

## 2015-10-18 MED ORDER — AMITRIPTYLINE HCL 25 MG PO TABS: ORAL_TABLET | ORAL | Status: DC

## 2015-10-18 MED ORDER — SIMVASTATIN 20 MG PO TABS: 20.0000 mg | ORAL_TABLET | Freq: Every day | ORAL | Status: DC

## 2015-10-18 NOTE — Patient Instructions (Addendum)
I will be in touch with the rest of your labs Your A1c is well controlled.  Since the Byrdureon is not covered by your insurnace we may want to just do without it. Be more careful about your diet and exercise routines and let's plan to recheck your A1c in 4 months or so

## 2015-10-18 NOTE — Progress Notes (Signed)
[facility] [address], [place] [address] [phone]  Date:  10/18/2015   Name:  Destiny Moody   DOB:  Sep 18, 1950   MRN:  [id number]  PCP:  Wendie Agreste, MD    Chief Complaint: Diabetes   History of Present Illness:  Destiny Moody is a 65 y.o. very pleasant female patient who presents with the following:  Here today to follow-up on her DM She takes actos 30 mg once a day for her DM, metformin 1 gm BID, amaryl 2 mg- she is also using bydureon but this is not covered by her insurnace any longer so she is not sure what to do next  She reports that she did have a pneumonia vaccine in 2015 at another clinic   She is fasting today for labs  She states that her insurance is going through some changes that have her confused about her coverage   Lab Results  Component Value Date   HGBA1C 6.8 06/28/2015     Patient Active Problem List   Diagnosis Date Noted  . Genetic testing 04/27/2015  . Family history of colon cancer   . Family history of ovarian cancer   . Diabetes mellitus type II, non insulin dependent ([place]) 03/31/2015  . Abscess of axilla, right 11/24/2014  . Breast cancer of upper-inner quadrant of right female breast ([place]) 08/26/2014  . Osteoporosis 08/10/2014  . Vaginal atrophy 04/27/2014  . RLQ PAIN 04/08/2008  . GERD 04/07/2008  . FATTY LIVER DISEASE 04/07/2008    Past Medical History  Diagnosis Date  . Hypothyroidism   . Rheumatoid arthritis(714.0)   . Osteopenia   . NIDDM (non-insulin dependent diabetes mellitus)   . Multinodular thyroid   . Hypercholesterolemia   . HSV infection   . Osteoporosis 2015  . Cancer ([place])   . Wears contact lenses   . Breast cancer ([place]) 08/24/14    right, upper inner  . Family history of colon cancer   . Family history of ovarian cancer   . Radiation 01/31/15-03/01/15    right  breast    Past Surgical History  Procedure Laterality Date  . Cholecystectomy  1993  . Tubal ligation  1996   re annastomosis  . Cesarean section  [id number]  . Colonoscopy    . Radioactive seed guided mastectomy with axillary sentinel lymph node biopsy Right 10/12/2014    Procedure: RIGHT  PARTIAL MASTECTOMY WITH RADIOACTIVE SEED LOCALIZATION  RIGHT  AXILLARY SENTINEL  NODE BIOPSY;  Surgeon: Fanny Skates, MD;  Location: [place];  Service: General;  Laterality: Right;  . Incision and drainage abscess Right 11/24/2014    Procedure: INCISION AND DRAINAGE RIGHT AXILLA  ABSCESS;  Surgeon: Fanny Skates, MD;  Location: [place];  Service: General;  Laterality: Right;    Social History  Substance Use Topics  . Smoking status: Never Smoker   . Smokeless tobacco: Never Used  . Alcohol Use: Yes     Comment: occasionally    Family History  Problem Relation Age of Onset  . Diabetes Mother     niddm  . Heart disease Mother   . Heart failure Father   . Heart disease Father   . Ovarian cancer Cousin 69  . Hyperlipidemia Sister   . Colon cancer Maternal Uncle 70    Allergies  Allergen Reactions  . Shellfish Allergy Hives    Medication list has been reviewed and updated.  Current Outpatient Prescriptions on File Prior to Visit  Medication Sig Dispense Refill  . alendronate (FOSAMAX) 70 MG tablet Take 70 mg by mouth once a week. Take with a full glass of water on an empty stomach.    Marland Kitchen alendronate (FOSAMAX) 70 MG tablet TAKE ONE TABLET BY MOUTH ONCE A WEEK WITH A FULL GLASS OF WATER ON AN EMPTY STOMACH 4 tablet 0  . amitriptyline (ELAVIL) 25 MG tablet TAKE ONE TABLET BY MOUTH AT BEDTIME  "OFFICE VISIT NEEDED" 30 tablet 0  . aspirin 81 MG tablet Take 81 mg by mouth daily.    . Calcium Carbonate-Vitamin D 600-200 MG-UNIT TABS Take 1,200 mg by mouth.    . Exenatide ER 2 MG PEN Inject 2 mg into the skin once a week. 1 each 12  . glimepiride (AMARYL) 2 MG tablet TAKE ONE TABLET BY MOUTH ONCE DAILY BEFORE BREAKFAST. NEED OFFICE VISIT 90 tablet 0  . Glucosamine 500 MG CAPS Take 1 capsule  by mouth daily.    Marland Kitchen ibuprofen (ADVIL,MOTRIN) 200 MG tablet Take 200 mg by mouth every 6 (six) hours as needed.    . lactobacillus acidophilus (BACID) TABS tablet Take 2 tablets by mouth 3 (three) times daily.    Marland Kitchen lisinopril (PRINIVIL,ZESTRIL) 2.5 MG tablet Take 1 tablet (2.5 mg total) by mouth daily. 90 tablet 1  . metFORMIN (GLUCOPHAGE-XR) 500 MG 24 hr tablet Take 2 tablets (1,000 mg total) by mouth 2 (two) times daily. 360 tablet 0  . Multiple Vitamin (MULTIVITAMIN) capsule Take 1 capsule by mouth daily.    . naproxen sodium (ANAPROX DS) 550 MG tablet Take 1 tablet (550 mg total) by mouth 2 (two) times daily with a meal. 40 tablet 0  . ondansetron (ZOFRAN ODT) 4 MG disintegrating tablet Take 1 tablet (4 mg total) by mouth every 6 (six) hours as needed for nausea or vomiting. Take 15 min before food/water. Drink lots of fluid. 20 tablet 0  . pioglitazone (ACTOS) 30 MG tablet TAKE ONE TABLET BY MOUTH ONCE DAILY *NEEDS  OFFICE  VISIT  FOR  REFILLS* 15 tablet 0  . pioglitazone (ACTOS) 30 MG tablet TAKE ONE TABLET BY MOUTH ONCE DAILY ** OFFICE VISIT NEEDED** 30 tablet 0  . simvastatin (ZOCOR) 20 MG tablet Take 1 tablet (20 mg total) by mouth at bedtime. 90 tablet 1  . tamoxifen (NOLVADEX) 20 MG tablet Take 1 tablet (20 mg total) by mouth daily. 90 tablet 4  . TURMERIC PO Take 1 capsule by mouth daily.     No current facility-administered medications on file prior to visit.    Review of Systems:  As per HPI- otherwise negative.   Physical Examination: Filed Vitals:   10/18/15 1049  BP: 122/72  Pulse: 81  Temp: 98 F (36.7 C)  Resp: 17   Filed Vitals:   10/18/15 1049  Height: 5\' 4"  (1.626 m)  Weight: 151 lb (68.493 kg)   Body mass index is 25.91 kg/(m^2). Ideal Body Weight: Weight in (lb) to have BMI = 25: 145.3  GEN: WDWN, NAD, Non-toxic, A & O x 3, minimal overweight, looks well HEENT: Atraumatic, Normocephalic. Neck supple. No masses, No LAD. Ears and Nose: No external  deformity. CV: RRR, No M/G/R. No JVD. No thrill. No extra heart sounds. PULM: CTA B, no wheezes, crackles, rhonchi. No retractions. No resp. distress. No accessory muscle use. ABD: S, NT, ND, +BS. No rebound. No HSM. EXTR: No c/c/e NEURO Normal gait.  PSYCH: Normally interactive. Conversant. Not depressed or anxious appearing.  Calm demeanor.  Results for orders placed or performed in visit on 10/18/15  POCT glycosylated hemoglobin (Hb A1C)  Result Value Ref Range   Hemoglobin A1C 6.7    Assessment and Plan: Controlled type 2 diabetes mellitus without complication, without long-term current use of insulin (HCC) - Plan: pioglitazone (ACTOS) 30 MG tablet, metFORMIN (GLUCOPHAGE-XR) 500 MG 24 hr tablet, glimepiride (AMARYL) 2 MG tablet, alendronate (FOSAMAX) 70 MG tablet, POCT glycosylated hemoglobin (Hb A1C), CBC, Lipid panel  Hyperlipemia - Plan: simvastatin (ZOCOR) 20 MG tablet  Essential hypertension - Plan: lisinopril (PRINIVIL,ZESTRIL) 2.5 MG tablet  Chronic cluster headache, not intractable - Plan: amitriptyline (ELAVIL) 25 MG tablet  Did all her refills today She cannot afford her bydureon and is not aware of any GLP-1 agonists that may be preferred.  For the time being we will stop this medication and continue her other 3 DM meds Urged her to watch her diet more carefully- recheck in 4 months   Signed Lamar Blinks, MD

## 2015-10-19 ENCOUNTER — Encounter: Payer: Self-pay | Admitting: Family Medicine

## 2015-10-19 NOTE — Telephone Encounter (Signed)
PA was cancelled again on covermymeds, as if PA is not needed, but I have not received any letters from ins w/any information. According to OV notes from 10/18/15, Dr Lorelei Pont took pt off of this for now and will re-eval after next check up.

## 2015-10-19 NOTE — Telephone Encounter (Signed)
I did receive a form to complete for PA, and I filled it out and faxed back to ins. Maybe they will answer this time with a decision or tell us what the problem is.

## 2015-10-25 NOTE — Telephone Encounter (Signed)
I received a call finally from [facility] stating that the PA was denied, because pt has not tried/failed at least two formulary alternatives. According to ins, pt has tried Victoza (although I don't see a record of that). Rep didn't have a list of covered alternatives, but I'm guessing probably Trulicity. Do you want to try Rxing that one?

## 2015-10-27 NOTE — Telephone Encounter (Signed)
Right now we are going to see how she does without a GLP-1 and recheck her A1c in a few months.

## 2015-11-17 ENCOUNTER — Other Ambulatory Visit: Payer: Self-pay | Admitting: Family Medicine

## 2015-11-17 ENCOUNTER — Encounter: Payer: Self-pay | Admitting: Adult Health

## 2015-11-17 NOTE — Progress Notes (Signed)
A birthday card was mailed to the patient today on behalf of the Survivorship Program at [facility].   Barbarajean Kinzler, NP Survivorship Program [facility] [phone]  

## 2015-11-18 ENCOUNTER — Encounter: Payer: BLUE CROSS/BLUE SHIELD | Admitting: Gynecology

## 2015-12-06 ENCOUNTER — Ambulatory Visit (INDEPENDENT_AMBULATORY_CARE_PROVIDER_SITE_OTHER): Payer: BLUE CROSS/BLUE SHIELD | Admitting: Women's Health

## 2015-12-06 ENCOUNTER — Encounter: Payer: Self-pay | Admitting: Women's Health

## 2015-12-06 VITALS — BP 118/76 | Ht 62.0 in | Wt 149.0 lb

## 2015-12-06 DIAGNOSIS — E119 Type 2 diabetes mellitus without complications: Secondary | ICD-10-CM

## 2015-12-06 DIAGNOSIS — Z01419 Encounter for gynecological examination (general) (routine) without abnormal findings: Secondary | ICD-10-CM

## 2015-12-06 DIAGNOSIS — N898 Other specified noninflammatory disorders of vagina: Secondary | ICD-10-CM | POA: Diagnosis not present

## 2015-12-06 LAB — WET PREP FOR TRICH, YEAST, CLUE
CLUE CELLS WET PREP: NONE SEEN
TRICH WET PREP: NONE SEEN
Yeast Wet Prep HPF POC: NONE SEEN

## 2015-12-06 MED ORDER — FLUCONAZOLE 150 MG PO TABS: 150.0000 mg | ORAL_TABLET | Freq: Once | ORAL | Status: DC

## 2015-12-06 MED ORDER — ALENDRONATE SODIUM 70 MG PO TABS: 70.0000 mg | ORAL_TABLET | ORAL | Status: DC

## 2015-12-06 MED ORDER — ALENDRONATE SODIUM 70 MG PO TABS: ORAL_TABLET | ORAL | Status: DC

## 2015-12-06 NOTE — Patient Instructions (Signed)
Basic Carbohydrate Counting for Diabetes Mellitus Carbohydrate counting is a method for keeping track of the amount of carbohydrates you eat. Eating carbohydrates naturally increases the level of sugar (glucose) in your blood, so it is important for you to know the amount that is okay for you to have in every meal. Carbohydrate counting helps keep the level of glucose in your blood within normal limits. The amount of carbohydrates allowed is different for every person. A dietitian can help you calculate the amount that is right for you. Once you know the amount of carbohydrates you can have, you can count the carbohydrates in the foods you want to eat. Carbohydrates are found in the following foods:  Grains, such as breads and cereals.  Dried beans and soy products.  Starchy vegetables, such as potatoes, peas, and corn.  Fruit and fruit juices.  Milk and yogurt.  Sweets and snack foods, such as cake, cookies, candy, chips, soft drinks, and fruit drinks. CARBOHYDRATE COUNTING There are two ways to count the carbohydrates in your food. You can use either of the methods or a combination of both. Reading the "Nutrition Facts" on [place] The "Nutrition Facts" is an area that is included on the labels of almost all packaged food and beverages in the [place]. It includes the serving size of that food or beverage and information about the nutrients in each serving of the food, including the grams (g) of carbohydrate per serving.  Decide the number of servings of this food or beverage that you will be able to eat or drink. Multiply that number of servings by the number of grams of carbohydrate that is listed on the label for that serving. The total will be the amount of carbohydrates you will be having when you eat or drink this food or beverage. Learning Standard Serving Sizes of Food When you eat food that is not packaged or does not include "Nutrition Facts" on the label, you need to  measure the servings in order to count the amount of carbohydrates.A serving of most carbohydrate-rich foods contains about 15 g of carbohydrates. The following list includes serving sizes of carbohydrate-rich foods that provide 15 g ofcarbohydrate per serving:   1 slice of bread (1 oz) or 1 six-inch tortilla.    of a hamburger bun or English muffin.  4-6 crackers.   cup unsweetened dry cereal.    cup hot cereal.   cup rice or pasta.    cup mashed potatoes or  of a large baked potato.  1 cup fresh fruit or one small piece of fruit.    cup canned or frozen fruit or fruit juice.  1 cup milk.   cup plain fat-free yogurt or yogurt sweetened with artificial sweeteners.   cup cooked dried beans or starchy vegetable, such as peas, corn, or potatoes.  Decide the number of standard-size servings that you will eat. Multiply that number of servings by 15 (the grams of carbohydrates in that serving). For example, if you eat 2 cups of strawberries, you will have eaten 2 servings and 30 g of carbohydrates (2 servings x 15 g = 30 g). For foods such as soups and casseroles, in which more than one food is mixed in, you will need to count the carbohydrates in each food that is included. EXAMPLE OF CARBOHYDRATE COUNTING Sample Dinner  3 oz chicken breast.   cup of brown rice.   cup of corn.  1 cup milk.   1 cup strawberries with  sugar-free whipped topping.  Carbohydrate Calculation Step 1: Identify the foods that contain carbohydrates:   Rice.   Corn.   Milk.   Strawberries. Step 2:Calculate the number of servings eaten of each:   2 servings of rice.   1 serving of corn.   1 serving of milk.   1 serving of strawberries. Step 3: Multiply each of those number of servings by 15 g:   2 servings of rice x 15 g = 30 g.   1 serving of corn x 15 g = 15 g.   1 serving of milk x 15 g = 15 g.   1 serving of strawberries x 15 g = 15 g. Step 4: Add  together all of the amounts to find the total grams of carbohydrates eaten: 30 g + 15 g + 15 g + 15 g = 75 g.   This information is not intended to replace advice given to you by your health care provider. Make sure you discuss any questions you have with your health care provider.   Document Released: 09/03/2005 Document Revised: 09/24/2014 Document Reviewed: 07/31/2013 Elsevier Interactive Patient Education [employer]. Menopause is a normal process in which your reproductive ability comes to an end. This process happens gradually over a span of months to years, usually between the ages of 64 and 37. Menopause is complete when you have missed 12 consecutive menstrual periods. It is important to talk with your health care provider about some of the most common conditions that affect postmenopausal women, such as heart disease, cancer, and bone loss (osteoporosis). Adopting a healthy lifestyle and getting preventive care can help to promote your health and wellness. Those actions can also lower your chances of developing some of these common conditions. WHAT SHOULD I KNOW ABOUT MENOPAUSE? During menopause, you may experience a number of symptoms, such as:  Moderate-to-severe hot flashes.  Night sweats.  Decrease in sex drive.  Mood swings.  Headaches.  Tiredness.  Irritability.  Memory problems.  Insomnia. Choosing to treat or not to treat menopausal changes is an individual decision that you make with your health care provider. WHAT SHOULD I KNOW ABOUT HORMONE REPLACEMENT THERAPY AND SUPPLEMENTS? Hormone therapy products are effective for treating symptoms that are associated with menopause, such as hot flashes and night sweats. Hormone replacement carries certain risks, especially as you become older. If you are thinking about using estrogen or estrogen with progestin treatments, discuss the benefits and risks with your health care provider. WHAT SHOULD I KNOW ABOUT HEART  DISEASE AND STROKE? Heart disease, heart attack, and stroke become more likely as you age. This may be due, in part, to the hormonal changes that your body experiences during menopause. These can affect how your body processes dietary fats, triglycerides, and cholesterol. Heart attack and stroke are both medical emergencies. There are many things that you can do to help prevent heart disease and stroke:  Have your blood pressure checked at least every 1-2 years. High blood pressure causes heart disease and increases the risk of stroke.  If you are 18-23 years old, ask your health care provider if you should take aspirin to prevent a heart attack or a stroke.  Do not use any tobacco products, including cigarettes, chewing tobacco, or electronic cigarettes. If you need help quitting, ask your health care provider.  It is important to eat a healthy diet and maintain a healthy weight.  Be sure to include plenty of vegetables, fruits, low-fat dairy products,  and lean protein.  Avoid eating foods that are high in solid fats, added sugars, or salt (sodium).  Get regular exercise. This is one of the most important things that you can do for your health.  Try to exercise for at least 150 minutes each week. The type of exercise that you do should increase your heart rate and make you sweat. This is known as moderate-intensity exercise.  Try to do strengthening exercises at least twice each week. Do these in addition to the moderate-intensity exercise.  Know your numbers.Ask your health care provider to check your cholesterol and your blood glucose. Continue to have your blood tested as directed by your health care provider. WHAT SHOULD I KNOW ABOUT CANCER SCREENING? There are several types of cancer. Take the following steps to reduce your risk and to catch any cancer development as early as possible. Breast Cancer  Practice breast self-awareness.  This means understanding how your breasts  normally appear and feel.  It also means doing regular breast self-exams. Let your health care provider know about any changes, no matter how small.  If you are 6 or older, have a clinician do a breast exam (clinical breast exam or CBE) every year. Depending on your age, family history, and medical history, it may be recommended that you also have a yearly breast X-ray (mammogram).  If you have a family history of breast cancer, talk with your health care provider about genetic screening.  If you are at high risk for breast cancer, talk with your health care provider about having an MRI and a mammogram every year.  Breast cancer (BRCA) gene test is recommended for women who have family members with BRCA-related cancers. Results of the assessment will determine the need for genetic counseling and BRCA1 and for BRCA2 testing. BRCA-related cancers include these types:  Breast. This occurs in males or females.  Ovarian.  Tubal. This may also be called fallopian tube cancer.  Cancer of the abdominal or pelvic lining (peritoneal cancer).  Prostate.  Pancreatic. Cervical, Uterine, and Ovarian Cancer Your health care provider may recommend that you be screened regularly for cancer of the pelvic organs. These include your ovaries, uterus, and vagina. This screening involves a pelvic exam, which includes checking for microscopic changes to the surface of your cervix (Pap test).  For women ages 21-65, health care providers may recommend a pelvic exam and a Pap test every three years. For women ages 23-65, they may recommend the Pap test and pelvic exam, combined with testing for human papilloma virus (HPV), every five years. Some types of HPV increase your risk of cervical cancer. Testing for HPV may also be done on women of any age who have unclear Pap test results.  Other health care providers may not recommend any screening for nonpregnant women who are considered low risk for pelvic cancer and  have no symptoms. Ask your health care provider if a screening pelvic exam is right for you.  If you have had past treatment for cervical cancer or a condition that could lead to cancer, you need Pap tests and screening for cancer for at least 20 years after your treatment. If Pap tests have been discontinued for you, your risk factors (such as having a new sexual partner) need to be reassessed to determine if you should start having screenings again. Some women have medical problems that increase the chance of getting cervical cancer. In these cases, your health care provider may recommend that you have screening  and Pap tests more often.  If you have a family history of uterine cancer or ovarian cancer, talk with your health care provider about genetic screening.  If you have vaginal bleeding after reaching menopause, tell your health care provider.  There are currently no reliable tests available to screen for ovarian cancer. Lung Cancer Lung cancer screening is recommended for adults 16-68 years old who are at high risk for lung cancer because of a history of smoking. A yearly low-dose CT scan of the lungs is recommended if you:  Currently smoke.  Have a history of at least 30 pack-years of smoking and you currently smoke or have quit within the past 15 years. A pack-year is smoking an average of one pack of cigarettes per day for one year. Yearly screening should:  Continue until it has been 15 years since you quit.  Stop if you develop a health problem that would prevent you from having lung cancer treatment. Colorectal Cancer  This type of cancer can be detected and can often be prevented.  Routine colorectal cancer screening usually begins at age 39 and continues through age 67.  If you have risk factors for colon cancer, your health care provider may recommend that you be screened at an earlier age.  If you have a family history of colorectal cancer, talk with your health care  provider about genetic screening.  Your health care provider may also recommend using home test kits to check for hidden blood in your stool.  A small camera at the end of a tube can be used to examine your colon directly (sigmoidoscopy or colonoscopy). This is done to check for the earliest forms of colorectal cancer.  Direct examination of the colon should be repeated every 5-10 years until age 25. However, if early forms of precancerous polyps or small growths are found or if you have a family history or genetic risk for colorectal cancer, you may need to be screened more often. Skin Cancer  Check your skin from head to toe regularly.  Monitor any moles. Be sure to tell your health care provider:  About any new moles or changes in moles, especially if there is a change in a mole's shape or color.  If you have a mole that is larger than the size of a pencil eraser.  If any of your family members has a history of skin cancer, especially at a Larena [place] age, talk with your health care provider about genetic screening.  Always use sunscreen. Apply sunscreen liberally and repeatedly throughout the day.  Whenever you are outside, protect yourself by wearing long sleeves, pants, a wide-brimmed hat, and sunglasses. WHAT SHOULD I KNOW ABOUT OSTEOPOROSIS? Osteoporosis is a condition in which bone destruction happens more quickly than new bone creation. After menopause, you may be at an increased risk for osteoporosis. To help prevent osteoporosis or the bone fractures that can happen because of osteoporosis, the following is recommended:  If you are 82-28 years old, get at least 1,000 mg of calcium and at least 600 mg of vitamin D per day.  If you are older than age 4 but younger than age 64, get at least 1,200 mg of calcium and at least 600 mg of vitamin D per day.  If you are older than age 73, get at least 1,200 mg of calcium and at least 800 mg of vitamin D per day. Smoking and excessive  alcohol intake increase the risk of osteoporosis. Eat foods that are rich  in calcium and vitamin D, and do weight-bearing exercises several times each week as directed by your health care provider. WHAT SHOULD I KNOW ABOUT HOW MENOPAUSE AFFECTS [place]? Depression may occur at any age, but it is more common as you become older. Common symptoms of depression include:  Low or sad mood.  Changes in sleep patterns.  Changes in appetite or eating patterns.  Feeling an overall lack of motivation or enjoyment of activities that you previously enjoyed.  Frequent crying spells. Talk with your health care provider if you think that you are experiencing depression. WHAT SHOULD I KNOW ABOUT IMMUNIZATIONS? It is important that you get and maintain your immunizations. These include:  Tetanus, diphtheria, and pertussis (Tdap) booster vaccine.  Influenza every year before the flu season begins.  Pneumonia vaccine.  Shingles vaccine. Your health care provider may also recommend other immunizations.   This information is not intended to replace advice given to you by your health care provider. Make sure you discuss any questions you have with your health care provider.   Document Released: 10/26/2005 Document Revised: 09/24/2014 Document Reviewed: 05/06/2014 Elsevier Interactive Patient Education [employer].

## 2015-12-06 NOTE — Progress Notes (Signed)
Destiny Moody 08/13/1960 [id number]    History:    Presents for annual exam.  Postmenopausal on no HRT with no bleeding. 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 History of a goiter  with normal TSH. Hypertension and diabetes managed by endocrinologist. 2016 T score -2.6 started on Fosamax and has tolerated. 2000 for negative colonoscopy. Has received both Zostavax and Pneumovax. History of RK.  Past medical history, past surgical history, family history and social history were all reviewed and documented in the EPIC chart. 2 children daughter struggling, strained relationship with husband denies infidelity or abuse. Originally from [place].  ROS:  A ROS was performed and pertinent positives and negatives are included.  Exam:  Filed Vitals:   12/06/15 1129  BP: 118/76    General appearance:  Normal Thyroid:  Symmetrical, normal in size, without palpable masses or nodularity. Respiratory  Auscultation:  Clear without wheezing or rhonchi Cardiovascular  Auscultation:  Regular rate, without rubs, murmurs or gallops  Edema/varicosities:  Not grossly evident Abdominal  Soft,nontender, without masses, guarding or rebound.  Liver/spleen:  No organomegaly noted  Hernia:  None appreciated  Skin  Inspection:  Grossly normal   Breasts: Examined lying and sitting.     Right: Lumpectomy scar well-healed, skin discolored from radiation    Left: Without masses, retractions, discharge or axillary adenopathy. Gentitourinary   Inguinal/mons:  Normal without inguinal adenopathy  External genitalia:  Normal  BUS/Urethra/Skene's glands:  Normal  Vagina:  Mild atrophy  Cervix:  Normal  Uterus:   normal in size, shape and contour.  Midline and mobile  Adnexa/parametria:     Rt: Without masses or tenderness.   Lt: Without masses or tenderness.  Anus and perineum: Normal  Digital rectal exam: Normal sphincter tone without palpated masses or  tenderness  Assessment/Plan:  65 y.o. MHF G2 P2 for annual exam.    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 Repeat DEXA next year. Home safety, fall prevention and importance of weightbearing exercise reviewed. SBE's, continue annual mammogram and follow up with oncologist as recommended. Overdue for colonoscopy instructed to schedule with Dr. Collene Mares. Pap normal with negative HR HPV 2015, new screening guidelines reviewed.    [place], 12:00 PM 12/06/2015

## 2015-12-07 ENCOUNTER — Encounter: Payer: BLUE CROSS/BLUE SHIELD | Admitting: Gynecology

## 2016-01-20 ENCOUNTER — Encounter: Payer: BLUE CROSS/BLUE SHIELD | Admitting: Gynecology

## 2016-01-24 ENCOUNTER — Ambulatory Visit (INDEPENDENT_AMBULATORY_CARE_PROVIDER_SITE_OTHER): Payer: BLUE CROSS/BLUE SHIELD | Admitting: Internal Medicine

## 2016-01-24 VITALS — BP 132/62 | HR 92 | Temp 98.5°F | Resp 16 | Ht 62.0 in | Wt 148.6 lb

## 2016-01-24 DIAGNOSIS — J0100 Acute maxillary sinusitis, unspecified: Secondary | ICD-10-CM | POA: Diagnosis not present

## 2016-01-24 MED ORDER — HYDROCODONE-HOMATROPINE 5-1.5 MG/5ML PO SYRP: 5.0000 mL | ORAL_SOLUTION | Freq: Four times a day (QID) | ORAL | Status: DC | PRN

## 2016-01-24 MED ORDER — AMOXICILLIN 875 MG PO TABS: 875.0000 mg | ORAL_TABLET | Freq: Two times a day (BID) | ORAL | Status: DC

## 2016-01-24 NOTE — Progress Notes (Signed)
Subjective:  By signing my name below, I, Moises Blood, attest that this documentation has been prepared under the direction and in the presence of Tami Lin, MD. Electronically Signed: Moises Blood, [place]. 01/24/2016 , 6:35 PM .  Patient was seen in Room 7 .   Patient ID: Destiny Moody, female    DOB: 09-05-1951, 65 y.o.   MRN: [id number] Chief Complaint  Patient presents with  . Cough    X Saturday  . Nasal Congestion    X Saturday   HPI Destiny Moody is a 65 y.o. female who presents to [facility] complaining of cough and nasal congestion with sinus headache that started 3 days ago. She's been blowing out some mucus. She informs that she's coughing a lot today with shortness of breath. She reports having subjective fever and feeling chills. She's had pneumonia in the past. She's had taken OTC medications without relief.   She also asked about diabetes medication.   Patient Active Problem List   Diagnosis Date Noted  . Genetic testing 04/27/2015  . Family history of colon cancer   . Family history of ovarian cancer   . Diabetes mellitus type II, non insulin dependent ([place]) 03/31/2015  . Abscess of axilla, right 11/24/2014  . Breast cancer of upper-inner quadrant of right female breast ([place]) 08/26/2014  . Osteoporosis 08/10/2014  . Vaginal atrophy 04/27/2014  . RLQ PAIN 04/08/2008  . GERD 04/07/2008  . FATTY LIVER DISEASE 04/07/2008    Current outpatient prescriptions:  .  alendronate (FOSAMAX) 70 MG tablet, Take 1 tablet (70 mg total) by mouth once a week. Take with a full glass of water on an empty stomach., Disp: 12 tablet, Rfl: 4 .  amitriptyline (ELAVIL) 25 MG tablet, TAKE ONE TABLET BY MOUTH AT BEDTIME  "OFFICE VISIT NEEDED", Disp: 90 tablet, Rfl: 3 .  aspirin 81 MG tablet, Take 81 mg by mouth daily., Disp: , Rfl:  .  Calcium Carbonate-Vitamin D 600-200 MG-UNIT TABS, Take 1,200 mg by mouth., Disp: , Rfl:  .  glimepiride (AMARYL) 2 MG tablet, TAKE ONE TABLET BY MOUTH  ONCE DAILY BEFORE BREAKFAST., Disp: 90 tablet, Rfl: 3 .  Glucosamine 500 MG CAPS, Take 1 capsule by mouth daily., Disp: , Rfl:  .  lactobacillus acidophilus (BACID) TABS tablet, Take 2 tablets by mouth 3 (three) times daily., Disp: , Rfl:  .  lisinopril (PRINIVIL,ZESTRIL) 2.5 MG tablet, Take 1 tablet (2.5 mg total) by mouth daily., Disp: 90 tablet, Rfl: 3 .  metFORMIN (GLUCOPHAGE-XR) 500 MG 24 hr tablet, Take 2 tablets (1,000 mg total) by mouth 2 (two) times daily., Disp: 360 tablet, Rfl: 3 .  Multiple Vitamin (MULTIVITAMIN) capsule, Take 1 capsule by mouth daily., Disp: , Rfl:  .  pioglitazone (ACTOS) 30 MG tablet, TAKE ONE TABLET BY MOUTH ONCE DAILY, Disp: 90 tablet, Rfl: 3 .  simvastatin (ZOCOR) 20 MG tablet, Take 1 tablet (20 mg total) by mouth at bedtime., Disp: 90 tablet, Rfl: 3 .  tamoxifen (NOLVADEX) 20 MG tablet, Take 1 tablet (20 mg total) by mouth daily., Disp: 90 tablet, Rfl: 4 .  TURMERIC PO, Take 1 capsule by mouth daily., Disp: , Rfl:  .  Exenatide ER 2 MG PEN, Inject 2 mg into the skin once a week. (Patient not taking: Reported on 01/24/2016), Disp: 1 each, Rfl: 12 .  ibuprofen (ADVIL,MOTRIN) 200 MG tablet, Take 200 mg by mouth every 6 (six) hours as needed. Reported on 01/24/2016, Disp: , Rfl:  .  naproxen sodium (  ANAPROX DS) 550 MG tablet, Take 1 tablet (550 mg total) by mouth 2 (two) times daily with a meal. (Patient not taking: Reported on 01/24/2016), Disp: 40 tablet, Rfl: 0 .  ondansetron (ZOFRAN ODT) 4 MG disintegrating tablet, Take 1 tablet (4 mg total) by mouth every 6 (six) hours as needed for nausea or vomiting. Take 15 min before food/water. Drink lots of fluid. (Patient not taking: Reported on 01/24/2016), Disp: 20 tablet, Rfl: 0 Allergies  Allergen Reactions  . Shellfish Allergy Hives   Review of Systems  Constitutional: Positive for fever, chills and fatigue.  HENT: Positive for congestion, rhinorrhea and sinus pressure. Negative for sore throat.   Respiratory: Positive  for cough and shortness of breath. Negative for wheezing.   Gastrointestinal: Negative for nausea and vomiting.  Neurological: Positive for headaches.       Objective:   Physical Exam  Constitutional: She is oriented to person, place, and time. She appears well-developed and well-nourished. No distress.  HENT:  Head: Normocephalic and atraumatic.  Nose: Rhinorrhea (purulent discharge) present.  Mouth/Throat: Oropharynx is clear and moist.  Eyes: EOM are normal. Pupils are equal, round, and reactive to light.  Neck: Neck supple.  Cardiovascular: Normal rate, regular rhythm and normal heart sounds.   No murmur heard. Pulmonary/Chest: Effort normal and breath sounds normal. No respiratory distress. She has no wheezes.  Musculoskeletal: Normal range of motion.  Lymphadenopathy:    She has no cervical adenopathy.  Neurological: She is alert and oriented to person, place, and time.  Skin: Skin is warm and dry.  Psychiatric: She has a normal mood and affect. Her behavior is normal.  Nursing note and vitals reviewed.   BP 132/62 mmHg  Pulse 92  Temp(Src) 98.5 F (36.9 C) (Oral)  Resp 16  Ht 5\' 2"  (1.575 m)  Wt 148 lb 9.6 oz (67.405 kg)  BMI 27.17 kg/m2  SpO2 97%    Assessment & Plan:  Acute maxillary sinusitis, recurrence not specified  Meds ordered this encounter  Medications  . amoxicillin (AMOXIL) 875 MG tablet    Sig: Take 1 tablet (875 mg total) by mouth 2 (two) times daily.    Dispense:  20 tablet    Refill:  0  . HYDROcodone-homatropine (HYCODAN) 5-1.5 MG/5ML syrup    Sig: Take 5 mLs by mouth every 6 (six) hours as needed.    Dispense:  120 mL    Refill:  0   I have completed the patient encounter in its entirety as documented by the scribe, with editing by me where necessary. Makena Murdock P. Laney Pastor, M.D.

## 2016-01-24 NOTE — Patient Instructions (Signed)
     IF you received an x-ray today, you will receive an invoice from [facility]. Please contact  Radiology at [phone] with questions or concerns regarding your invoice.   IF you received labwork today, you will receive an invoice from [facility]. Please contact [facility] at [phone] with questions or concerns regarding your invoice.   Our billing staff will not be able to assist you with questions regarding bills from these companies.  You will be contacted with the lab results as soon as they are available. The fastest way to get your results is to activate your My Chart account. Instructions are located on the last page of this paperwork. If you have not heard from us regarding the results in 2 weeks, please contact this office.      

## 2016-02-09 ENCOUNTER — Telehealth: Payer: Self-pay | Admitting: Oncology

## 2016-02-09 ENCOUNTER — Ambulatory Visit (HOSPITAL_BASED_OUTPATIENT_CLINIC_OR_DEPARTMENT_OTHER): Payer: BLUE CROSS/BLUE SHIELD | Admitting: Oncology

## 2016-02-09 VITALS — BP 134/60 | HR 76 | Temp 98.7°F | Resp 18 | Ht 62.0 in | Wt 149.6 lb

## 2016-02-09 DIAGNOSIS — N63 Unspecified lump in breast: Secondary | ICD-10-CM | POA: Diagnosis not present

## 2016-02-09 DIAGNOSIS — C50211 Malignant neoplasm of upper-inner quadrant of right female breast: Secondary | ICD-10-CM | POA: Diagnosis not present

## 2016-02-09 DIAGNOSIS — Z79810 Long term (current) use of selective estrogen receptor modulators (SERMs): Secondary | ICD-10-CM | POA: Diagnosis not present

## 2016-02-09 MED ORDER — TAMOXIFEN CITRATE 20 MG PO TABS: 20.0000 mg | ORAL_TABLET | Freq: Every day | ORAL | Status: DC

## 2016-02-09 NOTE — Telephone Encounter (Signed)
appt made and avs printed °

## 2016-02-09 NOTE — Progress Notes (Signed)
[facility]  [phone] [phone]     ID: Valicia Fehrenbach DOB: 07/22/1951  MR#: [id number]  CSN#:[id number]  Patient Care Team: Jeffrey R Greene, MD as PCP - General (Family Medicine) Juan H Fernandez, MD as Consulting Physician (Gynecology) Haywood Ingram, MD as Consulting Physician (General Surgery) Stacy Wentworth, MD as Consulting Physician (Radiation Oncology) Gretchen W Dawson, NP as Nurse Practitioner (Nurse Practitioner) Gustav C Magrinat, MD as Consulting Physician (Oncology) Heather Thompson Mackey, NP as Nurse Practitioner (Nurse Practitioner) PCP: GREENE,JEFFREY R, MD OTHER MD:  CHIEF COMPLAINT: Estrogen receptor positive breast cancer  CURRENT TREATMENT:  tamoxifen   BREAST CANCER HISTORY: From Dr. Feng's intake note dated 01/17/2015:  "She had abnormal screening mammogram in September 2015, underwent biopsy on 06/15/2014 which was negative for malignancy. She developed I small hematoma after biopsy, which required drainage afterwards. She finally had a bilateral breast MRI , which showed a 1.7 cm non-mass enhancement in the medial right breast. She had repeated mammogram on 08/19/2014 which showed again non-mass like enhancement in the right breast which was biopsied before. 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 She tolerates the biopsy well without any complications "  Her subsequent history is as detailed below.  INTERVAL HISTORY:  Destiny Moody returns today for follow-up of her breast cancer. He continues on tamoxifen,generally with good tolerance.She takes it in the evening and sometimes she can have moderate hot flashes at night. She developed a little bit of vaginal wetness but she says "that's actually a good thing". She obtains it at a good price.  REVIEW OF SYSTEMS:  Destiny Moody had multiple complaints today including feeling chills, insomnia, moderate fatigue, some  joint pains here and there which are however not more persistent or intense than before,  Vaginal itching/ irritation, sinus problems, sore throat, requenturinary tract infections, and poorly controlled blood sugar. What is really  On her mind however is discomfort and "a bump that is growing"in her right breast is right axilla area. She has a history of depression but she says that has totally dissipated since her 63-year-old daughter completed her university tests and got engaged.  A detailed review of systems today was otherwise noncontributory.  PAST MEDICAL HISTORY: Past Medical History  Diagnosis Date  . Hypothyroidism   . Rheumatoid arthritis(714.0)   . Osteopenia   . NIDDM (non-insulin dependent diabetes mellitus)   . Multinodular thyroid   . Hypercholesterolemia   . HSV infection   . Osteoporosis 2015  . Cancer (HCC)   . Wears contact lenses   . Breast cancer (HCC) 08/24/14    right, upper inner  . Family history of colon cancer   . Family history of ovarian cancer   . Radiation 01/31/15-03/01/15    right  breast    PAST SURGICAL HISTORY: Past Surgical History  Procedure Laterality Date  . Cholecystectomy  1993  . Tubal ligation  1996    re annastomosis  . Cesarean section  1980,1984  . Colonoscopy    . Radioactive seed guided mastectomy with axillary sentinel lymph node biopsy Right 10/12/2014    Procedure: RIGHT  PARTIAL MASTECTOMY WITH RADIOACTIVE SEED LOCALIZATION  RIGHT  AXILLARY SENTINEL  NODE BIOPSY;  Surgeon: Haywood Ingram, MD;  Location: [facility];  Service: General;  Laterality: Right;  . Incision and drainage abscess Right 11/24/2014    Procedure: INCISION AND DRAINAGE RIGHT AXILLA  ABSCESS;  Surgeon: Haywood Ingram, MD;  Location:   [facility] OR;  Service: General;  Laterality: Right;    FAMILY HISTORY Family History  Problem Relation Age of Onset  . Diabetes Mother     niddm  . Heart disease Mother   . Heart failure Father   . Heart disease Father     . Ovarian cancer Cousin 37  . Hyperlipidemia Sister   . Colon cancer Maternal Uncle 70   the patient's father died from a myocardial infarction at age 70. The patient's mother died at age 64 from complications of diabetes. The patient had no brothers, one sister. There is no history of cancer in the immediate family but the patient has one cousin on the mother's side diagnosed with ovarian cancer before the age of 40. Also that cousin's father, the patient's mother's brother, was diagnosed with colon cancer at age 75  GYNECOLOGIC HISTORY:  No LMP recorded. Patient is postmenopausal. Menarche age 12, first live birth age 28. She is GX P2. She went through the change of life approximately age 42. 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 Her husband Arnoldo is in the rental business. The patient's daughter Dennyh works at the [employer] in [place], and daughter Natalia works at [employer]. The patient has 2 grandchildren. She attends [employer]    ADVANCED DIRECTIVES: Not in place   HEALTH MAINTENANCE: Social History  Substance Use Topics  . Smoking status: Never Smoker   . Smokeless tobacco: Never Used  . Alcohol Use: 0.0 oz/week    0 Standard drinks or equivalent per week     Comment: occasionally     Colonoscopy: Age 52  PAP: Up-to-date  Bone density: Osteoporosis  Lipid panel:  Allergies  Allergen Reactions  . Shellfish Allergy Hives    Current Outpatient Prescriptions  Medication Sig Dispense Refill  . alendronate (FOSAMAX) 70 MG tablet Take 1 tablet (70 mg total) by mouth once a week. Take with a full glass of water on an empty stomach. 12 tablet 4  . amitriptyline (ELAVIL) 25 MG tablet TAKE ONE TABLET BY MOUTH AT BEDTIME  "OFFICE VISIT NEEDED" 90 tablet 3  . amoxicillin (AMOXIL) 875 MG tablet Take 1 tablet (875 mg total) by mouth 2 (two) times daily. 20 tablet 0  .  aspirin 81 MG tablet Take 81 mg by mouth daily.    . Calcium Carbonate-Vitamin D 600-200 MG-UNIT TABS Take 1,200 mg by mouth.    . Exenatide ER 2 MG PEN Inject 2 mg into the skin once a week. (Patient not taking: Reported on 01/24/2016) 1 each 12  . glimepiride (AMARYL) 2 MG tablet TAKE ONE TABLET BY MOUTH ONCE DAILY BEFORE BREAKFAST. 90 tablet 3  . Glucosamine 500 MG CAPS Take 1 capsule by mouth daily.    . HYDROcodone-homatropine (HYCODAN) 5-1.5 MG/5ML syrup Take 5 mLs by mouth every 6 (six) hours as needed. 120 mL 0  . ibuprofen (ADVIL,MOTRIN) 200 MG tablet Take 200 mg by mouth every 6 (six) hours as needed. Reported on 01/24/2016    . lactobacillus acidophilus (BACID) TABS tablet Take 2 tablets by mouth 3 (three) times daily.    . lisinopril (PRINIVIL,ZESTRIL) 2.5 MG tablet Take 1 tablet (2.5 mg total) by mouth daily. 90 tablet 3  . metFORMIN (GLUCOPHAGE-XR) 500 MG 24 hr tablet Take 2 tablets (1,000 mg total) by mouth 2 (two) times daily. 360 tablet 3  . Multiple Vitamin (MULTIVITAMIN) capsule   Take 1 capsule by mouth daily.    . naproxen sodium (ANAPROX DS) 550 MG tablet Take 1 tablet (550 mg total) by mouth 2 (two) times daily with a meal. (Patient not taking: Reported on 01/24/2016) 40 tablet 0  . ondansetron (ZOFRAN ODT) 4 MG disintegrating tablet Take 1 tablet (4 mg total) by mouth every 6 (six) hours as needed for nausea or vomiting. Take 15 min before food/water. Drink lots of fluid. (Patient not taking: Reported on 01/24/2016) 20 tablet 0  . pioglitazone (ACTOS) 30 MG tablet TAKE ONE TABLET BY MOUTH ONCE DAILY 90 tablet 3  . simvastatin (ZOCOR) 20 MG tablet Take 1 tablet (20 mg total) by mouth at bedtime. 90 tablet 3  . tamoxifen (NOLVADEX) 20 MG tablet Take 1 tablet (20 mg total) by mouth daily. 90 tablet 4  . TURMERIC PO Take 1 capsule by mouth daily.     No current facility-administered medications for this visit.    OBJECTIVE: Middle-aged Latin American woman In no acute distress Filed  Vitals:   02/09/16 1522  BP: 134/60  Pulse: 76  Temp: 98.7 F (37.1 C)  Resp: 18     Body mass index is 27.36 kg/(m^2).    ECOG FS:1 - Symptomatic but completely ambulatory  Sclerae unicteric, EOMs intact Oropharynx clear, no thrush or other lesions No cervical or supraclavicular adenopathy Lungs no rales or rhonchi Heart regular rate and rhythm Abd soft, nontender, positive bowel sounds, no masses palpated MSK no focal spinal tenderness, no upper extremity lymphedema Neuro: nonfocal, well oriented, appropriate affect Breasts: the right breast is status post lumpectomy and radiation. In thelateral aspect of the breast,almost into the axilla, there is a  1-1-1/2 cm masswhich is subcutaneous, movable, nontender, and not erythematous.This is not clearly associated with her lumpectomy incisions. Otherwise the right breast shows no skin or nipple changes of concern in the right axilla is benign. The left breast is unremarkable.   LAB RESULTS:  CMP     Component Value Date/Time   NA 135 09/27/2015 2045   NA 137 08/15/2015 0950   K 3.9 09/27/2015 2045   K 4.2 08/15/2015 0950   CL 101 09/27/2015 2045   CO2 23 09/27/2015 2045   CO2 22 08/15/2015 0950   GLUCOSE 183* 09/27/2015 2045   GLUCOSE 219* 08/15/2015 0950   BUN 15 09/27/2015 2045   BUN 19.2 08/15/2015 0950   CREATININE 0.73 09/27/2015 2045   CREATININE 0.8 08/15/2015 0950   CREATININE 0.70 11/24/2014 0724   CALCIUM 8.9 09/27/2015 2045   CALCIUM 9.2 08/15/2015 0950   PROT 7.0 09/27/2015 2045   PROT 7.4 08/15/2015 0950   ALBUMIN 4.1 09/27/2015 2045   ALBUMIN 3.7 08/15/2015 0950   AST 34 09/27/2015 2045   AST 25 08/15/2015 0950   ALT 46* 09/27/2015 2045   ALT 35 08/15/2015 0950   ALKPHOS 49 09/27/2015 2045   ALKPHOS 67 08/15/2015 0950   BILITOT 0.6 09/27/2015 2045   BILITOT 0.43 08/15/2015 0950   GFRNONAA 89 09/27/2015 2045   GFRNONAA >90 11/24/2014 0724   GFRAA >89 09/27/2015 2045   GFRAA >90 11/24/2014 0724     INo results found for: SPEP, UPEP  Lab Results  Component Value Date   WBC 6.2 10/18/2015   NEUTROABS 5.1 08/15/2015   HGB 13.4 10/18/2015   HCT 40.6 10/18/2015   MCV 92.5 10/18/2015   PLT 262 10/18/2015      Chemistry      Component Value Date/Time  NA 135 09/27/2015 2045   NA 137 08/15/2015 0950   K 3.9 09/27/2015 2045   K 4.2 08/15/2015 0950   CL 101 09/27/2015 2045   CO2 23 09/27/2015 2045   CO2 22 08/15/2015 0950   BUN 15 09/27/2015 2045   BUN 19.2 08/15/2015 0950   CREATININE 0.73 09/27/2015 2045   CREATININE 0.8 08/15/2015 0950   CREATININE 0.70 11/24/2014 0724      Component Value Date/Time   CALCIUM 8.9 09/27/2015 2045   CALCIUM 9.2 08/15/2015 0950   ALKPHOS 49 09/27/2015 2045   ALKPHOS 67 08/15/2015 0950   AST 34 09/27/2015 2045   AST 25 08/15/2015 0950   ALT 46* 09/27/2015 2045   ALT 35 08/15/2015 0950   BILITOT 0.6 09/27/2015 2045   BILITOT 0.43 08/15/2015 0950       No results found for: LABCA2  No components found for: LABCA125  No results for input(s): INR in the last 168 hours.  Urinalysis    Component Value Date/Time   COLORURINE YELLOW 04/19/2008 1937   APPEARANCEUR CLOUDY* 04/19/2008 1937   LABSPEC 1.011 04/19/2008 1937   PHURINE 6.5 04/19/2008 1937   GLUCOSEU NEGATIVE 04/19/2008 1937   HGBUR MODERATE* 04/19/2008 1937   BILIRUBINUR negative 09/27/2015 2103   BILIRUBINUR NEGATIVE 04/19/2008 1937   KETONESUR trace (5)* 09/27/2015 2103   KETONESUR TRACE* 04/19/2008 1937   PROTEINUR negative 09/27/2015 2103   PROTEINUR 30* 04/19/2008 1937   UROBILINOGEN 0.2 09/27/2015 2103   UROBILINOGEN 0.2 04/19/2008 1937   NITRITE Negative 09/27/2015 2103   NITRITE NEGATIVE 04/19/2008 1937   LEUKOCYTESUR Negative 09/27/2015 2103    STUDIES: CLINICAL DATA: 65 year old female with right lower quadrant abdominal pain.  EXAM: CT ABDOMEN AND PELVIS WITH CONTRAST  TECHNIQUE: Multidetector CT imaging of the abdomen and pelvis was  performed using the standard protocol following bolus administration of intravenous contrast.  CONTRAST: 62m OMNIPAQUE IOHEXOL 300 MG/ML SOLN, 1017mOMNIPAQUE IOHEXOL 300 MG/ML SOLN  COMPARISON: CT dated 04/19/2008  FINDINGS: The visualized lung bases are clear. There is coronary vascular calcification. No intra-abdominal free air or free fluid.  Cholecystectomy. The liver, pancreas, spleen, adrenal glands, kidneys, visualized ureters, and urinary bladder appear unremarkable. The uterus and the ovaries are grossly unremarkable.  There is apparent soft tissue prominence of the posterior wall of the rectum (series 2, image 81). This is similar to the study dated 04/19/2008 and likely represent rectal mucosa fold. There is no evidence of bowel obstruction or inflammation. Normal appendix.  There is aortoiliac atherosclerotic disease. The abdominal aorta and IVC appear patent. No portal venous gas identified. There is no adenopathy. Small stable infraumbilical fat containing hernia with mild stranding of the herniated fat similar to prior study. No fluid collection. Midline vertical anterior pelvic wall incisional scar. The abdominal wall soft tissues are otherwise unremarkable. There is degenerative changes of the spine. The osseous structures are otherwise intact.  IMPRESSION: No acute intra-abdominal pelvic pathology.   Electronically Signed  By: ArAnner Crete.D.  On: 09/28/2015 00:38   ASSESSMENT: [age].o. BRCA negative [place] woman status post right breast upper inner quadrant biopsy 08/24/2014 for a clinical T1c N0, stage IA invasive ductal carcinoma, grade 1 or 2, strongly estrogen and progesterone receptor positive, with an MIB-1 of 12%, and no HER-2 amplification  (1) status post right lumpectomy and sentinel lymph node sampling 10/12/2014 for a pT1c pN0, stage IA invasive ductal carcinoma, grade 1, with ample margins; repeat HER-2 again  negative  (2) Adjuvant radiation  01/31/2015-03/01/2015:  Right breast/ 42.72 Gy at 2.67 Gy per fraction x 21 fractions.  Right breast boost/ 10 Gy at 2 Gy per fraction x 5 fractions  (3) started tamoxifen 05/19/2015  (4) genetic testing 04/26/2015 through the [employer] gene panel offered by [facility] found no deleterious mutations in ATM, BARD1, BRCA1, BRCA2, BRIP1, CDH1, CHEK2, EPCAM, MLH1, MRE11A, MSH2, MSH6, MUTYH, NBN, NF1, PALB2, PMS2, PTEN, RAD50, RAD51C, RAD51D, SMARCA4, STK11, and TP53.   PLAN: Destiny Moody continues on tamoxifen, with excellent tolerance and the plan will be to continue that for a minimum of 5 years.  She does have a palpable subcutaneous mass in the right lateral chestbreast area, and even though she had mammography and ultrasonography of this area November 2016 this warrants further evaluation. I am setting her up for repeat right mammogram and right ultrasonography within the week.  Also she was supposed to have had lab work prior to today's visit. We will draw those tomorrow.  Fiinally, Destiny Moody is having some gynecological problems which she will bring to Dr. Fernandez' attention  If the mammogram, ultrasound, and labs are all normal, I will see her again in December, and then we will go to yearly from that visit.Of course if there is any other abnormality I would see her before that.  She knows to call for any problems that may develop before then.  MAGRINAT,GUSTAV C, MD   02/09/2016 3:43 PM Medical Oncology and Hematology [facility] [address] , [place] [address] Tel. [phone]    Fax. [phone]  

## 2016-02-10 ENCOUNTER — Other Ambulatory Visit (HOSPITAL_BASED_OUTPATIENT_CLINIC_OR_DEPARTMENT_OTHER): Payer: BLUE CROSS/BLUE SHIELD

## 2016-02-10 DIAGNOSIS — C50411 Malignant neoplasm of upper-outer quadrant of right female breast: Secondary | ICD-10-CM

## 2016-02-10 DIAGNOSIS — C50211 Malignant neoplasm of upper-inner quadrant of right female breast: Secondary | ICD-10-CM

## 2016-02-10 LAB — CBC WITH DIFFERENTIAL/PLATELET
BASO%: 0.6 % (ref 0.0–2.0)
Basophils Absolute: 0 10*3/uL (ref 0.0–0.1)
EOS%: 1.9 % (ref 0.0–7.0)
Eosinophils Absolute: 0.1 10*3/uL (ref 0.0–0.5)
HEMATOCRIT: 39.1 % (ref 34.8–46.6)
HEMOGLOBIN: 13 g/dL (ref 11.6–15.9)
LYMPH#: 1.9 10*3/uL (ref 0.9–3.3)
LYMPH%: 31.9 % (ref 14.0–49.7)
MCH: 30.7 pg (ref 25.1–34.0)
MCHC: 33.2 g/dL (ref 31.5–36.0)
MCV: 92.6 fL (ref 79.5–101.0)
MONO#: 0.5 10*3/uL (ref 0.1–0.9)
MONO%: 7.9 % (ref 0.0–14.0)
NEUT%: 57.7 % (ref 38.4–76.8)
NEUTROS ABS: 3.4 10*3/uL (ref 1.5–6.5)
Platelets: 210 10*3/uL (ref 145–400)
RBC: 4.22 10*6/uL (ref 3.70–5.45)
RDW: 12.9 % (ref 11.2–14.5)
WBC: 5.9 10*3/uL (ref 3.9–10.3)

## 2016-02-10 LAB — COMPREHENSIVE METABOLIC PANEL
ALBUMIN: 3.7 g/dL (ref 3.5–5.0)
ALK PHOS: 53 U/L (ref 40–150)
ALT: 29 U/L (ref 0–55)
AST: 23 U/L (ref 5–34)
Anion Gap: 10 mEq/L (ref 3–11)
BUN: 14.1 mg/dL (ref 7.0–26.0)
CALCIUM: 9.3 mg/dL (ref 8.4–10.4)
CO2: 22 mEq/L (ref 22–29)
CREATININE: 0.8 mg/dL (ref 0.6–1.1)
Chloride: 107 mEq/L (ref 98–109)
EGFR: 76 mL/min/{1.73_m2} — ABNORMAL LOW (ref >90)
GLUCOSE: 205 mg/dL — AB (ref 70–140)
Potassium: 4.4 mEq/L (ref 3.5–5.1)
Sodium: 138 mEq/L (ref 136–145)
Total Bilirubin: 0.53 mg/dL (ref 0.20–1.20)
Total Protein: 7.1 g/dL (ref 6.4–8.3)

## 2016-02-11 LAB — CANCER ANTIGEN 27.29: CA 27.29: 12.2 U/mL (ref 0.0–38.6)

## 2016-02-16 ENCOUNTER — Ambulatory Visit
Admission: RE | Admit: 2016-02-16 | Discharge: 2016-02-16 | Disposition: A | Discharge status: Home / Self Care | Payer: BLUE CROSS/BLUE SHIELD | Source: Ambulatory Visit | Attending: Oncology | Admitting: Oncology

## 2016-02-16 DIAGNOSIS — C50211 Malignant neoplasm of upper-inner quadrant of right female breast: Secondary | ICD-10-CM

## 2016-04-02 ENCOUNTER — Encounter: Payer: Self-pay | Admitting: Gastroenterology

## 2016-05-14 ENCOUNTER — Other Ambulatory Visit: Payer: Self-pay | Admitting: Family Medicine

## 2016-05-14 DIAGNOSIS — J309 Allergic rhinitis, unspecified: Secondary | ICD-10-CM

## 2016-06-01 ENCOUNTER — Telehealth: Payer: Self-pay | Admitting: Gastroenterology

## 2016-06-01 ENCOUNTER — Ambulatory Visit: Payer: BLUE CROSS/BLUE SHIELD | Admitting: *Deleted

## 2016-06-01 VITALS — Ht 62.0 in | Wt 149.8 lb

## 2016-06-01 DIAGNOSIS — Z1211 Encounter for screening for malignant neoplasm of colon: Secondary | ICD-10-CM

## 2016-06-01 MED ORDER — SUPREP BOWEL PREP KIT 17.5-3.13-1.6 GM/177ML PO SOLN: 1.0000 | Freq: Once | ORAL | 0 refills | Status: AC

## 2016-06-01 NOTE — Progress Notes (Signed)
Patient denies any allergies to egg or soy products. Patient denies complications with anesthesia/sedation.  Patient denies oxygen use at home and denies diet medications. Emmi instructions for colonoscopy  Explained.  Pamphlet given to patient.

## 2016-06-01 NOTE — Telephone Encounter (Signed)
Spoke to pt. Informed her the office sent a coupon to the pharmacy to help with the cost.

## 2016-06-05 ENCOUNTER — Encounter: Payer: Self-pay | Admitting: Gastroenterology

## 2016-06-12 ENCOUNTER — Telehealth: Payer: Self-pay | Admitting: Gastroenterology

## 2016-06-12 NOTE — Telephone Encounter (Signed)
Pt has questions about which medicines to take before her procedure - instructed she Lucianne Lei take her tamoxifen and her lisinopril, no oral diabetes medicines the day of the colon.  She then had questions about Thursday's clear liquid diet- she asked if she can have milk with bananas for breakfast - instructed to her no milk or milk products, no banana because this is a solid- only clear liquids and the clear liquid list discussed with pt again. Informed her to call with more questions   Lelan Pons PV

## 2016-06-13 IMAGING — CT CT ABD-PELV W/ CM
2 of 5 series · 16 of 46 positions shown, 18 images · IV contrast (omnipaque)
Comparison: CT dated 04/19/2008

CLINICAL DATA: 64-year-old female with right lower quadrant
abdominal pain.

EXAM:
CT ABDOMEN AND PELVIS WITH CONTRAST
TECHNIQUE: Multidetector CT imaging of the abdomen and pelvis was performed
using the standard protocol following bolus administration of
intravenous contrast.
CONTRAST:  50mL OMNIPAQUE IOHEXOL 300 MG/ML SOLN, 100mL OMNIPAQUE
IOHEXOL 300 MG/ML SOLN

[Series 2: abd/pel with · axial · 0.69mm/px · z∈[+1016,+1412]mm · 13 of 89 slices shown, 15 images]
[im 5/89  soft-tissue]
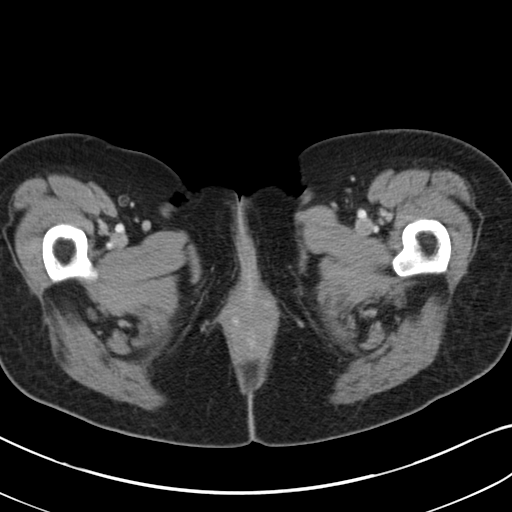
[im 5/89  bone]
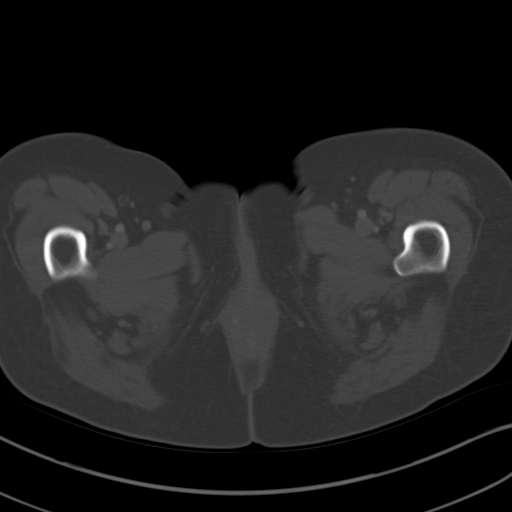
[im 14/89  soft-tissue]
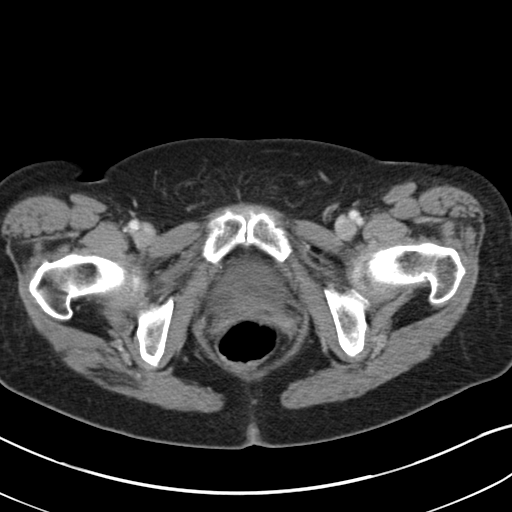
[im 18/89  soft-tissue]
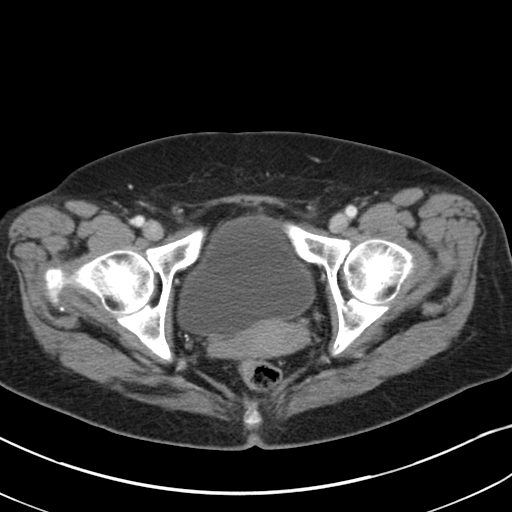
[im 27/89  soft-tissue]
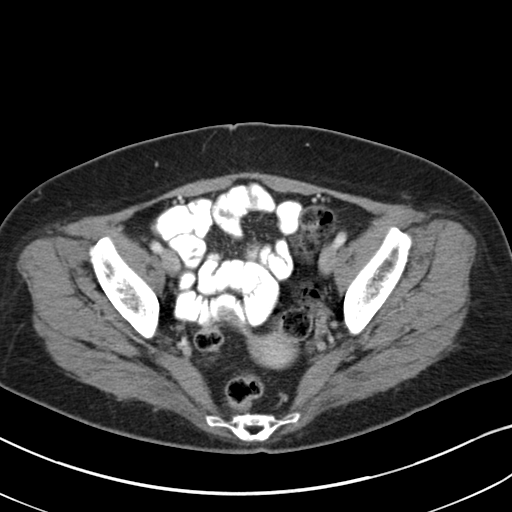
[im 31/89  soft-tissue]
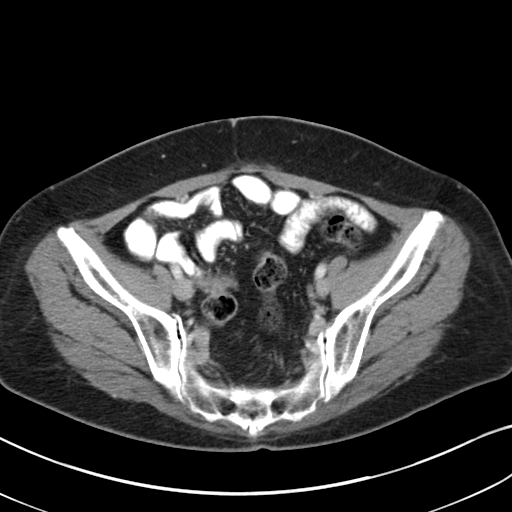
[im 40/89  soft-tissue]
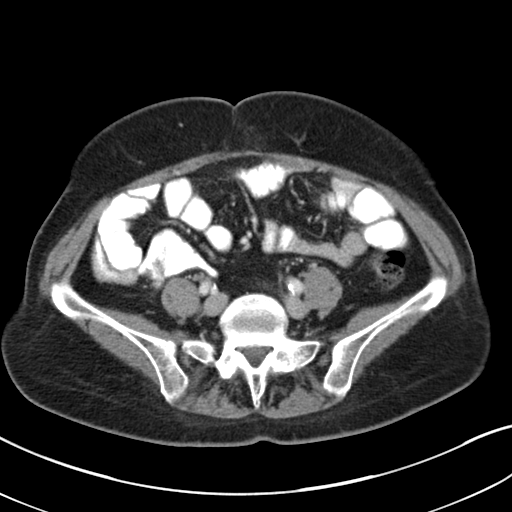
[im 45/89  soft-tissue]
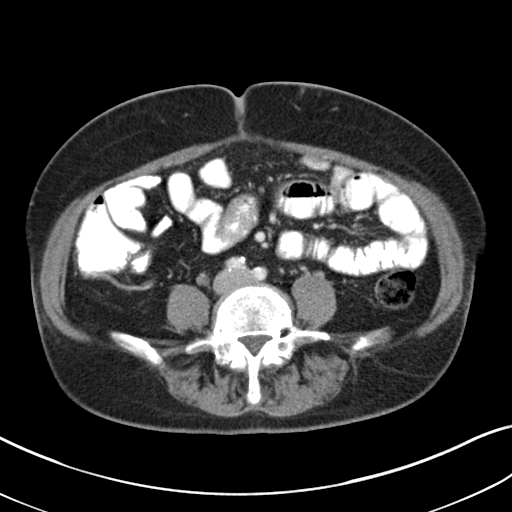
[im 49/89  soft-tissue]
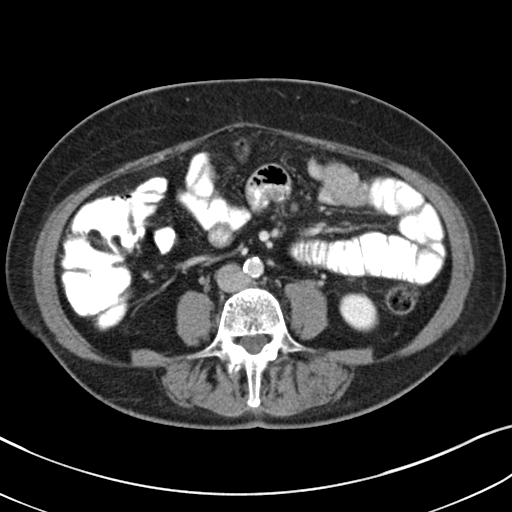
[im 58/89  soft-tissue]
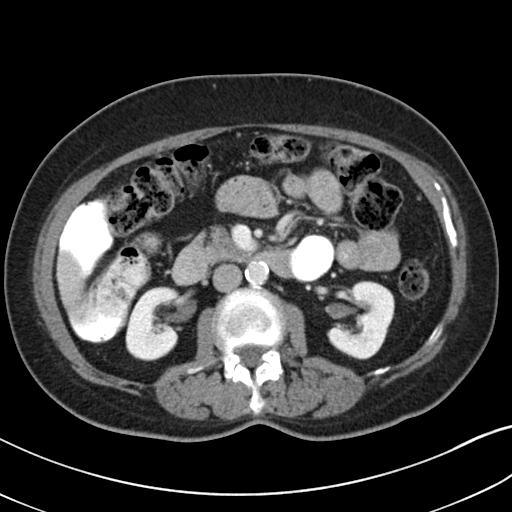
[im 58/89  bone]
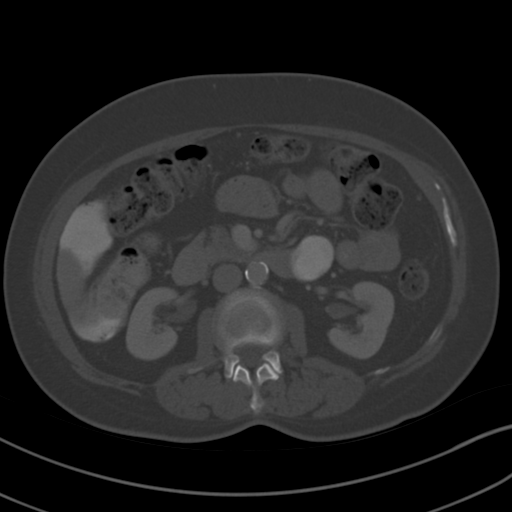
[im 62/89  soft-tissue]
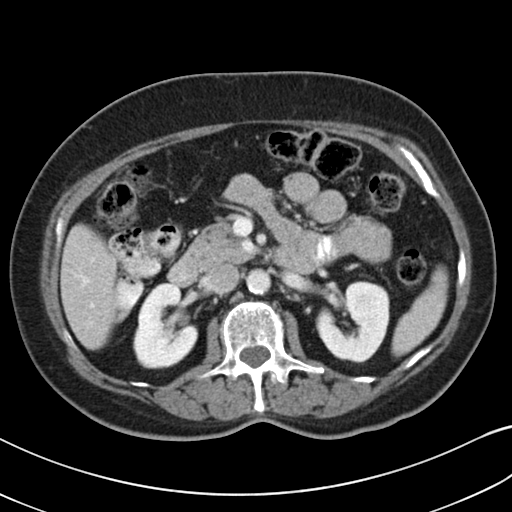
[im 71/89  soft-tissue]
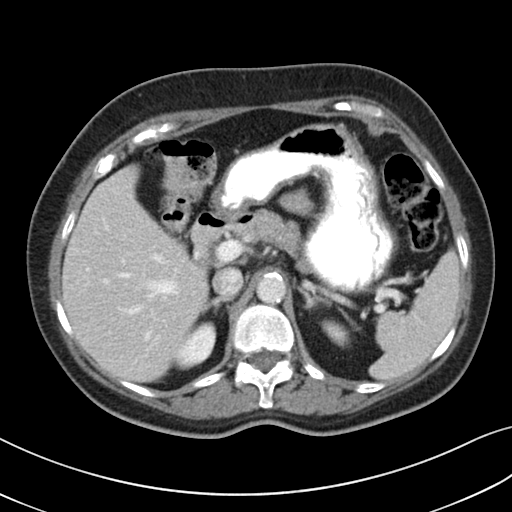
[im 75/89  soft-tissue]
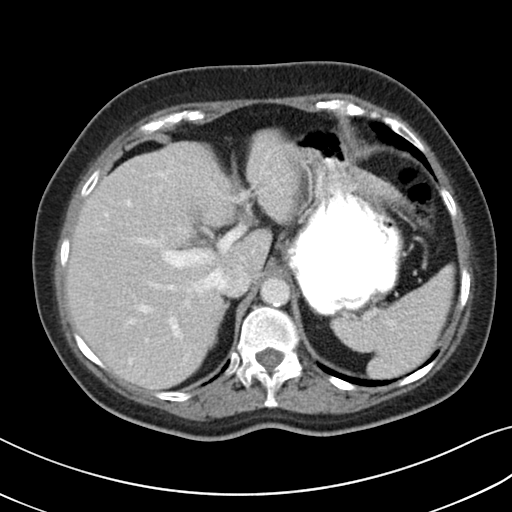
[im 84/89  soft-tissue]
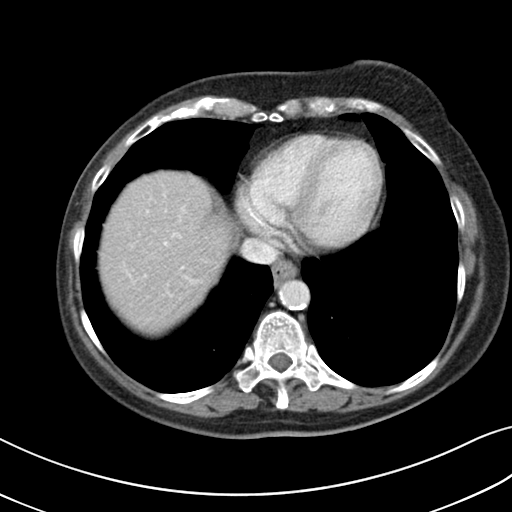

[Series 5: coronal a/|p · coronal · 0.73mm/px · 3 of 89 slices shown]
[im 30/89  soft-tissue]
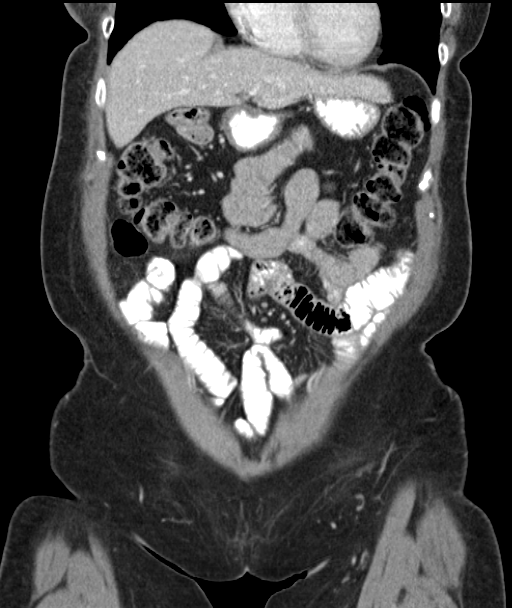
[im 40/89  soft-tissue]
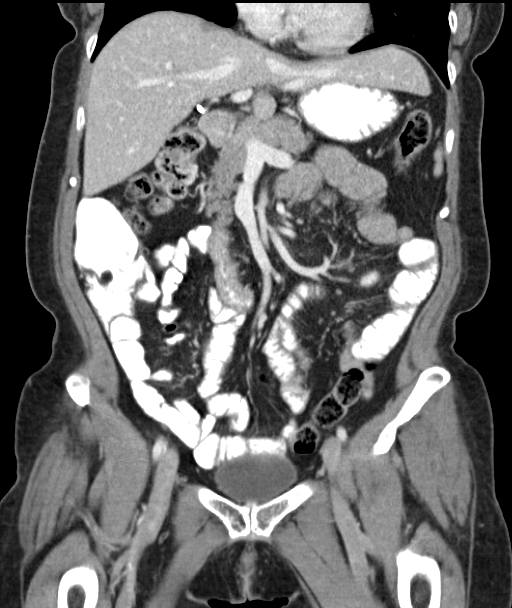
[im 49/89  soft-tissue]
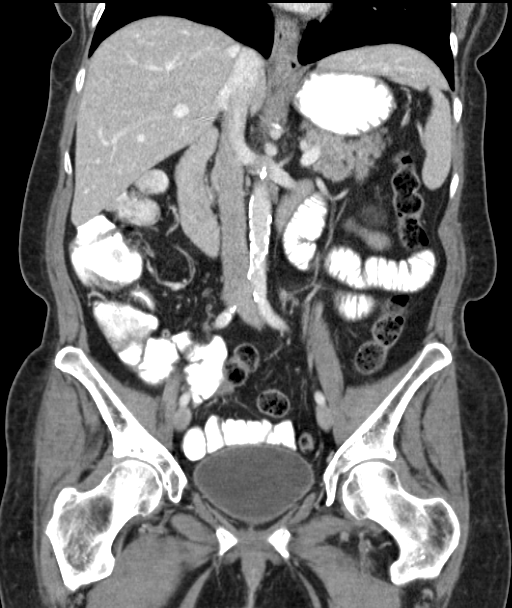

[16 of 46 positions shown; findings below may reference images not displayed]

FINDINGS: The visualized lung bases are clear. There is coronary vascular
calcification. No intra-abdominal free air or free fluid.

Cholecystectomy. The liver, pancreas, spleen, adrenal glands,
kidneys, visualized ureters, and urinary bladder appear
unremarkable. The uterus and the ovaries are grossly unremarkable.

There is apparent soft tissue prominence of the posterior wall of
the rectum (series 2, image 81). This is similar to the study dated
04/19/2008 and likely represent rectal mucosa fold. There is no
evidence of bowel obstruction or inflammation. Normal appendix.

There is aortoiliac atherosclerotic disease. The abdominal aorta and
IVC appear patent. No portal venous gas identified. There is no
adenopathy. Small stable infraumbilical fat containing hernia with
mild stranding of the herniated fat similar to prior study. No fluid
collection. Midline vertical anterior pelvic wall incisional scar.
The abdominal wall soft tissues are otherwise unremarkable. There is
degenerative changes of the spine. The osseous structures are
otherwise intact.
IMPRESSION: No acute intra-abdominal pelvic pathology.

## 2016-06-15 ENCOUNTER — Encounter: Payer: Self-pay | Admitting: Gastroenterology

## 2016-06-15 ENCOUNTER — Ambulatory Visit (AMBULATORY_SURGERY_CENTER): Payer: BLUE CROSS/BLUE SHIELD | Admitting: Gastroenterology

## 2016-06-15 VITALS — BP 119/53 | HR 67 | Temp 97.8°F | Resp 12 | Ht 62.0 in | Wt 149.0 lb

## 2016-06-15 DIAGNOSIS — D123 Benign neoplasm of transverse colon: Secondary | ICD-10-CM | POA: Diagnosis not present

## 2016-06-15 DIAGNOSIS — Z1211 Encounter for screening for malignant neoplasm of colon: Secondary | ICD-10-CM

## 2016-06-15 LAB — GLUCOSE, CAPILLARY
GLUCOSE-CAPILLARY: 269 mg/dL — AB (ref 65–99)
GLUCOSE-CAPILLARY: 248 mg/dL — AB (ref 65–99)

## 2016-06-15 MED ORDER — SODIUM CHLORIDE 0.9 % IV SOLN: 500.0000 mL | INTRAVENOUS | Status: DC

## 2016-06-15 NOTE — Patient Instructions (Signed)
Discharge instructions given. Handout on polyps. No aspirin,ibuprofen,naproxen, or other non-steroidal anti-inflammatory drugs for 3 days. Resume previous medications. Please refer to the blue and neon green sheets for instructions regarding diet and activity for the rest of today.YOU HAD AN ENDOSCOPIC PROCEDURE TODAY AT [facility]:   Refer to the procedure report that was given to you for any specific questions about what was found during the examination.  If the procedure report does not answer your questions, please call your gastroenterologist to clarify.  If you requested that your care partner not be given the details of your procedure findings, then the procedure report has been included in a sealed envelope for you to review at your convenience later.  YOU SHOULD EXPECT: Some feelings of bloating in the abdomen. Passage of more gas than usual.  Walking can help get rid of the air that was put into your GI tract during the procedure and reduce the bloating. If you had a lower endoscopy (such as a colonoscopy or flexible sigmoidoscopy) you may notice spotting of blood in your stool or on the toilet paper. If you underwent a bowel prep for your procedure, you may not have a normal bowel movement for a few days.  Please Note:  You might notice some irritation and congestion in your nose or some drainage.  This is from the oxygen used during your procedure.  There is no need for concern and it should clear up in a day or so.  SYMPTOMS TO REPORT IMMEDIATELY:   Following lower endoscopy (colonoscopy or flexible sigmoidoscopy):  Excessive amounts of blood in the stool  Significant tenderness or worsening of abdominal pains  Swelling of the abdomen that is new, acute  Fever of 100F or higher   For urgent or emergent issues, a gastroenterologist can be reached at any hour by calling [phone].   DIET:  We do recommend a small meal at first, but then you may proceed to  your regular diet.  Drink plenty of fluids but you should avoid alcoholic beverages for 24 hours.  ACTIVITY:  You should plan to take it easy for the rest of today and you should NOT DRIVE or use heavy machinery until tomorrow (because of the sedation medicines used during the test).    FOLLOW UP: Our staff will call the number listed on your records the next business day following your procedure to check on you and address any questions or concerns that you may have regarding the information given to you following your procedure. If we do not reach you, we will leave a message.  However, if you are feeling well and you are not experiencing any problems, there is no need to return our call.  We will assume that you have returned to your regular daily activities without incident.  If any biopsies were taken you will be contacted by phone or by letter within the next 1-3 weeks.  Please call us at [phone] if you have not heard about the biopsies in 3 weeks.    SIGNATURES/CONFIDENTIALITY: You and/or your care partner have signed paperwork which will be entered into your electronic medical record.  These signatures attest to the fact that that the information above on your After Visit Summary has been reviewed and is understood.  Full responsibility of the confidentiality of this discharge information lies with you and/or your care-partner.

## 2016-06-15 NOTE — Progress Notes (Signed)
To recovery, report to McCoy, RN, VSS 

## 2016-06-15 NOTE — Op Note (Signed)
[facility] Patient Name: Destiny Moody Procedure Date: 06/15/2016 10:28 AM MRN: [id number] Endoscopist: Causey. Loletha Carrow , MD Age: 65 Referring MD:  Date of Birth: 20-Dec-1950 Gender: Female Account #: [id number] Procedure:                Colonoscopy Indications:              Screening for colorectal malignant neoplasm, This                            is the patient's first screening colonoscopy Medicines:                Monitored Anesthesia Care Procedure:                Pre-Anesthesia Assessment:                           - Prior to the procedure, a History and Physical                            was performed, and patient medications and                            allergies were reviewed. The patient's tolerance of                            previous anesthesia was also reviewed. The risks                            and benefits of the procedure and the sedation                            options and risks were discussed with the patient.                            All questions were answered, and informed consent                            was obtained. Anticoagulants: The patient has taken                            aspirin. It was decided not to withhold this                            medication prior to the procedure. ASA Grade                            Assessment: II - A patient with mild systemic                            disease. After reviewing the risks and benefits,                            the patient was deemed in satisfactory condition to  undergo the procedure.                           After obtaining informed consent, the colonoscope                            was passed under direct vision. Throughout the                            procedure, the patient's blood pressure, pulse, and                            oxygen saturations were monitored continuously. The                            Model CF-HQ190L [id number]) scope was  introduced                            through the anus and advanced to the the cecum,                            identified by appendiceal orifice and ileocecal                            valve. The colonoscopy was performed without                            difficulty. The patient tolerated the procedure                            well. The quality of the bowel preparation was                            excellent. The ileocecal valve, appendiceal                            orifice, and rectum were photographed. The quality                            of the bowel preparation was evaluated using the                            BBPS [facility] Bowel Preparation Scale) with scores                            of: Right Colon = 2, Transverse Colon = 3 and Left                            Colon = 3. The total BBPS score equals 8. The bowel                            preparation used was SUPREP. Scope In: 10:46:06 AM Scope Out: 10:58:01 AM Scope Withdrawal Time: 0 hours 9 minutes 6 seconds  Total Procedure Duration: 0 hours 11 minutes 55 seconds  Findings:                 The perianal and digital rectal examinations were                            normal.                           A 4 mm polyp was found in the proximal transverse                            colon. The polyp was sessile. The polyp was removed                            with a cold snare. Resection and retrieval were                            complete.                           The exam was otherwise without abnormality on                            direct and retroflexion views. Complications:            No immediate complications. Estimated Blood Loss:     Estimated blood loss: none. Impression:               - One 4 mm polyp in the proximal transverse colon,                            removed with a cold snare. Resected and retrieved.                           - The examination was otherwise normal on direct                             and retroflexion views. Recommendation:           - Patient has a contact number available for                            emergencies. The signs and symptoms of potential                            delayed complications were discussed with the                            patient. Return to normal activities tomorrow.                            Written discharge instructions were provided to the                            patient.                           -  Resume previous diet.                           - No aspirin, ibuprofen, naproxen, or other                            non-steroidal anti-inflammatory drugs for 3 days                            after polyp removal.                           - Await pathology results.                           - Repeat colonoscopy is recommended for                            surveillance. The colonoscopy date will be                            determined after pathology results from today's                            exam become available for review. Keniyah Gelinas L. Loletha Carrow, MD 06/15/2016 11:01:34 AM This report has been signed electronically.

## 2016-06-15 NOTE — Progress Notes (Signed)
Called to room to assist during endoscopic procedure.  Patient ID and intended procedure confirmed with present staff. Received instructions for my participation in the procedure from the performing physician.  

## 2016-06-18 ENCOUNTER — Telehealth: Payer: Self-pay

## 2016-06-18 ENCOUNTER — Telehealth: Payer: Self-pay | Admitting: *Deleted

## 2016-06-18 NOTE — Telephone Encounter (Signed)
  Follow up Call-  Call back number 06/15/2016  Post procedure Call Back phone  # [phone]  Permission to leave phone message Yes  Some recent data might be hidden    Patient was called for follow up after her procedure on 06/15/2016. No answer at the number given for follow up phone call. A message was left on the answering machine. This was the second attempt to contact the patient.

## 2016-06-18 NOTE — Telephone Encounter (Signed)
  Follow up Call-  Call back number 06/15/2016  Post procedure Call Back phone  # [phone]  Permission to leave phone message Yes  Some recent data might be hidden     No answer, left message

## 2016-06-18 NOTE — Telephone Encounter (Signed)
  Follow up Call-  Call back number 06/15/2016  Post procedure Call Back phone  # [phone]  Permission to leave phone message Yes  Some recent data might be hidden    Patient was called for follow up after her procedure on 06/15/2016. No answer at the number given for follow up phone call. This was a second attempt to reach the patient. A message was left on the answering machine.

## 2016-06-25 ENCOUNTER — Other Ambulatory Visit: Payer: Self-pay

## 2016-06-25 MED ORDER — ALENDRONATE SODIUM 70 MG PO TABS: 70.0000 mg | ORAL_TABLET | ORAL | 0 refills | Status: DC

## 2016-06-27 ENCOUNTER — Encounter: Payer: Self-pay | Admitting: Gastroenterology

## 2016-07-14 ENCOUNTER — Ambulatory Visit (INDEPENDENT_AMBULATORY_CARE_PROVIDER_SITE_OTHER): Payer: BLUE CROSS/BLUE SHIELD | Admitting: Family Medicine

## 2016-07-14 VITALS — BP 112/76 | HR 88 | Temp 98.2°F | Resp 16 | Ht 62.0 in | Wt 145.0 lb

## 2016-07-14 DIAGNOSIS — I10 Essential (primary) hypertension: Secondary | ICD-10-CM

## 2016-07-14 DIAGNOSIS — J069 Acute upper respiratory infection, unspecified: Secondary | ICD-10-CM

## 2016-07-14 DIAGNOSIS — M129 Arthropathy, unspecified: Secondary | ICD-10-CM | POA: Diagnosis not present

## 2016-07-14 DIAGNOSIS — E119 Type 2 diabetes mellitus without complications: Secondary | ICD-10-CM

## 2016-07-14 DIAGNOSIS — Z23 Encounter for immunization: Secondary | ICD-10-CM

## 2016-07-14 LAB — COMPREHENSIVE METABOLIC PANEL
ALK PHOS: 56 U/L (ref 33–130)
ALT: 23 U/L (ref 6–29)
AST: 20 U/L (ref 10–35)
Albumin: 4 g/dL (ref 3.6–5.1)
BUN: 19 mg/dL (ref 7–25)
CO2: 24 mmol/L (ref 20–31)
Calcium: 9.6 mg/dL (ref 8.6–10.4)
Chloride: 103 mmol/L (ref 98–110)
Creat: 0.72 mg/dL (ref 0.50–0.99)
GLUCOSE: 216 mg/dL — AB (ref 65–99)
POTASSIUM: 4.5 mmol/L (ref 3.5–5.3)
Sodium: 138 mmol/L (ref 135–146)
Total Bilirubin: 0.4 mg/dL (ref 0.2–1.2)
Total Protein: 7.3 g/dL (ref 6.1–8.1)

## 2016-07-14 LAB — LIPID PANEL
CHOL/HDL RATIO: 3.6 ratio (ref <=5.0)
Cholesterol: 150 mg/dL (ref 125–200)
HDL: 42 mg/dL — ABNORMAL LOW (ref >=46)
LDL CALC: 76 mg/dL (ref <130)
Triglycerides: 161 mg/dL — ABNORMAL HIGH (ref <150)
VLDL: 32 mg/dL — ABNORMAL HIGH (ref <30)

## 2016-07-14 MED ORDER — BENZONATATE 100 MG PO CAPS: 100.0000 mg | ORAL_CAPSULE | Freq: Two times a day (BID) | ORAL | 0 refills | Status: DC | PRN

## 2016-07-14 MED ORDER — FLUTICASONE PROPIONATE 50 MCG/ACT NA SUSP: 1.0000 | Freq: Two times a day (BID) | NASAL | 0 refills | Status: DC

## 2016-07-14 NOTE — Patient Instructions (Addendum)
IF you received an x-ray today, you will receive an invoice from [facility]. Please contact [facility] at [phone] with questions or concerns regarding your invoice.   IF you received labwork today, you will receive an invoice from [employer]. Please contact [facility] at [phone] with questions or concerns regarding your invoice.   Our billing staff will not be able to assist you with questions regarding bills from these companies.  You will be contacted with the lab results as soon as they are available. The fastest way to get your results is to activate your My Chart account. Instructions are located on the last page of this paperwork. If you have not heard from [place] regarding the results in 2 weeks, please contact this office.     Arthritis Arthritis is a term that is commonly used to refer to joint pain or joint disease. There are more than 100 types of arthritis. CAUSES The most common cause of this condition is wear and tear of a joint. Other causes include:  Gout.  Inflammation of a joint.  An infection of a joint.  Sprains and other injuries near the joint.  A drug reaction or allergic reaction. In some cases, the cause may not be known. SYMPTOMS The main symptom of this condition is pain in the joint with movement. Other symptoms include:  Redness, swelling, or stiffness at a joint.  Warmth coming from the joint.  Fever.  Overall feeling of illness. DIAGNOSIS This condition may be diagnosed with a physical exam and tests, including:  Blood tests.  Urine tests.  Imaging tests, such as MRI, X-rays, or a CT scan. Sometimes, fluid is removed from a joint for testing. TREATMENT Treatment for this condition may involve:  Treatment of the cause, if it is known.  Rest.  Raising (elevating) the joint.  Applying cold or hot packs to the joint.  Medicines to improve symptoms and reduce  inflammation.  Injections of a steroid such as cortisone into the joint to help reduce pain and inflammation. Depending on the cause of your arthritis, you may need to make lifestyle changes to reduce stress on your joint. These changes may include exercising more and losing weight. HOME CARE INSTRUCTIONS Medicines  Take over-the-counter and prescription medicines only as told by your health care provider.  Do not take aspirin to relieve pain if gout is suspected. Activities  Rest your joint if told by your health care provider. Rest is important when your disease is active and your joint feels painful, swollen, or stiff.  Avoid activities that make the pain worse. It is important to balance activity with rest.  Exercise your joint regularly with range-of-motion exercises as told by your health care provider. Try doing low-impact exercise, such as:  Swimming.  Water aerobics.  Biking.  Walking. Joint Care  If your joint is swollen, keep it elevated if told by your health care provider.  If your joint feels stiff in the morning, try taking a warm shower.  If directed, apply heat to the joint. If you have diabetes, do not apply heat without permission from your health care provider.  Put a towel between the joint and the hot pack or heating pad.  Leave the heat on the area for 20-30 minutes.  If directed, apply ice to the joint:  Put ice in a plastic bag.  Place a towel between your skin and the bag.  Leave the ice on for 20 minutes, 2-3 times  per day.  Keep all follow-up visits as told by your health care provider. This is important. SEEK MEDICAL CARE IF:  The pain gets worse.  You have a fever. SEEK IMMEDIATE MEDICAL CARE IF:  You develop severe joint pain, swelling, or redness.  Many joints become painful and swollen.  You develop severe back pain.  You develop severe weakness in your leg.  You cannot control your bladder or bowels.   This information  is not intended to replace advice given to you by your health care provider. Make sure you discuss any questions you have with your health care provider.   Document Released: 10/11/2004 Document Revised: 05/25/2015 Document Reviewed: 11/29/2014 Elsevier Interactive Patient Education [employer].

## 2016-07-14 NOTE — Progress Notes (Signed)
Chief Complaint  Patient presents with  . Headache  . Cough    x 1 week   . Fatigue    HPI  Upper Respiratory Infection: Patient complains of symptoms of a URI. Symptoms include congestion and cough. Onset of symptoms was 1 weeks ago, gradually improving since that time. She also c/o achiness, congestion, facial pain, lightheadedness, no  fever, post nasal drip, productive cough with  clear colored sputum and sinus pressure for the past 1 week .  She is drinking plenty of fluids. Evaluation to date: none. Treatment to date: antihistamines.  Essential Hypertension- chronic problem Pt reports that she takes her blood pressure medications She reports that she has tolerated the medication well She denies chest pains, palpitations and headache  BP Readings from Last 3 Encounters:  07/14/16 112/76  06/15/16 (!) 119/53  02/09/16 134/60     Diabetes Mellitus- chronic problem Diabetes Mellitus: Patient presents for follow up of diabetes. Symptoms: none. Symptoms have been well-controlled. Patient denies hypoglycemia with blood glucose 50s once a year.  Evaluation to date has been included: fasting blood sugar and hemoglobin A1C.  Home sugars: BGs range between 120 and 160 fasting.  Lab Results  Component Value Date   HGBA1C 6.7 10/18/2015   Arthritis  Pt reports that she gets hip pain and knee stiffness  She was seeing chiropractor and while in [place] saw an Doctor, general practice She reports that she has stiffness in the joints that makes her feel older especially in the "right hip where it connects in the back".  Past Medical History:  Diagnosis Date  . Allergy   . Breast cancer ([place]) 08/24/14   right, upper inner  . Cancer ([place])   . Depression    no meds  . Family history of colon cancer   . Family history of ovarian cancer   . HSV infection   . Hypercholesterolemia   . Hypertension   . Hypothyroidism   . Multinodular thyroid   . NIDDM (non-insulin dependent diabetes  mellitus)   . Osteopenia   . Osteoporosis 2015  . Radiation 01/31/15-03/01/15   right  breast  . Rheumatoid arthritis(714.0)    lower back  . Wears contact lenses     Current Outpatient Prescriptions  Medication Sig Dispense Refill  . alendronate (FOSAMAX) 70 MG tablet Take 1 tablet (70 mg total) by mouth once a week. Take with a full glass of water on an empty stomach. 4 tablet 0  . amitriptyline (ELAVIL) 25 MG tablet TAKE ONE TABLET BY MOUTH AT BEDTIME  "OFFICE VISIT NEEDED" 90 tablet 3  . aspirin 81 MG tablet Take 81 mg by mouth daily.    . Calcium Carbonate-Vitamin D 600-200 MG-UNIT TABS Take 1,200 mg by mouth.    . fluticasone (FLONASE) 50 MCG/ACT nasal spray Place 1 spray into both nostrils 2 (two) times daily. 16 g 0  . glimepiride (AMARYL) 2 MG tablet TAKE ONE TABLET BY MOUTH ONCE DAILY BEFORE BREAKFAST. 90 tablet 3  . Glucosamine 500 MG CAPS Take 1 capsule by mouth daily.    Marland Kitchen ibuprofen (ADVIL,MOTRIN) 200 MG tablet Take 200 mg by mouth every 6 (six) hours as needed. Reported on 01/24/2016    . lactobacillus acidophilus (BACID) TABS tablet Take 2 tablets by mouth 3 (three) times daily.    Marland Kitchen lisinopril (PRINIVIL,ZESTRIL) 2.5 MG tablet Take 1 tablet (2.5 mg total) by mouth daily. 90 tablet 3  . metFORMIN (GLUCOPHAGE-XR) 500 MG 24 hr tablet Take 2 tablets (1,000  mg total) by mouth 2 (two) times daily. 360 tablet 3  . Multiple Vitamin (MULTIVITAMIN) capsule Take 1 capsule by mouth daily.    . pioglitazone (ACTOS) 30 MG tablet TAKE ONE TABLET BY MOUTH ONCE DAILY 90 tablet 3  . simvastatin (ZOCOR) 20 MG tablet Take 1 tablet (20 mg total) by mouth at bedtime. 90 tablet 3  . tamoxifen (NOLVADEX) 20 MG tablet Take 1 tablet (20 mg total) by mouth daily. 90 tablet 4  . TURMERIC PO Take 1 capsule by mouth daily.    . benzonatate (TESSALON) 100 MG capsule Take 1 capsule (100 mg total) by mouth 2 (two) times daily as needed for cough. 30 capsule 0   Current Facility-Administered Medications    Medication Dose Route Frequency Provider Last Rate Last Dose  . 0.9 %  sodium chloride infusion  500 mL Intravenous Continuous [name], MD        Allergies:  Allergies  Allergen Reactions  . Shellfish Allergy Hives    Past Surgical History:  Procedure Laterality Date  . CESAREAN SECTION  [id number]   x 2  . CHOLECYSTECTOMY  1993  . COLONOSCOPY     greater than 12 years but not sure where colonoscopy was performed  . INCISION AND DRAINAGE ABSCESS Right 11/24/2014   Procedure: INCISION AND DRAINAGE RIGHT AXILLA  ABSCESS;  Surgeon: Fanny Skates, MD;  Location: [facility];  Service: General;  Laterality: Right;  . RADIOACTIVE SEED GUIDED MASTECTOMY WITH AXILLARY SENTINEL LYMPH NODE BIOPSY Right 10/12/2014   Procedure: RIGHT  PARTIAL MASTECTOMY WITH RADIOACTIVE SEED LOCALIZATION  RIGHT  AXILLARY SENTINEL  NODE BIOPSY;  Surgeon: Fanny Skates, MD;  Location: [facility];  Service: General;  Laterality: Right;  . TUBAL LIGATION  1996   re annastomosis  . WISDOM TOOTH EXTRACTION      Social History   Social History  . Marital status: Married    Spouse name: Arnoldo  . Number of children: 2  . Years of education: College   Occupational History  .      Self Employed   Social History Main Topics  . Smoking status: Never Smoker  . Smokeless tobacco: Never Used  . Alcohol use 0.0 oz/week     Comment: occasionally liquor  . Drug use: No  . Sexual activity: Not on file     Comment: menarche age 54, G42 , P 2, menopause age 78, no HRT   Other Topics Concern  . Not on file   Social History Narrative   Patient lives at home with her spouse.   Caffeine use: 1 cup daily    ROS See hpi  Objective: Vitals:   07/14/16 1400  BP: 112/76  Pulse: 88  Resp: 16  Temp: 98.2 F (36.8 C)  TempSrc: Oral  SpO2: 98%  Weight: 145 lb (65.8 kg)  Height: 5\' 2"  (1.575 m)    Physical Exam General: alert, oriented, in NAD Head: normocephalic, atraumatic, no sinus  tenderness Eyes: EOM intact, no scleral icterus or conjunctival injection Ears: TM clear bilaterally Throat: no pharyngeal exudate or erythema Lymph: no posterior auricular, submental or cervical lymph adenopathy Heart: normal rate, normal sinus rhythm, no murmurs Lungs: clear to auscultation bilaterally, no wheezing   Assessment and Plan Lewis was seen today for headache, cough and fatigue.  Diagnoses and all orders for this visit:  Need for prophylactic vaccination and inoculation against influenza -     Flu Vaccine QUAD 36+ mos IM  Controlled type 2 diabetes mellitus without complication, without long-term current use of insulin ([place])- last a1c and home blood glucose well controlled.  Will check today -     Hemoglobin A1c -     Comprehensive metabolic panel -     Lipid panel  Essential hypertension- bp well controlled -     Comprehensive metabolic panel -     Lipid panel  Arthritis, multiple joint involvement- given nature of patient arthritis will refer to Rheumatology -     Ambulatory referral to Rheumatology  Acute URI- advised follow up if symptoms worsen Supportive care with tessalon and flonase -     benzonatate (TESSALON) 100 MG capsule; Take 1 capsule (100 mg total) by mouth 2 (two) times daily as needed for cough. -     fluticasone (FLONASE) 50 MCG/ACT nasal spray; Place 1 spray into both nostrils 2 (two) times daily.     [place]

## 2016-07-15 LAB — HEMOGLOBIN A1C
HEMOGLOBIN A1C: 7.6 % — AB (ref <5.7)
MEAN PLASMA GLUCOSE: 171 mg/dL

## 2016-07-18 ENCOUNTER — Telehealth: Payer: Self-pay | Admitting: Family Medicine

## 2016-07-18 ENCOUNTER — Encounter: Payer: Self-pay | Admitting: Family Medicine

## 2016-07-18 MED ORDER — PIOGLITAZONE HCL 45 MG PO TABS: 45.0000 mg | ORAL_TABLET | Freq: Every day | ORAL | 0 refills | Status: DC

## 2016-07-18 NOTE — Telephone Encounter (Signed)
Discussed that her a1c has increased from 6.7 to 7.6 in the past 9 months. Advised her to return to clinic in 3 months.  Increased her actos to 45mg  up from 30mg .  Discussed that she should check her glucose at home. She verbalized understanding.

## 2016-07-19 ENCOUNTER — Other Ambulatory Visit: Payer: Self-pay

## 2016-07-19 DIAGNOSIS — E119 Type 2 diabetes mellitus without complications: Secondary | ICD-10-CM

## 2016-07-19 MED ORDER — METFORMIN HCL ER 500 MG PO TB24: 1000.0000 mg | ORAL_TABLET | Freq: Two times a day (BID) | ORAL | 0 refills | Status: DC

## 2016-07-24 ENCOUNTER — Telehealth: Payer: Self-pay | Admitting: *Deleted

## 2016-07-24 NOTE — Telephone Encounter (Signed)
Pt called and left message requesting to talk to Chestine Spore, survivorship provider.  Attempted to call pt back unsuccessfully.   Left message on voice mail requesting a call back from pt in the am. Pt's    Phone     [phone].

## 2016-07-27 ENCOUNTER — Other Ambulatory Visit: Payer: Self-pay | Admitting: *Deleted

## 2016-07-27 ENCOUNTER — Telehealth: Payer: Self-pay | Admitting: *Deleted

## 2016-07-30 NOTE — Telephone Encounter (Signed)
This RN returned call and obtained an identified VM - message left to return call to this RN or to follow prompts to speak with a triage nurse if concerns more urgent.

## 2016-07-31 ENCOUNTER — Telehealth: Payer: Self-pay | Admitting: *Deleted

## 2016-07-31 DIAGNOSIS — C50211 Malignant neoplasm of upper-inner quadrant of right female breast: Secondary | ICD-10-CM

## 2016-07-31 NOTE — Telephone Encounter (Signed)
This RN spoke with pt per her return call - Destiny Moody states onset of " my right arm is hurting "  Note history of R lumpectomy with lymph node dissection .  Per discussion she states some swelling with indention - arm is warmer then left arm when asked for comparison.  This RN also inquired if any changes in her R breast - [name] states she had right follow up studies several months ago and breast has a known abnormality.  She states she is unsure when she is due for follow up mammograms.  This RN reviewed chart and noted she is due for yearly mammos this month.  Per above informed pt of need to obtain scans and then proceed to lymphedema clinic post results.  Due to concern for possible cellulitis and need for ABX - pt needs to be seen in our symptom management clinic.  She is currently with her husband who is having an outpt procedure today and is unable to come in- appointment will be made for tomorrow.  This RN will follow up per need for orders for mammo with u/s per recommendation.

## 2016-08-01 ENCOUNTER — Ambulatory Visit (HOSPITAL_BASED_OUTPATIENT_CLINIC_OR_DEPARTMENT_OTHER): Payer: BLUE CROSS/BLUE SHIELD | Admitting: Nurse Practitioner

## 2016-08-01 ENCOUNTER — Encounter: Payer: Self-pay | Admitting: Nurse Practitioner

## 2016-08-01 VITALS — BP 131/72 | HR 74 | Resp 18 | Wt 148.3 lb

## 2016-08-01 DIAGNOSIS — G8929 Other chronic pain: Secondary | ICD-10-CM | POA: Insufficient documentation

## 2016-08-01 DIAGNOSIS — C50211 Malignant neoplasm of upper-inner quadrant of right female breast: Secondary | ICD-10-CM

## 2016-08-01 DIAGNOSIS — Z170 Estrogen receptor positive status [ER+]: Secondary | ICD-10-CM | POA: Diagnosis not present

## 2016-08-01 DIAGNOSIS — M25531 Pain in right wrist: Secondary | ICD-10-CM

## 2016-08-01 DIAGNOSIS — Z79810 Long term (current) use of selective estrogen receptor modulators (SERMs): Secondary | ICD-10-CM

## 2016-08-01 NOTE — Progress Notes (Signed)
SYMPTOM MANAGEMENT CLINIC    Chief Complaint: Wrist pain, breast mass  HPI:  Destiny Moody 65 y.o. female diagnosed with breast cancer.  Patient is status post chemotherapy and radiation treatments.  She is currently taking tamoxifen oral therapy.     Breast cancer of upper-inner quadrant of right female breast ([place])   06/11/2014 Mammogram    Diagnostic mammogram: Asymmetric density in the medial aspect of the right breast associated with mild distortion. No discrete mass or suspicious microcalcifications identified. No sonographic correlate.      06/15/2014 Initial Biopsy    Right breast needle core bx: multiple benign areas of fibrovascular and adipose soft tissue; no evidence of malignancy.  Discordant with imaging.  Plan for MRI in 6-8 weeks post biopsy. Small hematoma formation following biopsy necessitating drainage.      08/04/2014 Breast MRI    Post biopsy changes in the medial right breast at posterior depth. A 1.7 cm AP diameter area of linear clumped non mass enhancement is noted superior and lateral to the biopsy site representing good MR correlate to the previous mammo finding.      08/24/2014 Procedure    MRI guided bx right breast: Invasive Ductal Carcinoma, grade 1-2, ER+ (100%), PR+ (99%), HER2/NEU negative (ratio 0.91), Ki 67 12%. DCIS present.      10/12/2014 Definitive Surgery    Right breast lumpectomy with SLNB Dalbert Batman): IDC with DCIS. Grade 1. Repeat HER2/NEU negative (ratio 1.13).  Right axillary LN negative for tumor.  Negative margins.      10/12/2014 Pathologic Stage    Stage IA: T1c N0      10/12/2014 Clinical Stage    Stage IA (T1cN0)      10/12/2014 Oncotype testing    Recurrence score 14 (9% ROR). No chemo ([place]).      11/24/2014 Surgery    Right axillary abscess incision and drainage.  RT delayed to allow for healing.      01/31/2015 - 03/01/2015 Radiation Therapy    Adjuvant RT completed Pablo Ledger): Right breast 42.72 Gy over 21 fractions;  right breast boost 10 Gy over 5 fractions.  Total dose: 52.72 Gy.      04/26/2015 Procedure    Genetic testing: OvaNext panel Cephus Shelling) shows no clinically significant variants in ATM, BARD1, BRCA1, BRCA2, BRIP1, CDH1, CHEK2, EPCAM, MLH1, MRE11A, MSH2, MSH6, MUTYH, NBN, NF1, PALB2, PMS2, PTEN, RAD50, RAD51C, RAD51D, SMARCA4, STK11, and TP53.       Anti-estrogen oral therapy    Plan to begin Tamoxifen 20 mg daily May 19, 2015 ([facility]) to allow recovery from RT.  Plan for 2 years with consideration of change to AI at that time.      05/10/2015 Survivorship    Survivorship visit completed and copy of survivorship care plan provided to patient.        Review of Systems  Respiratory:       Right breast.  Questionable mass, right wrist pain.  All other systems reviewed and are negative.   Past Medical History:  Diagnosis Date  . Allergy   . Breast cancer ([place]) 08/24/14   right, upper inner  . Cancer ([place])   . Depression    no meds  . Family history of colon cancer   . Family history of ovarian cancer   . HSV infection   . Hypercholesterolemia   . Hypertension   . Hypothyroidism   . Multinodular thyroid   . NIDDM (non-insulin dependent diabetes mellitus)   . Osteopenia   .  Osteoporosis 2015  . Radiation 01/31/15-03/01/15   right  breast  . Rheumatoid arthritis(714.0)    lower back  . Wears contact lenses     Past Surgical History:  Procedure Laterality Date  . CESAREAN SECTION  [id number],9476   x 2  . CHOLECYSTECTOMY  1993  . COLONOSCOPY     greater than 12 years but not sure where colonoscopy was performed  . INCISION AND DRAINAGE ABSCESS Right 11/24/2014   Procedure: INCISION AND DRAINAGE RIGHT AXILLA  ABSCESS;  Surgeon: Fanny Skates, MD;  Location: [facility];  Service: General;  Laterality: Right;  . RADIOACTIVE SEED GUIDED MASTECTOMY WITH AXILLARY SENTINEL LYMPH NODE BIOPSY Right 10/12/2014   Procedure: RIGHT  PARTIAL MASTECTOMY WITH RADIOACTIVE SEED LOCALIZATION  RIGHT   AXILLARY SENTINEL  NODE BIOPSY;  Surgeon: Fanny Skates, MD;  Location: [facility];  Service: General;  Laterality: Right;  . TUBAL LIGATION  1996   re annastomosis  . WISDOM TOOTH EXTRACTION      has GERD; FATTY LIVER DISEASE; Vaginal atrophy; Osteoporosis; Breast cancer of upper-inner quadrant of right female breast ([place]); Diabetes mellitus type II, non insulin dependent ([place]); Family history of colon cancer; Family history of ovarian cancer; Genetic testing; and Wrist pain, chronic, right on her problem list.    is allergic to shellfish allergy.    Medication List       Accurate as of 08/01/16 11:14 AM. Always use your most recent med list.          amitriptyline 25 MG tablet Commonly known as:  ELAVIL TAKE ONE TABLET BY MOUTH AT BEDTIME  "OFFICE VISIT NEEDED"   aspirin 81 MG tablet Take 81 mg by mouth daily.   benzonatate 100 MG capsule Commonly known as:  TESSALON Take 1 capsule (100 mg total) by mouth 2 (two) times daily as needed for cough.   Calcium Carbonate-Vitamin D 600-200 MG-UNIT Tabs Take 1,200 mg by mouth.   fluticasone 50 MCG/ACT nasal spray Commonly known as:  FLONASE Place 1 spray into both nostrils 2 (two) times daily.   glimepiride 2 MG tablet Commonly known as:  AMARYL TAKE ONE TABLET BY MOUTH ONCE DAILY BEFORE BREAKFAST.   Glucosamine 500 MG Caps Take 1 capsule by mouth daily.   ibuprofen 200 MG tablet Commonly known as:  ADVIL,MOTRIN Take 200 mg by mouth every 6 (six) hours as needed. Reported on 01/24/2016   lactobacillus acidophilus Tabs tablet Take 2 tablets by mouth 3 (three) times daily.   lisinopril 2.5 MG tablet Commonly known as:  PRINIVIL,ZESTRIL Take 1 tablet (2.5 mg total) by mouth daily.   metFORMIN 500 MG 24 hr tablet Commonly known as:  GLUCOPHAGE-XR Take 2 tablets (1,000 mg total) by mouth 2 (two) times daily.   multivitamin capsule Take 1 capsule by mouth daily.   pioglitazone 45 MG tablet Commonly  known as:  ACTOS Take 1 tablet (45 mg total) by mouth daily. Office visit needed for refills   simvastatin 20 MG tablet Commonly known as:  ZOCOR Take 1 tablet (20 mg total) by mouth at bedtime.   tamoxifen 20 MG tablet Commonly known as:  NOLVADEX Take 1 tablet (20 mg total) by mouth daily.   TURMERIC PO Take 1 capsule by mouth daily.        PHYSICAL EXAMINATION  Oncology Vitals 08/01/2016 07/14/2016  Height - 158 cm  Weight 67.268 kg 65.772 kg  Weight (lbs) 148 lbs 5 oz 145 lbs  BMI (kg/m2) 27.12 kg/m2 26.52 kg/m2  Temp (No Data) 98.2  Pulse 74 88  Resp 18 16  SpO2 100 98  BSA (m2) 1.72 m2 1.7 m2   BP Readings from Last 2 Encounters:  08/01/16 131/72  07/14/16 112/76    Physical Exam  Constitutional: She is oriented to person, place, and time and well-developed, well-nourished, and in no distress.  HENT:  Head: Normocephalic and atraumatic.  Mouth/Throat: Oropharynx is clear and moist.  Eyes: Conjunctivae and EOM are normal. Pupils are equal, round, and reactive to light. Right eye exhibits no discharge. Left eye exhibits no discharge. No scleral icterus.  Neck: Normal range of motion. Neck supple. No JVD present. No tracheal deviation present. No thyromegaly present.  Cardiovascular: Normal rate, regular rhythm, normal heart sounds and intact distal pulses.   Pulmonary/Chest: Effort normal and breath sounds normal. No respiratory distress. She has no wheezes. She has no rales. She exhibits no tenderness.  Abdominal: Soft. Bowel sounds are normal. She exhibits no distension and no mass. There is no tenderness. There is no rebound and no guarding.  Musculoskeletal: Normal range of motion. She exhibits no edema or tenderness.  Exam today revealed that patient had no edema, no redness, and no tenderness to the entire right arm. She had full range of motion with her wrist and shoulder. There was no obvious lymphedema as well.   However, breast exam reveals that the  right breast is much firmer in general than the left. There is a palpable mass approximately 1 cm to the right outer breast, underneath her axilla region; and a larger firm mass directly below this palpable smaller mass. This area is nontender with no redness, warmth, or red streaks.      Lymphadenopathy:    She has no cervical adenopathy.  Neurological: She is alert and oriented to person, place, and time. Gait normal.  Skin: Skin is warm and dry. No rash noted. No erythema. No pallor.  Psychiatric: Affect normal.  Nursing note and vitals reviewed.   LABORATORY DATA:. No visits with results within 3 Day(s) from this visit.  Latest known visit with results is:  Office Visit on 07/14/2016  Component Date Value Ref Range Status  . Hgb A1c MFr Bld 07/15/2016 7.6* <5.7 % Final   Comment:   For someone without known diabetes, a hemoglobin A1c value of 6.5% or greater indicates that they may have diabetes and this should be confirmed with a follow-up test.   For someone with known diabetes, a value <7% indicates that their diabetes is well controlled and a value greater than or equal to 7% indicates suboptimal control. A1c targets should be individualized based on duration of diabetes, age, comorbid conditions, and other considerations.   Currently, no consensus exists for use of hemoglobin A1c for diagnosis of diabetes for children.     . Mean Plasma Glucose 07/15/2016 171  mg/dL Final  . Sodium 07/14/2016 138  135 - 146 mmol/L Final  . Potassium 07/14/2016 4.5  3.5 - 5.3 mmol/L Final  . Chloride 07/14/2016 103  98 - 110 mmol/L Final  . CO2 07/14/2016 24  20 - 31 mmol/L Final  . Glucose, Bld 07/14/2016 216* 65 - 99 mg/dL Final  . BUN 07/14/2016 19  7 - 25 mg/dL Final  . Creat 07/14/2016 0.72  0.50 - 0.99 mg/dL Final   Comment:   For patients > or = 63 years of age: The upper reference limit for Creatinine is approximately 13% higher for people identified  as African-American.     Marland Kitchen  Total Bilirubin 07/14/2016 0.4  0.2 - 1.2 mg/dL Final  . Alkaline Phosphatase 07/14/2016 56  33 - 130 U/L Final  . AST 07/14/2016 20  10 - 35 U/L Final  . ALT 07/14/2016 23  6 - 29 U/L Final  . Total Protein 07/14/2016 7.3  6.1 - 8.1 g/dL Final  . Albumin 07/14/2016 4.0  3.6 - 5.1 g/dL Final  . Calcium 07/14/2016 9.6  8.6 - 10.4 mg/dL Final  . Cholesterol 07/14/2016 150  125 - 200 mg/dL Final  . Triglycerides 07/14/2016 161* <150 mg/dL Final  . HDL 07/14/2016 42* >=46 mg/dL Final  . Total CHOL/HDL Ratio 07/14/2016 3.6  <=5.0 Ratio Final  . VLDL 07/14/2016 32* <30 mg/dL Final  . LDL Cholesterol 07/14/2016 76  <130 mg/dL Final   Comment:   Total Cholesterol/HDL Ratio:CHD Risk                        Coronary Heart Disease Risk Table                                        Men       Women          1/2 Average Risk              3.4        3.3              Average Risk              5.0        4.4           2X Average Risk              9.6        7.1           3X Average Risk             23.4       11.0 Use the calculated Patient Ratio above and the CHD Risk table  to determine the patient's CHD Risk.     RADIOGRAPHIC STUDIES: No results found.  ASSESSMENT/PLAN:    Wrist pain, chronic, right Patient has complaint of right wrist pain with certain movements which include pressing down with her hand and trying to slice things with a knife. She states that sometimes this pain to her right wrist radiates up to her right elbow into her armpit.   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 She states that her husband has mentioned that she may very well have carpal tunnel syndrome to the right wrist.   Exam today revealed that patient had no edema, no redness, and no tenderness to the entire right arm. She had full range of motion with her wrist and shoulder. There was no obvious lymphedema as well.   I  advised patient to follow up with her primary care physician in regards to further workup for possible carpal tunnel syndrome.  Patient states that she already knows an orthopedist; since her husband has just been orthopedist this past week.  She was also advised that she may try over-the-counter ibuprofen as well.  She may want to consider trying a wrist splint to see if this helps as well.      Breast cancer of upper-inner quadrant of  right female breast [facility]) Patient presented to the [facility] for complaint of right arm pain. Her mammogram and ultrasound that was performed in June 2017 revealed a probably benign inflammatory lymph node to the right upper outer breast region. She is due for her next mammogram and ultrasound this month.   With further questioning-patient has complaint with right wrist pain with certain movements which include pressing down with her hand and trying to slice things with a knife. She states that sometimes this pain to her right wrist radiates up to her right elbow into her armpit.   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 She states that her husband has mentioned that she may very well have carpal tunnel syndrome to the right wrist.   Exam today revealed that patient had no edema, no redness, and no tenderness to the entire right arm. She had full range of motion with her wrist and shoulder. There was no obvious lymphedema as well.   However, breast exam reveals that the right breast is much firmer in general than the left. There is a palpable mass approximately 1 cm to the right outer breast, underneath her axilla region; and a larger firm mass directly below this palpable smaller mass. This area is nontender with no redness, warmth, or red streaks.   I saw that you had ordered a routine mammogram and ultrasound to be obtained as soon as possible. We'll you check and make sure that this patient  has a mammogram and ultrasound of this week or next week at all possible.  Patient is scheduled for labs on 08/14/2016 and scheduled for follow-up visit with Dr. Jana Hakim on 08/21/2016.  Patient was advised to call/return or go directly to the emergency department in the meantime with any new worries or concerns whatsoever.   I advised patient to follow up with her primary care physician in regards to further workup for possible carpal tunnel syndrome.      Patient stated understanding of all instructions; and was in agreement with this plan of care. The patient knows to call the clinic with any problems, questions or concerns.   Total time spent with patient was 25 minutes;  with greater than 75 percent of that time spent in face to face counseling regarding patient's symptoms,  and coordination of care and follow up.  Disclaimer:This dictation was prepared with Dragon/digital dictation along with [employer]. Any transcriptional errors that result from this process are unintentional.  Drue Second, NP 08/01/2016

## 2016-08-01 NOTE — Assessment & Plan Note (Signed)
Patient has complaint of right wrist pain with certain movements which include pressing down with her hand and trying to slice things with a knife. She states that sometimes this pain to her right wrist radiates up to her right elbow into her armpit.   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 She states that her husband has mentioned that she may very well have carpal tunnel syndrome to the right wrist.   Exam today revealed that patient had no edema, no redness, and no tenderness to the entire right arm. She had full range of motion with her wrist and shoulder. There was no obvious lymphedema as well.   I advised patient to follow up with her primary care physician in regards to further workup for possible carpal tunnel syndrome.  Patient states that she already knows an orthopedist; since her husband has just been orthopedist this past week.  She was also advised that she may try over-the-counter ibuprofen as well.  She may want to consider trying a wrist splint to see if this helps as well.

## 2016-08-01 NOTE — Assessment & Plan Note (Signed)
Patient presented to the [facility] for complaint of right arm pain. Her mammogram and ultrasound that was performed in June 2017 revealed a probably benign inflammatory lymph node to the right upper outer breast region. She is due for her next mammogram and ultrasound this month.   With further questioning-patient has complaint with right wrist pain with certain movements which include pressing down with her hand and trying to slice things with a knife. She states that sometimes this pain to her right wrist radiates up to her right elbow into her armpit.   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 She states that her husband has mentioned that she may very well have carpal tunnel syndrome to the right wrist.   Exam today revealed that patient had no edema, no redness, and no tenderness to the entire right arm. She had full range of motion with her wrist and shoulder. There was no obvious lymphedema as well.   However, breast exam reveals that the right breast is much firmer in general than the left. There is a palpable mass approximately 1 cm to the right outer breast, underneath her axilla region; and a larger firm mass directly below this palpable smaller mass. This area is nontender with no redness, warmth, or red streaks.   I saw that you had ordered a routine mammogram and ultrasound to be obtained as soon as possible. We'll you check and make sure that this patient has a mammogram and ultrasound of this week or next week at all possible.  Patient is scheduled for labs on 08/14/2016 and scheduled for follow-up visit with Dr. Jana Hakim on 08/21/2016.  Patient was advised to call/return or go directly to the emergency department in the meantime with any new worries or concerns whatsoever.   I advised patient to follow up with her primary care physician in regards to further workup for possible carpal tunnel syndrome.

## 2016-08-03 ENCOUNTER — Other Ambulatory Visit: Payer: Self-pay | Admitting: *Deleted

## 2016-08-03 DIAGNOSIS — C50211 Malignant neoplasm of upper-inner quadrant of right female breast: Secondary | ICD-10-CM

## 2016-08-03 DIAGNOSIS — Z170 Estrogen receptor positive status [ER+]: Principal | ICD-10-CM

## 2016-08-03 DIAGNOSIS — N63 Unspecified lump in unspecified breast: Secondary | ICD-10-CM

## 2016-08-06 ENCOUNTER — Ambulatory Visit
Admission: RE | Admit: 2016-08-06 | Discharge: 2016-08-06 | Disposition: A | Discharge status: Home / Self Care | Payer: BLUE CROSS/BLUE SHIELD | Source: Ambulatory Visit | Attending: Oncology | Admitting: Oncology

## 2016-08-06 DIAGNOSIS — C50211 Malignant neoplasm of upper-inner quadrant of right female breast: Secondary | ICD-10-CM

## 2016-08-06 DIAGNOSIS — N63 Unspecified lump in unspecified breast: Secondary | ICD-10-CM

## 2016-08-06 DIAGNOSIS — Z170 Estrogen receptor positive status [ER+]: Principal | ICD-10-CM

## 2016-08-12 ENCOUNTER — Other Ambulatory Visit: Payer: Self-pay | Admitting: Oncology

## 2016-08-12 DIAGNOSIS — C50211 Malignant neoplasm of upper-inner quadrant of right female breast: Secondary | ICD-10-CM

## 2016-08-14 ENCOUNTER — Other Ambulatory Visit (HOSPITAL_BASED_OUTPATIENT_CLINIC_OR_DEPARTMENT_OTHER): Payer: BLUE CROSS/BLUE SHIELD

## 2016-08-14 DIAGNOSIS — C50411 Malignant neoplasm of upper-outer quadrant of right female breast: Secondary | ICD-10-CM

## 2016-08-14 LAB — COMPREHENSIVE METABOLIC PANEL
ALT: 25 U/L (ref 0–55)
ANION GAP: 8 meq/L (ref 3–11)
AST: 22 U/L (ref 5–34)
Albumin: 3.5 g/dL (ref 3.5–5.0)
Alkaline Phosphatase: 47 U/L (ref 40–150)
BUN: 15.6 mg/dL (ref 7.0–26.0)
CALCIUM: 9.2 mg/dL (ref 8.4–10.4)
CHLORIDE: 106 meq/L (ref 98–109)
CO2: 24 mEq/L (ref 22–29)
CREATININE: 0.8 mg/dL (ref 0.6–1.1)
EGFR: 83 mL/min/{1.73_m2} — AB (ref >90)
Glucose: 208 mg/dl — ABNORMAL HIGH (ref 70–140)
POTASSIUM: 4.1 meq/L (ref 3.5–5.1)
Sodium: 138 mEq/L (ref 136–145)
Total Bilirubin: 0.52 mg/dL (ref 0.20–1.20)
Total Protein: 7 g/dL (ref 6.4–8.3)

## 2016-08-14 LAB — CBC WITH DIFFERENTIAL/PLATELET
BASO%: 0.6 % (ref 0.0–2.0)
BASOS ABS: 0 10*3/uL (ref 0.0–0.1)
EOS ABS: 0.1 10*3/uL (ref 0.0–0.5)
EOS%: 1.3 % (ref 0.0–7.0)
HEMATOCRIT: 38 % (ref 34.8–46.6)
HGB: 12.6 g/dL (ref 11.6–15.9)
LYMPH#: 2 10*3/uL (ref 0.9–3.3)
LYMPH%: 34.2 % (ref 14.0–49.7)
MCH: 31 pg (ref 25.1–34.0)
MCHC: 33.2 g/dL (ref 31.5–36.0)
MCV: 93.2 fL (ref 79.5–101.0)
MONO#: 0.5 10*3/uL (ref 0.1–0.9)
MONO%: 7.8 % (ref 0.0–14.0)
NEUT#: 3.3 10*3/uL (ref 1.5–6.5)
NEUT%: 56.1 % (ref 38.4–76.8)
PLATELETS: 203 10*3/uL (ref 145–400)
RBC: 4.08 10*6/uL (ref 3.70–5.45)
RDW: 13.3 % (ref 11.2–14.5)
WBC: 5.8 10*3/uL (ref 3.9–10.3)

## 2016-08-19 ENCOUNTER — Ambulatory Visit
Admission: RE | Admit: 2016-08-19 | Discharge: 2016-08-19 | Disposition: A | Discharge status: Home / Self Care | Payer: BLUE CROSS/BLUE SHIELD | Source: Ambulatory Visit | Attending: Oncology | Admitting: Oncology

## 2016-08-19 DIAGNOSIS — C50211 Malignant neoplasm of upper-inner quadrant of right female breast: Secondary | ICD-10-CM

## 2016-08-19 MED ORDER — GADOBENATE DIMEGLUMINE 529 MG/ML IV SOLN
13.0000 mL | Freq: Once | INTRAVENOUS | Status: AC | PRN
  Administered 2016-08-19: 13 mL via INTRAVENOUS

## 2016-08-21 ENCOUNTER — Ambulatory Visit (HOSPITAL_BASED_OUTPATIENT_CLINIC_OR_DEPARTMENT_OTHER): Payer: BLUE CROSS/BLUE SHIELD | Admitting: Oncology

## 2016-08-21 VITALS — BP 129/58 | HR 78 | Temp 97.5°F | Resp 18 | Ht 62.0 in | Wt 146.3 lb

## 2016-08-21 DIAGNOSIS — Z170 Estrogen receptor positive status [ER+]: Secondary | ICD-10-CM | POA: Diagnosis not present

## 2016-08-21 DIAGNOSIS — Z79810 Long term (current) use of selective estrogen receptor modulators (SERMs): Secondary | ICD-10-CM

## 2016-08-21 DIAGNOSIS — C50211 Malignant neoplasm of upper-inner quadrant of right female breast: Secondary | ICD-10-CM

## 2016-08-21 NOTE — Progress Notes (Signed)
[facility]  [phone] [phone]     ID: Destiny Moody DOB: 03/25/1951  MR#: [id number]  ZHY#:[id number]  Patient Care Team: Wendie Agreste, MD as PCP - General (Family Medicine) Terrance Mass, MD as Consulting Physician (Gynecology) Fanny Skates, MD as Consulting Physician (General Surgery) Thea Silversmith, MD as Consulting Physician (Radiation Oncology) Holley Bouche, NP as Nurse Practitioner (Nurse Practitioner) Chauncey Cruel, MD as Consulting Physician (Oncology) Sylvan Cheese, NP as Nurse Practitioner (Nurse Practitioner) PCP: Wendie Agreste, MD OTHER MD:  CHIEF COMPLAINT: Estrogen receptor positive breast cancer  CURRENT TREATMENT:  tamoxifen   BREAST CANCER HISTORY: From Dr. Ernestina Penna intake note dated 01/17/2015:  "She had abnormal screening mammogram in September 2015, underwent biopsy on 06/15/2014 which was negative for malignancy. She developed I small hematoma after biopsy, which required drainage afterwards. She finally had a bilateral breast MRI , which showed a 1.7 cm non-mass enhancement in the medial right breast. She had repeated mammogram on 08/19/2014 which showed again non-mass like enhancement in the right breast which was biopsied before. 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 She tolerates the biopsy well without any complications "  Her subsequent history is as detailed below.  INTERVAL HISTORY: Destiny Moody returns today for follow-up of her estrogen receptor positive breast cancer. She tolerates tamoxifen well, with no unusual side effects. She obtains it at a good price.  Since the last visit here she had a restaging MRI of the breasts which shows no evidence of active disease. All we are seeing is scar tissue from her prior procedures.  REVIEW OF SYSTEMS:  Destiny Moody generally does not feel well. She continues to be concerned about  what the area in the right armpit", which she says has been growing. It is tight and with certain activities can be painful. She also has carpal tunnel symptoms in the right wrist and sometimes the right hand area. She is not exercising regularly. She denies fevers, rash, or bleeding. A detailed review of systems today was otherwise stable  PAST MEDICAL HISTORY: Past Medical History:  Diagnosis Date  . Allergy   . Breast cancer ([place]) 08/24/14   right, upper inner  . Cancer ([place])   . Depression    no meds  . Family history of colon cancer   . Family history of ovarian cancer   . HSV infection   . Hypercholesterolemia   . Hypertension   . Hypothyroidism   . Multinodular thyroid   . NIDDM (non-insulin dependent diabetes mellitus)   . Osteopenia   . Osteoporosis 2015  . Radiation 01/31/15-03/01/15   right  breast  . Rheumatoid arthritis(714.0)    lower back  . Wears contact lenses     PAST SURGICAL HISTORY: Past Surgical History:  Procedure Laterality Date  . CESAREAN SECTION  [id number],[id number]   x 2  . CHOLECYSTECTOMY  1993  . COLONOSCOPY     greater than 12 years but not sure where colonoscopy was performed  . INCISION AND DRAINAGE ABSCESS Right 11/24/2014   Procedure: INCISION AND DRAINAGE RIGHT AXILLA  ABSCESS;  Surgeon: Fanny Skates, MD;  Location: [facility];  Service: General;  Laterality: Right;  . RADIOACTIVE SEED GUIDED MASTECTOMY WITH AXILLARY SENTINEL LYMPH NODE BIOPSY Right 10/12/2014   Procedure: RIGHT  PARTIAL MASTECTOMY WITH RADIOACTIVE SEED LOCALIZATION  RIGHT  AXILLARY SENTINEL  NODE BIOPSY;  Surgeon: Fanny Skates, MD;  Location: [facility];  Service: General;  Laterality: Right;  . TUBAL LIGATION  1996   re annastomosis  . WISDOM TOOTH EXTRACTION      FAMILY HISTORY Family History  Problem Relation Age of Onset  . Diabetes Mother     niddm  . Heart disease Mother   . Heart failure Father   . Heart disease Father   . Ovarian cancer Cousin 51  .  Hyperlipidemia Sister   . Colon cancer Maternal Uncle 19  . Esophageal cancer Neg Hx   . Rectal cancer Neg Hx   . Stomach cancer Neg Hx    the patient's father died from a myocardial infarction at age 70. The patient's mother died at age 83 from complications of diabetes. The patient had no brothers, one sister. There is no history of cancer in the immediate family but the patient has one cousin on the mother's side diagnosed with ovarian cancer before the age of 53. Also that cousin's father, the patient's mother's brother, was diagnosed with colon cancer at age 29  GYNECOLOGIC HISTORY:  No LMP recorded. Patient is postmenopausal. Menarche age 77, first live birth age 44. She is GX P2. She went through the change of life approximately age 68. 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 Her husband [name] is in the rental business. The patient's daughter Destiny Moody works at [employer] in [place], and daughter Destiny Moody works at [employer]. The patient has 2 grandchildren. She attends our [place]: Not in place   HEALTH MAINTENANCE: Social History  Substance Use Topics  . Smoking status: Never Smoker  . Smokeless tobacco: Never Used  . Alcohol use 0.0 oz/week     Comment: occasionally liquor     Colonoscopy: Age 14  PAP: Up-to-date  Bone density: Osteoporosis  Lipid panel:  Allergies  Allergen Reactions  . Shellfish Allergy Hives    Current Outpatient Prescriptions  Medication Sig Dispense Refill  . amitriptyline (ELAVIL) 25 MG tablet TAKE ONE TABLET BY MOUTH AT BEDTIME  "OFFICE VISIT NEEDED" 90 tablet 3  . aspirin 81 MG tablet Take 81 mg by mouth daily.    . benzonatate (TESSALON) 100 MG capsule Take 1 capsule (100 mg total) by mouth 2 (two) times daily as needed for cough. (Patient not taking: Reported on 08/01/2016) 30 capsule 0  . Calcium Carbonate-Vitamin  D 600-200 MG-UNIT TABS Take 1,200 mg by mouth.    . fluticasone (FLONASE) 50 MCG/ACT nasal spray Place 1 spray into both nostrils 2 (two) times daily. 16 g 0  . glimepiride (AMARYL) 2 MG tablet TAKE ONE TABLET BY MOUTH ONCE DAILY BEFORE BREAKFAST. 90 tablet 3  . Glucosamine 500 MG CAPS Take 1 capsule by mouth daily.    Marland Kitchen ibuprofen (ADVIL,MOTRIN) 200 MG tablet Take 200 mg by mouth every 6 (six) hours as needed. Reported on 01/24/2016    . lactobacillus acidophilus (BACID) TABS tablet Take 2 tablets by mouth 3 (three) times daily.    Marland Kitchen lisinopril (PRINIVIL,ZESTRIL) 2.5 MG tablet Take 1 tablet (2.5 mg total) by mouth daily. 90 tablet 3  . metFORMIN (GLUCOPHAGE-XR) 500 MG 24 hr tablet Take 2 tablets (1,000 mg total) by mouth 2 (two) times daily. 360 tablet 0  . Multiple Vitamin (MULTIVITAMIN) capsule Take 1 capsule by mouth daily.    . pioglitazone (ACTOS) 45 MG tablet Take 1 tablet (45 mg total) by mouth  daily. Office visit needed for refills 90 tablet 0  . simvastatin (ZOCOR) 20 MG tablet Take 1 tablet (20 mg total) by mouth at bedtime. 90 tablet 3  . tamoxifen (NOLVADEX) 20 MG tablet Take 1 tablet (20 mg total) by mouth daily. 90 tablet 4  . TURMERIC PO Take 1 capsule by mouth daily.     Current Facility-Administered Medications  Medication Dose Route Frequency Provider Last Rate Last Dose  . 0.9 %  sodium chloride infusion  500 mL Intravenous Continuous [name], MD        OBJECTIVE: Middle-aged Latin American woman Who appears stated age  62:   08/21/16 1159  BP: (!) 129/58  Pulse: 78  Resp: 18  Temp: 97.5 F (36.4 C)     Body mass index is 26.76 kg/m.    ECOG FS:2 - Symptomatic, <50% confined to bed  Sclerae unicteric, pupils round and equal Oropharynx clear and moist-- no thrush or other lesions No cervical or supraclavicular adenopathy Lungs no rales or rhonchi Heart regular rate and rhythm Abd soft, nontender, positive bowel sounds MSK no focal spinal tenderness,  no upper extremity lymphedema Neuro: nonfocal, well oriented, appropriate affect Breasts: The right breast is status post lumpectomy and radiation. I do not palpate a suspicious mass. The right axilla is benign. The left breast is unremarkable.  LAB RESULTS:  CMP     Component Value Date/Time   NA 138 08/14/2016 1019   K 4.1 08/14/2016 1019   CL 103 07/14/2016 1434   CO2 24 08/14/2016 1019   GLUCOSE 208 (H) 08/14/2016 1019   BUN 15.6 08/14/2016 1019   CREATININE 0.8 08/14/2016 1019   CALCIUM 9.2 08/14/2016 1019   PROT 7.0 08/14/2016 1019   ALBUMIN 3.5 08/14/2016 1019   AST 22 08/14/2016 1019   ALT 25 08/14/2016 1019   ALKPHOS 47 08/14/2016 1019   BILITOT 0.52 08/14/2016 1019   GFRNONAA 89 09/27/2015 2045   GFRAA >89 09/27/2015 2045    INo results found for: SPEP, UPEP  Lab Results  Component Value Date   WBC 5.8 08/14/2016   NEUTROABS 3.3 08/14/2016   HGB 12.6 08/14/2016   HCT 38.0 08/14/2016   MCV 93.2 08/14/2016   PLT 203 08/14/2016      Chemistry      Component Value Date/Time   NA 138 08/14/2016 1019   K 4.1 08/14/2016 1019   CL 103 07/14/2016 1434   CO2 24 08/14/2016 1019   BUN 15.6 08/14/2016 1019   CREATININE 0.8 08/14/2016 1019      Component Value Date/Time   CALCIUM 9.2 08/14/2016 1019   ALKPHOS 47 08/14/2016 1019   AST 22 08/14/2016 1019   ALT 25 08/14/2016 1019   BILITOT 0.52 08/14/2016 1019       No results found for: LABCA2  No components found for: LABCA125  No results for input(s): INR in the last 168 hours.  Urinalysis    Component Value Date/Time   COLORURINE YELLOW 04/19/2008 1937   APPEARANCEUR CLOUDY (A) 04/19/2008 1937   LABSPEC 1.011 04/19/2008 1937   PHURINE 6.5 04/19/2008 1937   GLUCOSEU NEGATIVE 04/19/2008 1937   HGBUR MODERATE (A) 04/19/2008 1937   BILIRUBINUR negative 09/27/2015 2103   KETONESUR trace (5) (A) 09/27/2015 2103   KETONESUR TRACE (A) 04/19/2008 1937   PROTEINUR negative 09/27/2015 2103   PROTEINUR  30 (A) 04/19/2008 1937   UROBILINOGEN 0.2 09/27/2015 2103   UROBILINOGEN 0.2 04/19/2008 1937   NITRITE Negative  09/27/2015 2103   NITRITE NEGATIVE 04/19/2008 1937   LEUKOCYTESUR Negative 09/27/2015 2103    STUDIES: Mr Breast Bilateral W Wo Contrast  Result Date: 08/20/2016 CLINICAL DATA:  65 year old with malignant lumpectomy of the upper inner quadrant of the right breast in January, 2016, pathology invasive ductal carcinoma at and DCIS with lymphovascular invasion. Patient has a palpable area in the lower outer right breast which she states has gotten larger over the past 6 months, ultrasound demonstrating a likely benign intramammary lymph node. Due to the fact that the palpable area has gotten larger, MRI is requested in further evaluation as the initial cancer was identified on MRI biopsy. LABS:  Not applicable. EXAM: BILATERAL BREAST MRI WITH AND WITHOUT CONTRAST TECHNIQUE: Multiplanar, multisequence MR images of both breasts were obtained prior to and following the intravenous administration of 13 ml of MultiHance. THREE-DIMENSIONAL MR IMAGE RENDERING ON INDEPENDENT WORKSTATION: Three-dimensional MR images were rendered by post-processing of the original MR data on an independent workstation. The three-dimensional MR images were interpreted, and findings are reported in the following complete MRI report for this study. Three dimensional images were evaluated at the independent DynaCad workstation. COMPARISON:  Right breast MRI at the time of biopsy 08/24/2014. Bilateral breast MRI 08/04/2014. Mammography 07/26/2016, 10/13/2015 and earlier. FINDINGS: Breast composition: b. Scattered fibroglandular tissue. Background parenchymal enhancement: Minimal. Right breast: No mass or abnormal enhancement. Skin thickening and interstitial thickening throughout the breast indicating post radiation changes. Scarring at the lumpectomy site in the upper inner breast. Left breast: No mass or abnormal enhancement.  Lymph nodes: No pathologic lymphadenopathy. Normal-appearing 5 mm lymph node in the axillary tail of the right breast. Ancillary findings:  None. IMPRESSION: 1. No MRI evidence of malignancy involving either breast. 2. Expected post lumpectomy and post radiation changes involving the right breast. RECOMMENDATION: Bilateral diagnostic mammography which is due in November, 2018. BI-RADS CATEGORY  2: Benign. Electronically Signed   By: Evangeline Dakin M.D.   On: 08/20/2016 09:52   US Breast Ltd Uni Right Inc Axilla  Result Date: 08/06/2016 CLINICAL DATA:  65 year old female with history of right lumpectomy in January 2016. The patient was last seen in June of 2017 at which time the patient noted a palpable abnormality in the upper, outer right breast. A probable reactive lymph node was identified on the ultrasound for which short-term follow-up was recommended. The patient feels that this area may be slightly larger than in June. EXAM: 2D DIGITAL DIAGNOSTIC BILATERAL MAMMOGRAM WITH CAD AND ADJUNCT TOMO ULTRASOUND RIGHT BREAST COMPARISON:  Previous exam(s). ACR Breast Density Category c: The breast tissue is heterogeneously dense, which may obscure small masses. FINDINGS: Posttreatment changes are again noted of the right breast. No suspicious mass, calcifications, or other abnormality is identified within either breast. Mammographic images were processed with CAD. On physical exam, there is a palpable subcentimeter nodule in the patient's area of concern within the right breast at 9:30, 9 cm from the nipple. Targeted ultrasound is performed, showing a similar-appearing 5 mm lymph node at 9:30, 9 cm from the nipple. Just deep to this lymph node, there is a vague hypoechoic area which appears similar in size to the prior exam. When included in the measurement of the lymph node, this area measures 6 x 6 x 6 mm, similar to prior exam. It is unclear if this vague hypoechoic area represents a true finding. IMPRESSION:  Probably benign right breast finding at 9:30, 9 cm from the nipple. RECOMMENDATION: As the patient feels that  the palpable area within the upper, outer right breast may be slightly larger than before, possible further evaluation with bilateral breast MRI or ultrasound-guided biopsy were discussed with the patient. The patient wishes to pursue bilateral breast MRI. A bilateral breast MRI with contrast is recommended for further evaluation. If the patient does not decide to pursue MRI or biopsy, a right diagnostic mammogram and ultrasound would be recommended in 6 months. I have discussed the findings and recommendations with the patient. Results were also provided in writing at the conclusion of the visit. If applicable, a reminder letter will be sent to the patient regarding the next appointment. BI-RADS CATEGORY  0: Incomplete. Need additional imaging evaluation and/or prior mammograms for comparison. Electronically Signed   By: Pamelia Hoit M.D.   On: 08/06/2016 16:17   Mm Diag Breast Tomo Bilateral  Result Date: 08/06/2016 CLINICAL DATA:  65 year old female with history of right lumpectomy in January 2016. The patient was last seen in June of 2017 at which time the patient noted a palpable abnormality in the upper, outer right breast. A probable reactive lymph node was identified on the ultrasound for which short-term follow-up was recommended. The patient feels that this area may be slightly larger than in June. EXAM: 2D DIGITAL DIAGNOSTIC BILATERAL MAMMOGRAM WITH CAD AND ADJUNCT TOMO ULTRASOUND RIGHT BREAST COMPARISON:  Previous exam(s). ACR Breast Density Category c: The breast tissue is heterogeneously dense, which may obscure small masses. FINDINGS: Posttreatment changes are again noted of the right breast. No suspicious mass, calcifications, or other abnormality is identified within either breast. Mammographic images were processed with CAD. On physical exam, there is a palpable subcentimeter nodule in  the patient's area of concern within the right breast at 9:30, 9 cm from the nipple. Targeted ultrasound is performed, showing a similar-appearing 5 mm lymph node at 9:30, 9 cm from the nipple. Just deep to this lymph node, there is a vague hypoechoic area which appears similar in size to the prior exam. When included in the measurement of the lymph node, this area measures 6 x 6 x 6 mm, similar to prior exam. It is unclear if this vague hypoechoic area represents a true finding. IMPRESSION: Probably benign right breast finding at 9:30, 9 cm from the nipple. RECOMMENDATION: As the patient feels that the palpable area within the upper, outer right breast may be slightly larger than before, possible further evaluation with bilateral breast MRI or ultrasound-guided biopsy were discussed with the patient. The patient wishes to pursue bilateral breast MRI. A bilateral breast MRI with contrast is recommended for further evaluation. If the patient does not decide to pursue MRI or biopsy, a right diagnostic mammogram and ultrasound would be recommended in 6 months. I have discussed the findings and recommendations with the patient. Results were also provided in writing at the conclusion of the visit. If applicable, a reminder letter will be sent to the patient regarding the next appointment. BI-RADS CATEGORY  0: Incomplete. Need additional imaging evaluation and/or prior mammograms for comparison. Electronically Signed   By: Pamelia Hoit M.D.   On: 08/06/2016 16:17     ASSESSMENT: 65 y.o. BRCA negative  woman status post right breast upper inner quadrant biopsy 08/24/2014 for a clinical T1c N0, stage IA invasive ductal carcinoma, grade 1 or 2, strongly estrogen and progesterone receptor positive, with an MIB-1 of 12%, and no HER-2 amplification  (1) status post right lumpectomy and sentinel lymph node sampling 10/12/2014 for a pT1c pN0, stage IA invasive  ductal carcinoma, grade 1, with ample margins; repeat  HER-2 again negative  (2) Adjuvant radiation 01/31/2015-03/01/2015:  Right breast/ 42.72 Gy at 2.67 Gy per fraction x 21 fractions.  Right breast boost/ 10 Gy at 2 Gy per fraction x 5 fractions  (3) started tamoxifen 05/19/2015  (4) genetic testing 04/26/2015 through the [employer] gene panel offered by [facility] found no deleterious mutations in ATM, BARD1, BRCA1, BRCA2, BRIP1, CDH1, CHEK2, EPCAM, MLH1, MRE11A, MSH2, MSH6, MUTYH, NBN, NF1, PALB2, PMS2, PTEN, RAD50, RAD51C, RAD51D, SMARCA4, STK11, and TP53.   PLAN: Destiny Moody  is just about 2 years out from definitive surgery for her breast cancer with no evidence of disease recurrence. This is very favorable.  She is tolerating the tamoxifen well and the plan will be to continue that for a total of 5 years.  I think she would benefit from physical therapy to her right upper extremity and I am putting that description in. She may need eventual referral to a hand surgeon for her carpal tunnel symptoms.  She remains very concerned about her breast cancer history because many of her friends have died from breast cancer. However I reassured her that as of now after the MRI we see no evidence of disease activity  Otherwise she will return to see me in 6 months. She knows to call for any problems that may develop before that visit Chauncey Cruel, MD   08/21/2016 12:24 PM Medical Oncology and Hematology [facility] [place], [place] [address] Tel. [phone]    Fax. [phone]

## 2016-08-22 ENCOUNTER — Ambulatory Visit: Payer: BLUE CROSS/BLUE SHIELD | Attending: Oncology | Admitting: Physical Therapy

## 2016-08-22 DIAGNOSIS — M25511 Pain in right shoulder: Secondary | ICD-10-CM | POA: Diagnosis present

## 2016-08-22 DIAGNOSIS — M25611 Stiffness of right shoulder, not elsewhere classified: Secondary | ICD-10-CM

## 2016-08-22 DIAGNOSIS — G8929 Other chronic pain: Secondary | ICD-10-CM | POA: Diagnosis present

## 2016-08-22 NOTE — Therapy (Signed)
[place], [place], [address] Phone: [phone]   Fax:  [phone]  Physical Therapy Evaluation  Patient Details  Name: Destiny Moody MRN: [id number] Date of Birth: [birth year] Referring Provider: Dr. Lurline Del  Encounter Date: 08/22/2016      PT End of Session - 08/22/16 1201    Visit Number 1   Number of Visits 9   Date for PT Re-Evaluation 09/28/16   PT Start Time [id number]   PT Stop Time 1106   PT Time Calculation (min) 51 min   Activity Tolerance Patient tolerated treatment well   Behavior During Therapy [facility] for tasks assessed/performed      Past Medical History:  Diagnosis Date  . Allergy   . Breast cancer ([place]) 08/24/14   right, upper inner  . Cancer ([place])   . Depression    no meds  . Family history of colon cancer   . Family history of ovarian cancer   . HSV infection   . Hypercholesterolemia   . Hypertension   . Hypothyroidism   . Multinodular thyroid   . NIDDM (non-insulin dependent diabetes mellitus)   . Osteopenia   . Osteoporosis 2015  . Radiation 01/31/15-03/01/15   right  breast  . Rheumatoid arthritis(714.0)    lower back  . Wears contact lenses     Past Surgical History:  Procedure Laterality Date  . CESAREAN SECTION  [id number]   x 2  . CHOLECYSTECTOMY  1993  . COLONOSCOPY     greater than 12 years but not sure where colonoscopy was performed  . INCISION AND DRAINAGE ABSCESS Right 11/24/2014   Procedure: INCISION AND DRAINAGE RIGHT AXILLA  ABSCESS;  Surgeon: Fanny Skates, MD;  Location: [place];  Service: General;  Laterality: Right;  . RADIOACTIVE SEED GUIDED MASTECTOMY WITH AXILLARY SENTINEL LYMPH NODE BIOPSY Right 10/12/2014   Procedure: RIGHT  PARTIAL MASTECTOMY WITH RADIOACTIVE SEED LOCALIZATION  RIGHT  AXILLARY SENTINEL  NODE BIOPSY;  Surgeon: Fanny Skates, MD;  Location: [place];  Service: General;  Laterality: Right;  . TUBAL LIGATION   1996   re annastomosis  . WISDOM TOOTH EXTRACTION      There were no vitals filed for this visit.       Subjective Assessment - 08/22/16 1026    Subjective "I got problems with (right underarm) since surgery." [facility].  Has pain at right flank inferior to axilla a lot. Has a bump there; had a mammogram and [place] there (cancer was ruled out).  She is not working right now. Says she has trouble making a fist sometimes with right hand; saw a rhematologist and they did testing about carpal tunnel syndrome; she will get the results of this on 12/29.  They are trying to sort out effects of simvastatin, tamoxifen, or some other cause, including possibly something in the axilla.   Pertinent History status post right lumpectomy and sentinel lymph node sampling 10/12/2014 for a pT1c pN0, stage IA invasive ductal carcinoma, grade 1, with ample margins.  Had to go back in due to an infection in 11/23/14.  Patient reports she had to insist on care for this infection prior to that second surgery, in which the infection was cleaned.  Had XRT that was finished 05/2015.  On tamoxifen.  Diabetes.  Patient is unsure why she is on lisinopril.     Patient Stated Goals get out of  her pain   Currently in Pain? Yes   Pain Score 4   up to 10 sometimes   Pain Location Axilla   Pain Orientation Right   Pain Descriptors / Indicators Sharp   Pain Onset More than a month ago   Aggravating Factors  using the arm, cleaning the house   Pain Relieving Factors resting the arm, tylenol   Multiple Pain Sites Yes   Pain Location Leg   Pain Orientation Right   Aggravating Factors  lifting leg into car   Pain Relieving Factors exercise for the leg            [facility] PT Assessment - 08/22/16 0001      Assessment   Medical Diagnosis right breast cancer s/p lumpectomy and XRT   Referring Provider Dr. Sarajane Jews Magrinat   Onset Date/Surgical Date 10/12/14   Hand Dominance  Right   Prior Therapy none     Precautions   Precautions Other (comment)   Precaution Comments cancer precautions     Restrictions   Weight Bearing Restrictions No     Balance Screen   Has the patient fallen in the past 6 months No  but is very careful   Has the patient had a decrease in activity level because of a fear of falling?  No   Is the patient reluctant to leave their home because of a fear of falling?  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     Observation/Other Assessments   Observations no cording seen on active abduction   Skin Integrity both SLNB and breast incisions are well healed (breast incision superior to nipple)     ROM / Strength   AROM / PROM / Strength AROM     AROM   AROM Assessment Site Shoulder   Right/Left Shoulder Right;Left   Right Shoulder Flexion 146 Degrees   Right Shoulder ABduction 159 Degrees   Right Shoulder Internal Rotation 70 Degrees  in supine   Right Shoulder External Rotation 76 Degrees  in supine; uncomfortable   Left Shoulder Flexion 163 Degrees   Left Shoulder ABduction 180 Degrees   Left Shoulder Internal Rotation 80 Degrees   Left Shoulder External Rotation 90 Degrees     Strength   Overall Strength Comments right shoulder flexion and abduction 4+/5, rotators 5/5; left shoulder 5/5 throughout     Palpation   Palpation comment Patient locates for therapist a lump at inferior right axilla/lateral right breast; lump is palpable, and patient says this is where it is sore, in addition to further up in the axilla     Ambulation/Gait   Ambulation/Gait Yes    Ambulation/Gait Assistance 7: Independent           LYMPHEDEMA/ONCOLOGY QUESTIONNAIRE - 08/22/16 1056      Type   Cancer Type right breast cancer     Surgeries   Lumpectomy Date 10/12/14   Sentinel Lymph Node Biopsy Date 10/12/14   Number Lymph Nodes Removed --  SLNB     Treatment   Past Radiation Treatment Yes   Date 05/19/15  approx.   Current Hormone Treatment Yes   Drug Name tamoxifen     Right Upper Extremity Lymphedema   10 cm Proximal to Olecranon Process 28 cm   Olecranon Process 24 cm   10 cm Proximal to Ulnar Styloid Process 21.2 cm   Just Proximal to Ulnar Styloid Process 16.9 cm   Across Hand at [employer] 19.8 cm   At [place] of 2nd Digit 6.7 cm     Left Upper Extremity Lymphedema   10 cm Proximal to Olecranon Process 28.6 cm   Olecranon Process 23.8 cm   10 cm Proximal to Ulnar Styloid Process 21.3 cm   Just Proximal to Ulnar Styloid Process 17.3 cm   Across Hand at [employer] 19.3 cm   At [place] of 2nd Digit 6.8 cm                                Long Term Clinic Goals - 08/22/16 1212      CC Long Term Goal  #1   Title Pt. will report at least 50% decrease in right shoulder/flank area of pain.   Time 4   Period Weeks   Status New     CC Long Term Goal  #2   Title Patient will show active right shoulder flexion to at least 155 degrees   Baseline 146 degrees at eval compared to 163 on left   Time 4   Period Weeks   Status New     CC Long Term Goal  #3   Title right shoulder active abduction to at least 170 degrees   Baseline 159 at eval compared to 180 on left   Time 4   Period Weeks   Status New     CC Long Term Goal  #4   Title independent with HEP for shoulder ROM and strengthening   Time 4   Period Weeks   Status New            Plan - 08/22/16 1205    Clinical Impression Statement Patient with right breast cancer with lumpectomy nearly two years ago who is now having pain in right axilla/upper  flank area.  She has limited AROM in this shoulder and limited function with this arm due to pain.  Her strength is also decreased in right shoulder flexion and abduction.  Arm circumference measurements show little difference right to left sides.  She has a palpable lump at right lateral breast that she seems concerned about, but she has been told it is benign.  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   Rehab Potential Excellent   Clinical Impairments Affecting Rehab Potential none   PT Frequency 2x / week   PT Duration 4 weeks   PT Treatment/Interventions ADLs/Self Care Home Management;Electrical Stimulation;Therapeutic exercise;Patient/family education;Manual techniques;Manual lymph drainage;Scar mobilization;Passive range of motion   PT Next Visit Plan Begin P/AA/A/ROM to right shoulder with instruction in HEP; include myofascial work to right upper quadrant.   Consulted and Agree with Plan of Care Patient      Patient will benefit from skilled therapeutic intervention in order to improve the following deficits and impairments:  Decreased range of motion, Decreased strength, Pain  Visit Diagnosis: Stiffness of right shoulder, not elsewhere classified - Plan: PT plan  of care cert/re-cert  Chronic right shoulder pain - Plan: PT plan of care cert/re-cert     Problem List Patient Active Problem List   Diagnosis Date Noted  . Wrist pain, chronic, right 08/01/2016  . Genetic testing 04/27/2015  . Family history of colon cancer   . Family history of ovarian cancer   . Diabetes mellitus type II, non insulin dependent ([place]) 03/31/2015  . Breast cancer of upper-inner quadrant of right female breast ([place]) 08/26/2014  . Osteoporosis 08/10/2014  . Vaginal atrophy 04/27/2014  . GERD 04/07/2008  . FATTY LIVER DISEASE 04/07/2008    SALISBURY,DONNA 08/22/2016, 12:18  PM  [place], [place], [address] Phone: [phone]   Fax:  [phone]  Name: Destiny Moody MRN: [id number] Date of Birth: 01/17/1951  Serafina Royals, PT 08/22/16 12:19 PM

## 2016-08-23 ENCOUNTER — Ambulatory Visit: Payer: BLUE CROSS/BLUE SHIELD

## 2016-08-23 DIAGNOSIS — M25611 Stiffness of right shoulder, not elsewhere classified: Secondary | ICD-10-CM | POA: Diagnosis not present

## 2016-08-23 DIAGNOSIS — G8929 Other chronic pain: Secondary | ICD-10-CM

## 2016-08-23 DIAGNOSIS — M25511 Pain in right shoulder: Secondary | ICD-10-CM

## 2016-08-23 NOTE — Therapy (Signed)
[place], [place], [address] Phone: [phone]   Fax:  [phone]  Physical Therapy Treatment  Patient Details  Name: Destiny Moody MRN: [id number] Date of Birth: 1951/04/16 Referring Provider: Dr. Lurline Del  Encounter Date: 08/23/2016      PT End of Session - 08/23/16 1102    Visit Number 2   Number of Visits 9   Date for PT Re-Evaluation 09/28/16   PT Start Time 1020   PT Stop Time 1101   PT Time Calculation (min) 41 min   Activity Tolerance Patient tolerated treatment well   Behavior During Therapy [facility] for tasks assessed/performed      Past Medical History:  Diagnosis Date  . Allergy   . Breast cancer ([place]) 08/24/14   right, upper inner  . Cancer ([place])   . Depression    no meds  . Family history of colon cancer   . Family history of ovarian cancer   . HSV infection   . Hypercholesterolemia   . Hypertension   . Hypothyroidism   . Multinodular thyroid   . NIDDM (non-insulin dependent diabetes mellitus)   . Osteopenia   . Osteoporosis 2015  . Radiation 01/31/15-03/01/15   right  breast  . Rheumatoid arthritis(714.0)    lower back  . Wears contact lenses     Past Surgical History:  Procedure Laterality Date  . CESAREAN SECTION  [id number]   x 2  . CHOLECYSTECTOMY  1993  . COLONOSCOPY     greater than 12 years but not sure where colonoscopy was performed  . INCISION AND DRAINAGE ABSCESS Right 11/24/2014   Procedure: INCISION AND DRAINAGE RIGHT AXILLA  ABSCESS;  Surgeon: Fanny Skates, MD;  Location: [place];  Service: General;  Laterality: Right;  . RADIOACTIVE SEED GUIDED MASTECTOMY WITH AXILLARY SENTINEL LYMPH NODE BIOPSY Right 10/12/2014   Procedure: RIGHT  PARTIAL MASTECTOMY WITH RADIOACTIVE SEED LOCALIZATION  RIGHT  AXILLARY SENTINEL  NODE BIOPSY;  Surgeon: Fanny Skates, MD;  Location: [place];  Service: General;  Laterality: Right;  . TUBAL LIGATION   1996   re annastomosis  . WISDOM TOOTH EXTRACTION      There were no vitals filed for this visit.      Subjective Assessment - 08/23/16 1024    Subjective Not having any pain right now in my Rt shoulder. It really just hurts me when I am doing things around the house.    Pertinent History status post right lumpectomy and sentinel lymph node sampling 10/12/2014 for a pT1c pN0, stage IA invasive ductal carcinoma, grade 1, with ample margins.  Had to go back in due to an infection in 11/23/14.  Patient reports she had to insist on care for this infection prior to that second surgery, in which the infection was cleaned.  Had XRT that was finished 05/2015.  On tamoxifen.  Diabetes.  Patient is unsure why she is on lisinopril.     Patient Stated Goals get out of her pain   Currently in Pain? No/denies               LYMPHEDEMA/ONCOLOGY QUESTIONNAIRE - 08/22/16 1056      Type   Cancer Type right breast cancer     Surgeries   Lumpectomy Date 10/12/14   Sentinel Lymph Node Biopsy Date 10/12/14   Number Lymph Nodes Removed --  SLNB     Treatment   Past Radiation Treatment Yes   Date 05/19/15  approx.   Current Hormone Treatment Yes   Drug Name tamoxifen     Right Upper Extremity Lymphedema   10 cm Proximal to Olecranon Process 28 cm   Olecranon Process 24 cm   10 cm Proximal to Ulnar Styloid Process 21.2 cm   Just Proximal to Ulnar Styloid Process 16.9 cm   Across Hand at [employer] 19.8 cm   At [place] of 2nd Digit 6.7 cm     Left Upper Extremity Lymphedema   10 cm Proximal to Olecranon Process 28.6 cm   Olecranon Process 23.8 cm   10 cm Proximal to Ulnar Styloid Process 21.3 cm   Just Proximal to Ulnar Styloid Process 17.3 cm   Across Hand at [employer] 19.3 cm   At [place] of 2nd Digit 6.8 cm                  [facility] Adult PT Treatment/Exercise - 08/23/16 0001      Shoulder Exercises: Pulleys   Flexion 2 minutes   Flexion Limitations VC to relax  throughout   ABduction 2 minutes   ABduction Limitations VC to decrease bil side trunk lean     Shoulder Exercises: Therapy Ball   Flexion 10 reps  With forward lean into top of stretch   Flexion Limitations Pt needed max VC not to push into pain     Shoulder Exercises: ROM/Strengthening   Other ROM/Strengthening Exercises Finger Ladder for Rt UE abduction 7 times, had to instruct pt throughout to not push into pain     Manual Therapy   Manual Therapy Passive ROM;Myofascial release   Myofascial Release To Rt chest wall consurrently during P/ROM   Passive ROM In Supine to Rt shoulder into flexion, abduction and er all to pts tolerance                        Long Term Clinic Goals - 08/22/16 1212      CC Long Term Goal  #1   Title Pt. will report at least 50% decrease in right shoulder/flank area of pain.   Time 4   Period Weeks   Status New     CC Long Term Goal  #2   Title Patient will show active right shoulder flexion to at least 155 degrees   Baseline 146 degrees at eval compared to 163 on left   Time 4   Period Weeks   Status New     CC Long Term Goal  #3   Title right shoulder active abduction to at least 170 degrees   Baseline 159 at eval compared to 180 on left   Time 4   Period Weeks   Status New     CC Long Term Goal  #4   Title independent with HEP for shoulder ROM and strengthening   Time 4   Period Weeks   Status New            Plan - 08/23/16 1104    Clinical Impression Statement Pt was having some pain with AA/ROM sttching today and required constant VC to not push into pain at end of ranges. She tolerated P/ROM well without any increase pain after but did also require VC with this to relax which she struggled with for whole time. She is to let us know if she had any increase pain later today.    Rehab Potential Excellent   Clinical Impairments Affecting Rehab Potential none  PT Frequency 2x / week   PT Duration 4 weeks   PT  Treatment/Interventions ADLs/Self Care Home Management;Electrical Stimulation;Therapeutic exercise;Patient/family education;Manual techniques;Manual lymph drainage;Scar mobilization;Passive range of motion   PT Next Visit Plan Cont P/AA/A/ROM to right shoulder with instruction in HEP reminding pt not to push into end ROM pain; include myofascial work to right upper quadrant.   Consulted and Agree with Plan of Care Patient      Patient will benefit from skilled therapeutic intervention in order to improve the following deficits and impairments:  Decreased range of motion, Decreased strength, Pain  Visit Diagnosis: Stiffness of right shoulder, not elsewhere classified  Chronic right shoulder pain     Problem List Patient Active Problem List   Diagnosis Date Noted  . Wrist pain, chronic, right 08/01/2016  . Genetic testing 04/27/2015  . Family history of colon cancer   . Family history of ovarian cancer   . Diabetes mellitus type II, non insulin dependent ([place]) 03/31/2015  . Breast cancer of upper-inner quadrant of right female breast ([place]) 08/26/2014  . Osteoporosis 08/10/2014  . Vaginal atrophy 04/27/2014  . GERD 04/07/2008  . FATTY LIVER DISEASE 04/07/2008    Otelia Limes, PTA 08/23/2016, 11:06 AM  [place], [place], [address] Phone: [phone]   Fax:  [phone]  Name: Destiny Moody MRN: [id number] Date of Birth: 1951-07-16

## 2016-08-28 ENCOUNTER — Ambulatory Visit: Payer: BLUE CROSS/BLUE SHIELD

## 2016-08-28 DIAGNOSIS — M25611 Stiffness of right shoulder, not elsewhere classified: Secondary | ICD-10-CM

## 2016-08-28 DIAGNOSIS — M25511 Pain in right shoulder: Secondary | ICD-10-CM

## 2016-08-28 DIAGNOSIS — G8929 Other chronic pain: Secondary | ICD-10-CM

## 2016-08-28 NOTE — Patient Instructions (Signed)
Over Head Pull: Narrow and Wide  Grip     Cancer Rehab [phone]   On back, knees bent, feet flat, band across thighs, elbows straight but relaxed. Pull hands apart (start). Keeping elbows straight, bring arms up and over head, hands toward floor. Keep pull steady on band. Hold momentarily. Return slowly, keeping pull steady, back to start. Then do with a Wider Grip. Repeat _10__ times. Band color __yellow____   Side Pull: Double Arm   On back, knees bent, feet flat. Arms perpendicular to body, shoulder level, elbows straight but relaxed. Pull arms out to sides, elbows straight (keeping band ABOVE chest). Resistance band comes across collarbones, hands toward floor. Hold momentarily. Slowly return to starting position. Repeat _10__ times. Band color _yellow____   Sword   On back, knees bent, feet flat, left hand on left hip, right hand above left. Pull right arm DIAGONALLY (hip to shoulder) across chest. Bring right arm along head toward floor. Hold momentarily. Slowly return to starting position. Repeat _10__ times. Do with left arm. Band color __yellow____   Shoulder Rotation: Double Arm   On back, knees bent, feet flat, elbows tucked at sides, bent 90, hands palms up (keeping the band BELOW the chest). Pull hands apart and down toward floor, keeping elbows near sides. Hold momentarily. Slowly return to starting position. Repeat _10__ times. Band color _yellow_____

## 2016-08-28 NOTE — Therapy (Signed)
[place], [place], [address] Phone: [phone]   Fax:  [phone]  Physical Therapy Treatment  Patient Details  Name: Destiny Moody MRN: [id number] Date of Birth: 1950/10/22 Referring Provider: Dr. Lurline Del  Encounter Date: 08/28/2016      PT End of Session - 08/28/16 1109    Visit Number 3   Number of Visits 9   Date for PT Re-Evaluation 09/28/16   PT Start Time 1026   PT Stop Time 1108   PT Time Calculation (min) 42 min   Activity Tolerance Patient tolerated treatment well   Behavior During Therapy [facility] for tasks assessed/performed      Past Medical History:  Diagnosis Date  . Allergy   . Breast cancer ([place]) 08/24/14   right, upper inner  . Cancer ([facility])   . Depression    no meds  . Family history of colon cancer   . Family history of ovarian cancer   . HSV infection   . Hypercholesterolemia   . Hypertension   . Hypothyroidism   . Multinodular thyroid   . NIDDM (non-insulin dependent diabetes mellitus)   . Osteopenia   . Osteoporosis 2015  . Radiation 01/31/15-03/01/15   right  breast  . Rheumatoid arthritis(714.0)    lower back  . Wears contact lenses     Past Surgical History:  Procedure Laterality Date  . CESAREAN SECTION  [id number]   x 2  . CHOLECYSTECTOMY  1993  . COLONOSCOPY     greater than 12 years but not sure where colonoscopy was performed  . INCISION AND DRAINAGE ABSCESS Right 11/24/2014   Procedure: INCISION AND DRAINAGE RIGHT AXILLA  ABSCESS;  Surgeon: Fanny Skates, MD;  Location: [place];  Service: General;  Laterality: Right;  . RADIOACTIVE SEED GUIDED MASTECTOMY WITH AXILLARY SENTINEL LYMPH NODE BIOPSY Right 10/12/2014   Procedure: RIGHT  PARTIAL MASTECTOMY WITH RADIOACTIVE SEED LOCALIZATION  RIGHT  AXILLARY SENTINEL  NODE BIOPSY;  Surgeon: Fanny Skates, MD;  Location: [place];  Service: General;  Laterality: Right;  . TUBAL LIGATION   1996   re annastomosis  . WISDOM TOOTH EXTRACTION      There were no vitals filed for this visit.      Subjective Assessment - 08/28/16 1035    Subjective You were right about me not relaxing, I was really sore for 2 days after last visit. I will try to do better today. But I feel like I can move my arm better. My neck is sore today but I think I slept on it wrong.    Pertinent History status post right lumpectomy and sentinel lymph node sampling 10/12/2014 for a pT1c pN0, stage IA invasive ductal carcinoma, grade 1, with ample margins.  Had to go back in due to an infection in 11/23/14.  Patient reports she had to insist on care for this infection prior to that second surgery, in which the infection was cleaned.  Had XRT that was finished 05/2015.  On tamoxifen.  Diabetes.  Patient is unsure why she is on lisinopril.     Patient Stated Goals get out of her pain   Currently in Pain? No/denies            [facility] PT Assessment - 08/28/16 0001      AROM   Right Shoulder Flexion 151 Degrees   Right Shoulder ABduction 160 Degrees  [facility] Adult PT Treatment/Exercise - 08/28/16 0001      Shoulder Exercises: Supine   Horizontal ABduction Strengthening;Both;10 reps;Theraband   Theraband Level (Shoulder Horizontal ABduction) Level 1 (Yellow)   External Rotation Strengthening;Both;10 reps;Theraband   Theraband Level (Shoulder External Rotation) Level 1 (Yellow)   Flexion Strengthening;Both;10 reps;Theraband  Narrow and Wide Grip, 10 times each   Theraband Level (Shoulder Flexion) Level 1 (Yellow)   Other Supine Exercises Bil D2 with yellow theraband 10 times each with pt returning therapist demonstration     Shoulder Exercises: Pulleys   Flexion 2 minutes   Flexion Limitations VC to relax throughout   ABduction 2 minutes     Shoulder Exercises: Therapy Ball   Flexion 10 reps  With forward lean into top of stretch   Flexion Limitations Pt did well with  this today.     Shoulder Exercises: ROM/Strengthening   Other ROM/Strengthening Exercises Finger Ladder for Rt UE abduction 10 times and pt with no pain with this today.     Shoulder Exercises: Stretch   Corner Stretch 3 reps;10 seconds   Corner Stretch Limitations Pt returned therapist demonstration with VC to not push into pain.     Manual Therapy   Manual Therapy Passive ROM   Passive ROM In Supine to Rt shoulder into flexion, abduction and er all to pts tolerance                PT Education - 08/28/16 1053    Education provided Yes   Education Details Supine scapular series with yellow theraband   Person(s) Educated Patient   Methods Explanation;Demonstration;Handout   Comprehension Verbalized understanding;Returned demonstration;Need further instruction                [facility] Goals - 08/22/16 1212      CC Long Term Goal  #1   Title Pt. will report at least 50% decrease in right shoulder/flank area of pain.   Time 4   Period Weeks   Status New     CC Long Term Goal  #2   Title Patient will show active right shoulder flexion to at least 155 degrees   Baseline 146 degrees at eval compared to 163 on left   Time 4   Period Weeks   Status New     CC Long Term Goal  #3   Title right shoulder active abduction to at least 170 degrees   Baseline 159 at eval compared to 180 on left   Time 4   Period Weeks   Status New     CC Long Term Goal  #4   Title independent with HEP for shoulder ROM and strengthening   Time 4   Period Weeks   Status New            Plan - 08/28/16 1115    Clinical Impression Statement Pt did much better today with AA/ROM by not pushing into pain and even reporting feeling minimal-no painwith stretches today. Also did better with decreasing scapular compensations.Her A/ROM has iproved with flexion and was about the same with abduction but she was performing today with much less scapular compensations. She also did well  with progression of HEP.    Rehab Potential Excellent   Clinical Impairments Affecting Rehab Potential none   PT Frequency 2x / week   PT Duration 4 weeks   PT Treatment/Interventions ADLs/Self Care Home Management;Electrical Stimulation;Therapeutic exercise;Patient/family education;Manual techniques;Manual lymph drainage;Scar mobilization;Passive range of motion   PT  Next Visit Plan Cont P/AA/A/ROM to right shoulder; review HEP reminding pt not to push into end ROM pain; include myofascial work to right upper quadrant.   Consulted and Agree with Plan of Care Patient      Patient will benefit from skilled therapeutic intervention in order to improve the following deficits and impairments:  Decreased range of motion, Decreased strength, Pain  Visit Diagnosis: Stiffness of right shoulder, not elsewhere classified  Chronic right shoulder pain     Problem List Patient Active Problem List   Diagnosis Date Noted  . Wrist pain, chronic, right 08/01/2016  . Genetic testing 04/27/2015  . Family history of colon cancer   . Family history of ovarian cancer   . Diabetes mellitus type II, non insulin dependent ([place]) 03/31/2015  . Breast cancer of upper-inner quadrant of right female breast ([place]) 08/26/2014  . Osteoporosis 08/10/2014  . Vaginal atrophy 04/27/2014  . GERD 04/07/2008  . FATTY LIVER DISEASE 04/07/2008    Otelia Limes, PTA 08/28/2016, 11:20 AM  [place], [place], [address] Phone: [phone]   Fax:  [phone]  Name: Destiny Moody MRN: [id number] Date of Birth: [birth year]

## 2016-08-31 ENCOUNTER — Ambulatory Visit: Payer: BLUE CROSS/BLUE SHIELD | Admitting: Physical Therapy

## 2016-08-31 DIAGNOSIS — M25511 Pain in right shoulder: Secondary | ICD-10-CM

## 2016-08-31 DIAGNOSIS — M25611 Stiffness of right shoulder, not elsewhere classified: Secondary | ICD-10-CM

## 2016-08-31 DIAGNOSIS — G8929 Other chronic pain: Secondary | ICD-10-CM

## 2016-08-31 NOTE — Telephone Encounter (Signed)
No entry 

## 2016-08-31 NOTE — Therapy (Signed)
[place], [place], [address] Phone: [phone]   Fax:  [phone]  Physical Therapy Treatment  Patient Details  Name: Alynda Loren MRN: [id number] Date of Birth: 05-02-1951 Referring Provider: Dr. Lurline Del  Encounter Date: 08/31/2016      PT End of Session - 08/31/16 1803    Visit Number 4   Number of Visits 9   Date for PT Re-Evaluation 09/28/16   PT Start Time 0934   PT Stop Time 1017   PT Time Calculation (min) 43 min   Activity Tolerance Patient tolerated treatment well;Patient limited by pain   Behavior During Therapy [facility] for tasks assessed/performed      Past Medical History:  Diagnosis Date  . Allergy   . Breast cancer ([place]) 08/24/14   right, upper inner  . Cancer ([place])   . Depression    no meds  . Family history of colon cancer   . Family history of ovarian cancer   . HSV infection   . Hypercholesterolemia   . Hypertension   . Hypothyroidism   . Multinodular thyroid   . NIDDM (non-insulin dependent diabetes mellitus)   . Osteopenia   . Osteoporosis 2015  . Radiation 01/31/15-03/01/15   right  breast  . Rheumatoid arthritis(714.0)    lower back  . Wears contact lenses     Past Surgical History:  Procedure Laterality Date  . CESAREAN SECTION  [id number]   x 2  . CHOLECYSTECTOMY  1993  . COLONOSCOPY     greater than 12 years but not sure where colonoscopy was performed  . INCISION AND DRAINAGE ABSCESS Right 11/24/2014   Procedure: INCISION AND DRAINAGE RIGHT AXILLA  ABSCESS;  Surgeon: Fanny Skates, MD;  Location: [place];  Service: General;  Laterality: Right;  . RADIOACTIVE SEED GUIDED MASTECTOMY WITH AXILLARY SENTINEL LYMPH NODE BIOPSY Right 10/12/2014   Procedure: RIGHT  PARTIAL MASTECTOMY WITH RADIOACTIVE SEED LOCALIZATION  RIGHT  AXILLARY SENTINEL  NODE BIOPSY;  Surgeon: Fanny Skates, MD;  Location: [place];  Service: General;  Laterality:  Right;  . TUBAL LIGATION  1996   re annastomosis  . WISDOM TOOTH EXTRACTION      There were no vitals filed for this visit.      Subjective Assessment - 08/31/16 0935    Subjective Nothing new.  "The first day of therapy was terrible--pain increased; the second time was fine--I think I was more relaxed."   Currently in Pain? Yes   Pain Score 5    Pain Location Axilla   Pain Orientation Right;Left   Aggravating Factors  doing the Theraband exercises   Pain Relieving Factors not bad enough to take something                         [facility] Adult PT Treatment/Exercise - 08/31/16 0001      Shoulder Exercises: Supine   Other Supine Exercises Reviewed all of supine scapular strengthening series with yellow Theraband, 5 reps each.  Pt. needed cueing for correct technique.     Manual Therapy   Manual Therapy Scapular mobilization   Myofascial Release Rt. UE myofascial pulling; crosshands technique in diagonal, horizontal, and vertical at right chest and abdomen; in left sidelying, right shoulder in abduction with concurrent stretch distally at right flank   Scapular Mobilization to right in left sidelying   Passive ROM In Supine to Rt shoulder into flexion, abduction and er all  to pts tolerance; horizontal abduction stretch     Prosthetics   Current prosthetic wear tolerance (days/week)                   PT Education - 08/31/16 1803    Education provided Yes   Education Details reviewed supine scapular series with yellow Theraband   Person(s) Educated Patient   Methods Explanation;Verbal cues;Tactile cues   Comprehension Verbalized understanding;Returned demonstration                [facility] Goals - 08/31/16 [id number]      CC Long Term Goal  #1   Title Pt. will report at least 50% decrease in right shoulder/flank area of pain.   Baseline no improvement as of 08/31/16   Status On-going     CC Long Term Goal  #2   Title Patient will show  active right shoulder flexion to at least 155 degrees   Status On-going     CC Long Term Goal  #3   Title right shoulder active abduction to at least 170 degrees   Status On-going     CC Long Term Goal  #4   Title independent with HEP for shoulder ROM and strengthening   Status On-going            Plan - 08/31/16 1803    Clinical Impression Statement Patient reports her pain has not improved so far.  She had some trouble relaxing today, but could do this intermittently.  She continues to have pain with stretching.   Rehab Potential Excellent   Clinical Impairments Affecting Rehab Potential none   PT Frequency 2x / week   PT Duration 4 weeks   PT Treatment/Interventions ADLs/Self Care Home Management;Electrical Stimulation;Therapeutic exercise;Patient/family education;Manual techniques;Manual lymph drainage;Scar mobilization;Passive range of motion   PT Next Visit Plan Cont P/AA/A/ROM to right shoulder; include myofascial work to right upper quadrant.   Consulted and Agree with Plan of Care Patient      Patient will benefit from skilled therapeutic intervention in order to improve the following deficits and impairments:  Decreased range of motion, Decreased strength, Pain  Visit Diagnosis: Stiffness of right shoulder, not elsewhere classified  Chronic right shoulder pain     Problem List Patient Active Problem List   Diagnosis Date Noted  . Wrist pain, chronic, right 08/01/2016  . Genetic testing 04/27/2015  . Family history of colon cancer   . Family history of ovarian cancer   . Diabetes mellitus type II, non insulin dependent ([place]) 03/31/2015  . Breast cancer of upper-inner quadrant of right female breast ([place]) 08/26/2014  . Osteoporosis 08/10/2014  . Vaginal atrophy 04/27/2014  . GERD 04/07/2008  . FATTY LIVER DISEASE 04/07/2008    SALISBURY,DONNA 08/31/2016, 6:05 PM  [place], [place], [address] Phone: [phone]   Fax:  [phone]  Name: Jared Sasek MRN: [id number] Date of Birth: 1951/07/18   Serafina Royals, PT 08/31/16 6:06 PM

## 2016-09-03 ENCOUNTER — Ambulatory Visit: Payer: BLUE CROSS/BLUE SHIELD | Admitting: Physical Therapy

## 2016-09-03 DIAGNOSIS — M25511 Pain in right shoulder: Secondary | ICD-10-CM

## 2016-09-03 DIAGNOSIS — M25611 Stiffness of right shoulder, not elsewhere classified: Secondary | ICD-10-CM

## 2016-09-03 DIAGNOSIS — G8929 Other chronic pain: Secondary | ICD-10-CM

## 2016-09-03 NOTE — Therapy (Signed)
[place], [place], [address] Phone: [phone]   Fax:  [phone]  Physical Therapy Treatment  Patient Details  Name: Destiny Moody MRN: [id number] Date of Birth: 13-Apr-1951 Referring Provider: Dr. Lurline Del  Encounter Date: 09/03/2016      PT End of Session - 09/03/16 1653    Visit Number 5   Number of Visits 9   Date for PT Re-Evaluation 09/28/16   PT Start Time 1604   PT Stop Time 1650   PT Time Calculation (min) 46 min   Activity Tolerance Patient tolerated treatment well   Behavior During Therapy [facility] for tasks assessed/performed      Past Medical History:  Diagnosis Date  . Allergy   . Breast cancer ([place]) 08/24/14   right, upper inner  . Cancer ([place])   . Depression    no meds  . Family history of colon cancer   . Family history of ovarian cancer   . HSV infection   . Hypercholesterolemia   . Hypertension   . Hypothyroidism   . Multinodular thyroid   . NIDDM (non-insulin dependent diabetes mellitus)   . Osteopenia   . Osteoporosis 2015  . Radiation 01/31/15-03/01/15   right  breast  . Rheumatoid arthritis(714.0)    lower back  . Wears contact lenses     Past Surgical History:  Procedure Laterality Date  . CESAREAN SECTION  [id number]   x 2  . CHOLECYSTECTOMY  1993  . COLONOSCOPY     greater than 12 years but not sure where colonoscopy was performed  . INCISION AND DRAINAGE ABSCESS Right 11/24/2014   Procedure: INCISION AND DRAINAGE RIGHT AXILLA  ABSCESS;  Surgeon: Fanny Skates, MD;  Location: [place];  Service: General;  Laterality: Right;  . RADIOACTIVE SEED GUIDED MASTECTOMY WITH AXILLARY SENTINEL LYMPH NODE BIOPSY Right 10/12/2014   Procedure: RIGHT  PARTIAL MASTECTOMY WITH RADIOACTIVE SEED LOCALIZATION  RIGHT  AXILLARY SENTINEL  NODE BIOPSY;  Surgeon: Fanny Skates, MD;  Location: [employer];  Service: General;  Laterality: Right;  . TUBAL LIGATION   1996   re annastomosis  . WISDOM TOOTH EXTRACTION      There were no vitals filed for this visit.      Subjective Assessment - 09/03/16 1606    Subjective Was busy over the weekend and had pain then, but it's feeling okay today.  "Yesterday I fainted." This happened when she stood up. Says she thinks she's okay doing Theraband exercise.   Currently in Pain? No/denies  but had pain over the weekend--she waid she was busy and that's why            [facility] PT Assessment - 09/03/16 0001      AROM   Right Shoulder Flexion 152 Degrees   Right Shoulder ABduction 165 Degrees                     [facility] Adult PT Treatment/Exercise - 09/03/16 0001      Shoulder Exercises: Supine   Horizontal ABduction AROM;10 reps;Both  supine over towel roll, right side toward edge of mat     Shoulder Exercises: Pulleys   Flexion 2 minutes   ABduction 2 minutes     Shoulder Exercises: Therapy Ball   Flexion 10 reps  With forward lean into top of stretch   Flexion Limitations cueing for abdominal engagement to avoid increased lordosis     Shoulder Exercises: ROM/Strengthening  Other ROM/Strengthening Exercises Finger Ladder for Rt UE abduction 5 times  to #25     Shoulder Exercises: Stretch   Corner Stretch 3 reps;10 seconds     Manual Therapy   Myofascial Release Rt. UE myofascial pulling; crosshands technique in horizontal across chest.   Scapular Mobilization to right in left sidelying   Passive ROM In Supine to Rt shoulder into flexion, abduction and er all to pts tolerance.  Supine over towel roll, manual pect minor stretches.                        [facility] Goals - 08/31/16 [id number]      CC Long Term Goal  #1   Title Pt. will report at least 50% decrease in right shoulder/flank area of pain.   Baseline no improvement as of 08/31/16   Status On-going     CC Long Term Goal  #2   Title Patient will show active right shoulder flexion to at least 155  degrees   Status On-going     CC Long Term Goal  #3   Title right shoulder active abduction to at least 170 degrees   Status On-going     CC Long Term Goal  #4   Title independent with HEP for shoulder ROM and strengthening   Status On-going            Plan - 09/03/16 1654    Clinical Impression Statement Patient with a five degree increase in right shoulder active abduction today.  Gradual progress, but moving in the right direction.   Rehab Potential Excellent   Clinical Impairments Affecting Rehab Potential none   PT Frequency 2x / week   PT Duration 4 weeks   PT Treatment/Interventions ADLs/Self Care Home Management;Electrical Stimulation;Therapeutic exercise;Patient/family education;Manual techniques;Manual lymph drainage;Scar mobilization;Passive range of motion   PT Next Visit Plan Cont P/AA/A/ROM to right shoulder; include myofascial work to right upper quadrant.   PT Home Exercise Plan supine scapular with yellow Theraband   Consulted and Agree with Plan of Care Patient      Patient will benefit from skilled therapeutic intervention in order to improve the following deficits and impairments:  Decreased range of motion, Decreased strength, Pain  Visit Diagnosis: Stiffness of right shoulder, not elsewhere classified  Chronic right shoulder pain     Problem List Patient Active Problem List   Diagnosis Date Noted  . Wrist pain, chronic, right 08/01/2016  . Genetic testing 04/27/2015  . Family history of colon cancer   . Family history of ovarian cancer   . Diabetes mellitus type II, non insulin dependent ([place]) 03/31/2015  . Breast cancer of upper-inner quadrant of right female breast ([place]) 08/26/2014  . Osteoporosis 08/10/2014  . Vaginal atrophy 04/27/2014  . GERD 04/07/2008  . FATTY LIVER DISEASE 04/07/2008    Rinnah Peppel 09/03/2016, 4:56 PM  [place], [place],  [address] Phone: [phone]   Fax:  [phone]  Name: Destiny Moody MRN: [id number] Date of Birth: 01-27-1951  Serafina Royals, PT 09/03/16 4:56 PM

## 2016-09-04 ENCOUNTER — Other Ambulatory Visit: Payer: Self-pay | Admitting: Family Medicine

## 2016-09-05 ENCOUNTER — Ambulatory Visit: Payer: BLUE CROSS/BLUE SHIELD | Admitting: Physical Therapy

## 2016-09-05 DIAGNOSIS — G8929 Other chronic pain: Secondary | ICD-10-CM

## 2016-09-05 DIAGNOSIS — M25611 Stiffness of right shoulder, not elsewhere classified: Secondary | ICD-10-CM

## 2016-09-05 DIAGNOSIS — M25511 Pain in right shoulder: Secondary | ICD-10-CM

## 2016-09-05 NOTE — Therapy (Signed)
[place], [place], [address] Phone: [phone]   Fax:  [phone]  Physical Therapy Treatment  Patient Details  Name: Destiny Moody MRN: [id number] Date of Birth: Feb 16, 1951 Referring Provider: Dr. Lurline Del  Encounter Date: 09/05/2016      PT End of Session - 09/05/16 1607    Visit Number 6   Number of Visits 9   Date for PT Re-Evaluation 09/28/16   PT Start Time 8016   PT Stop Time 1603   PT Time Calculation (min) 42 min   Activity Tolerance Patient tolerated treatment well   Behavior During Therapy [facility] for tasks assessed/performed      Past Medical History:  Diagnosis Date  . Allergy   . Breast cancer ([place]) 08/24/14   right, upper inner  . Cancer ([place])   . Depression    no meds  . Family history of colon cancer   . Family history of ovarian cancer   . HSV infection   . Hypercholesterolemia   . Hypertension   . Hypothyroidism   . Multinodular thyroid   . NIDDM (non-insulin dependent diabetes mellitus)   . Osteopenia   . Osteoporosis 2015  . Radiation 01/31/15-03/01/15   right  breast  . Rheumatoid arthritis(714.0)    lower back  . Wears contact lenses     Past Surgical History:  Procedure Laterality Date  . CESAREAN SECTION  5537,4827   x 2  . CHOLECYSTECTOMY  1993  . COLONOSCOPY     greater than 12 years but not sure where colonoscopy was performed  . INCISION AND DRAINAGE ABSCESS Right 11/24/2014   Procedure: INCISION AND DRAINAGE RIGHT AXILLA  ABSCESS;  Surgeon: Fanny Skates, MD;  Location: [place];  Service: General;  Laterality: Right;  . RADIOACTIVE SEED GUIDED MASTECTOMY WITH AXILLARY SENTINEL LYMPH NODE BIOPSY Right 10/12/2014   Procedure: RIGHT  PARTIAL MASTECTOMY WITH RADIOACTIVE SEED LOCALIZATION  RIGHT  AXILLARY SENTINEL  NODE BIOPSY;  Surgeon: Fanny Skates, MD;  Location: [place];  Service: General;  Laterality: Right;  . TUBAL LIGATION   1996   re annastomosis  . WISDOM TOOTH EXTRACTION      There were no vitals filed for this visit.      Subjective Assessment - 09/05/16 1522    Subjective "I'm running and running today."   Currently in Pain? No/denies            [facility] PT Assessment - 09/05/16 0001      AROM   Right Shoulder Flexion 150 Degrees   Right Shoulder ABduction 175 Degrees  reports "warming up" by cleaning the house before session                     [facility] Adult PT Treatment/Exercise - 09/05/16 0001      Shoulder Exercises: Supine   Other Supine Exercises (When questioned, patient reports that the yellow Theraband is still challenging her at home, and that she is doing it "sometimes.")     Shoulder Exercises: Pulleys   Flexion 2 minutes   ABduction 2 minutes     Shoulder Exercises: Therapy Ball   Flexion 10 reps  With forward lean into top of stretch     Shoulder Exercises: ROM/Strengthening   Other ROM/Strengthening Exercises Finger Ladder for Rt UE abduction 5 times  to #25     Shoulder Exercises: Stretch   Corner Stretch 3 reps;10 seconds   Corner Stretch Limitations tends  to increase lumbar lordosis   Other Shoulder Stretches backward shoulder rolls in sitting at end of session x 7     Manual Therapy   Myofascial Release Rt. UE myofascial pulling with movement into abduction; right shoulder stretch into flexion with concurrent stretch in opposite direction at right abdomen; crosshands technique in diagonal across right chest; in left sidelying, right shoulder abduction with concurrent stretch at lower right flank in opposite direction.   Scapular Mobilization to right in left sidelying   Passive ROM In Supine to Rt shoulder into flexion, abduction and er all to pts tolerance; bilat. pect minor stretches in supine                        [place] - 09/05/16 1523      CC Long Term Goal  #1   Title Pt. will report at least 50% decrease in  right shoulder/flank area of pain.   Baseline 80% improved as of 09/05/16.   Status Achieved     CC Long Term Goal  #2   Title Patient will show active right shoulder flexion to at least 155 degrees   Status On-going     CC Long Term Goal  #3   Title right shoulder active abduction to at least 170 degrees   Baseline 159 at eval compared to 180 on left; 175 with extra push on 09/05/16   Status Achieved     CC Long Term Goal  #4   Title independent with HEP for shoulder ROM and strengthening   Status On-going            Plan - 09/05/16 1609    Clinical Impression Statement Patient reports significant decrease in pain in right shoulder area now, and she has increased another 10 degrees in abduction, though flexion remains the same.  She has met a couple of goals today.   Rehab Potential Excellent   Clinical Impairments Affecting Rehab Potential none   PT Frequency 2x / week   PT Duration 4 weeks   PT Treatment/Interventions ADLs/Self Care Home Management;Electrical Stimulation;Therapeutic exercise;Patient/family education;Manual techniques;Manual lymph drainage;Scar mobilization;Passive range of motion   PT Next Visit Plan Add more strengthening exercise for right shoulder area.  Cont P/AA/A/ROM to right shoulder; include myofascial work to right upper quadrant.   PT Home Exercise Plan supine scapular with yellow Theraband   Consulted and Agree with Plan of Care Patient      Patient will benefit from skilled therapeutic intervention in order to improve the following deficits and impairments:  Decreased range of motion, Decreased strength, Pain  Visit Diagnosis: Stiffness of right shoulder, not elsewhere classified  Chronic right shoulder pain     Problem List Patient Active Problem List   Diagnosis Date Noted  . Wrist pain, chronic, right 08/01/2016  . Genetic testing 04/27/2015  . Family history of colon cancer   . Family history of ovarian cancer   . Diabetes  mellitus type II, non insulin dependent ([place]) 03/31/2015  . Breast cancer of upper-inner quadrant of right female breast ([place]) 08/26/2014  . Osteoporosis 08/10/2014  . Vaginal atrophy 04/27/2014  . GERD 04/07/2008  . FATTY LIVER DISEASE 04/07/2008    SALISBURY,DONNA 09/05/2016, 4:12 PM  [place], [place], [address] Phone: [phone]   Fax:  [phone]  Name: Destiny Moody MRN: [id number] Date of Birth: August 22, 1951  Serafina Royals, PT 09/05/16 4:13 PM

## 2016-09-06 NOTE — Telephone Encounter (Signed)
06/2016 last ov 

## 2016-09-12 ENCOUNTER — Ambulatory Visit: Payer: BLUE CROSS/BLUE SHIELD | Admitting: Physical Therapy

## 2016-09-12 DIAGNOSIS — M25611 Stiffness of right shoulder, not elsewhere classified: Secondary | ICD-10-CM | POA: Diagnosis not present

## 2016-09-12 DIAGNOSIS — G8929 Other chronic pain: Secondary | ICD-10-CM

## 2016-09-12 DIAGNOSIS — M25511 Pain in right shoulder: Secondary | ICD-10-CM

## 2016-09-12 NOTE — Therapy (Signed)
[place], [place], [address] Phone: [phone]   Fax:  [phone]  Physical Therapy Treatment  Patient Details  Name: Destiny Moody MRN: [id number] Date of Birth: [birth year] Referring Provider: Dr. Lurline Del  Encounter Date: 09/12/2016      PT End of Session - 09/12/16 1211    Visit Number 7   Number of Visits 9   Date for PT Re-Evaluation 09/28/16   PT Start Time 1025   PT Stop Time 1106   PT Time Calculation (min) 41 min   Activity Tolerance Patient tolerated treatment well   Behavior During Therapy [facility] for tasks assessed/performed      Past Medical History:  Diagnosis Date  . Allergy   . Breast cancer ([place]) 08/24/14   right, upper inner  . Cancer ([place])   . Depression    no meds  . Family history of colon cancer   . Family history of ovarian cancer   . HSV infection   . Hypercholesterolemia   . Hypertension   . Hypothyroidism   . Multinodular thyroid   . NIDDM (non-insulin dependent diabetes mellitus)   . Osteopenia   . Osteoporosis 2015  . Radiation 01/31/15-03/01/15   right  breast  . Rheumatoid arthritis(714.0)    lower back  . Wears contact lenses     Past Surgical History:  Procedure Laterality Date  . CESAREAN SECTION  [id number]   x 2  . CHOLECYSTECTOMY  1993  . COLONOSCOPY     greater than 12 years but not sure where colonoscopy was performed  . INCISION AND DRAINAGE ABSCESS Right 11/24/2014   Procedure: INCISION AND DRAINAGE RIGHT AXILLA  ABSCESS;  Surgeon: Fanny Skates, MD;  Location: [place];  Service: General;  Laterality: Right;  . RADIOACTIVE SEED GUIDED MASTECTOMY WITH AXILLARY SENTINEL LYMPH NODE BIOPSY Right 10/12/2014   Procedure: RIGHT  PARTIAL MASTECTOMY WITH RADIOACTIVE SEED LOCALIZATION  RIGHT  AXILLARY SENTINEL  NODE BIOPSY;  Surgeon: Fanny Skates, MD;  Location: [place];  Service: General;  Laterality: Right;  . TUBAL LIGATION   1996   re annastomosis  . WISDOM TOOTH EXTRACTION      There were no vitals filed for this visit.      Subjective Assessment - 09/12/16 1028    Subjective Has been running, running, running nonstop.  Comes in late and stressed.   Currently in Pain? Yes   Pain Score 8    Pain Location Axilla  and lower breast   Pain Orientation Right   Pain Descriptors / Indicators Sore   Aggravating Factors  busy schedule   Pain Relieving Factors rest                         [facility] Adult PT Treatment/Exercise - 09/12/16 0001      Shoulder Exercises: Supine   Other Supine Exercises supine over towel roll, isometric bilat. shoulder extension with scapular retraction 5 counts x 5     Shoulder Exercises: Seated   Flexion AROM;Both  2x   Abduction AROM;Both;5 reps   Other Seated Exercises backward shoulder rolls at end of session     Manual Therapy   Myofascial Release Rt. UE myofascial pulling with movement into abduction; right shoulder stretch into flexion with concurrent stretch in opposite direction at right abdomen and right flank; crosshands technique in horizontal across right chest;   Passive ROM In Supine to Rt shoulder into flexion,  abduction and er all to pts tolerance; horizontal abduction stretch; pect minor stretch in supine over towel roll                        [facility] Goals - 09/05/16 1523      CC Long Term Goal  #1   Title Pt. will report at least 50% decrease in right shoulder/flank area of pain.   Baseline 80% improved as of 09/05/16.   Status Achieved     CC Long Term Goal  #2   Title Patient will show active right shoulder flexion to at least 155 degrees   Status On-going     CC Long Term Goal  #3   Title right shoulder active abduction to at least 170 degrees   Baseline 159 at eval compared to 180 on left; 175 with extra push on 09/05/16   Status Achieved     CC Long Term Goal  #4   Title independent with HEP for  shoulder ROM and strengthening   Status On-going            Plan - 09/12/16 1211    Clinical Impression Statement Patient's shoulder AROM was not measured at end of session, but appeared improved.     Rehab Potential Excellent   Clinical Impairments Affecting Rehab Potential none   PT Frequency 2x / week   PT Duration 4 weeks   PT Treatment/Interventions ADLs/Self Care Home Management;Electrical Stimulation;Therapeutic exercise;Patient/family education;Manual techniques;Manual lymph drainage;Scar mobilization;Passive range of motion   PT Next Visit Plan Remeasure shoulder A/ROM. Add more strengthening exercise for right shoulder area.  Cont P/AA/A/ROM to right shoulder; include myofascial work to right upper quadrant.   Consulted and Agree with Plan of Care Patient      Patient will benefit from skilled therapeutic intervention in order to improve the following deficits and impairments:  Decreased range of motion, Decreased strength, Pain  Visit Diagnosis: Stiffness of right shoulder, not elsewhere classified  Chronic right shoulder pain     Problem List Patient Active Problem List   Diagnosis Date Noted  . Wrist pain, chronic, right 08/01/2016  . Genetic testing 04/27/2015  . Family history of colon cancer   . Family history of ovarian cancer   . Diabetes mellitus type II, non insulin dependent ([place]) 03/31/2015  . Breast cancer of upper-inner quadrant of right female breast ([place]) 08/26/2014  . Osteoporosis 08/10/2014  . Vaginal atrophy 04/27/2014  . GERD 04/07/2008  . FATTY LIVER DISEASE 04/07/2008    Destiny Moody 09/12/2016, 12:13 PM  [place], [place], [address] Phone: [phone]   Fax:  [phone]  Name: Destiny Moody MRN: [id number] Date of Birth: 1951-05-24  Serafina Royals, PT 09/12/16 12:13 PM

## 2016-09-17 DIAGNOSIS — A609 Anogenital herpesviral infection, unspecified: Secondary | ICD-10-CM

## 2016-09-17 HISTORY — DX: Anogenital herpesviral infection, unspecified: A60.9

## 2016-09-19 ENCOUNTER — Ambulatory Visit: Payer: BLUE CROSS/BLUE SHIELD | Attending: Oncology

## 2016-09-19 DIAGNOSIS — M25611 Stiffness of right shoulder, not elsewhere classified: Secondary | ICD-10-CM | POA: Diagnosis present

## 2016-09-19 DIAGNOSIS — M25511 Pain in right shoulder: Secondary | ICD-10-CM | POA: Diagnosis present

## 2016-09-19 DIAGNOSIS — G8929 Other chronic pain: Secondary | ICD-10-CM | POA: Insufficient documentation

## 2016-09-19 NOTE — Therapy (Signed)
[facility] [address] [place], [place], [address] Phone: [phone]   Fax:  [phone]  Physical Therapy Treatment  Patient Details  Name: Destiny Moody MRN: [id number] Date of Birth: 15-Apr-1951 Referring Provider: Dr. Ruthann Cancer  Encounter Date: 09/19/2016      PT End of Session - 09/19/16 1429    Visit Number 8   Number of Visits 9   Date for PT Re-Evaluation 09/28/16   PT Start Time 1347   PT Stop Time 1431   PT Time Calculation (min) 44 min   Activity Tolerance Patient tolerated treatment well   Behavior During Therapy [facility] for tasks assessed/performed      Past Medical History:  Diagnosis Date  . Allergy   . Breast cancer ([facility]) 08/24/14   right, upper inner  . Cancer ([facility])   . Depression    no meds  . Family history of colon cancer   . Family history of ovarian cancer   . HSV infection   . Hypercholesterolemia   . Hypertension   . Hypothyroidism   . Multinodular thyroid   . NIDDM (non-insulin dependent diabetes mellitus)   . Osteopenia   . Osteoporosis 2015  . Radiation 01/31/15-03/01/15   right  breast  . Rheumatoid arthritis(714.0)    lower back  . Wears contact lenses     Past Surgical History:  Procedure Laterality Date  . CESAREAN SECTION  [id number]   x 2  . CHOLECYSTECTOMY  1993  . COLONOSCOPY     greater than 12 years but not sure where colonoscopy was performed  . INCISION AND DRAINAGE ABSCESS Right 11/24/2014   Procedure: INCISION AND DRAINAGE RIGHT AXILLA  ABSCESS;  Surgeon: Claud Kelp, MD;  [facility];  Service: General;  Laterality: Right;  . RADIOACTIVE SEED GUIDED MASTECTOMY WITH AXILLARY SENTINEL LYMPH NODE BIOPSY Right 10/12/2014   Procedure: RIGHT  PARTIAL MASTECTOMY WITH RADIOACTIVE SEED LOCALIZATION  RIGHT  AXILLARY SENTINEL  NODE BIOPSY;  Surgeon: Claud Kelp, MD;  Location: [facility];  Service: General;  Laterality: Right;  . TUBAL LIGATION  1996    re annastomosis  . WISDOM TOOTH EXTRACTION      There were no vitals filed for this visit.      Subjective Assessment - 09/19/16 1350    Subjective I have an area at my Rt lateral ribs that used to hurt but hasn't in some time, however it has been hurting since last visit. 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    Pertinent History status post right lumpectomy and sentinel lymph node sampling 10/12/2014 for a pT1c pN0, stage IA invasive ductal carcinoma, grade 1, with ample margins.  Had to go back in due to an infection in 11/23/14.  Patient reports she had to insist on care for this infection prior to that second surgery, in which the infection was cleaned.  Had XRT that was finished 05/2015.  On tamoxifen.  Diabetes.  Patient is unsure why she is on lisinopril.     Currently in Pain? No/denies   Pain Score 5    Pain Location Rib cage   Pain Orientation Right   Pain Descriptors / Indicators Sore  Kind of feels full   Pain Onset In the past 7 days   Aggravating Factors  this pain started with radiation, hasn't hurt for awhile but started bothering  me after last weeks session; reaching hurts more   Pain Relieving Factors pain meds help some            [facility] PT Assessment - 09/19/16 0001      AROM   Right Shoulder Flexion 158 Degrees   Right Shoulder ABduction 171 Degrees                     [facility] Adult PT Treatment/Exercise - 09/19/16 0001      Elbow Exercises   Elbow Flexion Strengthening;Both;20 reps;Seated;Bar weights/barbell  2 sets of 10, 3 lbs     Shoulder Exercises: Standing   Flexion Strengthening;Both;10 reps;Weights   Shoulder Flexion Weight (lbs) 2   Flexion Limitations Demonstration and VC for correct technique throughout   ABduction Strengthening;Both;10 reps;Weights   Shoulder ABduction Weight (lbs) 2   Other Standing Exercises Wall Push Ups 2 sets of  10 times     Shoulder Exercises: Pulleys   ABduction 2 minutes     Shoulder Exercises: Therapy Ball   Flexion 10 reps  With forward lean into top of stretch     Manual Therapy   Myofascial Release Rt. UE myofascial pulling with movement into abduction; right shoulder stretch into flexion with concurrent stretch in opposite direction at right abdomen and right flank; crosshands technique in horizontal across right chest;   Passive ROM In Supine to Rt shoulder into flexion, abduction and er all to pts tolerance; horizontal abduction stretch; pect minor stretch in supine over towel roll                        Long Term Clinic Goals - 09/19/16 1429      CC Long Term Goal  #1   Title Pt. will report at least 50% decrease in right shoulder/flank area of pain.   Baseline 80% improved as of 09/05/16.   Status Achieved     CC Long Term Goal  #2   Title Patient will show active right shoulder flexion to at least 155 degrees   Baseline 146 degrees at eval compared to 163 on left; 158 degrees 09/19/16   Status Achieved     CC Long Term Goal  #3   Title right shoulder active abduction to at least 170 degrees   Baseline 159 at eval compared to 180 on left; 175 with extra push on 09/05/16; 171 degrees 09/19/16   Status Achieved     CC Long Term Goal  #4   Title independent with HEP for shoulder ROM and strengthening   Status Partially Met            Plan - 09/19/16 1430    Clinical Impression Statement Pts Rt shoulder A/ROM has improved from last week meeting this goal. She is doing HEP "a few times a week" per her report but feels she knows how to do exercises.    Rehab Potential Excellent   Clinical Impairments Affecting Rehab Potential none   PT Frequency 2x / week   PT Duration 4 weeks   PT Treatment/Interventions ADLs/Self Care Home Management;Electrical Stimulation;Therapeutic exercise;Patient/family education;Manual techniques;Manual lymph drainage;Scar  mobilization;Passive range of motion   PT Next Visit Plan Possible D/C in next 1-2 visits as pt has progressed very well towards goals. Add more strengthening to HEP (like 3 way raises we did today) and cont P/A/AA/ROM to Rt shoulder and myofascial to same side quadrant   Consulted and Agree with Plan of Care Patient  Patient will benefit from skilled therapeutic intervention in order to improve the following deficits and impairments:  Decreased range of motion, Decreased strength, Pain  Visit Diagnosis: Stiffness of right shoulder, not elsewhere classified  Chronic right shoulder pain     Problem List Patient Active Problem List   Diagnosis Date Noted  . Wrist pain, chronic, right 08/01/2016  . Genetic testing 04/27/2015  . Family history of colon cancer   . Family history of ovarian cancer   . Diabetes mellitus type II, non insulin dependent ([place]) 03/31/2015  . Breast cancer of upper-inner quadrant of right female breast ([place]) 08/26/2014  . Osteoporosis 08/10/2014  . Vaginal atrophy 04/27/2014  . GERD 04/07/2008  . FATTY LIVER DISEASE 04/07/2008    Otelia Limes, PTA 09/19/2016, 2:33 PM  [place], [place], [address] Phone: [phone]   Fax:  [phone]  Name: Kimyetta Flott MRN: [id number] Date of Birth: 1951-07-29

## 2016-09-21 ENCOUNTER — Ambulatory Visit: Payer: BLUE CROSS/BLUE SHIELD | Admitting: Physical Therapy

## 2016-09-21 DIAGNOSIS — G8929 Other chronic pain: Secondary | ICD-10-CM

## 2016-09-21 DIAGNOSIS — M25611 Stiffness of right shoulder, not elsewhere classified: Secondary | ICD-10-CM | POA: Diagnosis not present

## 2016-09-21 DIAGNOSIS — M25511 Pain in right shoulder: Secondary | ICD-10-CM

## 2016-09-21 NOTE — Therapy (Signed)
[place], [place], [address] Phone: [phone]   Fax:  [phone]  Physical Therapy Treatment  Patient Details  Name: Destiny Moody MRN: [id number] Date of Birth: 1950-10-29 Referring Provider: Dr. Lurline Del  Encounter Date: 09/21/2016      PT End of Session - 09/21/16 0930    Visit Number 9   Number of Visits 9   Date for PT Re-Evaluation 09/28/16   PT Start Time 0845   PT Stop Time 0930   PT Time Calculation (min) 45 min   Activity Tolerance Patient tolerated treatment well   Behavior During Therapy [facility] for tasks assessed/performed      Past Medical History:  Diagnosis Date  . Allergy   . Breast cancer ([place]) 08/24/14   right, upper inner  . Cancer ([place])   . Depression    no meds  . Family history of colon cancer   . Family history of ovarian cancer   . HSV infection   . Hypercholesterolemia   . Hypertension   . Hypothyroidism   . Multinodular thyroid   . NIDDM (non-insulin dependent diabetes mellitus)   . Osteopenia   . Osteoporosis 2015  . Radiation 01/31/15-03/01/15   right  breast  . Rheumatoid arthritis(714.0)    lower back  . Wears contact lenses     Past Surgical History:  Procedure Laterality Date  . CESAREAN SECTION  [id number]   x 2  . CHOLECYSTECTOMY  1993  . COLONOSCOPY     greater than 12 years but not sure where colonoscopy was performed  . INCISION AND DRAINAGE ABSCESS Right 11/24/2014   Procedure: INCISION AND DRAINAGE RIGHT AXILLA  ABSCESS;  Surgeon: Fanny Skates, MD;  Location: [place];  Service: General;  Laterality: Right;  . RADIOACTIVE SEED GUIDED MASTECTOMY WITH AXILLARY SENTINEL LYMPH NODE BIOPSY Right 10/12/2014   Procedure: RIGHT  PARTIAL MASTECTOMY WITH RADIOACTIVE SEED LOCALIZATION  RIGHT  AXILLARY SENTINEL  NODE BIOPSY;  Surgeon: Fanny Skates, MD;  Location: [place];  Service: General;  Laterality: Right;  . TUBAL LIGATION  1996    re annastomosis  . WISDOM TOOTH EXTRACTION      There were no vitals filed for this visit.      Subjective Assessment - 09/21/16 0854    Subjective pt states she  still has pain in her ribs that is a little bit better but she has pain still.  She said she got better last year after doing LIve Stong at the [facility] but she admits she has not been doing the execises for her arms that she has learned here this session    Pertinent History status post right lumpectomy and sentinel lymph node sampling 10/12/2014 for a pT1c pN0, stage IA invasive ductal carcinoma, grade 1, with ample margins.  Had to go back in due to an infection in 11/23/14.  Patient reports she had to insist on care for this infection prior to that second surgery, in which the infection was cleaned.  Had XRT that was finished 05/2015.  On tamoxifen.  Diabetes.  Patient is unsure why she is on lisinopril.     Patient Stated Goals get out of her pain   Currently in Pain? Yes   Pain Score 6    Pain Location Rib cage   Pain Orientation Right   Pain Type Chronic pain   Aggravating Factors  when she lift something heavy  [place] Adult PT Treatment/Exercise - 09/21/16 0001      Self-Care   Self-Care Other Self-Care Comments   Other Self-Care Comments  instruciton about community exercise programs at [employer], [employer] [facility] and cancer center  educated pt to try sports bra for compression at right lateral chest, but she said she did not tolerate it before, she will try again      Shoulder Exercises: Seated   Elevation Strengthening;Both;5 reps;Theraband  narrow and wide grip    Theraband Level (Shoulder Elevation) Level 1 (Yellow)   Horizontal ABduction Strengthening;Both;5 reps;Theraband   Theraband Level (Shoulder Horizontal ABduction) Level 1 (Yellow)   External Rotation Strengthening;Both;5 reps;Theraband   Theraband Level (Shoulder External Rotation) Level 1 (Yellow)   Other  Seated Exercises diagonal elevation with yellow theraband with both arms.      Shoulder Exercises: Sidelying   ABduction AROM;Right   Other Sidelying Exercises small controlled circled 5 times in each direction    Other Sidelying Exercises Horizontal abduction with upper thoracic rotation      Manual Therapy   Myofascial Release to right axilla chest and upper arm    Passive ROM In Supine to Rt shoulder into flexion, abduction and er all to pts tolerance; horizontal abduction stretch; pect minor stretch in supine over towel roll                PT Education - 09/21/16 1241    Education provided Yes   Education Details continue exercise.  attend community exercise classes    Person(s) Educated Patient   Methods Explanation;Demonstration   Comprehension Verbalized understanding;Returned demonstration                [facility] Goals - 09/21/16 1244      CC Long Term Goal  #1   Title Pt. will report at least 50% decrease in right shoulder/flank area of pain.   Baseline 80% improved as of 09/05/16.   Status Achieved     CC Long Term Goal  #2   Title Patient will show active right shoulder flexion to at least 155 degrees   Baseline 146 degrees at eval compared to 163 on left; 158 degrees 09/19/16   Status Achieved     CC Long Term Goal  #3   Title right shoulder active abduction to at least 170 degrees   Baseline 159 at eval compared to 180 on left; 175 with extra push on 09/05/16; 171 degrees 09/19/16   Status Achieved     CC Long Term Goal  #4   Title independent with HEP for shoulder ROM and strengthening   Status Achieved            Plan - 09/21/16 1242    Clinical Impression Statement Pt continues to have pain and firmness at lateral chest and breast near incisions, but it is better since starting here.  Encouraged pt to try sports bra for compression to this area and to pursue yoga for stretching at the cancer center and other community exercise  options.  Do not feel continued therapy witll assist this pt so will not ask for renewal of this episode    Clinical Impairments Affecting Rehab Potential none   PT Next Visit Plan Discharge      Patient will benefit from skilled therapeutic intervention in order to improve the following deficits and impairments:  Decreased range of motion, Decreased strength, Pain  Visit Diagnosis: Stiffness of right shoulder, not elsewhere classified  Chronic right shoulder  pain     Problem List Patient Active Problem List   Diagnosis Date Noted  . Wrist pain, chronic, right 08/01/2016  . Genetic testing 04/27/2015  . Family history of colon cancer   . Family history of ovarian cancer   . Diabetes mellitus type II, non insulin dependent ([place]) 03/31/2015  . Breast cancer of upper-inner quadrant of right female breast ([place]) 08/26/2014  . Osteoporosis 08/10/2014  . Vaginal atrophy 04/27/2014  . GERD 04/07/2008  . FATTY LIVER DISEASE 04/07/2008   PHYSICAL THERAPY DISCHARGE SUMMARY  Visits from Start of Care: 9  Current functional level related to goals / functional outcomes: Pt knows exercise program and is independent functionally    Remaining deficits: Intermittent pain, but better    Education / Equipment: Home exercise  Plan: Patient agrees to discharge.  Patient goals were met. Patient is being discharged due to meeting the stated rehab goals.  ?????    Donato Heinz. Owens Shark PT  Norwood Levo 09/21/2016, 12:46 PM  [place], [place], [address] Phone: [phone]   Fax:  [phone]  Name: Destiny Moody MRN: [id number] Date of Birth: 03/13/1951

## 2016-09-24 ENCOUNTER — Encounter: Payer: BLUE CROSS/BLUE SHIELD | Admitting: Physical Therapy

## 2016-10-01 ENCOUNTER — Encounter: Payer: BLUE CROSS/BLUE SHIELD | Admitting: Physical Therapy

## 2016-10-10 ENCOUNTER — Other Ambulatory Visit: Payer: Self-pay | Admitting: Family Medicine

## 2016-10-10 ENCOUNTER — Encounter: Payer: BLUE CROSS/BLUE SHIELD | Admitting: Physical Therapy

## 2016-10-10 DIAGNOSIS — I10 Essential (primary) hypertension: Secondary | ICD-10-CM

## 2016-10-12 ENCOUNTER — Other Ambulatory Visit: Payer: Self-pay | Admitting: Family Medicine

## 2016-10-12 ENCOUNTER — Other Ambulatory Visit: Payer: Self-pay | Admitting: Emergency Medicine

## 2016-10-12 DIAGNOSIS — I10 Essential (primary) hypertension: Secondary | ICD-10-CM

## 2016-10-12 MED ORDER — LISINOPRIL 2.5 MG PO TABS: 2.5000 mg | ORAL_TABLET | Freq: Every day | ORAL | 0 refills | Status: DC

## 2016-10-17 ENCOUNTER — Encounter: Payer: BLUE CROSS/BLUE SHIELD | Admitting: Physical Therapy

## 2016-10-24 ENCOUNTER — Other Ambulatory Visit: Payer: Self-pay | Admitting: Family Medicine

## 2016-10-28 ENCOUNTER — Other Ambulatory Visit: Payer: Self-pay | Admitting: Family Medicine

## 2016-10-28 DIAGNOSIS — E119 Type 2 diabetes mellitus without complications: Secondary | ICD-10-CM

## 2016-11-06 ENCOUNTER — Other Ambulatory Visit: Payer: Self-pay | Admitting: Family Medicine

## 2016-11-06 DIAGNOSIS — E119 Type 2 diabetes mellitus without complications: Secondary | ICD-10-CM

## 2016-11-06 DIAGNOSIS — I10 Essential (primary) hypertension: Secondary | ICD-10-CM

## 2016-11-09 ENCOUNTER — Other Ambulatory Visit: Payer: Self-pay | Admitting: Family Medicine

## 2016-11-09 DIAGNOSIS — E119 Type 2 diabetes mellitus without complications: Secondary | ICD-10-CM

## 2016-11-13 ENCOUNTER — Other Ambulatory Visit: Payer: Self-pay | Admitting: Emergency Medicine

## 2016-11-21 ENCOUNTER — Other Ambulatory Visit: Payer: Self-pay | Admitting: Family Medicine

## 2016-11-21 DIAGNOSIS — I10 Essential (primary) hypertension: Secondary | ICD-10-CM

## 2016-11-22 ENCOUNTER — Other Ambulatory Visit: Payer: Self-pay | Admitting: Family Medicine

## 2016-11-22 DIAGNOSIS — I10 Essential (primary) hypertension: Secondary | ICD-10-CM

## 2016-12-10 ENCOUNTER — Encounter: Payer: Self-pay | Admitting: Gynecology

## 2016-12-10 ENCOUNTER — Ambulatory Visit (INDEPENDENT_AMBULATORY_CARE_PROVIDER_SITE_OTHER): Payer: BLUE CROSS/BLUE SHIELD | Admitting: Gynecology

## 2016-12-10 VITALS — BP 122/78 | Ht 62.0 in | Wt 150.0 lb

## 2016-12-10 DIAGNOSIS — Z01411 Encounter for gynecological examination (general) (routine) with abnormal findings: Secondary | ICD-10-CM | POA: Diagnosis not present

## 2016-12-10 DIAGNOSIS — M810 Age-related osteoporosis without current pathological fracture: Secondary | ICD-10-CM | POA: Diagnosis not present

## 2016-12-10 NOTE — Patient Instructions (Signed)
Densitometra sea (Bone Densitometry) La densitometra sea es un estudio de diagnstico por imgenes en el que se utiliza una radiografa especial que mide la cantidad de calcio y otros minerales en los huesos (densidad sea). Este estudio tambin se conoce como examen de densidad mineral sea o radioabsorciometra de doble energa (DEXA). Puede medir la densidad sea en la cadera y la columna. Es similar a una radiografa comn. Tambin pueden hacerle este estudio para:  Diagnosticar una enfermedad que causa huesos dbiles o delgados (osteoporosis).  Predecir el riesgo de un hueso roto (fractura).  Determinar si el tratamiento para la osteoporosis funciona. INFORME A SU MDICO:  Cualquier alergia que tenga.  Todos los medicamentos que utiliza, incluidos vitaminas, hierbas, gotas oftlmicas, cremas y medicamentos de venta libre.  Problemas previos que usted o los miembros de su familia hayan tenido con el uso de anestsicos.  Enfermedades de la sangre que tenga.  Si tiene cirugas previas.  Enfermedades que tenga.  Probabilidad de embarazo.  Cualquier otro estudio mdico al que se haya sometido en los ltimos 14 das en el que se haya utilizado material de contraste. RIESGOS Y COMPLICACIONES En general, se trata de un procedimiento seguro. Sin embargo, pueden ocurrir algunos problemas, entre los que se pueden incluir los siguientes:  Este estudio lo expone a una cantidad muy pequea de radiacin.  Los riesgos de la exposicin a la radiacin pueden ser mayores para los nios por nacer. ANTES DEL PROCEDIMIENTO  No tome ningn suplemento de calcio durante 24 horas antes de realizarse el estudio. Puede comer y beber como lo hace habitualmente.  Qutese todas las joyas de metal, anteojos, aparatos dentales y cualquier otro objeto metlico. PROCEDIMIENTO  Deber recostarse en una camilla. Un generador de rayos X estar ubicado debajo de usted y un dispositivo de imgenes, por  encima.  Se pueden usar otros dispositivos, como cajas o abrazaderas, para posicionar el cuerpo apropiadamente para la exploracin.  Deber permanecer inmvil mientras la mquina explore lentamente su cuerpo.  Las imgenes se muestran en el monitor de una computadora. DESPUS DEL PROCEDIMIENTO Es posible que necesite estudios adicionales ms adelante. Esta informacin no tiene como fin reemplazar el consejo del mdico. Asegrese de hacerle al mdico cualquier pregunta que tenga. Document Released: 05/28/2012 Document Revised: 09/24/2014 Document Reviewed: 02/11/2014 Elsevier Interactive Patient Education  2017 Elsevier Inc.  

## 2016-12-10 NOTE — Addendum Note (Signed)
Addended by: Nelva Nay on: 12/10/2016 10:34 AM   Modules accepted: Orders

## 2016-12-10 NOTE — Progress Notes (Signed)
Destiny Moody 17-Dec-1950 [id number]   History:    66 y.o.  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 History of a goiter  with normal TSH. Hypertension and diabetes managed by endocrinologist. 2016 T score -2.6 started on Fosamax and has tolerated since 2015. Patient had a normal colonoscopy in 2017. She's been followed by the medical oncologist Dr. Sherlynn Stalls every 6 months and her PCP has been doing her blood work Dr. Delfina Redwood and her vaccines are all up-to-date. Patient with no past history of any abnormal Pap smears. normal  Past medical history,surgical history, family history and social history were all reviewed and documented in the EPIC chart.  Gynecologic History No LMP recorded. Patient is postmenopausal. Contraception: post menopausal status Last Pap: 2015 . Results were: normal Last mammogram: 2017 . Results were: normal  Obstetric History OB History  Gravida Para Term Preterm AB Living  _0 SAB TAB Ectopic Multiple Live Births               # Outcome Date GA Lbr Len/2nd Weight Sex Delivery Anes PTL Lv  2 Para      CS-LTranv     1 Para      CS-LTranv          ROS: A ROS was performed and pertinent positives and negatives are included in the history.  GENERAL: No fevers or chills. HEENT: No change in vision, no earache, sore throat or sinus congestion. NECK: No pain or stiffness. CARDIOVASCULAR: No chest pain or pressure. No palpitations. PULMONARY: No shortness of breath, cough or wheeze. GASTROINTESTINAL: No abdominal pain, nausea, vomiting or diarrhea, melena or bright red blood per rectum. GENITOURINARY: No urinary frequency, urgency, hesitancy or dysuria. MUSCULOSKELETAL: No joint or muscle pain, no back pain, no recent trauma. DERMATOLOGIC: No rash, no itching, no lesions. ENDOCRINE: No polyuria, polydipsia, no heat or cold intolerance. No recent change in weight.  HEMATOLOGICAL: No anemia or easy bruising or bleeding. NEUROLOGIC: No headache, seizures, numbness, tingling or weakness. PSYCHIATRIC: No depression, no loss of interest in normal activity or change in sleep pattern.     Exam: chaperone present  BP 122/78   Ht _1  (1.575 m)   Wt 150 lb (68 kg)   BMI 27.44 kg/m   Body mass index is 27.44 kg/m.  General appearance : Well developed well nourished female. No acute distress HEENT: Eyes: no retinal hemorrhage or exudates,  Neck supple, trachea midline, no carotid bruits, no thyroidmegaly Lungs: Clear to auscultation, no rhonchi or wheezes, or rib retractions  Heart: Regular rate and rhythm, no murmurs or gallops Breast:Examined in sitting and supine position were symmetrical in appearance, no palpable masses or tenderness,  no skin retraction, no nipple inversion, no nipple discharge, no skin discoloration, no axillary or supraclavicular lymphadenopathy Abdomen: no palpable masses or tenderness, no rebound or guarding Extremities: no edema or skin discoloration or tenderness  Pelvic:  Bartholin, Urethra, Skene Glands: Within normal limits             Vagina: No gross lesions or discharge  Cervix: No gross lesions or discharge  Uterus  anteverted , normal size, shape and consistency, non-tender and mobile  Adnexa  Without masses or tenderness  Anus and perineum  normal   Rectovaginal  normal sphincter tone without palpated masses or tenderness  Hemoccult cards provided    Assessment/Plan: Patient with 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 Patient also with history of osteoporosis would like to switch from Fosamax 70 mg every weekly to Actonel 150 mg every monthly we will give her a prescription to see the insurance will cover it if so we will make to switch. Patient once again no state risk benefits and pros and cons of antiresorptive agent. This is second year of medication will  recommend drug holiday after 7 years. We encouraged her to take calcium and vitamin D daily along with weightbearing exercises 2 times a week. She was provided with fecal Hemoccult cards to submit to the office for testing. Pap smear was done today. PCP has been doing her blood work.

## 2016-12-12 LAB — PAP IG W/ RFLX HPV ASCU

## 2016-12-20 ENCOUNTER — Other Ambulatory Visit: Payer: Self-pay | Admitting: Gynecology

## 2016-12-20 ENCOUNTER — Ambulatory Visit (INDEPENDENT_AMBULATORY_CARE_PROVIDER_SITE_OTHER): Payer: BLUE CROSS/BLUE SHIELD | Admitting: Gynecology

## 2016-12-20 ENCOUNTER — Encounter: Payer: Self-pay | Admitting: Gynecology

## 2016-12-20 ENCOUNTER — Ambulatory Visit (INDEPENDENT_AMBULATORY_CARE_PROVIDER_SITE_OTHER): Payer: BLUE CROSS/BLUE SHIELD

## 2016-12-20 VITALS — BP 108/66 | Ht 62.0 in | Wt 150.0 lb

## 2016-12-20 DIAGNOSIS — M810 Age-related osteoporosis without current pathological fracture: Secondary | ICD-10-CM | POA: Diagnosis not present

## 2016-12-20 DIAGNOSIS — N760 Acute vaginitis: Secondary | ICD-10-CM | POA: Diagnosis not present

## 2016-12-20 DIAGNOSIS — N9089 Other specified noninflammatory disorders of vulva and perineum: Secondary | ICD-10-CM | POA: Diagnosis not present

## 2016-12-20 DIAGNOSIS — B9689 Other specified bacterial agents as the cause of diseases classified elsewhere: Secondary | ICD-10-CM

## 2016-12-20 DIAGNOSIS — A6009 Herpesviral infection of other urogenital tract: Secondary | ICD-10-CM | POA: Diagnosis not present

## 2016-12-20 LAB — WET PREP FOR TRICH, YEAST, CLUE
Clue Cells Wet Prep HPF POC: NONE SEEN
Trich, Wet Prep: NONE SEEN
Yeast Wet Prep HPF POC: NONE SEEN

## 2016-12-20 MED ORDER — CLINDAMYCIN PHOSPHATE 2 % VA CREA: 1.0000 | TOPICAL_CREAM | Freq: Every day | VAGINAL | 2 refills | Status: DC

## 2016-12-20 MED ORDER — CLINDAMYCIN PHOSPHATE 2 % VA CREA: 1.0000 | TOPICAL_CREAM | Freq: Every day | VAGINAL | 0 refills | Status: DC

## 2016-12-20 NOTE — Progress Notes (Signed)
  Patient is a 66 year old who was seen in the office for annual exam on March 2060 previous note. She is here today complaining of a vaginal discharge which she states has been going on for several weeks she did not mention that at time of her annual exam. She denies any dysuria, frequency, back pain she is in a monogamous relationship. No GI complaints. The discharge she describes clear slightly white with no odor and some mild pruritus. Patient is a diabetic.  Exam: External genitalia midportion of right labia majora acicular like area is highly suspicious for HSV. On questioning patient's she stated her husband gets periodic outbreaks. Speculum exam creamy discharge with odor was noted wet prep obtained Cervix: No gross lesions on inspection  Wet prep moderate bacteria few white blood cells  Assessment/plan patient which but appears to be bacterial vaginosis she is going to be placed on clindamycin vaginal cream to apply daily at bedtime for 1 week refills provided. There was no evidence of any yeast. Highly suspicious of right labia majora for HSV. HSV culture obtained.

## 2016-12-20 NOTE — Patient Instructions (Signed)
Vaginosis bacteriana (Bacterial Vaginosis) La vaginosis bacteriana es una infeccin vaginal que perturba el equilibrio normal de las bacterias que se encuentran en la vagina. Es el resultado de un crecimiento excesivo de ciertas bacterias. Esta es la infeccin vaginal ms frecuente en mujeres en edad reproductiva. El tratamiento es importante para prevenir complicaciones, especialmente en mujeres embarazadas, dado que puede causar un parto prematuro. CAUSAS La vaginosis bacteriana se origina por un aumento de bacterias nocivas que, generalmente, estn presentes en cantidades ms pequeas en la vagina. Varios tipos diferentes de bacterias pueden causar esta afeccin. Sin embargo, la causa de su desarrollo no se comprende totalmente. [place] o comportamientos pueden exponerlo a un mayor riesgo de desarrollar vaginosis bacteriana, entre los que se incluyen:  Tener una nueva pareja sexual o mltiples parejas sexuales.  Las duchas vaginales  El uso del DIU (dispositivo intrauterino) como mtodo anticonceptivo. El contagio no se produce en baos, por ropas de cama, en piscinas o por contacto con objetos. SIGNOS Y SNTOMAS Algunas mujeres que padecen vaginosis bacteriana no presentan signos ni sntomas. Los sntomas ms comunes son:  Secrecin vaginal de color grisceo.  Secrecin vaginal con olor similar al [employer], especialmente despus de Retail banker.  Picazn o sensacin de ardor en la vagina o la vulva.  Ardor o dolor al [employer]. DIAGNSTICO Su mdico analizar su historia clnica y le examinar la vagina para detectar signos de vaginosis bacteriana. Puede tomarle Truddie Coco de flujo vaginal. Su mdico examinar esta muestra con un microscopio para controlar las bacterias y clulas anormales. Tambin puede realizarse un anlisis del pH vaginal. TRATAMIENTO La vaginosis bacteriana puede tratarse con antibiticos, en forma de comprimidos o de  crema vaginal. Puede indicarse una segunda tanda de antibiticos si la afeccin se repite despus del tratamiento. Debido a que la vaginosis bacteriana aumenta el riesgo de contraer enfermedades de transmisin sexual, el tratamiento puede ayudar a reducir el riesgo de clamidia, [place], VIH y herpes. [place] solo medicamentos de venta libre o recetados, segn las indicaciones del mdico.  Si le han recetado antibiticos, tmelos como se le indic. Asegrese de que finaliza la prescripcin completa aunque se sienta mejor.  Comunique a sus compaeros sexuales que sufre una infeccin vaginal. Deben consultar a su mdico y recibir tratamiento si tienen problemas, como picazn o una erupcin cutnea leve.  Durante el [place], es importante que siga estas indicaciones:  Visual merchandiser relaciones sexuales o use preservativos de la forma correcta.  No se haga duchas vaginales.  Evite consumir alcohol como se lo haya indicado el mdico.  Community education officer se lo haya indicado el mdico. SOLICITE ATENCIN MDICA SI:  Sus sntomas no mejoran despus de 3 das de [place].  Aumenta la secrecin o Conservation officer, historic buildings.  Tiene fiebre. ASEGRESE DE QUE:  Comprende estas instrucciones.  Controlar su afeccin.  Recibir ayuda de inmediato si no mejora o si empeora. PARA OBTENER MS INFORMACIN Centros para el control y la prevencin de Probation officer for Disease Control and Prevention, CDC): AppraiserFraud.fi Asociacin Estadounidense de la Salud Sexual (American Sexual Health Association, SHA): www.ashastd.org Esta informacin no tiene Marine scientist el consejo del mdico. Asegrese de hacerle al mdico cualquier pregunta que tenga. Document Released: 12/11/2007 Document Revised: 09/24/2014 Document Reviewed: 04/15/2013 Elsevier Interactive Patient Education  2017 [employer].

## 2016-12-24 ENCOUNTER — Telehealth: Payer: Self-pay | Admitting: *Deleted

## 2016-12-24 ENCOUNTER — Other Ambulatory Visit: Payer: Self-pay | Admitting: Family Medicine

## 2016-12-24 DIAGNOSIS — I10 Essential (primary) hypertension: Secondary | ICD-10-CM

## 2016-12-24 NOTE — Telephone Encounter (Signed)
[facility] lab [phone] reference # [id number] called stating pt HSV swab was never received from the office 12/20/16, I relayed this information to floor nurse Misty to follow up regarding this.

## 2016-12-25 ENCOUNTER — Other Ambulatory Visit: Payer: Self-pay | Admitting: *Deleted

## 2016-12-25 DIAGNOSIS — M818 Other osteoporosis without current pathological fracture: Secondary | ICD-10-CM

## 2016-12-26 ENCOUNTER — Other Ambulatory Visit: Payer: BLUE CROSS/BLUE SHIELD

## 2016-12-26 DIAGNOSIS — M818 Other osteoporosis without current pathological fracture: Secondary | ICD-10-CM

## 2016-12-27 ENCOUNTER — Ambulatory Visit (INDEPENDENT_AMBULATORY_CARE_PROVIDER_SITE_OTHER): Payer: BLUE CROSS/BLUE SHIELD | Admitting: Gynecology

## 2016-12-27 ENCOUNTER — Encounter: Payer: Self-pay | Admitting: Gynecology

## 2016-12-27 DIAGNOSIS — M810 Age-related osteoporosis without current pathological fracture: Secondary | ICD-10-CM | POA: Insufficient documentation

## 2016-12-27 LAB — PTH, INTACT AND CALCIUM
CALCIUM: 8.7 mg/dL (ref 8.6–10.4)
PTH: 32 pg/mL (ref 14–64)

## 2016-12-27 MED ORDER — RISEDRONATE SODIUM 150 MG PO TABS: 150.0000 mg | ORAL_TABLET | ORAL | 11 refills | Status: DC

## 2016-12-27 NOTE — Progress Notes (Signed)
   Patient is a 66 year old that presented to the office today to discuss her most recent bone density study and compare with previous study of 2015. Patient with known history of osteoporosis had been on Boniva. Her most recent bone density study here in the office demonstrated the lowest T score was -2.8 at the right femoral neck with a statistically significant decrease of bone mineralization -2.7% when compared with 2015. Based on her bone density study at appears that her bone mass is 25% below normal and she has 11 times greater risk of hip fracture. I'm recommending that we switch from Boniva to Actonel 150 mg every monthly as an antiresorptive agent whose properties will benefit the patient both the spine and especially peripheral skeletal system. I explained to her the Boniva mostly works at the spine. The following risk for the medication were discussed:  The risk and benefits of oral bisphosphonate therapy were conveyed to the patient in today's visit. Benefits include a significant risk reduction of vertebral, hip and non vertebral fractures. We also discussed potential adverse effects to include precipitation or aggravation of GERD reflux disease as well as the risk of upper GI bleeding although this is reported in the literature to be extremely rare. Other rare adverse effects that were discussed of patient taking oral bisphosphonate included a 1 in 10,000-10,000 risk for osteonecrosis of the jaw, and a risk for atypical femoral fractures of approximately 1 in 5000-10,000. Signs and symptoms of atypical femoral fracture were also discussed and the patient was informed to contact our office if any unusual symptoms were to develop.  Patient had her calcium, vitamin D and PTH level drawn yesterday results pending at time of this dictation. She is currently taking her calcium vitamin D daily. 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 We discussed importance of weightbearing exercises for one hour at least 3 times a week. I have recommended that she have her bone density study repeated in one year to monitor response to therapy. Literature information was provided in Spanish and all questions were answered.  Greater than 90% spent counseling and coordinating care for this patient with osteoporosis. Time of consultation 15 minutes

## 2016-12-27 NOTE — Patient Instructions (Signed)
Risedronate tablets Qu es este medicamento? El RISEDRONATO reduce la prdida de calcio de los [place]. Ayuda a tener [employer] y a Data processing manager prdida de masa sea en pacientes con enfermedad de [place] y osteoporosis. Se puede usar en otras personas con riesgo de prdida de masa sea. Este medicamento puede ser utilizado para otros usos; si tiene alguna pregunta consulte con su proveedor de atencin mdica o con su farmacutico. MARCAS COMUNES: Actonel Qu le debo informar a mi profesional de la salud antes de tomar este medicamento? Necesita saber si usted presenta alguno de los [employer] o situaciones: -enfermedad dental -problemas estomacales, intestinales o esofgicos, como tales como reflujo cido o ERGE -enfermedad renal -bajo nivel de calcio en la sangre -problemas al estar sentado o de pie durante 30 minutos -problemas al tragar -Ardelia Mems reaccin alrgica o inusual al risedronato, a otros medicamentos, alimentos, colorantes o conservantes -si est embarazada o buscando quedar embarazada -si est amamantando a un beb Cmo debo utilizar este medicamento? Debe tomar este medicamento exactamente segn las instrucciones o disminuir la cantidad de medicamento que absorba en el cuerpo y es posible que pueda sufrir daos. Tome este medicamento por va oral en la maana, apenas se levante para Journalist, newspaper. No coma ni beba nada antes de tomar Coca-Cola. Trague las tabletas con un vaso lleno (6 a 8 onzas lquidas) de agua sola. No tome las tabletas con ningn otro lquido. No mastique ni triture la tableta. Despus de tomar Coca-Cola, no coma su desayuno, beba ni tome ningn otro medicamento o vitamina durante al menos 30 minutos. Qudese sentado o parado durante al menos 30 minutos despus de tomar este medicamento; no se acueste. No tome su medicamento con una frecuencia mayor a la indicada. Hable con su pediatra para informarse acerca del uso de este medicamento en  nios. Puede requerir atencin especial. Sobredosis: Pngase en contacto inmediatamente con un centro toxicolgico o una sala de urgencia si usted cree que haya tomado demasiado medicamento. ATENCIN: [employer] es solo para usted. No comparta este medicamento con nadie. Qu sucede si me olvido de una dosis? Si olvida una dosis, no la tome Scientist, research (medical). Tome sus dosis normal la maana siguiente. No tome dosis adicionales o dobles. Qu puede interactuar con este medicamento? -anticidos, tales como hidrxido de aluminio o hidrxido de magnesio -aspirina -suplementos de calcio -suplementos con hierro -AINE, medicamentos para el dolor o inflamacin, como ibuprofeno o naproxeno -hormonas tiroideas -vitaminas con minerales Puede ser que esta lista no menciona todas las posibles interacciones. Informe a su profesional de [employer]	[place] de [employer] productos a base de hierbas, medicamentos de Traver o suplementos nutritivos que est tomando. Si usted fuma, consume bebidas alcohlicas o si utiliza drogas ilegales, indqueselo tambin a su profesional de [employer]	[place]. Algunas sustancias pueden interactuar con su medicamento. A qu debo estar atento al usar Coca-Cola? Visite a su mdico o a su profesional de la salud para chequeos peridicos. Puede ser necesario que transcurra cierto tiempo antes de que pueda observar los beneficios de [place]. Su mdico o su profesional de la salud puede pedirle anlisis de Edith Endave u otras pruebas para Chief Technology Officer su evolucin. Debe asegurarse de que su dieta incluya la cantidad necesaria de calcio y vitamina D mientras est tomando este medicamento, a menos que su mdico indique lo contrario. Hable con su profesional de la salud acerca de sus alimentos y las vitaminas que est tomando. Algunas personas que toman Coca-Cola experimentan  dolor grave de hueso, de articulaciones o/y de msculos. Este medicamento tambin puede aumentar el riesgo de un  fmur roto. Informe a su mdico inmediatamente si tiene dolor en la pierna superior o la ingle. Si experimenta dolor que no desaparece o que empeora, informe a su mdico. Qu efectos secundarios puedo tener al [employer] este medicamento? Efectos secundarios que debe informar a su mdico o a Barrister's clerk de la salud tan pronto como sea posible: Chief of Staff, como erupcin cutnea, picazn o urticarias, e hinchazn de la cara, los labios, la garganta o la lengua heces de color negro o aspecto alquitranado cambios en la visin acidez o dolor estomacal dolor de la mandbula, especialmente despus de tratamiento odontolgico dolor o dificultad al tragar enrojecimiento, formacin de ampollas, descamacin o distensin de la piel, incluso dentro de la boca Efectos secundarios que generalmente no requieren atencin mdica (infrmelos a su mdico o a Barrister's clerk de la salud si persisten o si son molestos): dolores en los [place], msculos o articulaciones cambios en el sentido del gusto diarrea o estreimiento dolor o picazn en el ojo dolor de cabeza nuseas o vmito gases estomacales o sensacin de saciedad Puede ser que esta lista no menciona todos los posibles efectos secundarios. Comunquese a su mdico por asesoramiento mdico [employer]. Usted puede informar los efectos secundarios a la FDA por telfono al 1-800-FDA-1088. Dnde debo guardar mi medicina? Mantngala fuera del alcance de los nios. Gurdela a temperatura ambiente de entre 20 y 2 grados C (3 y 41 grados F). Deseche todo el medicamento que no haya utilizado, despus de la fecha de vencimiento. ATENCIN: Este folleto es un resumen. Puede ser que no cubra toda la posible informacin. Si usted tiene preguntas acerca de esta medicina, consulte con su mdico, su farmacutico o su profesional de Technical sales engineer.  2018 Elsevier/Gold Standard (2016-10-04 00:00:00) Osteoporosis (Osteoporosis) La osteoporosis ocurre cuando los  huesos se vuelven ms frgiles y ms dbiles. Los huesos dbiles pueden romperse (fracturarse) con mayor facilidad cuando se resbala o sufre una cada. Los Continental Airlines corren mayor riesgo de fracturarse son los [place] muecas y la columna vertebral. CUIDADOS EN EL HOGAR  Ingiera suficiente cantidad de calcio y vitamina D. Estos nutrientes son buenos para los [place].  Haga ejercicios tal como le indic el mdico.  No use productos que contengan tabaco, por ejemplo, cigarrillos, tabaco para Higher education careers adviser y Psychologist, sport and exercise. Si necesita ayuda para dejar de fumar, consulte al mdico.  Limite la cantidad de alcohol que bebe.  Tome los medicamentos solamente como se lo haya indicado el mdico.  Concurra a todas las visitas de control como se lo haya indicado el mdico. Esto es importante.  Tome las precauciones necesarias para evitar cadas en su casa. Algunas formas de hacer esto son las siguientes:  Mantenga las habitaciones bien iluminadas y [place].  Coloque barandas de seguridad en las escaleras.  Coloque tapetes de caucho en el bao y en otros sectores que a menudo estn mojadas o son resbaladizas. SOLICITE AYUDA DE INMEDIATO SI:  Se cae.  Sufre una lesin. Esta informacin no tiene Marine scientist el consejo del mdico. Asegrese de hacerle al mdico cualquier pregunta que tenga. Document Released: 03/04/2012 Document Revised: 09/24/2014 Document Reviewed: 02/11/2014 Elsevier Interactive Patient Education  2017 [employer].

## 2016-12-28 ENCOUNTER — Other Ambulatory Visit: Payer: Self-pay | Admitting: Gynecology

## 2016-12-28 ENCOUNTER — Encounter: Payer: Self-pay | Admitting: Gynecology

## 2016-12-28 LAB — SURESWAB HSV, TYPE 1/2 DNA, PCR
HSV 1 DNA: NOT DETECTED
HSV 2 DNA: DETECTED — AB

## 2016-12-28 MED ORDER — VALACYCLOVIR HCL 1 G PO TABS: 1000.0000 mg | ORAL_TABLET | Freq: Every day | ORAL | 0 refills | Status: AC

## 2016-12-29 LAB — VITAMIN D 1,25 DIHYDROXY
VITAMIN D 1, 25 (OH) TOTAL: 40 pg/mL (ref 18–72)
VITAMIN D3 1, 25 (OH): 40 pg/mL
Vitamin D2 1, 25 (OH)2: <8 pg/mL

## 2016-12-30 ENCOUNTER — Other Ambulatory Visit: Payer: Self-pay | Admitting: Family Medicine

## 2016-12-30 DIAGNOSIS — G44029 Chronic cluster headache, not intractable: Secondary | ICD-10-CM

## 2016-12-30 DIAGNOSIS — I10 Essential (primary) hypertension: Secondary | ICD-10-CM

## 2016-12-31 ENCOUNTER — Other Ambulatory Visit: Payer: Self-pay | Admitting: Family Medicine

## 2016-12-31 DIAGNOSIS — G44029 Chronic cluster headache, not intractable: Secondary | ICD-10-CM

## 2016-12-31 DIAGNOSIS — I10 Essential (primary) hypertension: Secondary | ICD-10-CM

## 2017-01-03 ENCOUNTER — Other Ambulatory Visit: Payer: Self-pay | Admitting: Anesthesiology

## 2017-01-03 DIAGNOSIS — Z1211 Encounter for screening for malignant neoplasm of colon: Secondary | ICD-10-CM

## 2017-01-30 ENCOUNTER — Encounter: Payer: Self-pay | Admitting: Gynecology

## 2017-02-25 ENCOUNTER — Other Ambulatory Visit: Payer: Self-pay | Admitting: Adult Health

## 2017-02-25 DIAGNOSIS — Z170 Estrogen receptor positive status [ER+]: Principal | ICD-10-CM

## 2017-02-25 DIAGNOSIS — C50211 Malignant neoplasm of upper-inner quadrant of right female breast: Secondary | ICD-10-CM

## 2017-02-26 ENCOUNTER — Other Ambulatory Visit (HOSPITAL_BASED_OUTPATIENT_CLINIC_OR_DEPARTMENT_OTHER): Payer: BLUE CROSS/BLUE SHIELD

## 2017-02-26 ENCOUNTER — Ambulatory Visit (HOSPITAL_BASED_OUTPATIENT_CLINIC_OR_DEPARTMENT_OTHER): Payer: BLUE CROSS/BLUE SHIELD | Admitting: Oncology

## 2017-02-26 VITALS — BP 109/85 | HR 75 | Temp 98.2°F | Resp 20 | Ht 62.0 in | Wt 148.0 lb

## 2017-02-26 DIAGNOSIS — Z170 Estrogen receptor positive status [ER+]: Secondary | ICD-10-CM

## 2017-02-26 DIAGNOSIS — C50211 Malignant neoplasm of upper-inner quadrant of right female breast: Secondary | ICD-10-CM

## 2017-02-26 DIAGNOSIS — Z79810 Long term (current) use of selective estrogen receptor modulators (SERMs): Secondary | ICD-10-CM

## 2017-02-26 LAB — CBC WITH DIFFERENTIAL/PLATELET
BASO%: 0.4 % (ref 0.0–2.0)
Basophils Absolute: 0 10*3/uL (ref 0.0–0.1)
EOS ABS: 0.1 10*3/uL (ref 0.0–0.5)
EOS%: 2.2 % (ref 0.0–7.0)
HEMATOCRIT: 38.9 % (ref 34.8–46.6)
HEMOGLOBIN: 13.3 g/dL (ref 11.6–15.9)
LYMPH#: 2.1 10*3/uL (ref 0.9–3.3)
LYMPH%: 32.8 % (ref 14.0–49.7)
MCH: 31.9 pg (ref 25.1–34.0)
MCHC: 34.2 g/dL (ref 31.5–36.0)
MCV: 93.4 fL (ref 79.5–101.0)
MONO#: 0.5 10*3/uL (ref 0.1–0.9)
MONO%: 7.4 % (ref 0.0–14.0)
NEUT%: 57.2 % (ref 38.4–76.8)
NEUTROS ABS: 3.7 10*3/uL (ref 1.5–6.5)
Platelets: 214 10*3/uL (ref 145–400)
RBC: 4.16 10*6/uL (ref 3.70–5.45)
RDW: 13 % (ref 11.2–14.5)
WBC: 6.5 10*3/uL (ref 3.9–10.3)

## 2017-02-26 LAB — COMPREHENSIVE METABOLIC PANEL
ALBUMIN: 3.7 g/dL (ref 3.5–5.0)
ALK PHOS: 61 U/L (ref 40–150)
ALT: 24 U/L (ref 0–55)
AST: 20 U/L (ref 5–34)
Anion Gap: 10 mEq/L (ref 3–11)
BILIRUBIN TOTAL: 0.44 mg/dL (ref 0.20–1.20)
BUN: 19.7 mg/dL (ref 7.0–26.0)
CALCIUM: 9.4 mg/dL (ref 8.4–10.4)
CO2: 25 mEq/L (ref 22–29)
Chloride: 104 mEq/L (ref 98–109)
Creatinine: 0.8 mg/dL (ref 0.6–1.1)
EGFR: 75 mL/min/{1.73_m2} — AB (ref >90)
GLUCOSE: 252 mg/dL — AB (ref 70–140)
Potassium: 4 mEq/L (ref 3.5–5.1)
SODIUM: 139 meq/L (ref 136–145)
TOTAL PROTEIN: 7 g/dL (ref 6.4–8.3)

## 2017-02-26 MED ORDER — TAMOXIFEN CITRATE 20 MG PO TABS: 20.0000 mg | ORAL_TABLET | Freq: Every day | ORAL | 4 refills | Status: DC

## 2017-02-26 NOTE — Progress Notes (Signed)
[place]  [phone]) [phone] [phone]) [phone]     ID: Destiny Moody DOB: Aug 19, 1951  MR#: [id number]  KMM#:[id number]  Patient Care Team: Seward Carol, MD as PCP - General (Internal Medicine) Terrance Mass, MD as Consulting Physician (Gynecology) Fanny Skates, MD as Consulting Physician (General Surgery) Thea Silversmith, MD (Inactive) as Consulting Physician (Radiation Oncology) Holley Bouche, NP as Nurse Practitioner (Nurse Practitioner) Magrinat, Virgie Dad, MD as Consulting Physician (Oncology) Sylvan Cheese, NP as Nurse Practitioner (Nurse Practitioner) PCP: Seward Carol, MD OTHER MD:  CHIEF COMPLAINT: Estrogen receptor positive breast cancer  CURRENT TREATMENT:  tamoxifen   BREAST CANCER HISTORY: From Dr. Ernestina Penna intake note dated 01/17/2015:  "She had abnormal screening mammogram in September 2015, underwent biopsy on 06/15/2014 which was negative for malignancy. She developed I small hematoma after biopsy, which required drainage afterwards. She finally had a bilateral breast MRI , which showed a 1.7 cm non-mass enhancement in the medial right breast. She had repeated mammogram on 08/19/2014 which showed again non-mass like enhancement in the right breast which was biopsied before. 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 She tolerates the biopsy well without any complications "  Her subsequent history is as detailed below.  INTERVAL HISTORY: Destiny Moody returns today for follow-up of her estrogen receptor positive breast cancer. She continues on tamoxifen. Hot flashes are not a major problems. She has mild vaginal wetness. She usually uses a pad to deal with this. She pays approximately $10 a month for the drug.   REVIEW OF SYSTEMS:  Destiny Moody is "not doing great" she is not working. She has applied for many jobs but "no one wants to hire me because of my age". Her  family is doing well. She is tolerating Boniva without side effects. She has some insomnia. She has pain in her surgical breast and sometimes her right hip. She complains of hearing loss. She has stress urinary incontinence. She has some arthritis which is not more intense or persistent than before. A detailed review of systems today was otherwise stable  PAST MEDICAL HISTORY: Past Medical History:  Diagnosis Date  . Allergy   . Breast cancer ([place]) 08/24/14   right, upper inner  . Cancer ([place])   . Depression    no meds  . HSV (herpes simplex virus) anogenital infection 2018  . HSV infection   . Hypercholesterolemia   . Hypertension   . Hypothyroidism   . Multinodular thyroid   . NIDDM (non-insulin dependent diabetes mellitus)   . Osteopenia   . Osteoporosis 2015  . Osteoporosis   . Radiation 01/31/15-03/01/15   right  breast  . Rheumatoid arthritis(714.0)    lower back  . Wears contact lenses     PAST SURGICAL HISTORY: Past Surgical History:  Procedure Laterality Date  . BREAST SURGERY     Lumpectomy  . CESAREAN SECTION  [id number]   x 2  . CHOLECYSTECTOMY  1993  . COLONOSCOPY     greater than 12 years but not sure where colonoscopy was performed  . INCISION AND DRAINAGE ABSCESS Right 11/24/2014   Procedure: INCISION AND DRAINAGE RIGHT AXILLA  ABSCESS;  Surgeon: Fanny Skates, MD;  Location: [facility];  Service: General;  Laterality: Right;  . RADIOACTIVE SEED GUIDED MASTECTOMY WITH AXILLARY SENTINEL LYMPH NODE BIOPSY Right 10/12/2014   Procedure: RIGHT  PARTIAL MASTECTOMY WITH RADIOACTIVE SEED LOCALIZATION  RIGHT  AXILLARY SENTINEL  NODE BIOPSY;  Surgeon: Renelda Loma  Dalbert Batman, MD;  Location: [facility];  Service: General;  Laterality: Right;  . TUBAL LIGATION  1996   re annastomosis  . WISDOM TOOTH EXTRACTION      FAMILY HISTORY Family History  Problem Relation Age of Onset  . Diabetes Mother        niddm  . Heart disease Mother   . Heart failure Father   . Heart  disease Father   . Ovarian cancer Cousin 36  . Hyperlipidemia Sister   . Colon cancer Maternal Uncle 55  . Esophageal cancer Neg Hx   . Rectal cancer Neg Hx   . Stomach cancer Neg Hx    the patient's father died from a myocardial infarction at age 7. The patient's mother died at age 37 from complications of diabetes. The patient had no brothers, one sister. There is no history of cancer in the immediate family but the patient has one cousin on the mother's side diagnosed with ovarian cancer before the age of 80. Also that cousin's father, the patient's mother's brother, was diagnosed with colon cancer at age 26  GYNECOLOGIC HISTORY:  No LMP recorded. Patient is postmenopausal. Menarche age 78, first live birth age 25. She is GX P2. She went through the change of life approximately age 68. 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 Her husband [name] is in the rental business. The patient's daughter Lajean Manes works at [employer] in [place], and daughter Elray Mcgregor works at [employer]. The patient has 2 grandchildren. She attends our [place]: Not in place   HEALTH MAINTENANCE: Social History  Substance Use Topics  . Smoking status: Never Smoker  . Smokeless tobacco: Never Used  . Alcohol use 0.0 oz/week     Comment: Occasional     Colonoscopy: September 2017  PAP: Up-to-date  Bone density: Osteoporosis  Lipid panel:  Allergies  Allergen Reactions  . Shellfish Allergy Hives    Current Outpatient Prescriptions  Medication Sig Dispense Refill  . amitriptyline (ELAVIL) 25 MG tablet TAKE ONE TABLET BY MOUTH AT BEDTIME  "OFFICE VISIT NEEDED" 90 tablet 3  . aspirin 81 MG tablet Take 81 mg by mouth daily.    . Calcium Carbonate-Vitamin D 600-200 MG-UNIT TABS Take 1,200 mg by mouth.    . clindamycin (CLEOCIN) 2 % vaginal cream Place 1 Applicatorful vaginally at  bedtime. (Patient not taking: Reported on 12/27/2016) 40 g 2  . fluticasone (FLONASE) 50 MCG/ACT nasal spray USE ONE SPRAY(S) IN EACH NOSTRIL TWICE DAILY 16 g 1  . glimepiride (AMARYL) 2 MG tablet TAKE ONE TABLET BY MOUTH ONCE DAILY BEFORE BREAKFAST. 90 tablet 3  . Glucosamine 500 MG CAPS Take 1 capsule by mouth daily.    Marland Kitchen ibandronate (BONIVA) 150 MG tablet Take 150 mg by mouth every 30 (thirty) days. Take in the morning with a full glass of water, on an empty stomach, and do not take anything else by mouth or lie down for the next 30 min.    Marland Kitchen ibuprofen (ADVIL,MOTRIN) 200 MG tablet Take 200 mg by mouth every 6 (six) hours as needed. Reported on 01/24/2016    . lactobacillus acidophilus (BACID) TABS tablet Take 2 tablets by mouth 3 (three) times daily.    Marland Kitchen lisinopril (PRINIVIL,ZESTRIL) 2.5 MG tablet TAKE ONE TABLET BY MOUTH ONCE DAILY 30 tablet 0  . metFORMIN (GLUCOPHAGE-XR) 500 MG 24  hr tablet TAKE TWO TABLETS BY MOUTH TWICE DAILY 360 tablet 0  . Multiple Vitamin (MULTIVITAMIN) capsule Take 1 capsule by mouth daily.    . Omega-3 Fatty Acids (OMEGA-3 FISH OIL PO) Take by mouth.    . pioglitazone (ACTOS) 45 MG tablet TAKE ONE TABLET BY MOUTH ONCE DAILY....PATIENT NEEDS OFFICE VISIT FOR REFILLS 90 tablet 0  . risedronate (ACTONEL) 150 MG tablet Take 1 tablet (150 mg total) by mouth every 30 (thirty) days. with water on empty stomach, nothing by mouth or lie down for next 30 minutes. 1 tablet 11  . simvastatin (ZOCOR) 20 MG tablet Take 1 tablet (20 mg total) by mouth at bedtime. 90 tablet 3  . tamoxifen (NOLVADEX) 20 MG tablet Take 1 tablet (20 mg total) by mouth daily. 90 tablet 4  . TURMERIC PO Take 1 capsule by mouth daily.     Current Facility-Administered Medications  Medication Dose Route Frequency Provider Last Rate Last Dose  . 0.9 %  sodium chloride infusion  500 mL Intravenous Continuous [name], MD        OBJECTIVE: Middle-aged Latin American woman In no acute  distress  Vitals:   02/26/17 1017  BP: 109/85  Pulse: 75  Resp: 20  Temp: 98.2 F (36.8 C)     Body mass index is 27.07 kg/m.    ECOG FS:1 - Symptomatic but completely ambulatory  Sclerae unicteric, pupils round and equal Oropharynx clear and moist No cervical or supraclavicular adenopathy Lungs no rales or rhonchi Heart regular rate and rhythm Abd soft, nontender, positive bowel sounds MSK no focal spinal tenderness, no upper extremity lymphedema Neuro: nonfocal, well oriented,  depressed affect Breasts: The right breast has undergone lumpectomy followed by radiation with no evidence of local recurrence. Left breast is benign. Both axillae are benign.    LAB RESULTS:  CMP     Component Value Date/Time   NA 138 08/14/2016 1019   K 4.1 08/14/2016 1019   CL 103 07/14/2016 1434   CO2 24 08/14/2016 1019   GLUCOSE 208 (H) 08/14/2016 1019   BUN 15.6 08/14/2016 1019   CREATININE 0.8 08/14/2016 1019   CALCIUM 8.7 12/26/2016 1106   CALCIUM 9.2 08/14/2016 1019   PROT 7.0 08/14/2016 1019   ALBUMIN 3.5 08/14/2016 1019   AST 22 08/14/2016 1019   ALT 25 08/14/2016 1019   ALKPHOS 47 08/14/2016 1019   BILITOT 0.52 08/14/2016 1019   GFRNONAA 89 09/27/2015 2045   GFRAA >89 09/27/2015 2045    INo results found for: SPEP, UPEP  Lab Results  Component Value Date   WBC 6.5 02/26/2017   NEUTROABS 3.7 02/26/2017   HGB 13.3 02/26/2017   HCT 38.9 02/26/2017   MCV 93.4 02/26/2017   PLT 214 02/26/2017      Chemistry      Component Value Date/Time   NA 138 08/14/2016 1019   K 4.1 08/14/2016 1019   CL 103 07/14/2016 1434   CO2 24 08/14/2016 1019   BUN 15.6 08/14/2016 1019   CREATININE 0.8 08/14/2016 1019      Component Value Date/Time   CALCIUM 8.7 12/26/2016 1106   CALCIUM 9.2 08/14/2016 1019   ALKPHOS 47 08/14/2016 1019   AST 22 08/14/2016 1019   ALT 25 08/14/2016 1019   BILITOT 0.52 08/14/2016 1019       No results found for: LABCA2  No components found for:  LABCA125  No results for input(s): INR in the last 168 hours.  Urinalysis    Component Value Date/Time   COLORURINE YELLOW 04/19/2008 1937   APPEARANCEUR CLOUDY (A) 04/19/2008 1937   LABSPEC 1.011 04/19/2008 1937   PHURINE 6.5 04/19/2008 1937   GLUCOSEU NEGATIVE 04/19/2008 1937   HGBUR MODERATE (A) 04/19/2008 1937   BILIRUBINUR negative 09/27/2015 2103   KETONESUR trace (5) (A) 09/27/2015 2103   KETONESUR TRACE (A) 04/19/2008 1937   PROTEINUR negative 09/27/2015 2103   PROTEINUR 30 (A) 04/19/2008 1937   UROBILINOGEN 0.2 09/27/2015 2103   UROBILINOGEN 0.2 04/19/2008 1937   NITRITE Negative 09/27/2015 2103   NITRITE NEGATIVE 04/19/2008 1937   LEUKOCYTESUR Negative 09/27/2015 2103    STUDIES: Bone density [facility] April 2018 found osteoporosis.  ASSESSMENT: 66 y.o. BRCA negative [place] woman status post right breast upper inner quadrant biopsy 08/24/2014 for a clinical T1c N0, stage IA invasive ductal carcinoma, grade 1 or 2, strongly estrogen and progesterone receptor positive, with an MIB-1 of 12%, and no HER-2 amplification  (1) status post right lumpectomy and sentinel lymph node sampling 10/12/2014 for a pT1c pN0, stage IA invasive ductal carcinoma, grade 1, with ample margins; repeat HER-2 again negative  (a) Oncotype DX score of 14 predicts a 10 year outside the breast recurrence rate of 9% of the patient's only systemic therapy is tamoxifen for 5 years.  (2) Adjuvant radiation 01/31/2015-03/01/2015:  Right breast/ 42.72 Gy at 2.67 Gy per fraction x 21 fractions.  Right breast boost/ 10 Gy at 2 Gy per fraction x 5 fractions  (3) started tamoxifen 05/19/2015  (4) genetic testing 04/26/2015 through the OvaNext gene panel offered by [facility] found no deleterious mutations in ATM, BARD1, BRCA1, BRCA2, BRIP1, CDH1, CHEK2, EPCAM, MLH1, MRE11A, MSH2, MSH6, MUTYH, NBN, NF1, PALB2, PMS2, PTEN, RAD50, RAD51C, RAD51D, SMARCA4, STK11, and TP53.    (5) Bone density at [facility] 12/20/2016 shows osteoporosis  PLAN: Destiny Moody  is not on a half years out from definitive surgery for her breast cancer with no evidence of disease recurrence. This is very favorable.  She is tolerating tamoxifen well and the plan will be to continue that for a minimum of 5 years. She may wish to continue it longer even though her knows this is good because of her osteoporosis concerned.  She tolerates Boniva well.  At this point I feel comfortable seeing her on a once a year basis. She knows to call for any problems that may develop before the next visit.   Chauncey Cruel, MD   02/26/2017 10:36 AM Medical Oncology and Hematology [facility] [address] [place], [place] [address] Tel. [phone]    Fax. [phone]

## 2017-03-13 ENCOUNTER — Ambulatory Visit (INDEPENDENT_AMBULATORY_CARE_PROVIDER_SITE_OTHER): Payer: BLUE CROSS/BLUE SHIELD | Admitting: Gynecology

## 2017-03-13 ENCOUNTER — Encounter: Payer: Self-pay | Admitting: Gynecology

## 2017-03-13 ENCOUNTER — Other Ambulatory Visit: Payer: Self-pay | Admitting: Gynecology

## 2017-03-13 VITALS — BP 118/80

## 2017-03-13 DIAGNOSIS — R300 Dysuria: Secondary | ICD-10-CM

## 2017-03-13 DIAGNOSIS — L292 Pruritus vulvae: Secondary | ICD-10-CM | POA: Diagnosis not present

## 2017-03-13 DIAGNOSIS — N898 Other specified noninflammatory disorders of vagina: Secondary | ICD-10-CM | POA: Diagnosis not present

## 2017-03-13 DIAGNOSIS — Z113 Encounter for screening for infections with a predominantly sexual mode of transmission: Secondary | ICD-10-CM

## 2017-03-13 LAB — URINALYSIS W MICROSCOPIC + REFLEX CULTURE
BILIRUBIN URINE: NEGATIVE
CASTS: NONE SEEN [LPF]
Crystals: NONE SEEN [HPF]
Leukocytes, UA: NEGATIVE
Nitrite: NEGATIVE
Protein, ur: NEGATIVE
Specific Gravity, Urine: 1.01 (ref 1.001–1.035)
YEAST: NONE SEEN [HPF]
pH: 7.5 (ref 5.0–8.0)

## 2017-03-13 LAB — WET PREP FOR TRICH, YEAST, CLUE
CLUE CELLS WET PREP: NONE SEEN
TRICH WET PREP: NONE SEEN
YEAST WET PREP: NONE SEEN

## 2017-03-13 NOTE — Progress Notes (Signed)
   Patient is a 66 year old that presented to the office complaining of a slight discharge and vulvar pruritus and burning with urination but only upon completion mostly external urethral irritation than true dysuria. She denies any fever, chills, nausea, vomiting or any back pain. No GI complaints. She was seen in April of this year and was diagnosed with bacterial vaginosis and also in April she was diagnosed with HSV and treated. She would like to have an STD screen today as well. She states her husband has had HSV for many years.  Exam: Pelvic: Bartholin urethra Skene was within normal limits Vagina: No lesions or discharge Cervix: No lesions or discharge Bimanual exam no suprapubic tenderness uterus normal size shape and consistency Adnexa: No palpable mass or tenderness Rectal exam not done  Urinalysis dipstick trace of blood 2+ ketone 3+ glucose microscopic 0-5 WBC, 0-2 RBC few bacteria culture pending  Assessment/plan: Patient with vaginal dryness and irritation possibly contributing to her symptoms especially after intercourse. I'm going to give her a trial Terazol vaginal cream to apply daily at bedtime for one week and then afterwards she can use Replens vaginal moisturizer 2-3 times a week and when she has intercourse she could use Astroglyde. Patient's currently being followed by the medical oncologist as a result of her estrogen positive breast cancer. A GC and Chlamydia culture along with HIV, RPR, hepatitis B and C Will BE obtained today. Urine culture pending.

## 2017-03-13 NOTE — Addendum Note (Signed)
Addended by: Burnett Kanaris on: 03/13/2017 11:57 AM   Modules accepted: Orders

## 2017-03-13 NOTE — Patient Instructions (Signed)
Terconazole vaginal cream Qu es este medicamento? El [place] es un medicamento antimictico. Se utiliza para el tratamiento de infecciones vaginales causadas por levadura. Este medicamento puede ser utilizado para otros usos; si tiene alguna pregunta consulte con su proveedor de atencin mdica o con su Development worker, international aid. MARCAS COMUNES: Terazol 3, Terazol 7, Zazole Qu le debo informar a mi profesional de la salud antes de tomar este medicamento? Necesita saber si usted presenta alguno de los siguientes problemas o situaciones: -una reaccin alrgica o inusual al Materials engineer, a otros antimicticos, a otros medicamentos, alimentos, colorantes o conservantes -si est embarazada o buscando quedar embarazada -si est amamantando a un beb Cmo debo utilizar este medicamento? Este medicamento es para Risk manager solamente en la vagina. No lo ingiera por va oral. Lvese las manos antes y despus de usarlo. Lea las instrucciones del envase antes de usarlo. Utilice este medicamento a la hora de [place], a menos que su mdico o su profesional de la salud indique lo contrario. Enrosque el aplicador en el extremo del tubo y exprima el tubo para Geophysical data processor. Separe el aplicador del tubo. Recustese de espaldas. Introduzca suavemente el extremo del aplicador de manera que penetre profundamente en la vagina y empuje el mbolo para llevar la crema dentro de la vagina. Despus, retire Designer, television/film set. Lave bien el aplicador con agua caliente y [place]. Utilcelo a intervalos regulares. Evite que el medicamento entre en contacto con sus ojos. Si esto ocurre, enjuguelos con abundante agua fra del grifo. Complete todo el tratamiento con el medicamento segn lo haya recetado su mdico o su profesional de la salud, aun si considera que su problema ha mejorado. No deje de [employer] medicamento si su perodo menstrual comienza Optician, dispensing. Hable con su pediatra para informarse acerca del uso de  este medicamento en nios. Puede requerir atencin especial. Sobredosis: Pngase en contacto inmediatamente con un centro toxicolgico o una sala de urgencia si usted cree que haya tomado demasiado medicamento. ATENCIN: [employer] es solo para usted. No comparta este medicamento con nadie. Qu sucede si me olvido de una dosis? Si olvida una dosis, usela lo antes posible. Si es casi la hora de la prxima dosis, use slo esa dosis. No use dosis adicionales o dobles. Qu puede interactuar con este medicamento? No se esperan interacciones. No utilice otros productos vaginales sin consultar a su mdico o a su profesional de [employer]	[place]. Puede ser que esta lista no menciona todas las posibles interacciones. Informe a su profesional de KB Home	[place] de [employer] productos a base de hierbas, medicamentos de [place] o suplementos nutritivos que est tomando. Si usted fuma, consume bebidas alcohlicas o si utiliza drogas ilegales, indqueselo tambin a su profesional de KB Home	[place]. Algunas sustancias pueden interactuar con su medicamento. A qu debo estar atento al usar [employer]? Si los sntomas no mejoran despus de unos Eros, consulte con su mdico o con su profesional de KB Home	[place]. Es preferible que no tenga relaciones sexuales hasta que haya completado el [place]. Si tiene [employer], su pareja deber usar un preservativo para prevenir el contagio de la infeccin. Es posible que su pareja tambin necesite [place]. Por lo general, los medicamentos vaginales salen por la vagina durante el tratamiento. Use toallas higinicas para evitar que el medicamento manche su ropa. No se recomienda el uso de tampones ya que podran Paediatric nurse. Para ayudar a curar la infeccin, use ropa interior recin lavada de algodn; no use ropa sinttica. Sander Nephew  efectos secundarios puedo tener al [employer] medicamento? Efectos secundarios que debe informar a su mdico o a Ship broker de la salud tan pronto como sea posible: -dificultad o dolor al orinar -dolor vaginal Efectos secundarios que, por lo general, no requieren atencin mdica (debe informarlos a su mdico o a su profesional de la salud si persisten o si son molestos): -dolor de cabeza -dolores menstruales -descompostura estomacal -irritacin, picazn o ardor vaginal Puede ser que esta lista no menciona todos los posibles efectos secundarios. Comunquese a su mdico por asesoramiento mdico [employer]. Usted puede informar los efectos secundarios a la FDA por telfono al 1-800-FDA-1088. Dnde debo guardar mi medicina? Mantngala fuera del alcance de los nios. Gurdela a [employer], entre 15 y 35 grados C (18 y 70 grados F). Deseche todo el medicamento que no haya utilizado, despus de la fecha de vencimiento. ATENCIN: Este folleto es un resumen. Puede ser que no cubra toda la posible informacin. Si usted tiene preguntas acerca de esta medicina, consulte con su mdico, su farmacutico o su profesional de Technical sales engineer.  2018 Elsevier/Gold Standard (2014-10-26 00:00:00)

## 2017-03-14 LAB — HEPATITIS C ANTIBODY: HCV Ab: NEGATIVE

## 2017-03-14 LAB — GC/CHLAMYDIA PROBE AMP
CT PROBE, AMP APTIMA: NOT DETECTED
GC PROBE AMP APTIMA: NOT DETECTED

## 2017-03-14 LAB — RPR

## 2017-03-14 LAB — HIV ANTIBODY (ROUTINE TESTING W REFLEX): HIV 1&2 Ab, 4th Generation: NONREACTIVE

## 2017-03-14 LAB — HEPATITIS B SURFACE ANTIGEN: Hepatitis B Surface Ag: NEGATIVE

## 2017-03-15 ENCOUNTER — Other Ambulatory Visit: Payer: Self-pay | Admitting: Gynecology

## 2017-03-15 LAB — URINE CULTURE

## 2017-03-15 MED ORDER — NITROFURANTOIN MONOHYD MACRO 100 MG PO CAPS: 100.0000 mg | ORAL_CAPSULE | Freq: Two times a day (BID) | ORAL | 0 refills | Status: DC

## 2017-03-18 ENCOUNTER — Other Ambulatory Visit: Payer: Self-pay | Admitting: Gynecology

## 2017-03-18 MED ORDER — TERCONAZOLE 0.4 % VA CREA: 1.0000 | TOPICAL_CREAM | Freq: Every day | VAGINAL | 0 refills | Status: DC

## 2017-03-19 ENCOUNTER — Other Ambulatory Visit: Payer: Self-pay | Admitting: Internal Medicine

## 2017-03-19 ENCOUNTER — Ambulatory Visit
Admission: RE | Admit: 2017-03-19 | Discharge: 2017-03-19 | Disposition: A | Discharge status: Home / Self Care | Payer: BLUE CROSS/BLUE SHIELD | Source: Ambulatory Visit | Attending: Internal Medicine | Admitting: Internal Medicine

## 2017-03-19 DIAGNOSIS — M79672 Pain in left foot: Secondary | ICD-10-CM

## 2017-05-13 DIAGNOSIS — Z0271 Encounter for disability determination: Secondary | ICD-10-CM

## 2017-09-06 ENCOUNTER — Ambulatory Visit: Payer: BLUE CROSS/BLUE SHIELD | Admitting: Gynecology

## 2017-09-06 ENCOUNTER — Encounter: Payer: Self-pay | Admitting: Gynecology

## 2017-09-06 VITALS — BP 120/78

## 2017-09-06 DIAGNOSIS — B379 Candidiasis, unspecified: Secondary | ICD-10-CM

## 2017-09-06 DIAGNOSIS — R300 Dysuria: Secondary | ICD-10-CM

## 2017-09-06 LAB — WET PREP FOR TRICH, YEAST, CLUE

## 2017-09-06 MED ORDER — FLUCONAZOLE 150 MG PO TABS: 150.0000 mg | ORAL_TABLET | ORAL | 0 refills | Status: DC

## 2017-09-06 NOTE — Progress Notes (Signed)
    Destiny Moody 1950/12/18 [id number]        66 y.o.  G2P2 presents with several days of vaginal itching and some right sided vulvar irritation.  She also notes mild dysuria.  No frequency, urgency, low back pain, fever or chills.  She does have a history of herpes in the past.  Past medical history,surgical history, problem list, medications, allergies, family history and social history were all reviewed and documented in the EPIC chart.  Directed ROS with pertinent positives and negatives documented in the history of present illness/assessment and plan.  Exam: Caryn Bee assistant Vitals:   09/06/17 1024  BP: 120/78   General appearance:  Normal Abdomen soft nontender without masses guarding rebound Pelvic external BUS vagina with atrophic changes.  Slight white discharge noted.  Bimanual without masses or tenderness  Assessment/Plan:  66 y.o. G2P2 above history and exam.  Wet prep is positive for yeast.  Urine analysis shows moderate bacteriuria but does appear contaminated with 10-20 squamous cells.  Will treat for yeast with Diflucan 150 mg x 2 doses and follow-up on urine culture and treat if grows predominant organism.  Assuming dysuria symptoms resolve and urine culture negative then will follow expectantly.  Patient will follow-up if her symptoms persist, worsen or recur.    Anastasio Auerbach MD, 10:40 AM 09/06/2017

## 2017-09-06 NOTE — Patient Instructions (Signed)
Take the diflucan pill today and repeat in 2 days

## 2017-09-06 NOTE — Addendum Note (Signed)
Addended by: Nelva Nay on: 09/06/2017 12:07 PM   Modules accepted: Orders

## 2017-09-08 LAB — URINALYSIS W MICROSCOPIC + REFLEX CULTURE
BILIRUBIN URINE: NEGATIVE
GLUCOSE, UA: NEGATIVE
HGB URINE DIPSTICK: NEGATIVE
Hyaline Cast: NONE SEEN /LPF
NITRITES URINE, INITIAL: NEGATIVE
PH: 5.5 (ref 5.0–8.0)
Protein, ur: NEGATIVE
RBC / HPF: NONE SEEN /HPF (ref 0–2)
Specific Gravity, Urine: 1.025 (ref 1.001–1.03)

## 2017-09-08 LAB — URINE CULTURE
MICRO NUMBER: 81443887
SPECIMEN QUALITY: ADEQUATE

## 2017-09-08 LAB — CULTURE INDICATED

## 2018-01-09 ENCOUNTER — Other Ambulatory Visit: Payer: Self-pay

## 2018-01-09 MED ORDER — RISEDRONATE SODIUM 150 MG PO TABS: 150.0000 mg | ORAL_TABLET | ORAL | 1 refills | Status: DC

## 2018-01-09 NOTE — Telephone Encounter (Signed)
CE scheduled for May with Dr. Loetta Rough.

## 2018-01-30 ENCOUNTER — Encounter: Payer: BLUE CROSS/BLUE SHIELD | Admitting: Gynecology

## 2018-01-30 ENCOUNTER — Encounter: Payer: Self-pay | Admitting: Gynecology

## 2018-01-30 ENCOUNTER — Ambulatory Visit (INDEPENDENT_AMBULATORY_CARE_PROVIDER_SITE_OTHER): Payer: Medicare Other | Admitting: Gynecology

## 2018-01-30 VITALS — BP 118/78 | Ht 62.0 in | Wt 145.0 lb

## 2018-01-30 DIAGNOSIS — M810 Age-related osteoporosis without current pathological fracture: Secondary | ICD-10-CM

## 2018-01-30 DIAGNOSIS — Z853 Personal history of malignant neoplasm of breast: Secondary | ICD-10-CM | POA: Diagnosis not present

## 2018-01-30 DIAGNOSIS — Z01419 Encounter for gynecological examination (general) (routine) without abnormal findings: Secondary | ICD-10-CM

## 2018-01-30 DIAGNOSIS — N952 Postmenopausal atrophic vaginitis: Secondary | ICD-10-CM

## 2018-01-30 MED ORDER — RISEDRONATE SODIUM 150 MG PO TABS: 150.0000 mg | ORAL_TABLET | ORAL | 12 refills | Status: DC

## 2018-01-30 NOTE — Patient Instructions (Signed)
Call to Schedule your mammogram  [facility]. [address] [place] Phone: [phone]

## 2018-01-30 NOTE — Progress Notes (Signed)
    Destiny Moody 1951-08-11 [id number]        67 y.o.  G2P2 for annual gynecologic exam.  Former patient of Dr. Toney Rakes.  Without gynecologic complaints.  Past medical history,surgical history, problem list, medications, allergies, family history and social history were all reviewed and documented as reviewed in the EPIC chart.  ROS:  Performed with pertinent positives and negatives included in the history, assessment and plan.   Additional significant findings : None   Exam: Caryn Bee assistant Vitals:   01/30/18 1633  BP: 118/78  Weight: 145 lb (65.8 kg)  Height: 5\' 2"  (1.575 m)   Body mass index is 26.52 kg/m.  General appearance:  Normal affect, orientation and appearance. Skin: Grossly normal HEENT: Without gross lesions.  No cervical or supraclavicular adenopathy. Thyroid normal.  Lungs:  Clear without wheezing, rales or rhonchi Cardiac: RR, without RMG Abdominal:  Soft, nontender, without masses, guarding, rebound, organomegaly or hernia Breasts:  Examined lying and sitting.  Left without masses, retractions, discharge or axillary adenopathy.  Right with well-healed right lumpectomy scar.  No definitive masses, retractions or nipple discharge Pelvic:  Ext, BUS, Vagina: With atrophic changes  Cervix: With atrophic changes  Uterus: Anteverted, normal size, shape and contour, midline and mobile nontender   Adnexa: Without masses or tenderness    Anus and perineum: Normal   Rectovaginal: Normal sphincter tone without palpated masses or tenderness.    Assessment/Plan:  67 y.o. G2P2 female for annual gynecologic exam.   1. Postmenopausal/atrophic genital changes.  No significant hot flushes, night sweats, vaginal dryness or any vaginal bleeding. 2. History of right breast cancer.  Has appointment to see Dr. Jana Hakim in June.  Exam NED.  Overdue for mammogram and I strongly recommend that she call and schedule this before she sees Dr. Jana Hakim.  She is currently on  tamoxifen. 3. Osteoporosis.  DEXA 12/2016 T score -2.8.  Had been on Boniva but was switched to Actonel last year by Dr. Toney Rakes.  Will continue on this now she is doing well without side effects.  Refill x1 year provided.  Plan with follow-up DEXA next year at 2-year interval. 4. Colonoscopy 2017.  Repeat at their recommended interval. 5. Pap smear 2018.  No Pap smear done today.  Plan repeat Pap smear at 3-year interval per current screening guidelines. 6. Health maintenance.  No routine lab work done as patient does this elsewhere.  Follow-up 1 year, sooner as needed.   Anastasio Auerbach MD, 5:00 PM 01/30/2018

## 2018-01-31 ENCOUNTER — Other Ambulatory Visit: Payer: Self-pay | Admitting: Oncology

## 2018-01-31 DIAGNOSIS — Z853 Personal history of malignant neoplasm of breast: Secondary | ICD-10-CM

## 2018-02-05 ENCOUNTER — Telehealth: Payer: Self-pay | Admitting: Oncology

## 2018-02-05 NOTE — Telephone Encounter (Signed)
Spoke w/ pt re sch change on 6/13 from 11 am to 1:30 pm.  Pt to see LC.

## 2018-02-06 ENCOUNTER — Ambulatory Visit
Admission: RE | Admit: 2018-02-06 | Discharge: 2018-02-06 | Disposition: A | Discharge status: Home / Self Care | Payer: Medicare Other | Source: Ambulatory Visit | Attending: Oncology | Admitting: Oncology

## 2018-02-06 ENCOUNTER — Other Ambulatory Visit: Payer: Self-pay | Admitting: Oncology

## 2018-02-06 DIAGNOSIS — Z853 Personal history of malignant neoplasm of breast: Secondary | ICD-10-CM

## 2018-02-26 NOTE — Progress Notes (Signed)
[facility]  [phone] [phone]     ID: [name] DOB: [birth year]  MR#: [id number]  QZE#:[id number]  Patient Care Team: Donald Prose, MD as PCP - General (Family Medicine) Terrance Mass, MD as Consulting Physician (Gynecology) Fanny Skates, MD as Consulting Physician (General Surgery) Thea Silversmith, MD as Consulting Physician (Radiation Oncology) Holley Bouche, NP as Nurse Practitioner (Nurse Practitioner) Magrinat, Virgie Dad, MD as Consulting Physician (Oncology) Sylvan Cheese, NP as Nurse Practitioner (Nurse Practitioner) PCP: Donald Prose, MD OTHER MD:  CHIEF COMPLAINT: Estrogen receptor positive breast cancer  CURRENT TREATMENT:  tamoxifen   BREAST CANCER HISTORY: From Dr. Ernestina Penna intake note dated 01/17/2015:  "She had abnormal screening mammogram in September 2015, underwent biopsy on 06/15/2014 which was negative for malignancy. She developed I small hematoma after biopsy, which required drainage afterwards. She finally had a bilateral breast MRI , which showed a 1.7 cm non-mass enhancement in the medial right breast. She had repeated mammogram on 08/19/2014 which showed again non-mass like enhancement in the right breast which was biopsied before. 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 She tolerates the biopsy well without any complications "  Her subsequent history is as detailed below.  INTERVAL HISTORY: Destiny Moody returns today for follow-up of her estrogen receptor positive breast cancer. She continues on tamoxifen. She notes some vaginal discharge.  She has some mild arthralgias, these occur infrequently. She also notes an occasional vaginal dryness.     REVIEW OF SYSTEMS: Destiny Moody sees Dr. Phineas Real, in GYN regularly.  He also manages her osteoporosis.  She is up to date with colon cancer screening.  She has this done every 5 years.  She has  not undergone skin cancer screening.  She exercises by walking regularly about one mile a few days per week.   She had a fan that fell on her head last week and told me it gave her a black eye.  She has had a couple of headaches since this event.  She told me she has not been to her PCP to have this evaluated.  Otherwise, Destiny Moody is doing well and a detailed ROS is non contributory.    PAST MEDICAL HISTORY: Past Medical History:  Diagnosis Date  . Allergy   . Breast cancer ([place]) 08/24/14   right, upper inner  . Cancer ([place])   . Depression    no meds  . HSV (herpes simplex virus) anogenital infection 2018  . HSV infection   . Hypercholesterolemia   . Hypertension   . Hypothyroidism   . Multinodular thyroid   . NIDDM (non-insulin dependent diabetes mellitus)   . Osteopenia   . Osteoporosis 2015  . Osteoporosis   . Radiation 01/31/15-03/01/15   right  breast  . Rheumatoid arthritis(714.0)    lower back  . Wears contact lenses     PAST SURGICAL HISTORY: Past Surgical History:  Procedure Laterality Date  . BREAST SURGERY     Lumpectomy  . CESAREAN SECTION  [id number]   x 2  . CHOLECYSTECTOMY  1993  . COLONOSCOPY     greater than 12 years but not sure where colonoscopy was performed  . INCISION AND DRAINAGE ABSCESS Right 11/24/2014   Procedure: INCISION AND DRAINAGE RIGHT AXILLA  ABSCESS;  Surgeon: Fanny Skates, MD;  Location: [facility];  Service: General;  Laterality: Right;  . RADIOACTIVE SEED GUIDED PARTIAL MASTECTOMY WITH AXILLARY SENTINEL LYMPH NODE BIOPSY Right 10/12/2014  Procedure: RIGHT  PARTIAL MASTECTOMY WITH RADIOACTIVE SEED LOCALIZATION  RIGHT  AXILLARY SENTINEL  NODE BIOPSY;  Surgeon: Fanny Skates, MD;  Location: [facility];  Service: General;  Laterality: Right;  . TUBAL LIGATION  1996   re annastomosis  . WISDOM TOOTH EXTRACTION      FAMILY HISTORY Family History  Problem Relation Age of Onset  . Diabetes Mother        niddm  . Heart disease Mother    . Heart failure Father   . Heart disease Father   . Ovarian cancer Cousin 34  . Hyperlipidemia Sister   . Colon cancer Maternal Uncle 65  . Esophageal cancer Neg Hx   . Rectal cancer Neg Hx   . Stomach cancer Neg Hx    the patient's father died from a myocardial infarction at age 48. The patient's mother died at age 39 from complications of diabetes. The patient had no brothers, one sister. There is no history of cancer in the immediate family but the patient has one cousin on the mother's side diagnosed with ovarian cancer before the age of 36. Also that cousin's father, the patient's mother's brother, was diagnosed with colon cancer at age 65  GYNECOLOGIC HISTORY:  No LMP recorded. Patient is postmenopausal. Menarche age 38, first live birth age 66. She is GX P2. She went through the change of life approximately age 36. 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 Her husband [name] is in the rental business. The patient's daughter Lajean Manes works at [employer] in [place], and daughter Elray Mcgregor works at [employer]. The patient has 2 grandchildren. She attends our [place]: Not in place   HEALTH MAINTENANCE: Social History   Tobacco Use  . Smoking status: Never Smoker  . Smokeless tobacco: Never Used  Substance Use Topics  . Alcohol use: Yes    Alcohol/week: 0.0 oz    Comment: Occasional  . Drug use: No     Colonoscopy: September 2017  PAP: Up-to-date  Bone density: Osteoporosis  Lipid panel:  Allergies  Allergen Reactions  . Shellfish Allergy Hives    Current Outpatient Medications  Medication Sig Dispense Refill  . amitriptyline (ELAVIL) 25 MG tablet TAKE ONE TABLET BY MOUTH AT BEDTIME  "OFFICE VISIT NEEDED" 90 tablet 3  . aspirin 81 MG tablet Take 81 mg by mouth daily.    . Calcium Carbonate-Vitamin D 600-200 MG-UNIT TABS Take 1,200 mg by mouth.      . fluticasone (FLONASE) 50 MCG/ACT nasal spray USE ONE SPRAY(S) IN EACH NOSTRIL TWICE DAILY 16 g 1  . glimepiride (AMARYL) 2 MG tablet TAKE ONE TABLET BY MOUTH ONCE DAILY BEFORE BREAKFAST. 90 tablet 3  . Glucosamine 500 MG CAPS Take 1 capsule by mouth daily.    Marland Kitchen ibuprofen (ADVIL,MOTRIN) 200 MG tablet Take 200 mg by mouth every 6 (six) hours as needed. Reported on 01/24/2016    . lactobacillus acidophilus (BACID) TABS tablet Take 2 tablets by mouth 3 (three) times daily.    Marland Kitchen liraglutide (VICTOZA) 18 MG/3ML SOPN Inject into the skin.    Marland Kitchen lisinopril (PRINIVIL,ZESTRIL) 2.5 MG tablet TAKE ONE TABLET BY MOUTH ONCE DAILY 30 tablet 0  . metFORMIN (GLUCOPHAGE-XR) 500 MG 24 hr tablet TAKE TWO TABLETS BY MOUTH TWICE DAILY 360 tablet 0  . Multiple Vitamin (MULTIVITAMIN) capsule Take 1 capsule by mouth daily.    Marland Kitchen  Omega-3 Fatty Acids (OMEGA-3 FISH OIL PO) Take by mouth.    . pioglitazone (ACTOS) 45 MG tablet TAKE ONE TABLET BY MOUTH ONCE DAILY....PATIENT NEEDS OFFICE VISIT FOR REFILLS 90 tablet 0  . risedronate (ACTONEL) 150 MG tablet Take 1 tablet (150 mg total) by mouth every 30 (thirty) days. with water on empty stomach, nothing by mouth or lie down for next 30 minutes. 1 tablet 12  . simvastatin (ZOCOR) 20 MG tablet Take 1 tablet (20 mg total) by mouth at bedtime. 90 tablet 3  . tamoxifen (NOLVADEX) 20 MG tablet Take 1 tablet (20 mg total) by mouth daily. 90 tablet 4  . TURMERIC PO Take 1 capsule by mouth daily.     Current Facility-Administered Medications  Medication Dose Route Frequency Provider Last Rate Last Dose  . 0.9 %  sodium chloride infusion  500 mL Intravenous Continuous [name], MD        OBJECTIVE:   Vitals:   02/27/18 1341  BP: 112/88  Pulse: 82  Resp: 18  Temp: 97.7 F (36.5 C)  SpO2: 97%     Body mass index is 26.5 kg/m.    ECOG FS:1 - Symptomatic but completely ambulatory GENERAL: Patient is a well appearing female in no acute distress HEENT:  Sclerae  anicteric.  Oropharynx clear and moist. No ulcerations or evidence of oropharyngeal candidiasis. Neck is supple.  NODES:  No cervical, supraclavicular, or axillary lymphadenopathy palpated.  BREAST EXAM:  Right breast s/p lumpectomy, mild amt of scar tissue present, no nodules, masses, left breast without nodules, masses, skin or nipple changes LUNGS:  Clear to auscultation bilaterally.  No wheezes or rhonchi. HEART:  Regular rate and rhythm. No murmur appreciated. ABDOMEN:  Soft, nontender.  Positive, normoactive bowel sounds. No organomegaly palpated. MSK:  No focal spinal tenderness to palpation. Full range of motion bilaterally in the upper extremities. EXTREMITIES:  No peripheral edema.   SKIN:  Clear with no obvious rashes or skin changes. No nail dyscrasia. NEURO:  Nonfocal. Well oriented.  Appropriate affect.      LAB RESULTS:  CMP     Component Value Date/Time   NA 139 02/26/2017 0956   K 4.0 02/26/2017 0956   CL 103 07/14/2016 1434   CO2 25 02/26/2017 0956   GLUCOSE 252 (H) 02/26/2017 0956   BUN 19.7 02/26/2017 0956   CREATININE 0.8 02/26/2017 0956   CALCIUM 9.4 02/26/2017 0956   PROT 7.0 02/26/2017 0956   ALBUMIN 3.7 02/26/2017 0956   AST 20 02/26/2017 0956   ALT 24 02/26/2017 0956   ALKPHOS 61 02/26/2017 0956   BILITOT 0.44 02/26/2017 0956   GFRNONAA 89 09/27/2015 2045   GFRAA >89 09/27/2015 2045    INo results found for: SPEP, UPEP  Lab Results  Component Value Date   WBC 6.2 02/27/2018   NEUTROABS 3.4 02/27/2018   HGB 11.9 02/27/2018   HCT 35.3 02/27/2018   MCV 94.1 02/27/2018   PLT 194 02/27/2018      Chemistry      Component Value Date/Time   NA 139 02/26/2017 0956   K 4.0 02/26/2017 0956   CL 103 07/14/2016 1434   CO2 25 02/26/2017 0956   BUN 19.7 02/26/2017 0956   CREATININE 0.8 02/26/2017 0956      Component Value Date/Time   CALCIUM 9.4 02/26/2017 0956   ALKPHOS 61 02/26/2017 0956   AST 20 02/26/2017 0956   ALT 24 02/26/2017 0956    BILITOT 0.44 02/26/2017 0768  No results found for: LABCA2  No components found for: [id number]  No results for input(s): INR in the last 168 hours.  Urinalysis    Component Value Date/Time   COLORURINE DARK YELLOW 09/06/2017 1230   APPEARANCEUR CLEAR 09/06/2017 1230   LABSPEC 1.025 09/06/2017 1230   PHURINE 5.5 09/06/2017 1230   GLUCOSEU NEGATIVE 09/06/2017 1230   HGBUR NEGATIVE 09/06/2017 1230   BILIRUBINUR NEGATIVE 03/13/2017 1208   BILIRUBINUR negative 09/27/2015 2103   KETONESUR 2+ (A) 09/06/2017 1230   PROTEINUR NEGATIVE 09/06/2017 1230   UROBILINOGEN 0.2 09/27/2015 2103   UROBILINOGEN 0.2 04/19/2008 1937   NITRITE NEGATIVE 03/13/2017 1208   LEUKOCYTESUR NEGATIVE 03/13/2017 1208    STUDIES:   ASSESSMENT: 67 y.o. BRCA negative [place] woman status post right breast upper inner quadrant biopsy 08/24/2014 for a clinical T1c N0, stage IA invasive ductal carcinoma, grade 1 or 2, strongly estrogen and progesterone receptor positive, with an MIB-1 of 12%, and no HER-2 amplification  (1) status post right lumpectomy and sentinel lymph node sampling 10/12/2014 for a pT1c pN0, stage IA invasive ductal carcinoma, grade 1, with ample margins; repeat HER-2 again negative  (a) Oncotype DX score of 14 predicts a 10 year outside the breast recurrence rate of 9% of the patient's only systemic therapy is tamoxifen for 5 years.  (2) Adjuvant radiation 01/31/2015-03/01/2015:  Right breast/ 42.72 Gy at 2.67 Gy per fraction x 21 fractions.  Right breast boost/ 10 Gy at 2 Gy per fraction x 5 fractions  (3) started tamoxifen 05/19/2015  (4) genetic testing 04/26/2015 through the OvaNext gene panel offered by [facility] found no deleterious mutations in ATM, BARD1, BRCA1, BRCA2, BRIP1, CDH1, CHEK2, EPCAM, MLH1, MRE11A, MSH2, MSH6, MUTYH, NBN, NF1, PALB2, PMS2, PTEN, RAD50, RAD51C, RAD51D, SMARCA4, STK11, and TP53.   (5) [facility] at [facility] 12/20/2016  shows osteoporosis  PLAN: Destiny Moody is doing well today.  She will continue on Tamoxifen daily.  She is tolerating it well.  She is up to date with everything but a skin cancer screening.  I recommended that she have this done with dermatology because she does have an area on her back she is concerned about anyway.  I also recommended that she see her PCP for evaluation of her headaches ASAP.    Destiny Moody is without clinical or radiographic signs of recurrence.  She is due for her next mammogram in 01/2019; orders placed today.  I counseled her on healthy diet, and recommended exercise 30 minutes most days of the week, to equal 150 minutes per week.  She will return in 1 year for labs and f/u with Dr. Jana Hakim.  Destiny Moody knows to call for any questions or concerns prior to her next appointment with [place].    A total of (30) minutes of face-to-face time was spent with this patient with greater than 50% of that time in counseling and care-coordination.    Scot Dock, NP   02/27/2018 1:53 PM Medical Oncology and Hematology [facility] [address] [place], [place] [address] Tel. [phone]    Fax. [phone]

## 2018-02-27 ENCOUNTER — Telehealth: Payer: Self-pay | Admitting: Adult Health

## 2018-02-27 ENCOUNTER — Inpatient Hospital Stay: Payer: Medicare Other

## 2018-02-27 ENCOUNTER — Encounter: Payer: Self-pay | Admitting: Adult Health

## 2018-02-27 ENCOUNTER — Inpatient Hospital Stay: Payer: Medicare Other | Attending: Oncology | Admitting: Adult Health

## 2018-02-27 VITALS — BP 112/88 | HR 82 | Temp 97.7°F | Resp 18 | Ht 62.0 in | Wt 144.9 lb

## 2018-02-27 DIAGNOSIS — Z79810 Long term (current) use of selective estrogen receptor modulators (SERMs): Secondary | ICD-10-CM

## 2018-02-27 DIAGNOSIS — Z170 Estrogen receptor positive status [ER+]: Secondary | ICD-10-CM | POA: Diagnosis not present

## 2018-02-27 DIAGNOSIS — C50811 Malignant neoplasm of overlapping sites of right female breast: Secondary | ICD-10-CM | POA: Diagnosis not present

## 2018-02-27 DIAGNOSIS — C50211 Malignant neoplasm of upper-inner quadrant of right female breast: Secondary | ICD-10-CM

## 2018-02-27 LAB — CBC WITH DIFFERENTIAL/PLATELET
Basophils Absolute: 0 10*3/uL (ref 0.0–0.1)
Basophils Relative: 1 %
Eosinophils Absolute: 0.2 10*3/uL (ref 0.0–0.5)
Eosinophils Relative: 3 %
HCT: 35.3 % (ref 34.8–46.6)
Hemoglobin: 11.9 g/dL (ref 11.6–15.9)
Lymphocytes Relative: 33 %
Lymphs Abs: 2 10*3/uL (ref 0.9–3.3)
MCH: 31.7 pg (ref 25.1–34.0)
MCHC: 33.7 g/dL (ref 31.5–36.0)
MCV: 94.1 fL (ref 79.5–101.0)
Monocytes Absolute: 0.6 10*3/uL (ref 0.1–0.9)
Monocytes Relative: 9 %
Neutro Abs: 3.4 10*3/uL (ref 1.5–6.5)
Neutrophils Relative %: 54 %
Platelets: 194 10*3/uL (ref 145–400)
RBC: 3.75 MIL/uL (ref 3.70–5.45)
RDW: 13.2 % (ref 11.2–14.5)
WBC: 6.2 10*3/uL (ref 3.9–10.3)

## 2018-02-27 LAB — COMPREHENSIVE METABOLIC PANEL
ALT: 16 U/L (ref 0–55)
AST: 18 U/L (ref 5–34)
Albumin: 3.8 g/dL (ref 3.5–5.0)
Alkaline Phosphatase: 60 U/L (ref 40–150)
Anion gap: 8 (ref 3–11)
BUN: 19 mg/dL (ref 7–26)
CO2: 24 mmol/L (ref 22–29)
Calcium: 9.4 mg/dL (ref 8.4–10.4)
Chloride: 105 mmol/L (ref 98–109)
Creatinine, Ser: 0.78 mg/dL (ref 0.60–1.10)
GFR calc Af Amer: >60 mL/min (ref >60)
GFR calc non Af Amer: >60 mL/min (ref >60)
Glucose, Bld: 142 mg/dL — ABNORMAL HIGH (ref 70–140)
Potassium: 4.2 mmol/L (ref 3.5–5.1)
Sodium: 137 mmol/L (ref 136–145)
Total Bilirubin: 0.3 mg/dL (ref 0.2–1.2)
Total Protein: 6.7 g/dL (ref 6.4–8.3)

## 2018-02-27 NOTE — Telephone Encounter (Signed)
Gave patient avs and calendar of upcoming June 2020 appointments.  °

## 2018-02-28 ENCOUNTER — Encounter (HOSPITAL_BASED_OUTPATIENT_CLINIC_OR_DEPARTMENT_OTHER): Payer: Self-pay | Admitting: Adult Health

## 2018-02-28 ENCOUNTER — Emergency Department (HOSPITAL_BASED_OUTPATIENT_CLINIC_OR_DEPARTMENT_OTHER): Payer: Medicare Other

## 2018-02-28 ENCOUNTER — Emergency Department (HOSPITAL_BASED_OUTPATIENT_CLINIC_OR_DEPARTMENT_OTHER)
Admission: EM | Admit: 2018-02-28 | Discharge: 2018-02-28 | Disposition: A | Discharge status: Home / Self Care | Payer: Medicare Other | Attending: Emergency Medicine | Admitting: Emergency Medicine

## 2018-02-28 ENCOUNTER — Other Ambulatory Visit: Payer: Self-pay

## 2018-02-28 ENCOUNTER — Encounter: Payer: Self-pay | Admitting: Adult Health

## 2018-02-28 DIAGNOSIS — Z7982 Long term (current) use of aspirin: Secondary | ICD-10-CM | POA: Diagnosis not present

## 2018-02-28 DIAGNOSIS — S0592XA Unspecified injury of left eye and orbit, initial encounter: Secondary | ICD-10-CM | POA: Diagnosis not present

## 2018-02-28 DIAGNOSIS — Z7984 Long term (current) use of oral hypoglycemic drugs: Secondary | ICD-10-CM | POA: Diagnosis not present

## 2018-02-28 DIAGNOSIS — E119 Type 2 diabetes mellitus without complications: Secondary | ICD-10-CM | POA: Insufficient documentation

## 2018-02-28 DIAGNOSIS — Y998 Other external cause status: Secondary | ICD-10-CM | POA: Diagnosis not present

## 2018-02-28 DIAGNOSIS — I10 Essential (primary) hypertension: Secondary | ICD-10-CM | POA: Diagnosis not present

## 2018-02-28 DIAGNOSIS — Z79899 Other long term (current) drug therapy: Secondary | ICD-10-CM | POA: Diagnosis not present

## 2018-02-28 DIAGNOSIS — E039 Hypothyroidism, unspecified: Secondary | ICD-10-CM | POA: Insufficient documentation

## 2018-02-28 DIAGNOSIS — W228XXA Striking against or struck by other objects, initial encounter: Secondary | ICD-10-CM | POA: Diagnosis not present

## 2018-02-28 DIAGNOSIS — Y93E9 Activity, other interior property and clothing maintenance: Secondary | ICD-10-CM | POA: Insufficient documentation

## 2018-02-28 DIAGNOSIS — Y92009 Unspecified place in unspecified non-institutional (private) residence as the place of occurrence of the external cause: Secondary | ICD-10-CM | POA: Diagnosis not present

## 2018-02-28 DIAGNOSIS — Z853 Personal history of malignant neoplasm of breast: Secondary | ICD-10-CM | POA: Diagnosis not present

## 2018-02-28 DIAGNOSIS — S0990XA Unspecified injury of head, initial encounter: Secondary | ICD-10-CM

## 2018-02-28 NOTE — Discharge Instructions (Addendum)
Continue over-the-counter medications as needed for pain.  The CT scan did not show any evidence of fracture or bleeding.  Symptoms should slowly be improving over the next week

## 2018-02-28 NOTE — ED Provider Notes (Signed)
[facility] EMERGENCY DEPARTMENT Provider Note   CSN: [id number] Arrival date & time: 02/28/18  1609     History   Chief Complaint Chief Complaint  Patient presents with  . Eye Injury    HPI Destiny Moody is a 67 y.o. female.  HPI Patient presents to the emergency room for evaluation of a severe headache following a head injury approximately 9 days ago.  Patient states she was hanging up curtains.  She was holding onto the curtain rod she got close to a ceiling fan which struck the curtain rod.  The current rod then smacked her in the side of head and her left eye.  Patient has tried applying ice.  Her headache has persisted at times it is severe on the left side of her head.  She denies any nausea vomiting.  No difficulty with her vision.  She tried to see her doctor today and was referred to an urgent care.  They told her that she needed to have a CT scan and so she was sent to the ED. Past Medical History:  Diagnosis Date  . Allergy   . Breast cancer ([place]) 08/24/14   right, upper inner  . Cancer ([place])   . Depression    no meds  . HSV (herpes simplex virus) anogenital infection 2018  . HSV infection   . Hypercholesterolemia   . Hypertension   . Hypothyroidism   . Multinodular thyroid   . NIDDM (non-insulin dependent diabetes mellitus)   . Osteopenia   . Osteoporosis 2015  . Osteoporosis   . Radiation 01/31/15-03/01/15   right  breast  . Rheumatoid arthritis(714.0)    lower back  . Wears contact lenses     Patient Active Problem List   Diagnosis Date Noted  . Age-related osteoporosis without current pathological fracture 12/27/2016  . Genital herpes in women 12/20/2016  . Wrist pain, chronic, right 08/01/2016  . Genetic testing 04/27/2015  . Family history of colon cancer   . Family history of ovarian cancer   . Diabetes mellitus type II, non insulin dependent ([place]) 03/31/2015  . Malignant neoplasm of upper-inner quadrant of right breast in female,  estrogen receptor positive ([place]) 08/26/2014  . Osteoporosis 08/10/2014  . Vaginal atrophy 04/27/2014  . GERD 04/07/2008  . FATTY LIVER DISEASE 04/07/2008    Past Surgical History:  Procedure Laterality Date  . BREAST SURGERY     Lumpectomy  . CESAREAN SECTION  [id number]   x 2  . CHOLECYSTECTOMY  1993  . COLONOSCOPY     greater than 12 years but not sure where colonoscopy was performed  . INCISION AND DRAINAGE ABSCESS Right 11/24/2014   Procedure: INCISION AND DRAINAGE RIGHT AXILLA  ABSCESS;  Surgeon: Fanny Skates, MD;  Location: [facility];  Service: General;  Laterality: Right;  . RADIOACTIVE SEED GUIDED PARTIAL MASTECTOMY WITH AXILLARY SENTINEL LYMPH NODE BIOPSY Right 10/12/2014   Procedure: RIGHT  PARTIAL MASTECTOMY WITH RADIOACTIVE SEED LOCALIZATION  RIGHT  AXILLARY SENTINEL  NODE BIOPSY;  Surgeon: Fanny Skates, MD;  Location: [facility];  Service: General;  Laterality: Right;  . TUBAL LIGATION  1996   re annastomosis  . WISDOM TOOTH EXTRACTION       OB History    Gravida  2   Para  2   Term      Preterm      AB      Living  2     SAB  TAB      Ectopic      Multiple      Live Births               Home Medications    Prior to Admission medications   Medication Sig Start Date End Date Taking? Authorizing Provider  amitriptyline (ELAVIL) 25 MG tablet TAKE ONE TABLET BY MOUTH AT BEDTIME  "OFFICE VISIT NEEDED" 10/18/15   Copland, Gay Filler, MD  aspirin 81 MG tablet Take 81 mg by mouth daily.    [provider]  Calcium Carbonate-Vitamin D 600-200 MG-UNIT TABS Take 1,200 mg by mouth.    [provider]  fluticasone (FLONASE) 50 MCG/ACT nasal spray USE ONE SPRAY(S) IN EACH NOSTRIL TWICE DAILY 09/06/16   Delia Chimes A, MD  glimepiride (AMARYL) 2 MG tablet TAKE ONE TABLET BY MOUTH ONCE DAILY BEFORE BREAKFAST. 10/18/15   Copland, Gay Filler, MD  Glucosamine 500 MG CAPS Take 1 capsule by mouth daily.    [provider]   ibuprofen (ADVIL,MOTRIN) 200 MG tablet Take 200 mg by mouth every 6 (six) hours as needed. Reported on 01/24/2016    [provider]  lactobacillus acidophilus (BACID) TABS tablet Take 2 tablets by mouth 3 (three) times daily.    [provider]  liraglutide (VICTOZA) 18 MG/3ML SOPN Inject into the skin.    [provider]  lisinopril (PRINIVIL,ZESTRIL) 2.5 MG tablet TAKE ONE TABLET BY MOUTH ONCE DAILY 11/22/16   Copland, Gay Filler, MD  metFORMIN (GLUCOPHAGE-XR) 500 MG 24 hr tablet TAKE TWO TABLETS BY MOUTH TWICE DAILY 10/28/16   Forrest Moron, MD  Multiple Vitamin (MULTIVITAMIN) capsule Take 1 capsule by mouth daily.    [provider]  Omega-3 Fatty Acids (OMEGA-3 FISH OIL PO) Take by mouth.    [provider]  pioglitazone (ACTOS) 45 MG tablet TAKE ONE TABLET BY MOUTH ONCE DAILY....PATIENT NEEDS OFFICE VISIT FOR REFILLS 10/25/16   Tereasa Coop, PA-C  risedronate (ACTONEL) 150 MG tablet Take 1 tablet (150 mg total) by mouth every 30 (thirty) days. with water on empty stomach, nothing by mouth or lie down for next 30 minutes. 01/30/18   Fontaine, Belinda Block, MD  simvastatin (ZOCOR) 20 MG tablet Take 1 tablet (20 mg total) by mouth at bedtime. 10/18/15   Copland, Gay Filler, MD  tamoxifen (NOLVADEX) 20 MG tablet Take 1 tablet (20 mg total) by mouth daily. 02/26/17   Magrinat, Virgie Dad, MD  TURMERIC PO Take 1 capsule by mouth daily.    [provider]    Family History Family History  Problem Relation Age of Onset  . Diabetes Mother        niddm  . Heart disease Mother   . Heart failure Father   . Heart disease Father   . Ovarian cancer Cousin 60  . Hyperlipidemia Sister   . Colon cancer Maternal Uncle 62  . Esophageal cancer Neg Hx   . Rectal cancer Neg Hx   . Stomach cancer Neg Hx     Social History Social History   Tobacco Use  . Smoking status: Never Smoker  . Smokeless tobacco: Never Used  Substance Use Topics  . Alcohol use:  Yes    Alcohol/week: 0.0 oz    Comment: Occasional  . Drug use: No     Allergies   Shellfish allergy   Review of Systems Review of Systems  All other systems reviewed and are negative.    Physical Exam  Updated Vital Signs BP (!) 150/78 (BP Location: Left Arm)   Pulse 98   Temp 98 F (36.7 C) (Oral)   Resp 18   Ht 1.575 m (5\' 2" )   Wt 64.4 kg (142 lb)   SpO2 100%   BMI 25.97 kg/m   Physical Exam  Constitutional: She appears well-developed and well-nourished. No distress.  HENT:  Head: Normocephalic.  Right Ear: External ear normal.  Left Ear: External ear normal.  Faint bruising noted around the left eye, tenderness to palpation in the left temporal region as well as the left inferior forehead along the orbital ridge  Eyes: Pupils are equal, round, and reactive to light. Conjunctivae and EOM are normal. Right eye exhibits no discharge. Left eye exhibits no discharge. No scleral icterus.  No evidence of hyphema, normal pupils  Neck: Neck supple. No tracheal deviation present.  Cardiovascular: Normal rate.  Pulmonary/Chest: Effort normal. No stridor. No respiratory distress.  Abdominal: She exhibits no distension.  Musculoskeletal: She exhibits no edema.  No cervical spine tenderness  Neurological: She is alert. Cranial nerve deficit: no gross deficits.  Skin: Skin is warm and dry. No rash noted.  Psychiatric: She has a normal mood and affect.  Nursing note and vitals reviewed.    ED Treatments / Results   Radiology Ct Head Wo Contrast  Result Date: 02/28/2018 CLINICAL DATA:  Left eye injury. Current rod hit a nearby ceiling fan and the rod bounced back hitting her in the left eye. EXAM: CT HEAD WITHOUT CONTRAST TECHNIQUE: Contiguous axial images were obtained from the base of the skull through the vertex without intravenous contrast. COMPARISON:  None FINDINGS: Brain: No evidence of acute infarction, hemorrhage, hydrocephalus, extra-axial collection or mass  lesion/mass effect. There is mild diffuse low-attenuation within the subcortical and periventricular white matter compatible with chronic microvascular disease. Vascular: No hyperdense vessel or unexpected calcification. Skull: Normal. Negative for fracture or focal lesion. Sinuses/Orbits: No acute finding. Other: None. IMPRESSION: 1. No acute intracranial abnormalities. 2. Chronic small vessel ischemic change and brain atrophy. Electronically Signed   By: Kerby Moors M.D.   On: 02/28/2018 16:53    Procedures Procedures (including critical care time)  Medications Ordered in ED Medications - No data to display   Initial Impression / Assessment and Plan / ED Course  I have reviewed the triage vital signs and the nursing notes.  Pertinent labs & imaging results that were available during my care of the patient were reviewed by me and considered in my medical decision making (see chart for details).   Pt had persistent headache for over a week. Sent in for CT imaging. CT scan does not show evidence of fx or bleeding.     Pt reassured.  Continue supportive meds.  Final Clinical Impressions(s) / ED Diagnoses   Final diagnoses:  Traumatic injury of head, initial encounter    ED Discharge Orders    None       Dorie Rank, MD 02/28/18 1710

## 2018-02-28 NOTE — ED Triage Notes (Signed)
Presents with left eye injury 9 days ago from a ceiling fan blade while hanging curtains. Since the accident left eye has been black and blue and she c/o severe headache that has not gone away behind the left eye. She denies changes in vision.

## 2018-03-21 ENCOUNTER — Telehealth: Payer: Self-pay

## 2018-03-21 ENCOUNTER — Other Ambulatory Visit: Payer: Self-pay | Admitting: Oncology

## 2018-03-21 NOTE — Telephone Encounter (Signed)
Patient left voicemail requesting refill on Tamoxifen.  Refill sent.  Returned patient's call and left voicemail letting patient know medication has been sent to pharmacy.

## 2018-03-25 ENCOUNTER — Other Ambulatory Visit: Payer: Self-pay

## 2018-03-25 MED ORDER — TAMOXIFEN CITRATE 20 MG PO TABS: 20.0000 mg | ORAL_TABLET | Freq: Every day | ORAL | 0 refills | Status: DC

## 2018-09-27 ENCOUNTER — Other Ambulatory Visit: Payer: Self-pay | Admitting: Oncology

## 2018-09-29 ENCOUNTER — Other Ambulatory Visit: Payer: Self-pay | Admitting: *Deleted

## 2018-09-29 MED ORDER — TAMOXIFEN CITRATE 20 MG PO TABS: 20.0000 mg | ORAL_TABLET | Freq: Every day | ORAL | 0 refills | Status: DC

## 2019-01-27 ENCOUNTER — Other Ambulatory Visit: Payer: Self-pay | Admitting: Oncology

## 2019-01-27 DIAGNOSIS — Z853 Personal history of malignant neoplasm of breast: Secondary | ICD-10-CM

## 2019-01-28 ENCOUNTER — Other Ambulatory Visit: Payer: Self-pay

## 2019-02-02 ENCOUNTER — Ambulatory Visit (INDEPENDENT_AMBULATORY_CARE_PROVIDER_SITE_OTHER): Payer: Medicare Other | Admitting: Gynecology

## 2019-02-02 ENCOUNTER — Other Ambulatory Visit: Payer: Self-pay

## 2019-02-02 ENCOUNTER — Encounter: Payer: Self-pay | Admitting: Gynecology

## 2019-02-02 VITALS — BP 118/74 | Ht 62.0 in | Wt 142.0 lb

## 2019-02-02 DIAGNOSIS — R350 Frequency of micturition: Secondary | ICD-10-CM | POA: Diagnosis not present

## 2019-02-02 DIAGNOSIS — N898 Other specified noninflammatory disorders of vagina: Secondary | ICD-10-CM

## 2019-02-02 DIAGNOSIS — M810 Age-related osteoporosis without current pathological fracture: Secondary | ICD-10-CM | POA: Diagnosis not present

## 2019-02-02 DIAGNOSIS — Z01419 Encounter for gynecological examination (general) (routine) without abnormal findings: Secondary | ICD-10-CM | POA: Diagnosis not present

## 2019-02-02 DIAGNOSIS — N952 Postmenopausal atrophic vaginitis: Secondary | ICD-10-CM

## 2019-02-02 LAB — WET PREP FOR TRICH, YEAST, CLUE

## 2019-02-02 MED ORDER — RISEDRONATE SODIUM 150 MG PO TABS: 150.0000 mg | ORAL_TABLET | ORAL | 12 refills | Status: DC

## 2019-02-02 MED ORDER — METRONIDAZOLE 500 MG PO TABS
500.0000 mg | ORAL_TABLET | Freq: Two times a day (BID) | ORAL | 0 refills | Status: DC
Start: 2019-02-02 — End: 2019-05-08

## 2019-02-02 NOTE — Patient Instructions (Signed)
Take the prescribed antibiotic pills twice daily to treat the vaginal infection.  Avoid alcohol while taking.  Follow-up if your symptoms persist.  Office will call if your urine shows evidence of an infection on culture.  Schedule the bone density  Follow-up in 1 year for annual exam

## 2019-02-02 NOTE — Progress Notes (Signed)
    Johnell Landowski 09-08-1951 [id number]        68 y.o.  G2P2 for annual gynecologic exam.  Also complaining of vaginal discharge with odor over the past 7 months or so.  Having some mild itching with this.  Having some urinary frequency but no dysuria, urgency, low back pain, fever or chills.  She has diabetes and has attributed the frequency to this.  Past medical history,surgical history, problem list, medications, allergies, family history and social history were all reviewed and documented as reviewed in the EPIC chart.  ROS:  Performed with pertinent positives and negatives included in the history, assessment and plan.   Additional significant findings : None   Exam: Caryn Bee assistant Vitals:   02/02/19 1135  BP: 118/74  Weight: 142 lb (64.4 kg)  Height: 5\' 2"  (1.575 m)   Body mass index is 25.97 kg/m.  General appearance:  Normal affect, orientation and appearance. Skin: Grossly normal HEENT: Without gross lesions.  No cervical or supraclavicular adenopathy. Thyroid normal.  Lungs:  Clear without wheezing, rales or rhonchi Cardiac: RR, without RMG Abdominal:  Soft, nontender, without masses, guarding, rebound, organomegaly or hernia Breasts:  Examined lying and sitting without masses, retractions, discharge or axillary adenopathy.  Well-healed right lumpectomy scar Pelvic:  Ext, BUS, Vagina: With atrophic changes.  White discharge noted  Cervix: With atrophic changes.  Pap smear done  Uterus: Anteverted, normal size, shape and contour, midline and mobile nontender   Adnexa: Without masses or tenderness    Anus and perineum: Normal   Rectovaginal: Normal sphincter tone without palpated masses or tenderness.    Assessment/Plan:  68 y.o. G2P2 female for annual gynecologic exam.  1. Postmenopausal/atrophic genital changes.  No significant menopausal symptoms or any vaginal bleeding.  Knows to call with any vaginal bleeding. 2. Vaginal discharge/urinary frequency.  Wet  prep is negative.  I suspect she has a low-grade bacterial vaginosis accounting for her lingering discharge with odor.  Discussed treatment options and she elects for Flagyl 500 mg twice daily x7 days.  Alcohol avoidance reviewed.  Discussed her dysuria.  Urine analysis shows few bacteria with some white cells but some squamous cells.  Questionable contaminant.  Will culture and treat accordingly. 3. History of right breast cancer.  Followed by oncology.  Continues on tamoxifen.  Mammography due now and I reminded patient to schedule.  Exam NED. 4. Osteoporosis.  DEXA 2018 T score -2.8.  Had been on Boniva but switched over to Dr. Toney Rakes 2 years ago.  Is doing well with this.  Schedule DEXA now at 2-year interval.  Check vitamin D level.  Continue on Actonel for now. 5. Colonoscopy 2017.  Repeat at their recommended interval. 6. Pap smear 11/2016.  Pap smear repeated today.  No history of abnormal Pap smears previously. 7. Health maintenance.  No routine lab work done as patient does this elsewhere.  Follow-up 1 year, sooner as needed.  Additional time in excess of her routine exam was spent in direct face to face counseling and coordination of care in regards to her vaginal discharge.    Anastasio Auerbach MD, 11:55 AM 02/02/2019

## 2019-02-02 NOTE — Addendum Note (Signed)
Addended by: Nelva Nay on: 02/02/2019 12:29 PM   Modules accepted: Orders

## 2019-02-03 LAB — PAP IG W/ RFLX HPV ASCU

## 2019-02-03 LAB — VITAMIN D 25 HYDROXY (VIT D DEFICIENCY, FRACTURES): Vit D, 25-Hydroxy: 52 ng/mL (ref 30–100)

## 2019-02-05 ENCOUNTER — Other Ambulatory Visit: Payer: Self-pay | Admitting: Gynecology

## 2019-02-05 LAB — URINALYSIS, COMPLETE W/RFL CULTURE
Bilirubin Urine: NEGATIVE
Hgb urine dipstick: NEGATIVE
Hyaline Cast: NONE SEEN /LPF
Leukocyte Esterase: NEGATIVE
Nitrites, Initial: NEGATIVE
Protein, ur: NEGATIVE
RBC / HPF: NONE SEEN /HPF (ref 0–2)
Specific Gravity, Urine: 1.02 (ref 1.001–1.03)
pH: 5 (ref 5.0–8.0)

## 2019-02-05 LAB — URINE CULTURE
MICRO NUMBER:: 487033
SPECIMEN QUALITY:: ADEQUATE

## 2019-02-05 LAB — CULTURE INDICATED

## 2019-02-05 MED ORDER — SULFAMETHOXAZOLE-TRIMETHOPRIM 800-160 MG PO TABS: 1.0000 | ORAL_TABLET | Freq: Two times a day (BID) | ORAL | 0 refills | Status: DC

## 2019-02-17 ENCOUNTER — Other Ambulatory Visit: Payer: Self-pay

## 2019-02-17 ENCOUNTER — Ambulatory Visit
Admission: RE | Admit: 2019-02-17 | Discharge: 2019-02-17 | Disposition: A | Discharge status: Home / Self Care | Payer: Medicare Other | Source: Ambulatory Visit | Attending: Oncology | Admitting: Oncology

## 2019-02-17 DIAGNOSIS — Z853 Personal history of malignant neoplasm of breast: Secondary | ICD-10-CM

## 2019-02-25 NOTE — Progress Notes (Signed)
[place]  [phone] [phone]     ID: Destiny Moody DOB: 1950/11/12  MR#: [id number]  EZM#:[id number]  Patient Care Team: Donald Prose, MD as PCP - General (Family Medicine) Magrinat, Virgie Dad, MD as Consulting Physician (Oncology) Fontaine, Belinda Block, MD as Consulting Physician (Gynecology) OTHER MD:  CHIEF COMPLAINT: Estrogen receptor positive breast cancer  CURRENT TREATMENT:  tamoxifen   INTERVAL HISTORY: Destiny Moody returns today for follow-up of her estrogen receptor positive breast cancer.   She continues on tamoxifen. She reports hair thinning and some mild vaginal discharge. She denies hot flashes     Since her last visit, she underwent bilateral diagnostic mammography with tomography at [facility] on 02/17/2019 showing: breast density category B; no evidence of malignancy in either breast.  She is scheduled to undergo bone density testing on 03/11/2019.   REVIEW OF SYSTEMS: Destiny Moody reports some skin concerns on the left breast, and some new pain in the right breast. She saw a dermatologist, either Dr. Renard Hamper or Dr. Cleophus Molt, she's not sure. She is staying home. She lives with her husband; both of them are retired. She uses her treadmill 5 days a week for 20-30 minutes a day. She states she is a little forgetful. A detailed review of systems was otherwise entirely negative.   BREAST CANCER HISTORY: From Dr. Ernestina Penna intake note dated 01/17/2015:  "She had abnormal screening mammogram in September 2015, underwent biopsy on 06/15/2014 which was negative for malignancy. She developed I small hematoma after biopsy, which required drainage afterwards. She finally had a bilateral breast MRI , which showed a 1.7 cm non-mass enhancement in the medial right breast. She had repeated mammogram on 08/19/2014 which showed again non-mass like enhancement in the right breast which was biopsied before.   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 She tolerates the biopsy well without any complications "  Her subsequent history is as detailed below.   PAST MEDICAL HISTORY: Past Medical History:  Diagnosis Date  . Allergy   . Breast cancer ([place]) 08/24/14   right, upper inner  . Cancer ([place])   . Depression    no meds  . HSV (herpes simplex virus) anogenital infection 2018  . HSV infection   . Hypercholesterolemia   . Hypertension   . Hypothyroidism   . Multinodular thyroid   . NIDDM (non-insulin dependent diabetes mellitus)   . Osteopenia   . Osteoporosis 2015  . Osteoporosis   . Personal history of radiation therapy   . Radiation 01/31/15-03/01/15   right  breast  . Rheumatoid arthritis(714.0)    lower back  . Wears contact lenses     PAST SURGICAL HISTORY: Past Surgical History:  Procedure Laterality Date  . BREAST LUMPECTOMY Right 2016  . BREAST SURGERY     Lumpectomy  . CESAREAN SECTION  [id number]   x 2  . CHOLECYSTECTOMY  1993  . COLONOSCOPY     greater than 12 years but not sure where colonoscopy was performed  . INCISION AND DRAINAGE ABSCESS Right 11/24/2014   Procedure: INCISION AND DRAINAGE RIGHT AXILLA  ABSCESS;  Surgeon: Fanny Skates, MD;  Location: [facility];  Service: General;  Laterality: Right;  . RADIOACTIVE SEED GUIDED PARTIAL MASTECTOMY WITH AXILLARY SENTINEL LYMPH NODE BIOPSY Right 10/12/2014   Procedure: RIGHT  PARTIAL MASTECTOMY WITH RADIOACTIVE SEED LOCALIZATION  RIGHT  AXILLARY SENTINEL  NODE BIOPSY;  Surgeon: Fanny Skates, MD;  Location: [facility];  Service: General;  Laterality: Right;  . TUBAL LIGATION  1996   re annastomosis  . WISDOM TOOTH EXTRACTION      FAMILY HISTORY Family History  Problem Relation Age of Onset  . Diabetes Mother        niddm  . Heart disease Mother   . Heart failure Father   . Heart disease Father   . Ovarian cancer Cousin 54  . Hyperlipidemia Sister   . Colon cancer Maternal Uncle  57  . Esophageal cancer Neg Hx   . Rectal cancer Neg Hx   . Stomach cancer Neg Hx    the patient's father died from a myocardial infarction at age 55. The patient's mother died at age 18 from complications of diabetes. The patient had no brothers, one sister. There is no history of cancer in the immediate family but the patient has one cousin on the mother's side diagnosed with ovarian cancer before the age of 20. Also that cousin's father, the patient's mother's brother, was diagnosed with colon cancer at age 77   GYNECOLOGIC HISTORY:  No LMP recorded. Patient is postmenopausal. Menarche age 32, first live birth age 31. She is GX P2. She went through the change of life approximately age 76. 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 Her husband [name] is in the rental business. The patient's daughter Lajean Manes works at [employer] in [place], and daughter Elray Mcgregor works at [employer]. The patient has 4 grandchildren. She attends our [employer]: Not in place   HEALTH MAINTENANCE: Social History   Tobacco Use  . Smoking status: Never Smoker  . Smokeless tobacco: Never Used  Substance Use Topics  . Alcohol use: Not Currently    Alcohol/week: 0.0 standard drinks  . Drug use: No     Colonoscopy: September 2017  PAP: 02/02/2019, normal  Bone density: Osteoporosis  Lipid panel:  Allergies  Allergen Reactions  . Shellfish Allergy Hives    Current Outpatient Medications  Medication Sig Dispense Refill  . amitriptyline (ELAVIL) 25 MG tablet TAKE ONE TABLET BY MOUTH AT BEDTIME  "OFFICE VISIT NEEDED" 90 tablet 3  . aspirin 81 MG tablet Take 81 mg by mouth daily.    . Calcium Carbonate-Vitamin D 600-200 MG-UNIT TABS Take 1,200 mg by mouth.    Marland Kitchen glimepiride (AMARYL) 2 MG tablet TAKE ONE TABLET BY MOUTH ONCE DAILY BEFORE BREAKFAST. 90 tablet 3  . Glucosamine 500 MG  CAPS Take 1 capsule by mouth daily.    Marland Kitchen ibuprofen (ADVIL,MOTRIN) 200 MG tablet Take 200 mg by mouth every 6 (six) hours as needed. Reported on 01/24/2016    . lisinopril (PRINIVIL,ZESTRIL) 2.5 MG tablet TAKE ONE TABLET BY MOUTH ONCE DAILY 30 tablet 0  . metFORMIN (GLUCOPHAGE-XR) 500 MG 24 hr tablet TAKE TWO TABLETS BY MOUTH TWICE DAILY 360 tablet 0  . metroNIDAZOLE (FLAGYL) 500 MG tablet Take 1 tablet (500 mg total) by mouth 2 (two) times daily. For 7 days.  Avoid alcohol while taking 14 tablet 0  . Multiple Vitamin (MULTIVITAMIN) capsule Take 1 capsule by mouth daily.    . Omega-3 Fatty Acids (OMEGA-3 FISH OIL PO) Take by mouth.    . pioglitazone (ACTOS) 45 MG tablet TAKE ONE TABLET BY MOUTH ONCE DAILY....PATIENT NEEDS OFFICE VISIT FOR REFILLS 90 tablet 0  . risedronate (ACTONEL) 150 MG tablet Take  1 tablet (150 mg total) by mouth every 30 (thirty) days. with water on empty stomach, nothing by mouth or lie down for next 30 minutes. 1 tablet 12  . simvastatin (ZOCOR) 20 MG tablet Take 1 tablet (20 mg total) by mouth at bedtime. 90 tablet 3  . tamoxifen (NOLVADEX) 20 MG tablet Take 1 tablet (20 mg total) by mouth daily. 90 tablet 4  . TURMERIC PO Take 1 capsule by mouth daily.     Current Facility-Administered Medications  Medication Dose Route Frequency Provider Last Rate Last Dose  . 0.9 %  sodium chloride infusion  500 mL Intravenous Continuous Doran Stabler, MD        OBJECTIVE: Middle-aged Hispanic woman in no acute distress  Vitals:   02/26/19 1200  BP: 118/64  Pulse: 76  Resp: 18  Temp: 99.4 F (37.4 C)  SpO2: 100%     Body mass index is 25.48 kg/m.    ECOG FS:1 - Symptomatic but completely ambulatory  Sclerae unicteric, EOMs intact Wearing a mask No cervical or supraclavicular adenopathy Lungs no rales or rhonchi Heart regular rate and rhythm Abd soft, nontender, positive bowel sounds MSK no focal spinal tenderness, no upper extremity lymphedema Neuro: nonfocal, well  oriented, appropriate affect Breasts: Right breast is status post lumpectomy.  There is minimal distortion of the contour.  There is some thickening of the skin as previously noted from the prior radiation.  There is no evidence of disease recurrence.  Left breast is benign.  Both axillae are benign.   LAB RESULTS:  CMP     Component Value Date/Time   NA 137 02/26/2019 1133   NA 139 02/26/2017 0956   K 4.3 02/26/2019 1133   K 4.0 02/26/2017 0956   CL 107 02/26/2019 1133   CO2 21 (L) 02/26/2019 1133   CO2 25 02/26/2017 0956   GLUCOSE 160 (H) 02/26/2019 1133   GLUCOSE 252 (H) 02/26/2017 0956   BUN 14 02/26/2019 1133   BUN 19.7 02/26/2017 0956   CREATININE 0.80 02/26/2019 1133   CREATININE 0.8 02/26/2017 0956   CALCIUM 8.2 (L) 02/26/2019 1133   CALCIUM 9.4 02/26/2017 0956   PROT 7.3 02/26/2019 1133   PROT 7.0 02/26/2017 0956   ALBUMIN 3.9 02/26/2019 1133   ALBUMIN 3.7 02/26/2017 0956   AST 19 02/26/2019 1133   AST 20 02/26/2017 0956   ALT 20 02/26/2019 1133   ALT 24 02/26/2017 0956   ALKPHOS 50 02/26/2019 1133   ALKPHOS 61 02/26/2017 0956   BILITOT 0.4 02/26/2019 1133   BILITOT 0.44 02/26/2017 0956   GFRNONAA >60 02/26/2019 1133   GFRNONAA 89 09/27/2015 2045   GFRAA >60 02/26/2019 1133   GFRAA >89 09/27/2015 2045    INo results found for: SPEP, UPEP  Lab Results  Component Value Date   WBC 6.8 02/26/2019   NEUTROABS 3.7 02/26/2019   HGB 12.5 02/26/2019   HCT 39.0 02/26/2019   MCV 97.5 02/26/2019   PLT 228 02/26/2019      Chemistry      Component Value Date/Time   NA 137 02/26/2019 1133   NA 139 02/26/2017 0956   K 4.3 02/26/2019 1133   K 4.0 02/26/2017 0956   CL 107 02/26/2019 1133   CO2 21 (L) 02/26/2019 1133   CO2 25 02/26/2017 0956   BUN 14 02/26/2019 1133   BUN 19.7 02/26/2017 0956   CREATININE 0.80 02/26/2019 1133   CREATININE 0.8 02/26/2017 0956  Component Value Date/Time   CALCIUM 8.2 (L) 02/26/2019 1133   CALCIUM 9.4 02/26/2017 0956    ALKPHOS 50 02/26/2019 1133   ALKPHOS 61 02/26/2017 0956   AST 19 02/26/2019 1133   AST 20 02/26/2017 0956   ALT 20 02/26/2019 1133   ALT 24 02/26/2017 0956   BILITOT 0.4 02/26/2019 1133   BILITOT 0.44 02/26/2017 0956       No results found for: LABCA2  No components found for: [id number]  No results for input(s): INR in the last 168 hours.  Urinalysis    Component Value Date/Time   COLORURINE YELLOW 02/02/2019 1209   APPEARANCEUR CLEAR 02/02/2019 1209   LABSPEC 1.020 02/02/2019 1209   PHURINE 5.0 02/02/2019 1209   GLUCOSEU 2+ (A) 02/02/2019 1209   HGBUR NEGATIVE 02/02/2019 1209   BILIRUBINUR NEGATIVE 03/13/2017 1208   BILIRUBINUR negative 09/27/2015 2103   KETONESUR TRACE (A) 02/02/2019 1209   PROTEINUR NEGATIVE 02/02/2019 1209   UROBILINOGEN 0.2 09/27/2015 2103   UROBILINOGEN 0.2 04/19/2008 1937   NITRITE NEGATIVE 03/13/2017 1208   LEUKOCYTESUR NEGATIVE 03/13/2017 1208    STUDIES: Mm Diag Breast Tomo Bilateral  Result Date: 02/17/2019 CLINICAL DATA:  Malignant lumpectomy of the UPPER INNER QUADRANT of the RIGHT breast in January, 2016, pathology invasive ductal carcinoma and DCIS with lymphovascular invasion. Adjuvant radiation therapy.Annual evaluation. EXAM: DIGITAL DIAGNOSTIC BILATERAL MAMMOGRAM WITH CAD AND TOMO COMPARISON:  Previous exam(s). ACR Breast Density Category b: There are scattered areas of fibroglandular density. FINDINGS: Tomosynthesis and synthesized full field CC and MLO views of both breasts were obtained. Standard spot magnification MLO view of the lumpectomy site in the RIGHT breast was also obtained. Post surgical scar/architectural distortion at the lumpectomy site in the UPPER RIGHT breast at POSTERIOR depth. Residual mild post radiation skin thickening and trabecular thickening involving the RIGHT breast. No new or suspicious findings in the RIGHT breast. No findings suspicious for malignancy in the LEFT breast. Mammographic images were processed with  CAD. IMPRESSION: 1. No mammographic evidence of malignancy involving either breast. 2. Expected post lumpectomy changes and residual mild post radiation changes involving the RIGHT breast. RECOMMENDATION: BILATERAL diagnostic mammography in 1 year. I have discussed the findings and recommendations with the patient. If applicable, a reminder letter will be sent to the patient regarding the next appointment. BI-RADS CATEGORY  2: Benign. Electronically Signed   By: Evangeline Dakin M.D.   On: 02/17/2019 13:16    ASSESSMENT: 68 y.o. BRCA negative [place] woman status post right breast upper inner quadrant biopsy 08/24/2014 for a clinical T1c N0, stage IA invasive ductal carcinoma, grade 1 or 2, strongly estrogen and progesterone receptor positive, with an MIB-1 of 12%, and no HER-2 amplification  (1) status post right lumpectomy and sentinel lymph node sampling 10/12/2014 for a pT1c pN0, stage IA invasive ductal carcinoma, grade 1, with ample margins; repeat HER-2 again negative  (a) Oncotype DX score of 14 predicts a 10 year outside the breast recurrence rate of 9% of the patient's only systemic therapy is tamoxifen for 5 years.  (2) Adjuvant radiation 01/31/2015-03/01/2015:  Right breast/ 42.72 Gy at 2.67 Gy per fraction x 21 fractions.  Right breast boost/ 10 Gy at 2 Gy per fraction x 5 fractions  (3) started tamoxifen 05/19/2015  (4) genetic testing 04/26/2015 through the [employer]vaNext gene panel offered by [facility] found no deleterious mutations in ATM, BARD1, BRCA1, BRCA2, BRIP1, CDH1, CHEK2, EPCAM, MLH1, MRE11A, MSH2, MSH6, MUTYH, NBN, NF1, PALB2, PMS2, PTEN, RAD50, RAD51C, RAD51D,  SMARCA4, STK11, and TP53.   (5) Bone density at [facility] 12/20/2016 shows osteoporosis  PLAN: Destiny Moody is now approximately 4-1/2 years out from definitive surgery for her breast cancer with no evidence of disease recurrence.  This is very favorable.  She is tolerating tamoxifen generally  well and the plan will be to continue that through August 2021  She is concerned that her husband is becoming demented.  I have suggested she bring this up to her husband's primary care physician for more formal evaluation.  She will see me again in August of next year, after her next mammogram.  Likely that will be her "graduation" visit  She knows to call for any other issue that may develop before then.    Chauncey Cruel, MD   02/26/2019 12:43 PM Medical Oncology and Hematology [facility] [address] [place], [place] [address] Tel. [phone]    Fax. [phone]   I, Wilburn Mylar, am acting as scribe for Dr. Virgie Dad. Magrinat.  I, Lurline Del MD, have reviewed the above documentation for accuracy and completeness, and I agree with the above.

## 2019-02-26 ENCOUNTER — Other Ambulatory Visit: Payer: Self-pay

## 2019-02-26 ENCOUNTER — Inpatient Hospital Stay: Payer: Medicare Other | Attending: Oncology

## 2019-02-26 ENCOUNTER — Inpatient Hospital Stay (HOSPITAL_BASED_OUTPATIENT_CLINIC_OR_DEPARTMENT_OTHER): Payer: Medicare Other | Admitting: Oncology

## 2019-02-26 VITALS — BP 118/64 | HR 76 | Temp 99.4°F | Resp 18 | Ht 62.0 in | Wt 139.3 lb

## 2019-02-26 DIAGNOSIS — Z170 Estrogen receptor positive status [ER+]: Secondary | ICD-10-CM | POA: Insufficient documentation

## 2019-02-26 DIAGNOSIS — C50211 Malignant neoplasm of upper-inner quadrant of right female breast: Secondary | ICD-10-CM

## 2019-02-26 DIAGNOSIS — M810 Age-related osteoporosis without current pathological fracture: Secondary | ICD-10-CM | POA: Insufficient documentation

## 2019-02-26 DIAGNOSIS — Z79810 Long term (current) use of selective estrogen receptor modulators (SERMs): Secondary | ICD-10-CM | POA: Diagnosis not present

## 2019-02-26 LAB — CMP (CANCER CENTER ONLY)
ALT: 20 U/L (ref 0–44)
AST: 19 U/L (ref 15–41)
Albumin: 3.9 g/dL (ref 3.5–5.0)
Alkaline Phosphatase: 50 U/L (ref 38–126)
Anion gap: 9 (ref 5–15)
BUN: 14 mg/dL (ref 8–23)
CO2: 21 mmol/L — ABNORMAL LOW (ref 22–32)
Calcium: 8.2 mg/dL — ABNORMAL LOW (ref 8.9–10.3)
Chloride: 107 mmol/L (ref 98–111)
Creatinine: 0.8 mg/dL (ref 0.44–1.00)
GFR, Est AFR Am: >60 mL/min (ref >60)
GFR, Estimated: >60 mL/min (ref >60)
Glucose, Bld: 160 mg/dL — ABNORMAL HIGH (ref 70–99)
Potassium: 4.3 mmol/L (ref 3.5–5.1)
Sodium: 137 mmol/L (ref 135–145)
Total Bilirubin: 0.4 mg/dL (ref 0.3–1.2)
Total Protein: 7.3 g/dL (ref 6.5–8.1)

## 2019-02-26 LAB — CBC WITH DIFFERENTIAL (CANCER CENTER ONLY)
Abs Immature Granulocytes: 0.02 10*3/uL (ref 0.00–0.07)
Basophils Absolute: 0 10*3/uL (ref 0.0–0.1)
Basophils Relative: 1 %
Eosinophils Absolute: 0.1 10*3/uL (ref 0.0–0.5)
Eosinophils Relative: 1 %
HCT: 39 % (ref 36.0–46.0)
Hemoglobin: 12.5 g/dL (ref 12.0–15.0)
Immature Granulocytes: 0 %
Lymphocytes Relative: 35 %
Lymphs Abs: 2.4 10*3/uL (ref 0.7–4.0)
MCH: 31.3 pg (ref 26.0–34.0)
MCHC: 32.1 g/dL (ref 30.0–36.0)
MCV: 97.5 fL (ref 80.0–100.0)
Monocytes Absolute: 0.5 10*3/uL (ref 0.1–1.0)
Monocytes Relative: 7 %
Neutro Abs: 3.7 10*3/uL (ref 1.7–7.7)
Neutrophils Relative %: 56 %
Platelet Count: 228 10*3/uL (ref 150–400)
RBC: 4 MIL/uL (ref 3.87–5.11)
RDW: 13 % (ref 11.5–15.5)
WBC Count: 6.8 10*3/uL (ref 4.0–10.5)
nRBC: 0 % (ref 0.0–0.2)

## 2019-02-26 MED ORDER — TAMOXIFEN CITRATE 20 MG PO TABS: 20.0000 mg | ORAL_TABLET | Freq: Every day | ORAL | 4 refills | Status: DC

## 2019-03-11 ENCOUNTER — Ambulatory Visit (INDEPENDENT_AMBULATORY_CARE_PROVIDER_SITE_OTHER): Payer: Medicare Other

## 2019-03-11 ENCOUNTER — Other Ambulatory Visit: Payer: Self-pay

## 2019-03-11 ENCOUNTER — Other Ambulatory Visit: Payer: Self-pay | Admitting: Gynecology

## 2019-03-11 DIAGNOSIS — Z780 Asymptomatic menopausal state: Secondary | ICD-10-CM

## 2019-03-11 DIAGNOSIS — M8589 Other specified disorders of bone density and structure, multiple sites: Secondary | ICD-10-CM

## 2019-03-11 DIAGNOSIS — M810 Age-related osteoporosis without current pathological fracture: Secondary | ICD-10-CM

## 2019-03-18 ENCOUNTER — Encounter: Payer: Self-pay | Admitting: Gynecology

## 2019-03-18 ENCOUNTER — Telehealth: Payer: Self-pay | Admitting: Gynecology

## 2019-03-18 NOTE — Telephone Encounter (Signed)
Tell patient that her bone density was stable from her prior study.  I would continue with her medication and plan on repeating the bone density in 2 years.  I would also encourage her to sign up for my chart that would make communication easier

## 2019-03-18 NOTE — Telephone Encounter (Signed)
Left message for pt to call.

## 2019-03-20 NOTE — Telephone Encounter (Signed)
Spoke with patient and read her Dr. Dorette Grate note regarding her result/ recommendations.

## 2019-05-07 ENCOUNTER — Encounter (HOSPITAL_COMMUNITY): Payer: Self-pay

## 2019-05-07 ENCOUNTER — Other Ambulatory Visit: Payer: Self-pay

## 2019-05-07 ENCOUNTER — Emergency Department (HOSPITAL_COMMUNITY): Payer: Medicare Other

## 2019-05-07 ENCOUNTER — Inpatient Hospital Stay (HOSPITAL_COMMUNITY)
Admission: EM | Admit: 2019-05-07 | Discharge: 2019-05-13 | DRG: 872 | Disposition: A | Discharge status: Home Health | Payer: Medicare Other | Attending: Internal Medicine | Admitting: Internal Medicine

## 2019-05-07 DIAGNOSIS — K219 Gastro-esophageal reflux disease without esophagitis: Secondary | ICD-10-CM | POA: Diagnosis present

## 2019-05-07 DIAGNOSIS — Z791 Long term (current) use of non-steroidal anti-inflammatories (NSAID): Secondary | ICD-10-CM

## 2019-05-07 DIAGNOSIS — Z800 Family history of malignant neoplasm of digestive organs: Secondary | ICD-10-CM

## 2019-05-07 DIAGNOSIS — Z8041 Family history of malignant neoplasm of ovary: Secondary | ICD-10-CM

## 2019-05-07 DIAGNOSIS — Z853 Personal history of malignant neoplasm of breast: Secondary | ICD-10-CM

## 2019-05-07 DIAGNOSIS — A419 Sepsis, unspecified organism: Secondary | ICD-10-CM | POA: Diagnosis present

## 2019-05-07 DIAGNOSIS — Z79899 Other long term (current) drug therapy: Secondary | ICD-10-CM

## 2019-05-07 DIAGNOSIS — E1165 Type 2 diabetes mellitus with hyperglycemia: Secondary | ICD-10-CM | POA: Diagnosis not present

## 2019-05-07 DIAGNOSIS — M069 Rheumatoid arthritis, unspecified: Secondary | ICD-10-CM | POA: Diagnosis present

## 2019-05-07 DIAGNOSIS — Z833 Family history of diabetes mellitus: Secondary | ICD-10-CM

## 2019-05-07 DIAGNOSIS — Z20828 Contact with and (suspected) exposure to other viral communicable diseases: Secondary | ICD-10-CM | POA: Diagnosis present

## 2019-05-07 DIAGNOSIS — E118 Type 2 diabetes mellitus with unspecified complications: Secondary | ICD-10-CM

## 2019-05-07 DIAGNOSIS — E041 Nontoxic single thyroid nodule: Secondary | ICD-10-CM | POA: Diagnosis present

## 2019-05-07 DIAGNOSIS — E1169 Type 2 diabetes mellitus with other specified complication: Secondary | ICD-10-CM | POA: Diagnosis present

## 2019-05-07 DIAGNOSIS — K6289 Other specified diseases of anus and rectum: Secondary | ICD-10-CM

## 2019-05-07 DIAGNOSIS — D638 Anemia in other chronic diseases classified elsewhere: Secondary | ICD-10-CM | POA: Diagnosis present

## 2019-05-07 DIAGNOSIS — I10 Essential (primary) hypertension: Secondary | ICD-10-CM | POA: Diagnosis present

## 2019-05-07 DIAGNOSIS — Z8349 Family history of other endocrine, nutritional and metabolic diseases: Secondary | ICD-10-CM

## 2019-05-07 DIAGNOSIS — Z7984 Long term (current) use of oral hypoglycemic drugs: Secondary | ICD-10-CM

## 2019-05-07 DIAGNOSIS — E042 Nontoxic multinodular goiter: Secondary | ICD-10-CM | POA: Diagnosis present

## 2019-05-07 DIAGNOSIS — M810 Age-related osteoporosis without current pathological fracture: Secondary | ICD-10-CM | POA: Diagnosis present

## 2019-05-07 DIAGNOSIS — J302 Other seasonal allergic rhinitis: Secondary | ICD-10-CM | POA: Diagnosis present

## 2019-05-07 DIAGNOSIS — Z8249 Family history of ischemic heart disease and other diseases of the circulatory system: Secondary | ICD-10-CM

## 2019-05-07 DIAGNOSIS — A4151 Sepsis due to Escherichia coli [E. coli]: Principal | ICD-10-CM | POA: Diagnosis present

## 2019-05-07 DIAGNOSIS — R109 Unspecified abdominal pain: Secondary | ICD-10-CM | POA: Diagnosis not present

## 2019-05-07 DIAGNOSIS — E785 Hyperlipidemia, unspecified: Secondary | ICD-10-CM | POA: Diagnosis present

## 2019-05-07 DIAGNOSIS — F329 Major depressive disorder, single episode, unspecified: Secondary | ICD-10-CM | POA: Diagnosis present

## 2019-05-07 DIAGNOSIS — Z7982 Long term (current) use of aspirin: Secondary | ICD-10-CM

## 2019-05-07 DIAGNOSIS — E7800 Pure hypercholesterolemia, unspecified: Secondary | ICD-10-CM | POA: Diagnosis present

## 2019-05-07 DIAGNOSIS — E039 Hypothyroidism, unspecified: Secondary | ICD-10-CM | POA: Diagnosis present

## 2019-05-07 DIAGNOSIS — Z79810 Long term (current) use of selective estrogen receptor modulators (SERMs): Secondary | ICD-10-CM

## 2019-05-07 DIAGNOSIS — Z91013 Allergy to seafood: Secondary | ICD-10-CM

## 2019-05-07 DIAGNOSIS — E119 Type 2 diabetes mellitus without complications: Secondary | ICD-10-CM

## 2019-05-07 DIAGNOSIS — N12 Tubulo-interstitial nephritis, not specified as acute or chronic: Secondary | ICD-10-CM | POA: Diagnosis present

## 2019-05-07 DIAGNOSIS — Z9049 Acquired absence of other specified parts of digestive tract: Secondary | ICD-10-CM

## 2019-05-07 LAB — CBC
HCT: 35.5 % — ABNORMAL LOW (ref 36.0–46.0)
Hemoglobin: 11.9 g/dL — ABNORMAL LOW (ref 12.0–15.0)
MCH: 31.8 pg (ref 26.0–34.0)
MCHC: 33.5 g/dL (ref 30.0–36.0)
MCV: 94.9 fL (ref 80.0–100.0)
Platelets: 185 10*3/uL (ref 150–400)
RBC: 3.74 MIL/uL — ABNORMAL LOW (ref 3.87–5.11)
RDW: 12.8 % (ref 11.5–15.5)
WBC: 16 10*3/uL — ABNORMAL HIGH (ref 4.0–10.5)
nRBC: 0 % (ref 0.0–0.2)

## 2019-05-07 LAB — COMPREHENSIVE METABOLIC PANEL
ALT: 19 U/L (ref 0–44)
AST: 18 U/L (ref 15–41)
Albumin: 3.7 g/dL (ref 3.5–5.0)
Alkaline Phosphatase: 42 U/L (ref 38–126)
Anion gap: 10 (ref 5–15)
BUN: 17 mg/dL (ref 8–23)
CO2: 22 mmol/L (ref 22–32)
Calcium: 9.3 mg/dL (ref 8.9–10.3)
Chloride: 102 mmol/L (ref 98–111)
Creatinine, Ser: 0.76 mg/dL (ref 0.44–1.00)
GFR calc Af Amer: >60 mL/min (ref >60)
GFR calc non Af Amer: >60 mL/min (ref >60)
Glucose, Bld: 206 mg/dL — ABNORMAL HIGH (ref 70–99)
Potassium: 3.9 mmol/L (ref 3.5–5.1)
Sodium: 134 mmol/L — ABNORMAL LOW (ref 135–145)
Total Bilirubin: 0.8 mg/dL (ref 0.3–1.2)
Total Protein: 7.1 g/dL (ref 6.5–8.1)

## 2019-05-07 LAB — URINALYSIS, ROUTINE W REFLEX MICROSCOPIC
Bilirubin Urine: NEGATIVE
Glucose, UA: NEGATIVE mg/dL
Ketones, ur: 20 mg/dL — AB
Nitrite: POSITIVE — AB
Protein, ur: NEGATIVE mg/dL
Specific Gravity, Urine: 1.01 (ref 1.005–1.030)
WBC, UA: >50 WBC/hpf — ABNORMAL HIGH (ref 0–5)
pH: 6 (ref 5.0–8.0)

## 2019-05-07 LAB — LIPASE, BLOOD: Lipase: 37 U/L (ref 11–51)

## 2019-05-07 LAB — LACTIC ACID, PLASMA: Lactic Acid, Venous: 1.8 mmol/L (ref 0.5–1.9)

## 2019-05-07 MED ORDER — SODIUM CHLORIDE 0.9 % IV BOLUS
1000.0000 mL | Freq: Once | INTRAVENOUS | Status: AC
  Administered 2019-05-07: 1000 mL via INTRAVENOUS

## 2019-05-07 MED ORDER — IOHEXOL 300 MG/ML  SOLN
100.0000 mL | Freq: Once | INTRAMUSCULAR | Status: AC | PRN
  Administered 2019-05-08: 100 mL via INTRAVENOUS

## 2019-05-07 MED ORDER — ONDANSETRON HCL 4 MG/2ML IJ SOLN
4.0000 mg | Freq: Once | INTRAMUSCULAR | Status: DC
  Filled 2019-05-07: qty 2

## 2019-05-07 NOTE — ED Provider Notes (Addendum)
[facility] DEPT Provider Note   CSN: [id number] Arrival date & time: 05/07/19  2017     History   Chief Complaint Chief Complaint  Patient presents with  . Abdominal Pain  . Emesis    HPI Destiny Moody is a 68 y.o. female.     68 year old female with a history of right breast cancer (on tamoxifen), hypertension, dyslipidemia, rheumatoid arthritis presents to the emergency department for evaluation of abdominal pain and vomiting.  She states that she was woken up from sleep at 3 AM with shaking chills and some abdominal discomfort.  This has remained constant in her mid abdomen and nonradiating.  Initially experienced anorexia and nausea, but began to have vomiting around lunchtime.  Unable to tolerate food or fluids by mouth secondary to persistent emesis.  Denies any known hematemesis.  She has not had a bowel movement today, but continues to pass flatus.  She had some mild dysuria prior to onset of her chills, but this has spontaneously subsided.  She has not had any persistent urinary urgency or frequency.  Denies known fever over 100.4 F, sick contacts.  Abdominal surgical history significant for cholecystectomy, tubal ligation, cesarean section x2.  The history is provided by the patient. No language interpreter was used.  Abdominal Pain Associated symptoms: vomiting   Emesis Associated symptoms: abdominal pain     Past Medical History:  Diagnosis Date  . Allergy   . Breast cancer ([place]) 08/24/14   right, upper inner  . Cancer ([place])   . Depression    no meds  . HSV (herpes simplex virus) anogenital infection 2018  . Hypercholesterolemia   . Hypertension   . Hypothyroidism   . Multinodular thyroid   . NIDDM (non-insulin dependent diabetes mellitus)   . Osteoporosis 2020   T score -2.5 stable from prior DEXA  . Radiation 01/31/15-03/01/15   right  breast  . Rheumatoid arthritis(714.0)    lower back  . Wears contact lenses     Patient  Active Problem List   Diagnosis Date Noted  . Sepsis secondary to UTI ([place]) 05/08/2019  . Age-related osteoporosis without current pathological fracture 12/27/2016  . Genital herpes in women 12/20/2016  . Wrist pain, chronic, right 08/01/2016  . Genetic testing 04/27/2015  . Family history of colon cancer   . Family history of ovarian cancer   . Diabetes mellitus type II, non insulin dependent ([place]) 03/31/2015  . Malignant neoplasm of upper-inner quadrant of right breast in female, estrogen receptor positive ([place]) 08/26/2014  . Osteoporosis 08/10/2014  . Vaginal atrophy 04/27/2014  . GERD 04/07/2008  . FATTY LIVER DISEASE 04/07/2008    Past Surgical History:  Procedure Laterality Date  . BREAST LUMPECTOMY Right 2016  . BREAST SURGERY     Lumpectomy  . CESAREAN SECTION  1950,9326   x 2  . CHOLECYSTECTOMY  1993  . COLONOSCOPY     greater than 12 years but not sure where colonoscopy was performed  . INCISION AND DRAINAGE ABSCESS Right 11/24/2014   Procedure: INCISION AND DRAINAGE RIGHT AXILLA  ABSCESS;  Surgeon: Fanny Skates, MD;  Location: [facility];  Service: General;  Laterality: Right;  . RADIOACTIVE SEED GUIDED PARTIAL MASTECTOMY WITH AXILLARY SENTINEL LYMPH NODE BIOPSY Right 10/12/2014   Procedure: RIGHT  PARTIAL MASTECTOMY WITH RADIOACTIVE SEED LOCALIZATION  RIGHT  AXILLARY SENTINEL  NODE BIOPSY;  Surgeon: Fanny Skates, MD;  Location: [facility];  Service: General;  Laterality: Right;  . TUBAL LIGATION  1996   re annastomosis  . WISDOM TOOTH EXTRACTION       OB History    Gravida  2   Para  2   Term      Preterm      AB      Living  2     SAB      TAB      Ectopic      Multiple      Live Births               Home Medications    Prior to Admission medications   Medication Sig Start Date End Date Taking? Authorizing Provider  amitriptyline (ELAVIL) 25 MG tablet TAKE ONE TABLET BY MOUTH AT BEDTIME  "OFFICE VISIT NEEDED" 10/18/15  Yes  Copland, Gay Filler, MD  aspirin 81 MG tablet Take 81 mg by mouth daily.   Yes [provider]  Calcium Carbonate-Vitamin D 600-200 MG-UNIT TABS Take 1,200 mg by mouth daily.    Yes [provider]  glimepiride (AMARYL) 2 MG tablet TAKE ONE TABLET BY MOUTH ONCE DAILY BEFORE BREAKFAST. 10/18/15  Yes Copland, Gay Filler, MD  Glucosamine 500 MG CAPS Take 1 capsule by mouth daily.   Yes [provider]  ibuprofen (ADVIL,MOTRIN) 200 MG tablet Take 200 mg by mouth every 6 (six) hours as needed for mild pain or moderate pain. Reported on 01/24/2016   Yes [provider]  liraglutide (VICTOZA) 18 MG/3ML SOPN Inject 1.2 mg into the skin daily.   Yes [provider]  lisinopril (PRINIVIL,ZESTRIL) 2.5 MG tablet TAKE ONE TABLET BY MOUTH ONCE DAILY 11/22/16  Yes Copland, Gay Filler, MD  metFORMIN (GLUCOPHAGE-XR) 500 MG 24 hr tablet TAKE TWO TABLETS BY MOUTH TWICE DAILY Patient taking differently: Take 2,000 mg by mouth every morning.  10/28/16  Yes Stallings, Zoe A, MD  Multiple Vitamin (MULTIVITAMIN) capsule Take 1 capsule by mouth daily.   Yes [provider]  Omega-3 Fatty Acids (OMEGA-3 FISH OIL PO) Take by mouth.   Yes [provider]  pioglitazone (ACTOS) 45 MG tablet TAKE ONE TABLET BY MOUTH ONCE DAILY....PATIENT NEEDS OFFICE VISIT FOR REFILLS 10/25/16  Yes Tereasa Coop, PA-C  risedronate (ACTONEL) 150 MG tablet Take 1 tablet (150 mg total) by mouth every 30 (thirty) days. with water on empty stomach, nothing by mouth or lie down for next 30 minutes. 02/02/19  Yes Fontaine, Belinda Block, MD  simvastatin (ZOCOR) 20 MG tablet Take 1 tablet (20 mg total) by mouth at bedtime. 10/18/15  Yes Copland, Gay Filler, MD  tamoxifen (NOLVADEX) 20 MG tablet Take 1 tablet (20 mg total) by mouth daily. 02/26/19  Yes Magrinat, Virgie Dad, MD  TURMERIC PO Take 1 capsule by mouth daily.   Yes [provider]  metroNIDAZOLE (FLAGYL) 500 MG tablet Take 1 tablet (500 mg  total) by mouth 2 (two) times daily. For 7 days.  Avoid alcohol while taking Patient not taking: Reported on 05/07/2019 02/02/19   Fontaine, Belinda Block, MD    Family History Family History  Problem Relation Age of Onset  . Diabetes Mother        niddm  . Heart disease Mother   . Heart failure Father   . Heart disease Father   . Ovarian cancer Cousin 51  . Hyperlipidemia Sister   . Colon cancer Maternal Uncle 75  . Esophageal cancer Neg Hx   . Rectal cancer Neg Hx   . Stomach cancer Neg  Hx     Social History Social History   Tobacco Use  . Smoking status: Never Smoker  . Smokeless tobacco: Never Used  Substance Use Topics  . Alcohol use: Not Currently    Alcohol/week: 0.0 standard drinks  . Drug use: No     Allergies   Shellfish allergy   Review of Systems Review of Systems  Gastrointestinal: Positive for abdominal pain and vomiting.   Ten systems reviewed and are negative for acute change, except as noted in the HPI.    Physical Exam Updated Vital Signs BP (!) 132/57   Pulse (!) 131   Temp 99.6 F (37.6 C) (Oral)   Resp 20   Ht 5\' 2"  (1.575 m)   Wt 61.2 kg   SpO2 96%   BMI 24.69 kg/m   Physical Exam Vitals signs and nursing note reviewed.  Constitutional:      General: She is not in acute distress.    Appearance: She is well-developed. She is not diaphoretic.     Comments: Nontoxic-appearing and in no acute distress  HENT:     Head: Normocephalic and atraumatic.  Eyes:     General: No scleral icterus.    Conjunctiva/sclera: Conjunctivae normal.  Neck:     Musculoskeletal: Normal range of motion.  Cardiovascular:     Rate and Rhythm: Normal rate and regular rhythm.     Pulses: Normal pulses.  Pulmonary:     Effort: Pulmonary effort is normal. No respiratory distress.     Comments: Respirations even and unlabored Abdominal:     Tenderness: There is abdominal tenderness.     Comments: Soft, nondistended abdomen with focal tenderness in the right  lower quadrant.  Minimal tenderness in the suprapubic abdomen.  No masses or peritoneal signs.  Musculoskeletal: Normal range of motion.  Skin:    General: Skin is warm and dry.     Coloration: Skin is not pale.     Findings: No erythema or rash.  Neurological:     Mental Status: She is alert and oriented to person, place, and time.     Coordination: Coordination normal.  Psychiatric:        Behavior: Behavior normal.      ED Treatments / Results  Labs (all labs ordered are listed, but only abnormal results are displayed) Labs Reviewed  COMPREHENSIVE METABOLIC PANEL - Abnormal; Notable for the following components:      Result Value   Sodium 134 (*)    Glucose, Bld 206 (*)    All other components within normal limits  CBC - Abnormal; Notable for the following components:   WBC 16.0 (*)    RBC 3.74 (*)    Hemoglobin 11.9 (*)    HCT 35.5 (*)    All other components within normal limits  URINALYSIS, ROUTINE W REFLEX MICROSCOPIC - Abnormal; Notable for the following components:   APPearance HAZY (*)    Hgb urine dipstick SMALL (*)    Ketones, ur 20 (*)    Nitrite POSITIVE (*)    Leukocytes,Ua LARGE (*)    WBC, UA >50 (*)    Bacteria, UA MANY (*)    All other components within normal limits  URINE CULTURE  CULTURE, BLOOD (ROUTINE X 2)  CULTURE, BLOOD (ROUTINE X 2)  SARS CORONAVIRUS 2  LIPASE, BLOOD  LACTIC ACID, PLASMA    EKG None  Radiology Ct Abdomen Pelvis W Contrast  Result Date: 05/08/2019 CLINICAL DATA:  Acute abdominal pain. Bloating and vomiting.  EXAM: CT ABDOMEN AND PELVIS WITH CONTRAST TECHNIQUE: Multidetector CT imaging of the abdomen and pelvis was performed using the standard protocol following bolus administration of intravenous contrast. CONTRAST:  156mL OMNIPAQUE IOHEXOL 300 MG/ML  SOLN COMPARISON:  CT 09/28/2015 FINDINGS: Lower chest: Coronary artery calcifications. Heart is normal in size. No focal airspace disease. Hepatobiliary: No focal hepatic  abnormality. Clips in the gallbladder fossa postcholecystectomy. No biliary dilatation. Pancreas: No ductal dilatation or inflammation. Spleen: Normal in size without focal abnormality. Adrenals/Urinary Tract: No adrenal nodule. No hydronephrosis or perinephric edema. Homogeneous renal enhancement with symmetric excretion on delayed phase imaging. Urinary bladder is physiologically distended without wall thickening. Stomach/Bowel: The stomach is nondistended. No small bowel wall thickening, inflammatory change, or obstruction. Normal appendix, series 2, image 53. Small to moderate colonic stool burden. No colonic wall thickening or inflammatory change. There is focal rounded soft tissue prominence involving the posterior wall of the rectum, series 2, image 78, this appears similar in appearance and location of prior exam. Vascular/Lymphatic: Aorta iliac atherosclerosis. No aneurysm. Portal vein is patent. No adenopathy. Reproductive: Uterus and bilateral adnexa are unremarkable. Other: Small fat containing infraumbilical ventral abdominal wall hernia. Small fat containing umbilical hernia. No free air, free fluid, or intra-abdominal fluid collection. Musculoskeletal: Degenerative disc disease and facet arthropathy in the lumbar spine. Degenerative change of both hips with subchondral cystic change in the acetabula. There are no acute or suspicious osseous abnormalities. IMPRESSION: 1. No acute abnormality or explanation for abdominal pain. 2. Focal rounded soft tissue prominence involving the posterior wall of the rectum is similar in location to prior exam. Recommend correlation with colonoscopy to exclude underlying polyp/mass. 3. Small fat containing umbilical and infraumbilical ventral abdominal wall hernias. Aortic Atherosclerosis (ICD10-I70.0). Electronically Signed   By: Keith Rake M.D.   On: 05/08/2019 00:46    Procedures .Critical Care Performed by: Antonietta Breach, PA-C Authorized by: Antonietta Breach, PA-C   Critical care provider statement:    Critical care time (minutes):  45   Critical care was necessary to treat or prevent imminent or life-threatening deterioration of the following conditions:  Sepsis   Critical care was time spent personally by me on the following activities:  Discussions with consultants, evaluation of patient's response to treatment, examination of patient, ordering and performing treatments and interventions, ordering and review of laboratory studies, ordering and review of radiographic studies, pulse oximetry, re-evaluation of patient's condition, obtaining history from patient or surrogate and review of old charts   (including critical care time)  Medications Ordered in ED Medications  ondansetron (ZOFRAN) injection 4 mg (4 mg Intravenous Refused 05/07/19 2312)  sodium chloride (PF) 0.9 % injection (has no administration in time range)  Melatonin TABS 3 mg (has no administration in time range)  sodium chloride 0.9 % bolus 1,000 mL (0 mLs Intravenous Stopped 05/07/19 2341)  iohexol (OMNIPAQUE) 300 MG/ML solution 100 mL (100 mLs Intravenous Contrast Given 05/08/19 0008)  cefTRIAXone (ROCEPHIN) 1 g in sodium chloride 0.9 % 100 mL IVPB (1 g Intravenous New Bag/Given 05/08/19 0106)  ketorolac (TORADOL) 30 MG/ML injection 15 mg (15 mg Intravenous Given 05/08/19 0152)  sodium chloride 0.9 % bolus 1,000 mL (1,000 mLs Intravenous New Bag/Given 05/08/19 0203)  acetaminophen (TYLENOL) tablet 1,000 mg (1,000 mg Oral Given 05/08/19 0130)  ondansetron (ZOFRAN) 4 MG/2ML injection (4 mg  Given 05/08/19 0150)     Initial Impression / Assessment and Plan / ED Course  I have reviewed the triage vital signs and the nursing  notes.  Pertinent labs & imaging results that were available during my care of the patient were reviewed by me and considered in my medical decision making (see chart for details).        68 year old female presents to the emergency department for  abdominal pain with persistent nausea and vomiting.  Symptoms preceded by dysuria.  Her work-up in the emergency department is consistent with clinical pyelonephritis.  Her CT does not show any complicating features.  Lactate reassuring.  However, given leukocytosis, persistent tachycardia, intermittent rigors, plan for admission for continued IV fluids and IV antibiotics.  Case discussed with Dr[facility] Olevia Bowens of [facility] who will admit.   Final Clinical Impressions(s) / ED Diagnoses   Final diagnoses:  Sepsis, due to unspecified organism, unspecified whether acute organ dysfunction present [facility])  Pyelonephritis    ED Discharge Orders    None       Antonietta Breach, PA-C 05/08/19 0208    Antonietta Breach, PA-C 05/08/19 8159    Fatima Blank, MD 05/08/19 [phone]

## 2019-05-07 NOTE — ED Triage Notes (Signed)
Pt reports chills that woke her up last night. Pt states that later in the day she ate dinner which made her feel bloated and she vomited shortly after. Pt states that she was started a new medication for diabetes on Monday.

## 2019-05-08 ENCOUNTER — Telehealth: Payer: Self-pay | Admitting: Gastroenterology

## 2019-05-08 ENCOUNTER — Encounter (HOSPITAL_COMMUNITY): Payer: Self-pay | Admitting: Internal Medicine

## 2019-05-08 DIAGNOSIS — Z79810 Long term (current) use of selective estrogen receptor modulators (SERMs): Secondary | ICD-10-CM | POA: Diagnosis not present

## 2019-05-08 DIAGNOSIS — N390 Urinary tract infection, site not specified: Secondary | ICD-10-CM

## 2019-05-08 DIAGNOSIS — I10 Essential (primary) hypertension: Secondary | ICD-10-CM | POA: Diagnosis present

## 2019-05-08 DIAGNOSIS — E042 Nontoxic multinodular goiter: Secondary | ICD-10-CM | POA: Diagnosis present

## 2019-05-08 DIAGNOSIS — E039 Hypothyroidism, unspecified: Secondary | ICD-10-CM | POA: Diagnosis present

## 2019-05-08 DIAGNOSIS — Z9049 Acquired absence of other specified parts of digestive tract: Secondary | ICD-10-CM | POA: Diagnosis not present

## 2019-05-08 DIAGNOSIS — J302 Other seasonal allergic rhinitis: Secondary | ICD-10-CM | POA: Diagnosis present

## 2019-05-08 DIAGNOSIS — Z20828 Contact with and (suspected) exposure to other viral communicable diseases: Secondary | ICD-10-CM | POA: Diagnosis present

## 2019-05-08 DIAGNOSIS — E041 Nontoxic single thyroid nodule: Secondary | ICD-10-CM | POA: Diagnosis present

## 2019-05-08 DIAGNOSIS — E785 Hyperlipidemia, unspecified: Secondary | ICD-10-CM | POA: Diagnosis present

## 2019-05-08 DIAGNOSIS — E7800 Pure hypercholesterolemia, unspecified: Secondary | ICD-10-CM | POA: Diagnosis present

## 2019-05-08 DIAGNOSIS — Z791 Long term (current) use of non-steroidal anti-inflammatories (NSAID): Secondary | ICD-10-CM | POA: Diagnosis not present

## 2019-05-08 DIAGNOSIS — A419 Sepsis, unspecified organism: Secondary | ICD-10-CM | POA: Diagnosis not present

## 2019-05-08 DIAGNOSIS — Z853 Personal history of malignant neoplasm of breast: Secondary | ICD-10-CM

## 2019-05-08 DIAGNOSIS — F329 Major depressive disorder, single episode, unspecified: Secondary | ICD-10-CM | POA: Diagnosis present

## 2019-05-08 DIAGNOSIS — D638 Anemia in other chronic diseases classified elsewhere: Secondary | ICD-10-CM | POA: Diagnosis present

## 2019-05-08 DIAGNOSIS — I471 Supraventricular tachycardia: Secondary | ICD-10-CM | POA: Diagnosis not present

## 2019-05-08 DIAGNOSIS — Z7984 Long term (current) use of oral hypoglycemic drugs: Secondary | ICD-10-CM | POA: Diagnosis not present

## 2019-05-08 DIAGNOSIS — Z7982 Long term (current) use of aspirin: Secondary | ICD-10-CM | POA: Diagnosis not present

## 2019-05-08 DIAGNOSIS — Z8349 Family history of other endocrine, nutritional and metabolic diseases: Secondary | ICD-10-CM | POA: Diagnosis not present

## 2019-05-08 DIAGNOSIS — A4151 Sepsis due to Escherichia coli [E. coli]: Secondary | ICD-10-CM | POA: Diagnosis present

## 2019-05-08 DIAGNOSIS — M810 Age-related osteoporosis without current pathological fracture: Secondary | ICD-10-CM | POA: Diagnosis present

## 2019-05-08 DIAGNOSIS — E1169 Type 2 diabetes mellitus with other specified complication: Secondary | ICD-10-CM | POA: Diagnosis present

## 2019-05-08 DIAGNOSIS — K219 Gastro-esophageal reflux disease without esophagitis: Secondary | ICD-10-CM | POA: Diagnosis present

## 2019-05-08 DIAGNOSIS — Z79899 Other long term (current) drug therapy: Secondary | ICD-10-CM | POA: Diagnosis not present

## 2019-05-08 DIAGNOSIS — Z91013 Allergy to seafood: Secondary | ICD-10-CM | POA: Diagnosis not present

## 2019-05-08 DIAGNOSIS — N12 Tubulo-interstitial nephritis, not specified as acute or chronic: Secondary | ICD-10-CM | POA: Diagnosis present

## 2019-05-08 DIAGNOSIS — M069 Rheumatoid arthritis, unspecified: Secondary | ICD-10-CM | POA: Diagnosis present

## 2019-05-08 DIAGNOSIS — R109 Unspecified abdominal pain: Secondary | ICD-10-CM | POA: Diagnosis present

## 2019-05-08 DIAGNOSIS — E119 Type 2 diabetes mellitus without complications: Secondary | ICD-10-CM | POA: Diagnosis not present

## 2019-05-08 DIAGNOSIS — E1165 Type 2 diabetes mellitus with hyperglycemia: Secondary | ICD-10-CM | POA: Diagnosis not present

## 2019-05-08 LAB — CBC WITH DIFFERENTIAL/PLATELET
Abs Immature Granulocytes: 0.07 10*3/uL (ref 0.00–0.07)
Basophils Absolute: 0 10*3/uL (ref 0.0–0.1)
Basophils Relative: 0 %
Eosinophils Absolute: 0 10*3/uL (ref 0.0–0.5)
Eosinophils Relative: 0 %
HCT: 30 % — ABNORMAL LOW (ref 36.0–46.0)
Hemoglobin: 10 g/dL — ABNORMAL LOW (ref 12.0–15.0)
Immature Granulocytes: 1 %
Lymphocytes Relative: 4 %
Lymphs Abs: 0.5 10*3/uL — ABNORMAL LOW (ref 0.7–4.0)
MCH: 32.2 pg (ref 26.0–34.0)
MCHC: 33.3 g/dL (ref 30.0–36.0)
MCV: 96.5 fL (ref 80.0–100.0)
Monocytes Absolute: 1.1 10*3/uL — ABNORMAL HIGH (ref 0.1–1.0)
Monocytes Relative: 7 %
Neutro Abs: 13 10*3/uL — ABNORMAL HIGH (ref 1.7–7.7)
Neutrophils Relative %: 88 %
Platelets: 147 10*3/uL — ABNORMAL LOW (ref 150–400)
RBC: 3.11 MIL/uL — ABNORMAL LOW (ref 3.87–5.11)
RDW: 13 % (ref 11.5–15.5)
WBC: 14.6 10*3/uL — ABNORMAL HIGH (ref 4.0–10.5)
nRBC: 0 % (ref 0.0–0.2)

## 2019-05-08 LAB — CBG MONITORING, ED: Glucose-Capillary: 296 mg/dL — ABNORMAL HIGH (ref 70–99)

## 2019-05-08 LAB — BLOOD CULTURE ID PANEL (REFLEXED)

## 2019-05-08 LAB — MAGNESIUM: Magnesium: 1.7 mg/dL (ref 1.7–2.4)

## 2019-05-08 LAB — HEMOGLOBIN A1C
Hgb A1c MFr Bld: 6.8 % — ABNORMAL HIGH (ref 4.8–5.6)
Mean Plasma Glucose: 148.46 mg/dL

## 2019-05-08 LAB — COMPREHENSIVE METABOLIC PANEL
ALT: 16 U/L (ref 0–44)
AST: 16 U/L (ref 15–41)
Albumin: 2.9 g/dL — ABNORMAL LOW (ref 3.5–5.0)
Alkaline Phosphatase: 29 U/L — ABNORMAL LOW (ref 38–126)
Anion gap: 7 (ref 5–15)
BUN: 13 mg/dL (ref 8–23)
CO2: 19 mmol/L — ABNORMAL LOW (ref 22–32)
Calcium: 8 mg/dL — ABNORMAL LOW (ref 8.9–10.3)
Chloride: 113 mmol/L — ABNORMAL HIGH (ref 98–111)
Creatinine, Ser: 0.69 mg/dL (ref 0.44–1.00)
GFR calc Af Amer: >60 mL/min (ref >60)
GFR calc non Af Amer: >60 mL/min (ref >60)
Glucose, Bld: 206 mg/dL — ABNORMAL HIGH (ref 70–99)
Potassium: 3.8 mmol/L (ref 3.5–5.1)
Sodium: 139 mmol/L (ref 135–145)
Total Bilirubin: 0.6 mg/dL (ref 0.3–1.2)
Total Protein: 5.6 g/dL — ABNORMAL LOW (ref 6.5–8.1)

## 2019-05-08 LAB — GLUCOSE, CAPILLARY
Glucose-Capillary: 420 mg/dL — ABNORMAL HIGH (ref 70–99)
Glucose-Capillary: 272 mg/dL — ABNORMAL HIGH (ref 70–99)

## 2019-05-08 LAB — SARS CORONAVIRUS 2 (TAT 6-24 HRS): SARS Coronavirus 2: NEGATIVE

## 2019-05-08 LAB — PHOSPHORUS: Phosphorus: 2.3 mg/dL — ABNORMAL LOW (ref 2.5–4.6)

## 2019-05-08 MED ORDER — SODIUM CHLORIDE (PF) 0.9 % IJ SOLN
INTRAMUSCULAR | Status: AC
  Filled 2019-05-08: qty 50

## 2019-05-08 MED ORDER — NON FORMULARY: 5.0000 mg | Freq: Once | Status: DC

## 2019-05-08 MED ORDER — KETOROLAC TROMETHAMINE 30 MG/ML IJ SOLN
15.0000 mg | Freq: Once | INTRAMUSCULAR | Status: AC
  Administered 2019-05-08: 02:00:00 15 mg via INTRAVENOUS
  Filled 2019-05-08: qty 1

## 2019-05-08 MED ORDER — SODIUM CHLORIDE 0.9 % IV SOLN
1.0000 g | INTRAVENOUS | Status: DC
  Filled 2019-05-08: qty 10

## 2019-05-08 MED ORDER — SODIUM CHLORIDE 0.9 % IV SOLN
1.0000 g | Freq: Once | INTRAVENOUS | Status: AC
  Administered 2019-05-08: 01:00:00 1 g via INTRAVENOUS
  Filled 2019-05-08: qty 10

## 2019-05-08 MED ORDER — SIMVASTATIN 20 MG PO TABS
20.0000 mg | ORAL_TABLET | Freq: Every day | ORAL | Status: DC
  Administered 2019-05-08 – 2019-05-12 (×5): 20 mg via ORAL
  Filled 2019-05-08 (×5): qty 1

## 2019-05-08 MED ORDER — SODIUM CHLORIDE 0.9 % IV SOLN
INTRAVENOUS | Status: DC
  Administered 2019-05-08 – 2019-05-13 (×11): via INTRAVENOUS

## 2019-05-08 MED ORDER — ACETAMINOPHEN 325 MG PO TABS
650.0000 mg | ORAL_TABLET | Freq: Four times a day (QID) | ORAL | Status: DC | PRN
  Administered 2019-05-08 – 2019-05-12 (×7): 650 mg via ORAL
  Filled 2019-05-08 (×8): qty 2

## 2019-05-08 MED ORDER — ACETAMINOPHEN 500 MG PO TABS
1000.0000 mg | ORAL_TABLET | Freq: Once | ORAL | Status: AC
  Administered 2019-05-08: 02:00:00 1000 mg via ORAL
  Filled 2019-05-08: qty 2

## 2019-05-08 MED ORDER — ONDANSETRON HCL 4 MG/2ML IJ SOLN
INTRAMUSCULAR | Status: AC
  Administered 2019-05-08: 02:00:00 4 mg
  Filled 2019-05-08: qty 2

## 2019-05-08 MED ORDER — PIOGLITAZONE HCL 45 MG PO TABS
45.0000 mg | ORAL_TABLET | Freq: Every day | ORAL | Status: DC
  Administered 2019-05-08 – 2019-05-10 (×3): 45 mg via ORAL
  Filled 2019-05-08 (×3): qty 1

## 2019-05-08 MED ORDER — INSULIN ASPART 100 UNIT/ML ~~LOC~~ SOLN
0.0000 [IU] | Freq: Every day | SUBCUTANEOUS | Status: DC
  Administered 2019-05-08 – 2019-05-09 (×2): 3 [IU] via SUBCUTANEOUS
  Administered 2019-05-12: 2 [IU] via SUBCUTANEOUS

## 2019-05-08 MED ORDER — MAGNESIUM SULFATE 2 GM/50ML IV SOLN
2.0000 g | Freq: Once | INTRAVENOUS | Status: AC
  Administered 2019-05-08: 05:00:00 2 g via INTRAVENOUS
  Filled 2019-05-08: qty 50

## 2019-05-08 MED ORDER — SODIUM CHLORIDE 0.9 % IV SOLN
2.0000 g | INTRAVENOUS | Status: DC
  Administered 2019-05-08: 19:00:00 2 g via INTRAVENOUS
  Filled 2019-05-08: qty 2

## 2019-05-08 MED ORDER — LIRAGLUTIDE 18 MG/3ML ~~LOC~~ SOPN: 1.2000 mg | PEN_INJECTOR | Freq: Every day | SUBCUTANEOUS | Status: DC

## 2019-05-08 MED ORDER — AMITRIPTYLINE HCL 25 MG PO TABS
25.0000 mg | ORAL_TABLET | Freq: Every day | ORAL | Status: DC
  Administered 2019-05-08 – 2019-05-12 (×5): 25 mg via ORAL
  Filled 2019-05-08 (×5): qty 1

## 2019-05-08 MED ORDER — TAMOXIFEN CITRATE 20 MG PO TABS: 20.0000 mg | ORAL_TABLET | Freq: Every day | ORAL | Status: DC

## 2019-05-08 MED ORDER — MELATONIN 3 MG PO TABS
3.0000 mg | ORAL_TABLET | Freq: Once | ORAL | Status: AC
  Administered 2019-05-08: 03:00:00 3 mg via ORAL
  Filled 2019-05-08: qty 1

## 2019-05-08 MED ORDER — ASPIRIN EC 81 MG PO TBEC
81.0000 mg | DELAYED_RELEASE_TABLET | Freq: Every day | ORAL | Status: DC
  Administered 2019-05-08 – 2019-05-13 (×6): 81 mg via ORAL
  Filled 2019-05-08 (×7): qty 1

## 2019-05-08 MED ORDER — TAMOXIFEN CITRATE 10 MG PO TABS
20.0000 mg | ORAL_TABLET | Freq: Every day | ORAL | Status: DC
  Administered 2019-05-08 – 2019-05-13 (×6): 20 mg via ORAL
  Filled 2019-05-08 (×6): qty 2

## 2019-05-08 MED ORDER — SODIUM CHLORIDE 0.9 % IV BOLUS
1000.0000 mL | Freq: Once | INTRAVENOUS | Status: AC
  Administered 2019-05-08: 03:00:00 1000 mL via INTRAVENOUS

## 2019-05-08 MED ORDER — ASPIRIN 81 MG PO TABS: 81.0000 mg | ORAL_TABLET | Freq: Every day | ORAL | Status: DC

## 2019-05-08 MED ORDER — MELATONIN 5 MG PO TABS
5.0000 mg | ORAL_TABLET | Freq: Once | ORAL | Status: AC
  Administered 2019-05-08: 5 mg via ORAL
  Filled 2019-05-08: qty 1

## 2019-05-08 MED ORDER — INSULIN ASPART 100 UNIT/ML ~~LOC~~ SOLN
0.0000 [IU] | Freq: Three times a day (TID) | SUBCUTANEOUS | Status: DC
  Administered 2019-05-08: 17:00:00 9 [IU] via SUBCUTANEOUS
  Administered 2019-05-09: 13:00:00 5 [IU] via SUBCUTANEOUS
  Administered 2019-05-09 (×2): 3 [IU] via SUBCUTANEOUS
  Administered 2019-05-10: 2 [IU] via SUBCUTANEOUS
  Administered 2019-05-10: 12:00:00 5 [IU] via SUBCUTANEOUS

## 2019-05-08 MED ORDER — SODIUM CHLORIDE 0.9 % IV BOLUS
1000.0000 mL | Freq: Once | INTRAVENOUS | Status: AC
  Administered 2019-05-08: 02:00:00 1000 mL via INTRAVENOUS

## 2019-05-08 MED ORDER — ENOXAPARIN SODIUM 40 MG/0.4ML ~~LOC~~ SOLN
40.0000 mg | SUBCUTANEOUS | Status: DC
  Administered 2019-05-08 – 2019-05-12 (×5): 40 mg via SUBCUTANEOUS
  Filled 2019-05-08 (×5): qty 0.4

## 2019-05-08 NOTE — ED Notes (Signed)
Called lab to add on urine culture ?

## 2019-05-08 NOTE — Progress Notes (Signed)
PHARMACY - PHYSICIAN COMMUNICATION CRITICAL VALUE ALERT - BLOOD CULTURE IDENTIFICATION (BCID)  Destiny Moody is an 68 y.o. female who presented to [facility] on 05/07/2019 with a chief complaint of chills and vomiting. Patient was started on ceftriaxone 1gm IV q24h for suspected sepsis econdary to UTI.  Name of physician (or Provider) Contacted: Dr. Pietro Cassis  Current antibiotics: ceftriaxone  Changes to prescribed antibiotics recommended:  - change ceftriaxone dose to 2gm IV q24h  Results for orders placed or performed during the hospital encounter of 05/07/19  Blood Culture ID Panel (Reflexed) (Collected: 05/08/2019  1:21 AM)  Result Value Ref Range   Enterococcus species NOT DETECTED NOT DETECTED   Listeria monocytogenes NOT DETECTED NOT DETECTED   Staphylococcus species NOT DETECTED NOT DETECTED   Staphylococcus aureus (BCID) NOT DETECTED NOT DETECTED   Streptococcus species NOT DETECTED NOT DETECTED   Streptococcus agalactiae NOT DETECTED NOT DETECTED   Streptococcus pneumoniae NOT DETECTED NOT DETECTED   Streptococcus pyogenes NOT DETECTED NOT DETECTED   Acinetobacter baumannii NOT DETECTED NOT DETECTED   Enterobacteriaceae species DETECTED (A) NOT DETECTED   Enterobacter cloacae complex NOT DETECTED NOT DETECTED   Escherichia coli DETECTED (A) NOT DETECTED   Klebsiella oxytoca NOT DETECTED NOT DETECTED   Klebsiella pneumoniae NOT DETECTED NOT DETECTED   Proteus species NOT DETECTED NOT DETECTED   Serratia marcescens NOT DETECTED NOT DETECTED   Carbapenem resistance NOT DETECTED NOT DETECTED   Haemophilus influenzae NOT DETECTED NOT DETECTED   Neisseria meningitidis NOT DETECTED NOT DETECTED   Pseudomonas aeruginosa NOT DETECTED NOT DETECTED   Candida albicans NOT DETECTED NOT DETECTED   Candida glabrata NOT DETECTED NOT DETECTED   Candida krusei NOT DETECTED NOT DETECTED   Candida parapsilosis NOT DETECTED NOT DETECTED   Candida tropicalis NOT DETECTED NOT DETECTED     Lynelle Doctor 05/08/2019  5:57 PM

## 2019-05-08 NOTE — Telephone Encounter (Signed)
Dr. Danis, please advise.  

## 2019-05-08 NOTE — Telephone Encounter (Signed)
If this patient is being referred to Dr. Loletha Carrow, please route to the provider. Thank you.

## 2019-05-08 NOTE — Telephone Encounter (Signed)
This patient is currently admitted to the hospital for sepsis and urinary infection.  There appears to be a questionable rectal abnormality on CT scan, a finding stable since a CT scan in January 2017.  Please arrange for this patient to see me at my next available in person clinic visit after she is discharged.

## 2019-05-08 NOTE — ED Notes (Signed)
Rpt called to [facility]

## 2019-05-08 NOTE — Progress Notes (Signed)
PROGRESS NOTE  Destiny Moody  DOB: [birth year]  PCP: Donald Prose, MD [id number]  DOA: 05/07/2019  LOS: 0 days   Brief narrative: Destiny Moody is a 68 y.o. female with PMH of HTN, HLD, T2DM, breast cancer, rheumatoid arthritis, hypothyroidism. Patient presented to the ED on 05/07/2019 alert, no chills and several episodes of vomiting for 5 days. In the ED, patient was afebrile.  She had tenderness in the right lower quadrant and right costovertebral angle area. Urinalysis showed large Allises, positive nitrites, many bacteria and 50 WBC. Blood work showed WBC count elevated to 16, hemoglobin 11.9. CT abdomen pelvis did not find any acute abnormalities. Patient was admitted for sepsis secondary to UTI/pyelonephritis.  Started on IV Rocephin.  Subjective: Patient was seen and examined this morning.  Pleasant elderly Caucasian female.  Lying in bed.  Not in distress.  No new symptoms.  Events from last night noted.  Blood pressure dipped down as low as 80s last night.  Improving now.  Assessment/Plan:  Principal Problem:   Sepsis secondary to UTI [facility]) Active Problems:   GERD   Diabetes mellitus type II, non insulin dependent ([place])   Hypercholesterolemia   History of breast cancer   Hypertension   Hypothyroidism  Sepsis secondary to UTI/early pyelonephritis-present on admission -Clinically he has tenderness in the right lower quadrant and right costovertebral angle area, although no findings on CT scan. -Blood culture/urine culture sent.  IV Rocephin started.  Continue maintenance with normal saline at 100 mL per hour.  Hypertension -PTA, patient was on lisinopril.  Currently on hold because of sepsis.  Continue to monitor blood pressure.    Type 2 diabetes mellitus -PTA, patient was on Victoza, glimepiride metformin, Actos. -Obtain hemoglobin A1c. -Currently blood sugar running in 200s.  She is on sliding scale insulin with Accu-Cheks. -Continue Victoza and Actos.   Metformin glimepiride on hold.  Hyperlipidemia -Continue simvastatin 20 mg daily.  GERD -Continue Pepcid.  History of breast cancer -Continue tamoxifen.  Hypothyroidism -Lately not on levothyroxine.  Check TSH.  Rheumatoid arthritis -Currently not on immunosuppressants.  Rectal mass CT scan abdomen pelvis on admission showed focal rounded soft tissue prominence involving the posterior wall of the rectum which is similar in location to prior exam. Recommend correlation with colonoscopy to exclude underlying polyp/mass. Ordered for a referral to GI as an outpatient.    DVT prophylaxis: Lovenox SQ. Code Status: Full code. Family Communication:  Disposition Plan: Admit for IV antibiotics for 2 to 3 days. Consults called: Admission status: Inpatient/telemetry.   Body mass index is 24.69 kg/m. Mobility: Encourage ambulation Diet: Diabetic diet DVT prophylaxis:  Lovenox subcu Code Status:   Code Status: Full Code  Family Communication:  Expected Discharge:  And to be discharged home in next 2 days  Consultants:  None  Procedures:  None  Antimicrobials: Anti-infectives (From admission, onward)   Start     Dose/Rate Route Frequency Ordered Stop   05/09/19 0000  cefTRIAXone (ROCEPHIN) 1 g in sodium chloride 0.9 % 100 mL IVPB     1 g 200 mL/hr over 30 Minutes Intravenous Every 24 hours 05/08/19 0227     05/08/19 0015  cefTRIAXone (ROCEPHIN) 1 g in sodium chloride 0.9 % 100 mL IVPB     1 g 200 mL/hr over 30 Minutes Intravenous  Once 05/08/19 0000 05/08/19 0158      Infusions:  . sodium chloride    . [START ON 05/09/2019] cefTRIAXone (ROCEPHIN)  IV      Scheduled  Meds: . enoxaparin (LOVENOX) injection  40 mg Subcutaneous Q24H  . ondansetron (ZOFRAN) IV  4 mg Intravenous Once    PRN meds:    Objective: Vitals:   05/08/19 1145 05/08/19 1200  BP:  (!) 139/122  Pulse: (!) 115 (!) 113  Resp: (!) 24 (!) 29  Temp:    SpO2: 99% 97%    Intake/Output  Summary (Last 24 hours) at 05/08/2019 1336 Last data filed at 05/08/2019 0601 Gross per 24 hour  Intake 4034 ml  Output -  Net 4034 ml   Filed Weights   05/07/19 2047  Weight: 61.2 kg   Weight change:  Body mass index is 24.69 kg/m.   Physical Exam: General exam: Appears calm and comfortable.  Skin: No rashes, lesions or ulcers. HEENT: Atraumatic, normocephalic, supple neck, no obvious bleeding Lungs: Clear to auscultation bilaterally CVS: Regular rate and rhythm, no murmur GI/Abd soft, nondistended, tenderness present in right lower quadrant and right flank, bowel sounds present CNS: Alert, awake, oriented x3 Psychiatry: Mood appropriate Extremities: No pedal edema, no calf tenderness  Data Review: I have personally reviewed the laboratory data and studies available.  Recent Labs  Lab 05/07/19 2122 05/08/19 0500  WBC 16.0* 14.6*  NEUTROABS  --  13.0*  HGB 11.9* 10.0*  HCT 35.5* 30.0*  MCV 94.9 96.5  PLT 185 147*   Recent Labs  Lab 05/07/19 2122 05/08/19 0500  NA 134* 139  K 3.9 3.8  CL 102 113*  CO2 22 19*  GLUCOSE 206* 206*  BUN 17 13  CREATININE 0.76 0.69  CALCIUM 9.3 8.0*  MG 1.7  --   PHOS 2.3*  --     Terrilee Croak, MD  [facility] 05/08/2019

## 2019-05-08 NOTE — ED Notes (Signed)
ED TO INPATIENT HANDOFF REPORT  Name/Age/Gender Destiny Moody 68 y.o. female  Code Status    Code Status Orders  (From admission, onward)         Start     Ordered   05/08/19 0229  Full code  Continuous     05/08/19 0231        Code Status History    This patient has a current code status but no historical code status.   Advance Care Planning Activity      Home/SNF/Other Home  Chief Complaint sore throat; fever; abd pain; headache; chills  Level of Care/Admitting Diagnosis ED Disposition    ED Disposition Condition Comment   Admit  Hospital Area: [facility] [id number]  Level of Care: Telemetry [5]  Admit to tele based on following criteria: Monitor for Ischemic changes  Covid Evaluation: Asymptomatic Screening Protocol (No Symptoms)  Diagnosis: Sepsis secondary to UTI [id number]  Admitting Physician: Reubin Milan [id number]  Attending Physician: Reubin Milan [id number]  Estimated length of stay: past midnight tomorrow  Certification:: I certify this patient will need inpatient services for at least 2 midnights  PT Class (Do Not Modify): Inpatient [101]  PT Acc Code (Do Not Modify): Private [1]       Medical History Past Medical History:  Diagnosis Date  . Allergy   . Breast cancer ([place]) 08/24/14   right, upper inner  . Cancer ([place])   . Depression    no meds  . HSV (herpes simplex virus) anogenital infection 2018  . Hypercholesterolemia   . Hypertension   . Hypothyroidism   . Multinodular thyroid   . NIDDM (non-insulin dependent diabetes mellitus)   . Osteoporosis 2020   T score -2.5 stable from prior DEXA  . Radiation 01/31/15-03/01/15   right  breast  . Rheumatoid arthritis(714.0)    lower back  . Wears contact lenses     Allergies Allergies  Allergen Reactions  . Shellfish Allergy Hives    IV Location/Drains/Wounds Patient Lines/Drains/Airways Status   Active Line/Drains/Airways    Name:   Placement  date:   Placement time:   Site:   Days:   Peripheral IV 05/08/19 Left Antecubital   05/08/19    0330    Antecubital   less than 1          Labs/Imaging Results for orders placed or performed during the hospital encounter of 05/07/19 (from the past 48 hour(s))  Urinalysis, Routine w reflex microscopic     Status: Abnormal   Collection Time: 05/07/19  9:05 PM  Result Value Ref Range   Color, Urine YELLOW YELLOW   APPearance HAZY (A) CLEAR   Specific Gravity, Urine 1.010 1.005 - 1.030   pH 6.0 5.0 - 8.0   Glucose, UA NEGATIVE NEGATIVE mg/dL   Hgb urine dipstick SMALL (A) NEGATIVE   Bilirubin Urine NEGATIVE NEGATIVE   Ketones, ur 20 (A) NEGATIVE mg/dL   Protein, ur NEGATIVE NEGATIVE mg/dL   Nitrite POSITIVE (A) NEGATIVE   Leukocytes,Ua LARGE (A) NEGATIVE   RBC / HPF 0-5 0 - 5 RBC/hpf   WBC, UA >50 (H) 0 - 5 WBC/hpf   Bacteria, UA MANY (A) NONE SEEN   Squamous Epithelial / LPF 0-5 0 - 5   WBC Clumps PRESENT    Mucus PRESENT    Hyaline Casts, UA PRESENT     Comment: Performed at [facility], [place] [address], [place], [place] [address]  Lipase, blood  Status: None   Collection Time: 05/07/19  9:22 PM  Result Value Ref Range   Lipase 37 11 - 51 U/L    Comment: Performed at [facility], [place] [address], [place], [place] [address]  Comprehensive metabolic panel     Status: Abnormal   Collection Time: 05/07/19  9:22 PM  Result Value Ref Range   Sodium 134 (L) 135 - 145 mmol/L   Potassium 3.9 3.5 - 5.1 mmol/L   Chloride 102 98 - 111 mmol/L   CO2 22 22 - 32 mmol/L   Glucose, Bld 206 (H) 70 - 99 mg/dL   BUN 17 8 - 23 mg/dL   Creatinine, Ser 0.76 0.44 - 1.00 mg/dL   Calcium 9.3 8.9 - 10.3 mg/dL   Total Protein 7.1 6.5 - 8.1 g/dL   Albumin 3.7 3.5 - 5.0 g/dL   AST 18 15 - 41 U/L   ALT 19 0 - 44 U/L   Alkaline Phosphatase 42 38 - 126 U/L   Total Bilirubin 0.8 0.3 - 1.2 mg/dL   GFR calc non Af Amer >60 >60 mL/min   GFR calc Af Amer >60 >60  mL/min   Anion gap 10 5 - 15    Comment: Performed at [facility], [place] [address], [place], [place] [address]  CBC     Status: Abnormal   Collection Time: 05/07/19  9:22 PM  Result Value Ref Range   WBC 16.0 (H) 4.0 - 10.5 K/uL   RBC 3.74 (L) 3.87 - 5.11 MIL/uL   Hemoglobin 11.9 (L) 12.0 - 15.0 g/dL   HCT 35.5 (L) 36.0 - 46.0 %   MCV 94.9 80.0 - 100.0 fL   MCH 31.8 26.0 - 34.0 pg   MCHC 33.5 30.0 - 36.0 g/dL   RDW 12.8 11.5 - 15.5 %   Platelets 185 150 - 400 K/uL   nRBC 0.0 0.0 - 0.2 %    Comment: Performed at [facility], [place] [address] [place], [place] [address]  Magnesium     Status: None   Collection Time: 05/07/19  9:22 PM  Result Value Ref Range   Magnesium 1.7 1.7 - 2.4 mg/dL    Comment: Performed at [facility], [place] [address], [place], [place] [address]  Phosphorus     Status: Abnormal   Collection Time: 05/07/19  9:22 PM  Result Value Ref Range   Phosphorus 2.3 (L) 2.5 - 4.6 mg/dL    Comment: Performed at [facility], [place] [address] [place], [place] [address]  Lactic acid, plasma     Status: None   Collection Time: 05/07/19 11:07 PM  Result Value Ref Range   Lactic Acid, Venous 1.8 0.5 - 1.9 mmol/L    Comment: Performed at [facility], [place] [address], [place], [place] [address]  Culture, blood (Routine X 2) w Reflex to ID Panel     Status: None (Preliminary result)   Collection Time: 05/08/19  1:21 AM   Specimen: BLOOD  Result Value Ref Range   Specimen Description      BLOOD LEFT WRIST Performed at [facility] [address], [place], [place] [address]    Special Requests      BOTTLES DRAWN AEROBIC AND ANAEROBIC Blood Culture adequate volume Performed at [facility] [address], [place], [place] [address]    Culture      NO GROWTH < 12 HOURS Performed at  [facility], [place] [address], [place], [place]  [address]    Report Status PENDING   Culture, blood (Routine X 2) w Reflex to ID Panel     Status: None (Preliminary result)   Collection Time: 05/08/19  1:26 AM   Specimen: BLOOD  Result Value Ref Range   Specimen Description      BLOOD LEFT ANTECUBITAL Performed at [facility], [place] [address] [place], [place] [address]    Special Requests      BOTTLES DRAWN AEROBIC AND ANAEROBIC Blood Culture results may not be optimal due to an excessive volume of blood received in culture bottles Performed at [facility] [address], [place], [place] [address]    Culture      NO GROWTH < 12 HOURS Performed at [facility] [address] [place], [place] [address]    Report Status PENDING   CBC with Differential     Status: Abnormal   Collection Time: 05/08/19  5:00 AM  Result Value Ref Range   WBC 14.6 (H) 4.0 - 10.5 K/uL   RBC 3.11 (L) 3.87 - 5.11 MIL/uL   Hemoglobin 10.0 (L) 12.0 - 15.0 g/dL   HCT 30.0 (L) 36.0 - 46.0 %   MCV 96.5 80.0 - 100.0 fL   MCH 32.2 26.0 - 34.0 pg   MCHC 33.3 30.0 - 36.0 g/dL   RDW 13.0 11.5 - 15.5 %   Platelets 147 (L) 150 - 400 K/uL   nRBC 0.0 0.0 - 0.2 %   Neutrophils Relative % 88 %   Neutro Abs 13.0 (H) 1.7 - 7.7 K/uL   Lymphocytes Relative 4 %   Lymphs Abs 0.5 (L) 0.7 - 4.0 K/uL   Monocytes Relative 7 %   Monocytes Absolute 1.1 (H) 0.1 - 1.0 K/uL   Eosinophils Relative 0 %   Eosinophils Absolute 0.0 0.0 - 0.5 K/uL   Basophils Relative 0 %   Basophils Absolute 0.0 0.0 - 0.1 K/uL   Immature Granulocytes 1 %   Abs Immature Granulocytes 0.07 0.00 - 0.07 K/uL    Comment: Performed at [facility], [place] [address], [place], [place] [address]  Comprehensive metabolic panel     Status: Abnormal   Collection Time: 05/08/19  5:00 AM  Result Value Ref Range   Sodium 139 135 - 145 mmol/L   Potassium 3.8 3.5 - 5.1 mmol/L   Chloride 113 (H) 98 - 111 mmol/L   CO2 19 (L) 22 - 32 mmol/L    Glucose, Bld 206 (H) 70 - 99 mg/dL   BUN 13 8 - 23 mg/dL   Creatinine, Ser 0.69 0.44 - 1.00 mg/dL   Calcium 8.0 (L) 8.9 - 10.3 mg/dL   Total Protein 5.6 (L) 6.5 - 8.1 g/dL   Albumin 2.9 (L) 3.5 - 5.0 g/dL   AST 16 15 - 41 U/L   ALT 16 0 - 44 U/L   Alkaline Phosphatase 29 (L) 38 - 126 U/L   Total Bilirubin 0.6 0.3 - 1.2 mg/dL   GFR calc non Af Amer >60 >60 mL/min   GFR calc Af Amer >60 >60 mL/min   Anion gap 7 5 - 15    Comment: Performed at [facility], [place] [address] [place],  [address]  Hemoglobin A1c     Status: Abnormal   Collection Time: 05/08/19  5:00 AM  Result Value Ref Range   Hgb A1c MFr Bld  6.8 (H) 4.8 - 5.6 %    Comment: (NOTE) Pre diabetes:          5.7%-6.4% Diabetes:              >6.4% Glycemic control for   <7.0% adults with diabetes    Mean Plasma Glucose 148.46 mg/dL    Comment: Performed at [facility], [place] [address], [place], [place] [address]  CBG monitoring, ED     Status: Abnormal   Collection Time: 05/08/19 12:04 PM  Result Value Ref Range   Glucose-Capillary 296 (H) 70 - 99 mg/dL   Comment 1 Notify RN    Ct Abdomen Pelvis W Contrast  Result Date: 05/08/2019 CLINICAL DATA:  Acute abdominal pain. Bloating and vomiting. EXAM: CT ABDOMEN AND PELVIS WITH CONTRAST TECHNIQUE: Multidetector CT imaging of the abdomen and pelvis was performed using the standard protocol following bolus administration of intravenous contrast. CONTRAST:  156mL OMNIPAQUE IOHEXOL 300 MG/ML  SOLN COMPARISON:  CT 09/28/2015 FINDINGS: Lower chest: Coronary artery calcifications. Heart is normal in size. No focal airspace disease. Hepatobiliary: No focal hepatic abnormality. Clips in the gallbladder fossa postcholecystectomy. No biliary dilatation. Pancreas: No ductal dilatation or inflammation. Spleen: Normal in size without focal abnormality. Adrenals/Urinary Tract: No adrenal nodule. No hydronephrosis or perinephric edema. Homogeneous renal enhancement  with symmetric excretion on delayed phase imaging. Urinary bladder is physiologically distended without wall thickening. Stomach/Bowel: The stomach is nondistended. No small bowel wall thickening, inflammatory change, or obstruction. Normal appendix, series 2, image 53. Small to moderate colonic stool burden. No colonic wall thickening or inflammatory change. There is focal rounded soft tissue prominence involving the posterior wall of the rectum, series 2, image 78, this appears similar in appearance and location of prior exam. Vascular/Lymphatic: Aorta iliac atherosclerosis. No aneurysm. Portal vein is patent. No adenopathy. Reproductive: Uterus and bilateral adnexa are unremarkable. Other: Small fat containing infraumbilical ventral abdominal wall hernia. Small fat containing umbilical hernia. No free air, free fluid, or intra-abdominal fluid collection. Musculoskeletal: Degenerative disc disease and facet arthropathy in the lumbar spine. Degenerative change of both hips with subchondral cystic change in the acetabula. There are no acute or suspicious osseous abnormalities. IMPRESSION: 1. No acute abnormality or explanation for abdominal pain. 2. Focal rounded soft tissue prominence involving the posterior wall of the rectum is similar in location to prior exam. Recommend correlation with colonoscopy to exclude underlying polyp/mass. 3. Small fat containing umbilical and infraumbilical ventral abdominal wall hernias. Aortic Atherosclerosis (ICD10-I70.0). Electronically Signed   By: Keith Rake M.D.   On: 05/08/2019 00:46    Pending Labs Unresulted Labs (From admission, onward)    Start     Ordered   05/15/19 0500  Creatinine, serum  (enoxaparin (LOVENOX)    CrCl >/= 30 ml/min)  Weekly,   R    Comments: while on enoxaparin therapy    05/08/19 0231   05/09/19 0500  HIV antibody (Routine Testing)  Tomorrow morning,   R     05/08/19 0231   05/08/19 1337  TSH  Add-on,   AD     05/08/19 1336    05/08/19 0500  CBC with Differential  Daily,   R     05/08/19 0231   05/08/19 0148  SARS CORONAVIRUS 2 Nasal Swab Aptima Multi Swab  (Asymptomatic/Tier 2 Patients Labs)  Once,   STAT    Question Answer Comment  Is this test for diagnosis or screening Screening   Symptomatic for COVID-19 as defined by  CDC No   Hospitalized for COVID-19 No   Admitted to ICU for COVID-19 No   Previously tested for COVID-19 No   Resident in a congregate (group) care setting No   Employed in healthcare setting No   Pregnant No      05/08/19 0147   05/08/19 0058  Urine culture  ONCE - STAT,   STAT     05/08/19 0057          Vitals/Pain Today's Vitals   05/08/19 1115 05/08/19 1145 05/08/19 1200 05/08/19 1404  BP:   (!) 139/122 (!) 129/99  Pulse: (!) 121 (!) 115 (!) 113 96  Resp:  (!) 24 (!) 29 (!) 24  Temp:      TempSrc:      SpO2: 97% 99% 97% 98%  Weight:      Height:      PainSc:        Isolation Precautions No active isolations  Medications Medications  ondansetron (ZOFRAN) injection 4 mg (4 mg Intravenous Refused 05/07/19 2312)  sodium chloride (PF) 0.9 % injection (has no administration in time range)  cefTRIAXone (ROCEPHIN) 1 g in sodium chloride 0.9 % 100 mL IVPB (has no administration in time range)  enoxaparin (LOVENOX) injection 40 mg (has no administration in time range)  0.9 %  sodium chloride infusion (has no administration in time range)  sodium chloride 0.9 % bolus 1,000 mL (0 mLs Intravenous Stopped 05/07/19 2341)  iohexol (OMNIPAQUE) 300 MG/ML solution 100 mL (100 mLs Intravenous Contrast Given 05/08/19 0008)  cefTRIAXone (ROCEPHIN) 1 g in sodium chloride 0.9 % 100 mL IVPB (0 g Intravenous Stopped 05/08/19 0158)  ketorolac (TORADOL) 30 MG/ML injection 15 mg (15 mg Intravenous Given 05/08/19 0152)  sodium chloride 0.9 % bolus 1,000 mL (0 mLs Intravenous Stopped 05/08/19 0259)  acetaminophen (TYLENOL) tablet 1,000 mg (1,000 mg Oral Given 05/08/19 0130)  ondansetron (ZOFRAN) 4  MG/2ML injection (4 mg  Given 05/08/19 0150)  Melatonin TABS 3 mg (3 mg Oral Given 05/08/19 0316)  sodium chloride 0.9 % bolus 1,000 mL (0 mLs Intravenous Stopped 05/08/19 0418)  magnesium sulfate IVPB 2 g 50 mL (0 g Intravenous Stopped 05/08/19 0601)    Mobility walks

## 2019-05-08 NOTE — H&P (Signed)
History and Physical    Destiny Moody [id number] DOB: 05/20/1951 DOA: 05/07/2019  PCP: Donald Prose, MD   Patient coming from: Home.  I have personally briefly reviewed patient's old medical records in [place]  Chief Complaint: Chills and vomiting.  HPI: Destiny Moody is a 68 y.o. female with medical history significant of seasonal allergies, history of breast cancer, depression, history of HSV 2, hyperlipidemia, hypertension, multinodular thyroid, hypothyroidism, type 2 diabetes, osteoporosis, history of arthritis who is coming to the emergency department due to chills since and several episodes of vomiting since Friday associated with fatigue, frontal headache, malaise and decreased appetite.  She denies abdominal pain, diarrhea, constipation, melena or hematochezia.  Denies dysuria, frequency or hematuria.  No fever, night sweats, rhinorrhea, sore throat, dyspnea, wheezing or hemoptysis.  She denies CP, palpitations, dizziness, diaphoresis, PND, orthopnea or pitting edema of the lower extremities.  No polyuria, polydipsia, polyphagia or blurred vision.  ED Course: Her initial vital signs temperature 98.6 F, pulse 105, respirations 18, blood pressure 105/61 mmHg and O2 sat 97% on room air.  She was given ceftriaxone 1 g IVPB.  A urinalysis small hemoglobinuria, 20 mg/dL ketones, positive nitrates, with large leukocyte esterase.  Microscopic examination showing many bacteria more than 50 WBC per hpf.  CBC shows a white count of 16.0, hemoglobin 11.9 g/dL and platelets 185.  Her CMP shows a sodium 134 mmol/L and a glucose of 206 mg/dL.  The rest of the values are within normal limits.  Lipase was only 37 units/L.  Her lactic acid was normal.  Imaging: her CT abdomen/pelvis did not find any acute abnormalities.  Colonoscopy was recommended and after around the soft tissue prominence involving the posterior wall of the rectum was seen.  There is aortic atherosclerosis.  The small  fat-containing umbilical hernia and an infraumbilical ventral abdominal wall hernia.  Please see images and full radiology report for further detail.  Review of Systems: As per HPI otherwise 10 point review of systems negative.   Past Medical History:  Diagnosis Date   Allergy    Breast cancer ([place]) 08/24/14   right, upper inner   Cancer ([place])    Depression    no meds   HSV (herpes simplex virus) anogenital infection 2018   Hypercholesterolemia    Hypertension    Hypothyroidism    Multinodular thyroid    NIDDM (non-insulin dependent diabetes mellitus)    Osteoporosis 2020   T score -2.5 stable from prior DEXA   Radiation 01/31/15-03/01/15   right  breast   Rheumatoid arthritis(714.0)    lower back   Wears contact lenses     Past Surgical History:  Procedure Laterality Date   BREAST LUMPECTOMY Right 2016   BREAST SURGERY     Lumpectomy   CESAREAN SECTION  [id number]   x 2   CHOLECYSTECTOMY  1993   COLONOSCOPY     greater than 12 years but not sure where colonoscopy was performed   INCISION AND DRAINAGE ABSCESS Right 11/24/2014   Procedure: INCISION AND DRAINAGE RIGHT AXILLA  ABSCESS;  Surgeon: Fanny Skates, MD;  Location: [facility];  Service: General;  Laterality: Right;   RADIOACTIVE SEED GUIDED PARTIAL MASTECTOMY WITH AXILLARY SENTINEL LYMPH NODE BIOPSY Right 10/12/2014   Procedure: RIGHT  PARTIAL MASTECTOMY WITH RADIOACTIVE SEED LOCALIZATION  RIGHT  AXILLARY SENTINEL  NODE BIOPSY;  Surgeon: Fanny Skates, MD;  Location: [facility];  Service: General;  Laterality: Right;   [place]  re annastomosis   WISDOM TOOTH EXTRACTION       reports that she has never smoked. She has never used smokeless tobacco. She reports previous alcohol use. She reports that she does not use drugs.  Allergies  Allergen Reactions   Shellfish Allergy Hives    Family History  Problem Relation Age of Onset   Diabetes Mother        niddm    Heart disease Mother    Heart failure Father    Heart disease Father    Ovarian cancer Cousin 73   Hyperlipidemia Sister    Colon cancer Maternal Uncle 73   Esophageal cancer Neg Hx    Rectal cancer Neg Hx    Stomach cancer Neg Hx    Prior to Admission medications   Medication Sig Start Date End Date Taking? Authorizing Provider  amitriptyline (ELAVIL) 25 MG tablet TAKE ONE TABLET BY MOUTH AT BEDTIME  "OFFICE VISIT NEEDED" 10/18/15  Yes Copland, Gay Filler, MD  aspirin 81 MG tablet Take 81 mg by mouth daily.   Yes [provider]  Calcium Carbonate-Vitamin D 600-200 MG-UNIT TABS Take 1,200 mg by mouth daily.    Yes [provider]  glimepiride (AMARYL) 2 MG tablet TAKE ONE TABLET BY MOUTH ONCE DAILY BEFORE BREAKFAST. 10/18/15  Yes Copland, Gay Filler, MD  Glucosamine 500 MG CAPS Take 1 capsule by mouth daily.   Yes [provider]  ibuprofen (ADVIL,MOTRIN) 200 MG tablet Take 200 mg by mouth every 6 (six) hours as needed for mild pain or moderate pain. Reported on 01/24/2016   Yes [provider]  liraglutide (VICTOZA) 18 MG/3ML SOPN Inject 1.2 mg into the skin daily.   Yes [provider]  lisinopril (PRINIVIL,ZESTRIL) 2.5 MG tablet TAKE ONE TABLET BY MOUTH ONCE DAILY 11/22/16  Yes Copland, Gay Filler, MD  metFORMIN (GLUCOPHAGE-XR) 500 MG 24 hr tablet TAKE TWO TABLETS BY MOUTH TWICE DAILY Patient taking differently: Take 2,000 mg by mouth every morning.  10/28/16  Yes Stallings, Zoe A, MD  Multiple Vitamin (MULTIVITAMIN) capsule Take 1 capsule by mouth daily.   Yes [provider]  Omega-3 Fatty Acids (OMEGA-3 FISH OIL PO) Take by mouth.   Yes [provider]  pioglitazone (ACTOS) 45 MG tablet TAKE ONE TABLET BY MOUTH ONCE DAILY....PATIENT NEEDS OFFICE VISIT FOR REFILLS 10/25/16  Yes Tereasa Coop, PA-C  risedronate (ACTONEL) 150 MG tablet Take 1 tablet (150 mg total) by mouth every 30 (thirty) days. with water on empty stomach,  nothing by mouth or lie down for next 30 minutes. 02/02/19  Yes Fontaine, Belinda Block, MD  simvastatin (ZOCOR) 20 MG tablet Take 1 tablet (20 mg total) by mouth at bedtime. 10/18/15  Yes Copland, Gay Filler, MD  tamoxifen (NOLVADEX) 20 MG tablet Take 1 tablet (20 mg total) by mouth daily. 02/26/19  Yes Magrinat, Virgie Dad, MD  TURMERIC PO Take 1 capsule by mouth daily.   Yes [provider]    Physical Exam: Vitals:   05/07/19 2305 05/08/19 0100 05/08/19 0130 05/08/19 0200  BP: 104/60 (!) 125/99 (!) 186/149 (!) 132/57  Pulse: 86 (!) 107 (!) 159 (!) 131  Resp: 16 18  20   Temp: 98.7 F (37.1 C) 99.6 F (37.6 C)    TempSrc: Oral Oral    SpO2: 99% 100% 98% 96%  Weight:      Height:        Constitutional: Mildly febrile, but otherwise in NAD, calm, comfortable Eyes: PERRL,  lids and conjunctivae normal ENMT: Mucous membranes are mildly dry. Posterior pharynx clear of any exudate or lesions. Neck: normal, supple, no masses, no thyromegaly Respiratory: clear to auscultation bilaterally, no wheezing, no crackles. Normal respiratory effort. No accessory muscle use.  Cardiovascular: Tachycardic at 106 bpm, no murmurs / rubs / gallops. No extremity edema. 2+ pedal pulses. No carotid bruits.  Abdomen: Soft, positive mild RLQ tenderness, positive right CVA tenderness, no guarding or rebound, no masses palpated. No hepatosplenomegaly. Bowel sounds positive.  Musculoskeletal: no clubbing / cyanosis. Good ROM, no contractures. Normal muscle tone.  Skin: no rashes, lesions, ulcers. No induration on limited dermatological examination. Neurologic: CN 2-12 grossly intact. Sensation intact, DTR normal. Strength 5/5 in all 4.  Psychiatric: Normal judgment and insight. Alert and oriented x 3. Normal mood.   Labs on Admission: I have personally reviewed following labs and imaging studies  CBC: Recent Labs  Lab 05/07/19 2122  WBC 16.0*  HGB 11.9*  HCT 35.5*  MCV 94.9  PLT 123XX123   Basic Metabolic  Panel: Recent Labs  Lab 05/07/19 2122  NA 134*  K 3.9  CL 102  CO2 22  GLUCOSE 206*  BUN 17  CREATININE 0.76  CALCIUM 9.3   GFR: Estimated Creatinine Clearance: 59.5 mL/min (by C-G formula based on SCr of 0.76 mg/dL). Liver Function Tests: Recent Labs  Lab 05/07/19 2122  AST 18  ALT 19  ALKPHOS 42  BILITOT 0.8  PROT 7.1  ALBUMIN 3.7   Recent Labs  Lab 05/07/19 2122  LIPASE 37   No results for input(s): AMMONIA in the last 168 hours. Coagulation Profile: No results for input(s): INR, PROTIME in the last 168 hours. Cardiac Enzymes: No results for input(s): CKTOTAL, CKMB, CKMBINDEX, TROPONINI in the last 168 hours. BNP (last 3 results) No results for input(s): PROBNP in the last 8760 hours. HbA1C: No results for input(s): HGBA1C in the last 72 hours. CBG: No results for input(s): GLUCAP in the last 168 hours. Lipid Profile: No results for input(s): CHOL, HDL, LDLCALC, TRIG, CHOLHDL, LDLDIRECT in the last 72 hours. Thyroid Function Tests: No results for input(s): TSH, T4TOTAL, FREET4, T3FREE, THYROIDAB in the last 72 hours. Anemia Panel: No results for input(s): VITAMINB12, FOLATE, FERRITIN, TIBC, IRON, RETICCTPCT in the last 72 hours. Urine analysis:    Component Value Date/Time   COLORURINE YELLOW 05/07/2019 2105   APPEARANCEUR HAZY (A) 05/07/2019 2105   LABSPEC 1.010 05/07/2019 2105   PHURINE 6.0 05/07/2019 2105   GLUCOSEU NEGATIVE 05/07/2019 2105   HGBUR SMALL (A) 05/07/2019 2105   BILIRUBINUR NEGATIVE 05/07/2019 2105   BILIRUBINUR negative 09/27/2015 2103   KETONESUR 20 (A) 05/07/2019 2105   PROTEINUR NEGATIVE 05/07/2019 2105   UROBILINOGEN 0.2 09/27/2015 2103   UROBILINOGEN 0.2 04/19/2008 1937   NITRITE POSITIVE (A) 05/07/2019 2105   LEUKOCYTESUR LARGE (A) 05/07/2019 2105    Radiological Exams on Admission: Ct Abdomen Pelvis W Contrast  Result Date: 05/08/2019 CLINICAL DATA:  Acute abdominal pain. Bloating and vomiting. EXAM: CT ABDOMEN AND  PELVIS WITH CONTRAST TECHNIQUE: Multidetector CT imaging of the abdomen and pelvis was performed using the standard protocol following bolus administration of intravenous contrast. CONTRAST:  143mL OMNIPAQUE IOHEXOL 300 MG/ML  SOLN COMPARISON:  CT 09/28/2015 FINDINGS: Lower chest: Coronary artery calcifications. Heart is normal in size. No focal airspace disease. Hepatobiliary: No focal hepatic abnormality. Clips in the gallbladder fossa postcholecystectomy. No biliary dilatation. Pancreas: No ductal dilatation or inflammation. Spleen: Normal in size without focal abnormality. Adrenals/Urinary Tract:  No adrenal nodule. No hydronephrosis or perinephric edema. Homogeneous renal enhancement with symmetric excretion on delayed phase imaging. Urinary bladder is physiologically distended without wall thickening. Stomach/Bowel: The stomach is nondistended. No small bowel wall thickening, inflammatory change, or obstruction. Normal appendix, series 2, image 53. Small to moderate colonic stool burden. No colonic wall thickening or inflammatory change. There is focal rounded soft tissue prominence involving the posterior wall of the rectum, series 2, image 78, this appears similar in appearance and location of prior exam. Vascular/Lymphatic: Aorta iliac atherosclerosis. No aneurysm. Portal vein is patent. No adenopathy. Reproductive: Uterus and bilateral adnexa are unremarkable. Other: Small fat containing infraumbilical ventral abdominal wall hernia. Small fat containing umbilical hernia. No free air, free fluid, or intra-abdominal fluid collection. Musculoskeletal: Degenerative disc disease and facet arthropathy in the lumbar spine. Degenerative change of both hips with subchondral cystic change in the acetabula. There are no acute or suspicious osseous abnormalities. IMPRESSION: 1. No acute abnormality or explanation for abdominal pain. 2. Focal rounded soft tissue prominence involving the posterior wall of the rectum is  similar in location to prior exam. Recommend correlation with colonoscopy to exclude underlying polyp/mass. 3. Small fat containing umbilical and infraumbilical ventral abdominal wall hernias. Aortic Atherosclerosis (ICD10-I70.0). Electronically Signed   By: Keith Rake M.D.   On: 05/08/2019 00:46    EKG: Independently reviewed. EKG is still pending  Assessment/Plan Principal Problem:   Sepsis secondary to UTI [facility]) Likely early pyelonephritis not seen on CT. Admit to telemetry/inpatient. Continue IV fluids. Continue ceftriaxone 1 g IVPB every 24 hours. Follow-up blood culture and sensitivity. Follow urine culture and sensitivity.  Active Problems:   GERD Oral famotidine while in the hospital.    Diabetes mellitus type II, non insulin dependent (HCC) Carbohydrate modified diet. Continue Victoza 1.2 mg SQ daily. Continue metformin 1000 mg p.o. twice daily. Continue Actos 45 mg p.o. daily. CBG monitoring RI SS while in the hospital.    Hypercholesterolemia Continue simvastatin 20 mg p.o. daily. Monitor LFTs periodically.    History of breast cancer Continue tamoxifen.    Hypertension Hold antihypertensives for now. Monitor blood pressure and heart rate.    Hypothyroidism Per patient, no longer needs levothyroxine. Check TSH level.   DVT prophylaxis: Lovenox SQ. Code Status: Full code. Family Communication:  Disposition Plan: Admit for IV antibiotics for 2 to 3 days. Consults called: Admission status: Inpatient/telemetry.   Reubin Milan MD [facility]  If 7PM-7AM, please contact night-coverage [email or web address]  05/08/2019, 2:35 AM   This document was prepared using Dragon voice recognition software and may contain some unintended transcription errors.

## 2019-05-08 NOTE — Progress Notes (Signed)
NT reported glucose level of 420.  RN text paged the MD for insulin orders.

## 2019-05-09 ENCOUNTER — Inpatient Hospital Stay (HOSPITAL_COMMUNITY): Payer: Medicare Other

## 2019-05-09 DIAGNOSIS — I471 Supraventricular tachycardia: Secondary | ICD-10-CM

## 2019-05-09 LAB — CBC WITH DIFFERENTIAL/PLATELET
Abs Immature Granulocytes: 0.07 10*3/uL (ref 0.00–0.07)
Basophils Absolute: 0 10*3/uL (ref 0.0–0.1)
Basophils Relative: 0 %
Eosinophils Absolute: 0 10*3/uL (ref 0.0–0.5)
Eosinophils Relative: 0 %
HCT: 30.8 % — ABNORMAL LOW (ref 36.0–46.0)
Hemoglobin: 10.2 g/dL — ABNORMAL LOW (ref 12.0–15.0)
Immature Granulocytes: 1 %
Lymphocytes Relative: 5 %
Lymphs Abs: 0.7 10*3/uL (ref 0.7–4.0)
MCH: 31.7 pg (ref 26.0–34.0)
MCHC: 33.1 g/dL (ref 30.0–36.0)
MCV: 95.7 fL (ref 80.0–100.0)
Monocytes Absolute: 1.1 10*3/uL — ABNORMAL HIGH (ref 0.1–1.0)
Monocytes Relative: 9 %
Neutro Abs: 10.5 10*3/uL — ABNORMAL HIGH (ref 1.7–7.7)
Neutrophils Relative %: 85 %
Platelets: 145 10*3/uL — ABNORMAL LOW (ref 150–400)
RBC: 3.22 MIL/uL — ABNORMAL LOW (ref 3.87–5.11)
RDW: 13.1 % (ref 11.5–15.5)
WBC: 12.4 10*3/uL — ABNORMAL HIGH (ref 4.0–10.5)
nRBC: 0 % (ref 0.0–0.2)

## 2019-05-09 LAB — ECHOCARDIOGRAM COMPLETE
Height: 62 in
Weight: 2160 oz

## 2019-05-09 LAB — URINE CULTURE: Culture: >=100000 — AB

## 2019-05-09 LAB — GLUCOSE, CAPILLARY
Glucose-Capillary: 226 mg/dL — ABNORMAL HIGH (ref 70–99)
Glucose-Capillary: 284 mg/dL — ABNORMAL HIGH (ref 70–99)
Glucose-Capillary: 232 mg/dL — ABNORMAL HIGH (ref 70–99)
Glucose-Capillary: 262 mg/dL — ABNORMAL HIGH (ref 70–99)

## 2019-05-09 LAB — TSH: TSH: 0.742 u[IU]/mL (ref 0.350–4.500)

## 2019-05-09 MED ORDER — LEVOFLOXACIN IN D5W 750 MG/150ML IV SOLN
750.0000 mg | INTRAVENOUS | Status: DC
  Administered 2019-05-09 – 2019-05-11 (×3): 750 mg via INTRAVENOUS
  Filled 2019-05-09 (×3): qty 150

## 2019-05-09 MED ORDER — KETOROLAC TROMETHAMINE 15 MG/ML IJ SOLN
15.0000 mg | Freq: Once | INTRAMUSCULAR | Status: AC
  Administered 2019-05-09: 15 mg via INTRAVENOUS
  Filled 2019-05-09: qty 1

## 2019-05-09 MED ORDER — PHENOL 1.4 % MT LIQD
1.0000 | OROMUCOSAL | Status: DC | PRN
  Filled 2019-05-09: qty 177

## 2019-05-09 MED ORDER — IBUPROFEN 200 MG PO TABS
400.0000 mg | ORAL_TABLET | Freq: Once | ORAL | Status: AC
  Administered 2019-05-09: 400 mg via ORAL
  Filled 2019-05-09: qty 2

## 2019-05-09 MED ORDER — POLYETHYLENE GLYCOL 3350 17 G PO PACK
17.0000 g | PACK | Freq: Every day | ORAL | Status: DC
  Administered 2019-05-09 – 2019-05-13 (×5): 17 g via ORAL
  Filled 2019-05-09 (×5): qty 1

## 2019-05-09 MED ORDER — TRAZODONE HCL 50 MG PO TABS
50.0000 mg | ORAL_TABLET | Freq: Once | ORAL | Status: AC
  Administered 2019-05-09: 50 mg via ORAL
  Filled 2019-05-09: qty 1

## 2019-05-09 NOTE — Progress Notes (Signed)
PROGRESS NOTE  Destiny Moody  DOB: [birth year]  PCP: Donald Prose, MD [id number]  DOA: 05/07/2019  LOS: 1 day   Brief narrative: Destiny Moody is a 68 y.o. female with PMH of HTN, HLD, T2DM, breast cancer, rheumatoid arthritis, hypothyroidism. Patient presented to the ED on 05/07/2019 alert, no chills and several episodes of vomiting for 5 days. In the ED, patient was afebrile.  She had tenderness in the right lower quadrant and right costovertebral angle area. Urinalysis showed large Allises, positive nitrites, many bacteria and 50 WBC. Blood work showed WBC count elevated to 16, hemoglobin 11.9. CT abdomen pelvis did not find any acute abnormalities. Patient was admitted for sepsis secondary to UTI/pyelonephritis.  Started on IV Rocephin.  Subjective: Patient was seen and examined this morning.  Pleasant elderly Caucasian female.  Lying in bed.  Not in distress.  Feels better than at presentation. Chart reviewed.  WBC count improving.  Blood culture and urine culture both growing ESBL E. Coli.  Assessment/Plan:  Principal Problem:   Sepsis secondary to UTI [facility]) Active Problems:   GERD   Diabetes mellitus type II, non insulin dependent ([place])   Hypercholesterolemia   History of breast cancer   Hypertension   Hypothyroidism  Sepsis secondary to UTI/early pyelonephritis - POA -Clinically she continues to have tenderness in the right lower quadrant and right costovertebral angle area, although no findings on CT scan.  Clinically monitor. -Blood culture/urine culture sent on admission is growing ESBL E. Coli.  Antibiotic switched to IV Levaquin today.  -Continue maintenance with normal saline at 100 mL per hour. -Continue to monitor WBC count and temperature trend.  Hypertension -PTA, patient was on lisinopril.  Currently on hold because of sepsis.  Continue to monitor blood pressure.    Type 2 diabetes mellitus -HbA1c 6.8 -PTA, patient was on Victoza, glimepiride metformin,  Actos. -Currently blood sugar is running in 200s.  She is on sliding scale insulin with Accu-Cheks. -Continue Victoza and Actos.  Metformin glimepiride on hold.  Hyperlipidemia -Continue simvastatin 20 mg daily.  GERD -Continue Pepcid.  History of breast cancer -Continue tamoxifen.  Hypothyroidism -Lately not on levothyroxine.  Check TSH.  Rheumatoid arthritis -Currently not on immunosuppressants.  Rectal mass CT scan abdomen pelvis on admission showed focal rounded soft tissue prominence involving the posterior wall of the rectum which is similar in location to prior exam. Recommend correlation with colonoscopy to exclude underlying polyp/mass. Ordered for a referral to GI as an outpatient.  Mobility: Encourage ambulation Diet: Diabetic diet DVT prophylaxis:  Lovenox subcu Code Status:   Code Status: Full Code  Family Communication:  Expected Discharge:  Anticipate discharge to home in next 2 days  Consultants:  None  Procedures:  None  Antimicrobials: Anti-infectives (From admission, onward)   Start     Dose/Rate Route Frequency Ordered Stop   05/09/19 1000  levofloxacin (LEVAQUIN) IVPB 750 mg     750 mg 100 mL/hr over 90 Minutes Intravenous Every 24 hours 05/09/19 0913     05/09/19 0000  cefTRIAXone (ROCEPHIN) 1 g in sodium chloride 0.9 % 100 mL IVPB  Status:  Discontinued     1 g 200 mL/hr over 30 Minutes Intravenous Every 24 hours 05/08/19 0227 05/08/19 1803   05/08/19 1830  cefTRIAXone (ROCEPHIN) 2 g in sodium chloride 0.9 % 100 mL IVPB  Status:  Discontinued     2 g 200 mL/hr over 30 Minutes Intravenous Every 24 hours 05/08/19 1803 05/09/19 0913   05/08/19 0015  cefTRIAXone (ROCEPHIN) 1 g in sodium chloride 0.9 % 100 mL IVPB     1 g 200 mL/hr over 30 Minutes Intravenous  Once 05/08/19 0000 05/08/19 0158      Infusions:  . sodium chloride 100 mL/hr at 05/09/19 0552  . levofloxacin (LEVAQUIN) IV 750 mg (05/09/19 [id number])    Scheduled Meds: .  amitriptyline  25 mg Oral QHS  . aspirin EC  81 mg Oral Daily  . enoxaparin (LOVENOX) injection  40 mg Subcutaneous Q24H  . insulin aspart  0-5 Units Subcutaneous QHS  . insulin aspart  0-9 Units Subcutaneous TID WC  . liraglutide  1.2 mg Subcutaneous Daily  . ondansetron (ZOFRAN) IV  4 mg Intravenous Once  . pioglitazone  45 mg Oral Daily  . polyethylene glycol  17 g Oral Daily  . simvastatin  20 mg Oral QHS  . tamoxifen  20 mg Oral Daily    PRN meds:    Objective: Vitals:   05/09/19 0617 05/09/19 1333  BP: 113/60 (!) 118/57  Pulse: 99 94  Resp: 18 18  Temp: 98.6 F (37 C) 98.8 F (37.1 C)  SpO2: 93% 97%    Intake/Output Summary (Last 24 hours) at 05/09/2019 1423 Last data filed at 05/09/2019 1200 Gross per 24 hour  Intake 1914.61 ml  Output 1550 ml  Net 364.61 ml   Filed Weights   05/07/19 2047  Weight: 61.2 kg   Weight change:  Body mass index is 24.69 kg/m.   Physical Exam: General exam: Appears calm and comfortable.  Skin: No rashes, lesions or ulcers. HEENT: Atraumatic, normocephalic, supple neck, no obvious bleeding Lungs: Clear to auscultation bilaterally CVS: Regular rate and rhythm, no murmur GI/Abd soft, nondistended, tenderness present in right lower quadrant and right flank, bowel sounds present CNS: Alert, awake, oriented x3 Psychiatry: Mood appropriate Extremities: No pedal edema, no calf tenderness  Data Review: I have personally reviewed the laboratory data and studies available.  Recent Labs  Lab 05/07/19 2122 05/08/19 0500 05/09/19 0402  WBC 16.0* 14.6* 12.4*  NEUTROABS  --  13.0* 10.5*  HGB 11.9* 10.0* 10.2*  HCT 35.5* 30.0* 30.8*  MCV 94.9 96.5 95.7  PLT 185 147* 145*   Recent Labs  Lab 05/07/19 2122 05/08/19 0500  NA 134* 139  K 3.9 3.8  CL 102 113*  CO2 22 19*  GLUCOSE 206* 206*  BUN 17 13  CREATININE 0.76 0.69  CALCIUM 9.3 8.0*  MG 1.7  --   PHOS 2.3*  --     Terrilee Croak, MD  [facility] 05/09/2019

## 2019-05-09 NOTE — Progress Notes (Signed)
*  PRELIMINARY RESULTS* Echocardiogram 2D Echocardiogram has been performed.  Leavy Cella 05/09/2019, 2:27 PM

## 2019-05-10 DIAGNOSIS — E119 Type 2 diabetes mellitus without complications: Secondary | ICD-10-CM

## 2019-05-10 DIAGNOSIS — Z853 Personal history of malignant neoplasm of breast: Secondary | ICD-10-CM

## 2019-05-10 LAB — GLUCOSE, CAPILLARY
Glucose-Capillary: 186 mg/dL — ABNORMAL HIGH (ref 70–99)
Glucose-Capillary: 278 mg/dL — ABNORMAL HIGH (ref 70–99)
Glucose-Capillary: 206 mg/dL — ABNORMAL HIGH (ref 70–99)
Glucose-Capillary: 185 mg/dL — ABNORMAL HIGH (ref 70–99)

## 2019-05-10 LAB — CBC WITH DIFFERENTIAL/PLATELET
Abs Immature Granulocytes: 0.04 10*3/uL (ref 0.00–0.07)
Basophils Absolute: 0 10*3/uL (ref 0.0–0.1)
Basophils Relative: 0 %
Eosinophils Absolute: 0 10*3/uL (ref 0.0–0.5)
Eosinophils Relative: 0 %
HCT: 31 % — ABNORMAL LOW (ref 36.0–46.0)
Hemoglobin: 10.1 g/dL — ABNORMAL LOW (ref 12.0–15.0)
Immature Granulocytes: 1 %
Lymphocytes Relative: 10 %
Lymphs Abs: 0.6 10*3/uL — ABNORMAL LOW (ref 0.7–4.0)
MCH: 31.1 pg (ref 26.0–34.0)
MCHC: 32.6 g/dL (ref 30.0–36.0)
MCV: 95.4 fL (ref 80.0–100.0)
Monocytes Absolute: 0.5 10*3/uL (ref 0.1–1.0)
Monocytes Relative: 8 %
Neutro Abs: 5 10*3/uL (ref 1.7–7.7)
Neutrophils Relative %: 81 %
Platelets: 133 10*3/uL — ABNORMAL LOW (ref 150–400)
RBC: 3.25 MIL/uL — ABNORMAL LOW (ref 3.87–5.11)
RDW: 13 % (ref 11.5–15.5)
WBC: 6.2 10*3/uL (ref 4.0–10.5)
nRBC: 0 % (ref 0.0–0.2)

## 2019-05-10 LAB — BASIC METABOLIC PANEL
Anion gap: 5 (ref 5–15)
BUN: 8 mg/dL (ref 8–23)
CO2: 21 mmol/L — ABNORMAL LOW (ref 22–32)
Calcium: 7.9 mg/dL — ABNORMAL LOW (ref 8.9–10.3)
Chloride: 113 mmol/L — ABNORMAL HIGH (ref 98–111)
Creatinine, Ser: 0.62 mg/dL (ref 0.44–1.00)
GFR calc Af Amer: >60 mL/min (ref >60)
GFR calc non Af Amer: >60 mL/min (ref >60)
Glucose, Bld: 213 mg/dL — ABNORMAL HIGH (ref 70–99)
Potassium: 3.9 mmol/L (ref 3.5–5.1)
Sodium: 139 mmol/L (ref 135–145)

## 2019-05-10 MED ORDER — INSULIN ASPART 100 UNIT/ML ~~LOC~~ SOLN
0.0000 [IU] | Freq: Three times a day (TID) | SUBCUTANEOUS | Status: DC
  Administered 2019-05-10: 17:00:00 5 [IU] via SUBCUTANEOUS
  Administered 2019-05-11: 3 [IU] via SUBCUTANEOUS
  Administered 2019-05-11: 13:00:00 11 [IU] via SUBCUTANEOUS
  Administered 2019-05-11: 8 [IU] via SUBCUTANEOUS
  Administered 2019-05-12 (×2): 3 [IU] via SUBCUTANEOUS
  Administered 2019-05-12 – 2019-05-13 (×2): 5 [IU] via SUBCUTANEOUS
  Administered 2019-05-13: 11 [IU] via SUBCUTANEOUS

## 2019-05-10 MED ORDER — KETOROLAC TROMETHAMINE 15 MG/ML IJ SOLN: 15.0000 mg | Freq: Three times a day (TID) | INTRAMUSCULAR | Status: AC | PRN

## 2019-05-10 MED ORDER — INSULIN ASPART 100 UNIT/ML ~~LOC~~ SOLN
3.0000 [IU] | Freq: Three times a day (TID) | SUBCUTANEOUS | Status: DC
  Administered 2019-05-10 – 2019-05-11 (×3): 3 [IU] via SUBCUTANEOUS

## 2019-05-10 MED ORDER — ZOLPIDEM TARTRATE 5 MG PO TABS
5.0000 mg | ORAL_TABLET | Freq: Once | ORAL | Status: AC
  Administered 2019-05-10: 5 mg via ORAL
  Filled 2019-05-10: qty 1

## 2019-05-10 NOTE — Progress Notes (Signed)
Pt has continued to have spikes in temps. Blood cultures drawn this morning. MD aware. Resting comfortably at this time.

## 2019-05-10 NOTE — Progress Notes (Signed)
PROGRESS NOTE    Destiny Moody  [id number] DOB: December 28, 1950 DOA: 05/07/2019 PCP: Donald Prose, MD    Brief Narrative:   68 year old lady with prior history of hypertension, type 2 diabetes mellitus, breast cancer, rheumatoid arthritis, hypothyroidism, hyperlipidemia presented to ED on 05/07/2019 with nausea and vomiting for 5 days.  She was found to have sepsis secondary to pyelonephritis.  She was also found to have E. coli bacteremia, E. coli was resistant to cephalosporins and sensitive to fluoroquinolones.  She is currently on IV Levaquin.   Assessment & Plan:   Principal Problem:   Sepsis secondary to UTI [facility]) Active Problems:   GERD   Diabetes mellitus type II, non insulin dependent ([place])   Hypercholesterolemia   History of breast cancer   Hypertension   Hypothyroidism   Sepsis secondary to urinary tract infection/pyelonephritis Present on admission. Patient reports persistent right lower quadrant pain, she also reports her flank pain has much improved.  Urine cultures and blood cultures growing ESBL E. coli and antibiotics were changed to IV Levaquin on 05/09/2019.  Continue with IV fluids in 100 mL/h. Repeat blood cultures ordered. If her abdominal pain does not improve in the next 24 hours recommend to repeat CT abdomen and pelvis with contrast.   Hypertension Blood pressure parameters well controlled this a.m. Continue to hold lisinopril.   History of breast cancer Continue with tamoxifen 20 mg daily.  Anemia chronic disease/normocytic anemia: Baseline hemoglobin appears to be between 11 to 12.  Currently at 67. No signs of bleeding.  Anemia panel will be ordered.  Stool for occult blood ordered.    Hyperlipidemia Continue with Zocor  Rectal mass CT of the abdomen and pelvis on admission showed focal rounded soft tissue prominence involving the posterior wall of the rectum which is similar in location when compared to the CT in 2017 she is scheduled to see  Dr. Loletha Carrow in the office for further evaluation.    Type 2 diabetes mellitus Uncontrolled with hyperglycemia CBG (last 3)  Recent Labs    05/09/19 2130 05/10/19 0725 05/10/19 1159  GLUCAP 262* 186* 278*   Add NovoLog 3 units 3 times daily AC,  change to moderate SSI Continue with Victoza. Hemoglobin A1c is 6.8. Holding Actos, metformin, glimepiride    DVT prophylaxis: (Lovenox) Code Status:  Full code.  Family Communication: none at bedside.  Disposition Plan: pending clinical improvement.    Consultants:   None.   Procedures: Echocardiogram The left ventricle has normal systolic function with an ejection fraction of 60-65%. The cavity size was normal. Left ventricular diastolic Doppler parameters are consistent with pseudonormalization.   2. The right ventricle has normal systolic function. The cavity was normal. There is no increase in right ventricular wall thickness.  Antimicrobials:   levaquin from 05/09/2019  Subjective: Reports not feeling good. No chest pain or sob.  Nauseated earlier this am. No vomiting. Mild right lower quadrant palpation   Objective: Vitals:   05/09/19 2000 05/09/19 2152 05/10/19 0010 05/10/19 0525  BP: (!) 108/46  115/65 124/66  Pulse: (!) 104  85 95  Resp: 20   20  Temp: 100.3 F (37.9 C) (!) 101.6 F (38.7 C) 98.4 F (36.9 C) 99.1 F (37.3 C)  TempSrc: Oral Oral Oral Oral  SpO2: 96%  95% 98%  Weight:      Height:        Intake/Output Summary (Last 24 hours) at 05/10/2019 1402 Last data filed at 05/10/2019 1104 Gross per 24 hour  Intake 150 ml  Output 450 ml  Net -300 ml   Filed Weights   05/07/19 2047  Weight: 61.2 kg    Examination:  General exam: Appears calm and comfortable  Respiratory system: Clear to auscultation. Respiratory effort normal. Cardiovascular system: S1 & S2 heard, RRR.  Gastrointestinal system: Abdomen is soft, tender in the RL QUADRANT, no signs of peritonitis, bowel sounds are good Central  nervous system: Alert and oriented. No focal neurological deficits. Extremities: Symmetric 5 x 5 power. Skin: No rashes, lesions or ulcers Psychiatry:  Mood & affect appropriate.     Data Reviewed: I have personally reviewed following labs and imaging studies  CBC: Recent Labs  Lab 05/07/19 2122 05/08/19 0500 05/09/19 0402 05/10/19 0518  WBC 16.0* 14.6* 12.4* 6.2  NEUTROABS  --  13.0* 10.5* 5.0  HGB 11.9* 10.0* 10.2* 10.1*  HCT 35.5* 30.0* 30.8* 31.0*  MCV 94.9 96.5 95.7 95.4  PLT 185 147* 145* [id number]*   Basic Metabolic Panel: Recent Labs  Lab 05/07/19 2122 05/08/19 0500 05/10/19 0518  NA 134* 139 139  K 3.9 3.8 3.9  CL 102 113* 113*  CO2 22 19* 21*  GLUCOSE 206* 206* 213*  BUN 17 13 8   CREATININE 0.76 0.69 0.62  CALCIUM 9.3 8.0* 7.9*  MG 1.7  --   --   PHOS 2.3*  --   --    GFR: Estimated Creatinine Clearance: 59.5 mL/min (by C-G formula based on SCr of 0.62 mg/dL). Liver Function Tests: Recent Labs  Lab 05/07/19 2122 05/08/19 0500  AST 18 16  ALT 19 16  ALKPHOS 42 29*  BILITOT 0.8 0.6  PROT 7.1 5.6*  ALBUMIN 3.7 2.9*   Recent Labs  Lab 05/07/19 2122  LIPASE 37   No results for input(s): AMMONIA in the last 168 hours. Coagulation Profile: No results for input(s): INR, PROTIME in the last 168 hours. Cardiac Enzymes: No results for input(s): CKTOTAL, CKMB, CKMBINDEX, TROPONINI in the last 168 hours. BNP (last 3 results) No results for input(s): PROBNP in the last 8760 hours. HbA1C: Recent Labs    05/08/19 0500  HGBA1C 6.8*   CBG: Recent Labs  Lab 05/09/19 1146 05/09/19 1629 05/09/19 2130 05/10/19 0725 05/10/19 1159  GLUCAP 284* 232* 262* 186* 278*   Lipid Profile: No results for input(s): CHOL, HDL, LDLCALC, TRIG, CHOLHDL, LDLDIRECT in the last 72 hours. Thyroid Function Tests: Recent Labs    05/09/19 0402  TSH 0.742   Anemia Panel: No results for input(s): VITAMINB12, FOLATE, FERRITIN, TIBC, IRON, RETICCTPCT in the last 72 hours.  Sepsis Labs: Recent Labs  Lab 05/07/19 2307  LATICACIDVEN 1.8    Recent Results (from the past 240 hour(s))  Urine culture     Status: Abnormal   Collection Time: 05/07/19  8:17 PM   Specimen: Urine, Random  Result Value Ref Range Status   Specimen Description   Final    URINE, RANDOM Performed at [facility] [address] [place], [place] [address]    Special Requests   Final    NONE Performed at [facility], [place] [address] [place], [place] [address]    Culture (A)  Final    >=100,000 COLONIES/mL ESCHERICHIA COLI Confirmed Extended Spectrum Beta-Lactamase Producer (ESBL).  In bloodstream infections from ESBL organisms, carbapenems are preferred over piperacillin/tazobactam. They are shown to have a lower risk of mortality.    Report Status 05/09/2019 FINAL  Final   Organism ID, Bacteria ESCHERICHIA COLI (A)  Final      Susceptibility   Escherichia coli - MIC*    AMPICILLIN >=32 RESISTANT Resistant     CEFAZOLIN >=64 RESISTANT Resistant     CEFTRIAXONE >=64 RESISTANT Resistant     CIPROFLOXACIN <=0.25 SENSITIVE Sensitive     GENTAMICIN <=1 SENSITIVE Sensitive     IMIPENEM <=0.25 SENSITIVE Sensitive     NITROFURANTOIN 64 INTERMEDIATE Intermediate     TRIMETH/SULFA >=320 RESISTANT Resistant     AMPICILLIN/SULBACTAM 4 SENSITIVE Sensitive     PIP/TAZO <=4 SENSITIVE Sensitive     Extended ESBL POSITIVE Resistant     * >=100,000 COLONIES/mL ESCHERICHIA COLI  Culture, blood (Routine X 2) w Reflex to ID Panel     Status: Abnormal   Collection Time: 05/08/19  1:21 AM   Specimen: BLOOD  Result Value Ref Range Status   Specimen Description   Final    BLOOD LEFT WRIST Performed at [facility] [address], [place], [place] [address]    Special Requests   Final    BOTTLES DRAWN AEROBIC AND ANAEROBIC Blood Culture adequate volume Performed at [facility] [address], [place], [place]  [address]    Culture  Setup Time   Final    GRAM NEGATIVE RODS IN BOTH AEROBIC AND ANAEROBIC BOTTLES Organism ID to follow CRITICAL RESULT CALLED TO, READ BACK BY AND VERIFIED WITH: A [facility] PHARMD 05/08/19 1754 Performed at [facility], [place] [address] [place], [place] [address]    Culture (A)  Final    ESCHERICHIA COLI Confirmed Extended Spectrum Beta-Lactamase Producer (ESBL).  In bloodstream infections from ESBL organisms, carbapenems are preferred over piperacillin/tazobactam. They are shown to have a lower risk of mortality.    Report Status 05/10/2019 FINAL  Final   Organism ID, Bacteria ESCHERICHIA COLI  Final      Susceptibility   Escherichia coli - MIC*    AMPICILLIN >=32 RESISTANT Resistant     CEFAZOLIN >=64 RESISTANT Resistant     CEFEPIME RESISTANT Resistant     CEFTAZIDIME RESISTANT Resistant     CEFTRIAXONE >=64 RESISTANT Resistant     CIPROFLOXACIN <=0.25 SENSITIVE Sensitive     GENTAMICIN <=1 SENSITIVE Sensitive     IMIPENEM <=0.25 SENSITIVE Sensitive     TRIMETH/SULFA >=320 RESISTANT Resistant     AMPICILLIN/SULBACTAM 4 SENSITIVE Sensitive     PIP/TAZO <=4 SENSITIVE Sensitive     Extended ESBL POSITIVE Resistant     * ESCHERICHIA COLI  Blood Culture ID Panel (Reflexed)     Status: Abnormal   Collection Time: 05/08/19  1:21 AM  Result Value Ref Range Status   Enterococcus species NOT DETECTED NOT DETECTED Final   Listeria monocytogenes NOT DETECTED NOT DETECTED Final   Staphylococcus species NOT DETECTED NOT DETECTED Final   Staphylococcus aureus (BCID) NOT DETECTED NOT DETECTED Final   Streptococcus species NOT DETECTED NOT DETECTED Final   Streptococcus agalactiae NOT DETECTED NOT DETECTED Final   Streptococcus pneumoniae NOT DETECTED NOT DETECTED Final   Streptococcus pyogenes NOT DETECTED NOT DETECTED Final   Acinetobacter baumannii NOT DETECTED NOT DETECTED Final   Enterobacteriaceae species DETECTED (A) NOT DETECTED Final    Comment:  Enterobacteriaceae represent a large family of gram-negative bacteria, not a single organism. CRITICAL RESULT CALLED TO, READ BACK BY AND VERIFIED WITH: A [facility] PHARMD 05/08/19 1754 JDW    Enterobacter cloacae complex NOT DETECTED NOT DETECTED Final   Escherichia coli DETECTED (A) NOT DETECTED Final  Comment: CRITICAL RESULT CALLED TO, READ BACK BY AND VERIFIED WITH: A PHAM PHARMD 05/08/19 1754 JDW    Klebsiella oxytoca NOT DETECTED NOT DETECTED Final   Klebsiella pneumoniae NOT DETECTED NOT DETECTED Final   Proteus species NOT DETECTED NOT DETECTED Final   Serratia marcescens NOT DETECTED NOT DETECTED Final   Carbapenem resistance NOT DETECTED NOT DETECTED Final   Haemophilus influenzae NOT DETECTED NOT DETECTED Final   Neisseria meningitidis NOT DETECTED NOT DETECTED Final   Pseudomonas aeruginosa NOT DETECTED NOT DETECTED Final   Candida albicans NOT DETECTED NOT DETECTED Final   Candida glabrata NOT DETECTED NOT DETECTED Final   Candida krusei NOT DETECTED NOT DETECTED Final   Candida parapsilosis NOT DETECTED NOT DETECTED Final   Candida tropicalis NOT DETECTED NOT DETECTED Final    Comment: Performed at [facility], [place]. [address], [place], [place] [address]  Culture, blood (Routine X 2) w Reflex to ID Panel     Status: Abnormal   Collection Time: 05/08/19  1:26 AM   Specimen: BLOOD  Result Value Ref Range Status   Specimen Description   Final    BLOOD LEFT ANTECUBITAL Performed at [facility] [address], [place], [place] [address]    Special Requests   Final    BOTTLES DRAWN AEROBIC AND ANAEROBIC Blood Culture results may not be optimal due to an excessive volume of blood received in culture bottles Performed at [facility] [address], [place], [place] [address]    Culture  Setup Time   Final    IN BOTH AEROBIC AND ANAEROBIC BOTTLES GRAM NEGATIVE RODS CRITICAL VALUE NOTED.  VALUE IS CONSISTENT WITH PREVIOUSLY REPORTED  AND CALLED VALUE.    Culture (A)  Final    ESCHERICHIA COLI SUSCEPTIBILITIES PERFORMED ON PREVIOUS CULTURE WITHIN THE LAST 5 DAYS. Performed at [facility], [place] [address], [place], [place] [address]    Report Status 05/10/2019 FINAL  Final  SARS CORONAVIRUS 2 Nasal Swab Aptima Multi Swab     Status: None   Collection Time: 05/08/19  1:48 AM   Specimen: Aptima Multi Swab; Nasal Swab  Result Value Ref Range Status   SARS Coronavirus 2 NEGATIVE NEGATIVE Final    Comment: (NOTE) SARS-CoV-2 target nucleic acids are NOT DETECTED. The SARS-CoV-2 RNA is generally detectable in upper and lower respiratory specimens during the acute phase of infection. Negative results do not preclude SARS-CoV-2 infection, do not rule out co-infections with other pathogens, and should not be used as the sole basis for treatment or other patient management decisions. Negative results must be combined with clinical observations, patient history, and epidemiological information. The expected result is Negative. Fact Sheet for Patients: SugarRoll.be Fact Sheet for Healthcare Providers: https://www.woods-mathews.com/ This test is not yet approved or cleared by the [place] FDA and  has been authorized for detection and/or diagnosis of SARS-CoV-2 by FDA under an Emergency Use Authorization (EUA). This EUA will remain  in effect (meaning this test can be used) for the duration of the COVID-19 declaration under Section 56 4(b)(1) of the Act, 21 U.S.C. section 360bbb-3(b)(1), unless the authorization is terminated or revoked sooner. Performed at [facility], [place] [address] [place], [place] [address]          Radiology Studies: No results found.      Scheduled Meds: . amitriptyline  25 mg Oral QHS  . aspirin EC  81 mg Oral Daily  . enoxaparin (LOVENOX)  injection  40 mg Subcutaneous Q24H  . insulin aspart  0-5 Units Subcutaneous QHS  .  insulin aspart  0-9 Units Subcutaneous TID WC  . liraglutide  1.2 mg Subcutaneous Daily  . ondansetron (ZOFRAN) IV  4 mg Intravenous Once  . pioglitazone  45 mg Oral Daily  . polyethylene glycol  17 g Oral Daily  . simvastatin  20 mg Oral QHS  . tamoxifen  20 mg Oral Daily   Continuous Infusions: . sodium chloride 100 mL/hr at 05/10/19 0446  . levofloxacin (LEVAQUIN) IV 750 mg (05/10/19 0926)     LOS: 2 days    Time spent: 38 minutes.    Hosie Poisson, MD [facility] Pager [phone]  If 7PM-7AM, please contact night-coverage [email or web address] Password [email or web address] 05/10/2019, 2:02 PM

## 2019-05-11 DIAGNOSIS — E039 Hypothyroidism, unspecified: Secondary | ICD-10-CM

## 2019-05-11 DIAGNOSIS — E7800 Pure hypercholesterolemia, unspecified: Secondary | ICD-10-CM

## 2019-05-11 DIAGNOSIS — K219 Gastro-esophageal reflux disease without esophagitis: Secondary | ICD-10-CM

## 2019-05-11 LAB — CBC WITH DIFFERENTIAL/PLATELET
Abs Immature Granulocytes: 0.05 10*3/uL (ref 0.00–0.07)
Basophils Absolute: 0 10*3/uL (ref 0.0–0.1)
Basophils Relative: 0 %
Eosinophils Absolute: 0 10*3/uL (ref 0.0–0.5)
Eosinophils Relative: 0 %
HCT: 31.7 % — ABNORMAL LOW (ref 36.0–46.0)
Hemoglobin: 10.5 g/dL — ABNORMAL LOW (ref 12.0–15.0)
Immature Granulocytes: 1 %
Lymphocytes Relative: 16 %
Lymphs Abs: 0.8 10*3/uL (ref 0.7–4.0)
MCH: 31.3 pg (ref 26.0–34.0)
MCHC: 33.1 g/dL (ref 30.0–36.0)
MCV: 94.6 fL (ref 80.0–100.0)
Monocytes Absolute: 0.7 10*3/uL (ref 0.1–1.0)
Monocytes Relative: 13 %
Neutro Abs: 3.4 10*3/uL (ref 1.7–7.7)
Neutrophils Relative %: 70 %
Platelets: 167 10*3/uL (ref 150–400)
RBC: 3.35 MIL/uL — ABNORMAL LOW (ref 3.87–5.11)
RDW: 13.1 % (ref 11.5–15.5)
WBC: 4.9 10*3/uL (ref 4.0–10.5)
nRBC: 0 % (ref 0.0–0.2)

## 2019-05-11 LAB — CULTURE, BLOOD (ROUTINE X 2): Special Requests: ADEQUATE

## 2019-05-11 LAB — IRON AND TIBC
Iron: 26 ug/dL — ABNORMAL LOW (ref 28–170)
Saturation Ratios: 17 % (ref 10.4–31.8)
TIBC: 150 ug/dL — ABNORMAL LOW (ref 250–450)
UIBC: 124 ug/dL

## 2019-05-11 LAB — BASIC METABOLIC PANEL
Anion gap: 8 (ref 5–15)
BUN: 5 mg/dL — ABNORMAL LOW (ref 8–23)
CO2: 18 mmol/L — ABNORMAL LOW (ref 22–32)
Calcium: 7.7 mg/dL — ABNORMAL LOW (ref 8.9–10.3)
Chloride: 111 mmol/L (ref 98–111)
Creatinine, Ser: 0.65 mg/dL (ref 0.44–1.00)
GFR calc Af Amer: >60 mL/min (ref >60)
GFR calc non Af Amer: >60 mL/min (ref >60)
Glucose, Bld: 211 mg/dL — ABNORMAL HIGH (ref 70–99)
Potassium: 3.9 mmol/L (ref 3.5–5.1)
Sodium: 137 mmol/L (ref 135–145)

## 2019-05-11 LAB — HIV ANTIBODY (ROUTINE TESTING W REFLEX): HIV Screen 4th Generation wRfx: NONREACTIVE

## 2019-05-11 LAB — RETICULOCYTES
Immature Retic Fract: 11.4 % (ref 2.3–15.9)
RBC.: 3.35 MIL/uL — ABNORMAL LOW (ref 3.87–5.11)
Retic Count, Absolute: 48.2 10*3/uL (ref 19.0–186.0)
Retic Ct Pct: 1.4 % (ref 0.4–3.1)

## 2019-05-11 LAB — GLUCOSE, CAPILLARY
Glucose-Capillary: 182 mg/dL — ABNORMAL HIGH (ref 70–99)
Glucose-Capillary: 341 mg/dL — ABNORMAL HIGH (ref 70–99)
Glucose-Capillary: 221 mg/dL — ABNORMAL HIGH (ref 70–99)
Glucose-Capillary: 175 mg/dL — ABNORMAL HIGH (ref 70–99)

## 2019-05-11 LAB — VITAMIN B12: Vitamin B-12: 904 pg/mL (ref 180–914)

## 2019-05-11 LAB — FERRITIN: Ferritin: 248 ng/mL (ref 11–307)

## 2019-05-11 LAB — FOLATE: Folate: 18.2 ng/mL (ref >5.9)

## 2019-05-11 MED ORDER — INSULIN ASPART 100 UNIT/ML ~~LOC~~ SOLN
8.0000 [IU] | Freq: Three times a day (TID) | SUBCUTANEOUS | Status: DC
  Administered 2019-05-11 – 2019-05-13 (×6): 8 [IU] via SUBCUTANEOUS

## 2019-05-11 MED ORDER — SODIUM CHLORIDE 0.9 % IV SOLN
1.0000 g | Freq: Three times a day (TID) | INTRAVENOUS | Status: DC
  Administered 2019-05-11 – 2019-05-13 (×7): 1 g via INTRAVENOUS
  Filled 2019-05-11 (×8): qty 1

## 2019-05-11 NOTE — Telephone Encounter (Signed)
No appointments available with Dr. Loletha Carrow. Appointment scheduled with [name].

## 2019-05-11 NOTE — Evaluation (Signed)
Physical Therapy Evaluation Patient Details Name: Destiny Moody MRN: [id number] DOB: 1951-01-31 Today's Date: 05/11/2019   History of Present Illness  68 year old lady with prior history of hypertension, type 2 diabetes mellitus, breast cancer, rheumatoid arthritis, hypothyroidism, hyperlipidemia and admitted for sepsis secondary to UTI  Clinical Impression  Pt admitted with above diagnosis.  Pt currently with functional limitations due to the deficits listed below (see PT Problem List). Pt will benefit from skilled PT to increase their independence and safety with mobility to allow discharge to the venue listed below.  Pt assisted with ambulating in hallway.  Pt reports having RW at home and lives with her spouse.  No f/u PT needs anticipated.     Follow Up Recommendations No PT follow up    Equipment Recommendations  None recommended by PT    Recommendations for Other Services       Precautions / Restrictions Precautions Precautions: None      Mobility  Bed Mobility Overal bed mobility: Needs Assistance Bed Mobility: Supine to Sit     Supine to sit: Supervision;HOB elevated        Transfers Overall transfer level: Needs assistance Equipment used: Rolling walker (2 wheeled) Transfers: Sit to/from Stand Sit to Stand: Min guard         General transfer comment: min/guard for safety  Ambulation/Gait Ambulation/Gait assistance: Min guard Gait Distance (Feet): 200 Feet Assistive device: Rolling walker (2 wheeled) Gait Pattern/deviations: Step-through pattern;Decreased stride length     General Gait Details: ambulated in hallway distance to tolerance, pt reports generalized weakness  Stairs            Wheelchair Mobility    Modified Rankin (Stroke Patients Only)       Balance Overall balance assessment: No apparent balance deficits (not formally assessed)                                           Pertinent Vitals/Pain Pain  Assessment: No/denies pain    Home Living Family/patient expects to be discharged to:: Private residence Living Arrangements: Spouse/significant other           Home Layout: Able to live on main level with bedroom/bathroom Home Equipment: None      Prior Function Level of Independence: Independent               Hand Dominance        Extremity/Trunk Assessment        Lower Extremity Assessment Lower Extremity Assessment: Generalized weakness       Communication   Communication: No difficulties  Cognition Arousal/Alertness: Awake/alert Behavior During Therapy: WFL for tasks assessed/performed Overall Cognitive Status: Within Functional Limits for tasks assessed                                        General Comments      Exercises     Assessment/Plan    PT Assessment Patient needs continued PT services  PT Problem List Decreased strength;Decreased mobility;Decreased activity tolerance;Decreased balance;Decreased knowledge of use of DME       PT Treatment Interventions Gait training;DME instruction;Therapeutic activities;Patient/family education;Functional mobility training;Therapeutic exercise;Balance training;Stair training    PT Goals (Current goals can be found in the Care Plan section)  Acute Rehab PT Goals PT Goal Formulation: With  patient Time For Goal Achievement: 05/25/19 Potential to Achieve Goals: Good    Frequency Min 3X/week   Barriers to discharge        Co-evaluation               AM-PAC PT "6 Clicks" Mobility  Outcome Measure Help needed turning from your back to your side while in a flat bed without using bedrails?: None Help needed moving from lying on your back to sitting on the side of a flat bed without using bedrails?: None Help needed moving to and from a bed to a chair (including a wheelchair)?: None Help needed standing up from a chair using your arms (e.g., wheelchair or bedside chair)?: A  Little Help needed to walk in hospital room?: A Little Help needed climbing 3-5 steps with a railing? : A Little 6 Click Score: 21    End of Session Equipment Utilized During Treatment: Gait belt Activity Tolerance: Patient tolerated treatment well Patient left: in chair;with call bell/phone within reach   PT Visit Diagnosis: Other abnormalities of gait and mobility (R26.89)    Time: [id number] PT Time Calculation (min) (ACUTE ONLY): 14 min   Charges:   PT Evaluation $PT Eval Low Complexity: [place], PT, DPT Acute Rehabilitation Services Office: [phone] Pager: [phone]  Trena Platt 05/11/2019, 12:13 PM

## 2019-05-11 NOTE — Progress Notes (Signed)
PROGRESS NOTE    Destiny Moody  [id number] DOB: [birth year] DOA: 05/07/2019 PCP: Donald Prose, MD   Brief Narrative:  68 year old lady with prior history of hypertension, type 2 diabetes mellitus, breast cancer, rheumatoid arthritis, hypothyroidism, hyperlipidemia presented to ED on 05/07/2019 with nausea and vomiting for 5 days.  She was found to have sepsis secondary to pyelonephritis.  She was also found to have E. coli bacteremia, E. coli was resistant to cephalosporins and sensitive to fluoroquinolones.  She is currently on IV Levaquin.   Assessment & Plan:   Principal Problem:   Sepsis secondary to UTI [facility] [address]) Active Problems:   GERD   Diabetes mellitus type II, non insulin dependent ([place])   Hypercholesterolemia   History of breast cancer   Hypertension   Hypothyroidism   Sepsis secondary to urinary tract infection/pyelonephritis Present on admission. Patient reported persistent right lower quadrant pain, she also reported her flank pain has much improved.  Urine cultures and blood cultures growing ESBL E. coli and antibiotics were changed to IV Levaquin on 05/09/2019.  Continue with IV fluids in 100 mL/h-will add antibiotics to meropenem today Repeat blood cultures negative to date x1 day. Patient did not report any abdominal pain this morning we will hold off on CT of abdomen pelvis  Hypertension Blood pressure parameters well controlled this a.m. Continue to hold lisinopril.   History of breast cancer Continue with tamoxifen 20 mg daily.  Anemia chronic disease/normocytic anemia: Baseline hemoglobin appears to be between 11 to 12.  Currently at 10.5. No signs of bleeding.  F.up anemia panel Stool for occult blood ordered/pending-although hemoglobin stable and rising.    Hyperlipidemia Continue with Zocor  Rectal mass CT of the abdomen and pelvis on admission showed focal rounded soft tissue prominence involving the posterior wall of the rectum which is  similar in location when compared to the CT in 2017 she is scheduled to see Dr. Loletha Carrow in the office for further evaluation.    Type 2 diabetes mellitus Uncontrolled with hyperglycemia CBG (last 3)  Recent Labs (last 2 labs)        Recent Labs    05/09/19 2130 05/10/19 0725 05/10/19 1159  GLUCAP 262* 186* 278*     c/w NovoLog 3 units 3 times daily AC will increase to 8 3 times daily  \ c/w moderate SSI Hemoglobin A1c is 6.8. Holding Actos, metformin, glimepiride   DVT prophylaxis: Lovenox SQ  Code Status: full    Code Status Orders  (From admission, onward)         Start     Ordered   05/08/19 0229  Full code  Continuous     05/08/19 0231        Code Status History    This patient has a current code status but no historical code status.   Advance Care Planning Activity     Family Communication: None today Disposition Plan:   Patient remained inpatient for continued treatment IV antibiotics which have been increased to meropenem, continue to monitor labs and electrolytes, without these treatments patient risk of life-threatening clinical deterioration Consults called: None Admission status: Inpatient   Consultants:   None  Procedures:  Ct Abdomen Pelvis W Contrast  Result Date: 05/08/2019 CLINICAL DATA:  Acute abdominal pain. Bloating and vomiting. EXAM: CT ABDOMEN AND PELVIS WITH CONTRAST TECHNIQUE: Multidetector CT imaging of the abdomen and pelvis was performed using the standard protocol following bolus administration of intravenous contrast. CONTRAST:  130mL OMNIPAQUE IOHEXOL 300 MG/ML  SOLN COMPARISON:  CT 09/28/2015 FINDINGS: Lower chest: Coronary artery calcifications. Heart is normal in size. No focal airspace disease. Hepatobiliary: No focal hepatic abnormality. Clips in the gallbladder fossa postcholecystectomy. No biliary dilatation. Pancreas: No ductal dilatation or inflammation. Spleen: Normal in size without focal abnormality. Adrenals/Urinary  Tract: No adrenal nodule. No hydronephrosis or perinephric edema. Homogeneous renal enhancement with symmetric excretion on delayed phase imaging. Urinary bladder is physiologically distended without wall thickening. Stomach/Bowel: The stomach is nondistended. No small bowel wall thickening, inflammatory change, or obstruction. Normal appendix, series 2, image 53. Small to moderate colonic stool burden. No colonic wall thickening or inflammatory change. There is focal rounded soft tissue prominence involving the posterior wall of the rectum, series 2, image 78, this appears similar in appearance and location of prior exam. Vascular/Lymphatic: Aorta iliac atherosclerosis. No aneurysm. Portal vein is patent. No adenopathy. Reproductive: Uterus and bilateral adnexa are unremarkable. Other: Small fat containing infraumbilical ventral abdominal wall hernia. Small fat containing umbilical hernia. No free air, free fluid, or intra-abdominal fluid collection. Musculoskeletal: Degenerative disc disease and facet arthropathy in the lumbar spine. Degenerative change of both hips with subchondral cystic change in the acetabula. There are no acute or suspicious osseous abnormalities. IMPRESSION: 1. No acute abnormality or explanation for abdominal pain. 2. Focal rounded soft tissue prominence involving the posterior wall of the rectum is similar in location to prior exam. Recommend correlation with colonoscopy to exclude underlying polyp/mass. 3. Small fat containing umbilical and infraumbilical ventral abdominal wall hernias. Aortic Atherosclerosis (ICD10-I70.0). Electronically Signed   By: Keith Rake M.D.   On: 05/08/2019 00:46     Antimicrobials:   Changed to meropenem 8/24   Subjective: Patient reports feeling somewhat better although general lack of energy  Objective: Vitals:   05/11/19 0508 05/11/19 0629 05/11/19 1012 05/11/19 1410  BP: 137/64  120/61 129/63  Pulse: (!) 101  (!) 105 96  Resp: 18   16   Temp: (!) 100.7 F (38.2 C) 100.1 F (37.8 C) 99.5 F (37.5 C) 98.7 F (37.1 C)  TempSrc: Oral Oral Oral Oral  SpO2: 95%   98%  Weight:      Height:        Intake/Output Summary (Last 24 hours) at 05/11/2019 1419 Last data filed at 05/11/2019 1300 Gross per 24 hour  Intake 2998.08 ml  Output 4325 ml  Net -1326.92 ml   Filed Weights   05/07/19 2047  Weight: 61.2 kg    Examination:  General exam: Appears calm and comfortable  Respiratory system: Clear to auscultation. Respiratory effort normal. Cardiovascular system: S1 & S2 heard, RRR. No JVD, murmurs, rubs, gallops or clicks. No pedal edema. Gastrointestinal system: Abdomen is nondistended, soft and nontender. No organomegaly or masses felt. Normal bowel sounds heard. Central nervous system: Alert and oriented. No focal neurological deficits. Extremities: Warm well perfused, no edema. Skin: No rashes, lesions or ulcers Psychiatry: Judgement and insight appear normal. Mood & affect appropriate.     Data Reviewed: I have personally reviewed following labs and imaging studies  CBC: Recent Labs  Lab 05/07/19 2122 05/08/19 0500 05/09/19 0402 05/10/19 0518 05/11/19 0425  WBC 16.0* 14.6* 12.4* 6.2 4.9  NEUTROABS  --  13.0* 10.5* 5.0 3.4  HGB 11.9* 10.0* 10.2* 10.1* 10.5*  HCT 35.5* 30.0* 30.8* 31.0* 31.7*  MCV 94.9 96.5 95.7 95.4 94.6  PLT 185 147* 145* 133* [id number]   Basic Metabolic Panel: Recent Labs  Lab 05/07/19 2122 05/08/19 0500 05/10/19 0518 05/11/19 0425  NA 134* 139 139 137  K 3.9 3.8 3.9 3.9  CL 102 113* 113* 111  CO2 22 19* 21* 18*  GLUCOSE 206* 206* 213* 211*  BUN 17 13 8  5*  CREATININE 0.76 0.69 0.62 0.65  CALCIUM 9.3 8.0* 7.9* 7.7*  MG 1.7  --   --   --   PHOS 2.3*  --   --   --    GFR: Estimated Creatinine Clearance: 59.5 mL/min (by C-G formula based on SCr of 0.65 mg/dL). Liver Function Tests: Recent Labs  Lab 05/07/19 2122 05/08/19 0500  AST 18 16  ALT 19 16  ALKPHOS 42 29*   BILITOT 0.8 0.6  PROT 7.1 5.6*  ALBUMIN 3.7 2.9*   Recent Labs  Lab 05/07/19 2122  LIPASE 37   No results for input(s): AMMONIA in the last 168 hours. Coagulation Profile: No results for input(s): INR, PROTIME in the last 168 hours. Cardiac Enzymes: No results for input(s): CKTOTAL, CKMB, CKMBINDEX, TROPONINI in the last 168 hours. BNP (last 3 results) No results for input(s): PROBNP in the last 8760 hours. HbA1C: No results for input(s): HGBA1C in the last 72 hours. CBG: Recent Labs  Lab 05/10/19 1159 05/10/19 1644 05/10/19 2037 05/11/19 0848 05/11/19 1140  GLUCAP 278* 206* 185* 182* 341*   Lipid Profile: No results for input(s): CHOL, HDL, LDLCALC, TRIG, CHOLHDL, LDLDIRECT in the last 72 hours. Thyroid Function Tests: Recent Labs    05/09/19 0402  TSH 0.742   Anemia Panel: Recent Labs    05/11/19 0425  VITAMINB12 904  FOLATE 18.2  FERRITIN 248  TIBC 150*  IRON 26*  RETICCTPCT 1.4   Sepsis Labs: Recent Labs  Lab 05/07/19 2307  LATICACIDVEN 1.8    Recent Results (from the past 240 hour(s))  Urine culture     Status: Abnormal   Collection Time: 05/07/19  8:17 PM   Specimen: Urine, Random  Result Value Ref Range Status   Specimen Description   Final    URINE, RANDOM Performed at [place] [address] [place], [place] [address]    Special Requests   Final    NONE Performed at [facility], [place] [address], [place],  [address]    Culture (A)  Final    >=100,000 COLONIES/mL ESCHERICHIA COLI Confirmed Extended Spectrum Beta-Lactamase Producer (ESBL).  In bloodstream infections from ESBL organisms, carbapenems are preferred over piperacillin/tazobactam. They are shown to have a lower risk of mortality.    Report Status 05/09/2019 FINAL  Final   Organism ID, Bacteria ESCHERICHIA COLI (A)  Final      Susceptibility   Escherichia coli - MIC*    AMPICILLIN >=32 RESISTANT Resistant     CEFAZOLIN >=64  RESISTANT Resistant     CEFTRIAXONE >=64 RESISTANT Resistant     CIPROFLOXACIN <=0.25 SENSITIVE Sensitive     GENTAMICIN <=1 SENSITIVE Sensitive     IMIPENEM <=0.25 SENSITIVE Sensitive     NITROFURANTOIN 64 INTERMEDIATE Intermediate     TRIMETH/SULFA >=320 RESISTANT Resistant     AMPICILLIN/SULBACTAM 4 SENSITIVE Sensitive     PIP/TAZO <=4 SENSITIVE Sensitive     Extended ESBL POSITIVE Resistant     * >=100,000 COLONIES/mL ESCHERICHIA COLI  Culture, blood (Routine X 2) w Reflex to ID Panel     Status: Abnormal   Collection Time: 05/08/19  1:21 AM   Specimen: BLOOD  Result Value Ref Range Status   Specimen Description   Final    BLOOD  LEFT WRIST Performed at [facility], [place] [address] [place], [place] [address]    Special Requests   Final    BOTTLES DRAWN AEROBIC AND ANAEROBIC Blood Culture adequate volume Performed at [place] [address], [place], [place] [address]    Culture  Setup Time   Final    GRAM NEGATIVE RODS IN BOTH AEROBIC AND ANAEROBIC BOTTLES CRITICAL RESULT CALLED TO, READ BACK BY AND VERIFIED WITH: A [facility] PHARMD 05/08/19 1754 Performed at [facility], [place] [address], [place], [place] [address]    Culture (A)  Final    ESCHERICHIA COLI Confirmed Extended Spectrum Beta-Lactamase Producer (ESBL).  In bloodstream infections from ESBL organisms, carbapenems are preferred over piperacillin/tazobactam. They are shown to have a lower risk of mortality.    Report Status 05/10/2019 FINAL  Final   Organism ID, Bacteria ESCHERICHIA COLI  Final      Susceptibility   Escherichia coli - MIC*    AMPICILLIN >=32 RESISTANT Resistant     CEFAZOLIN >=64 RESISTANT Resistant     CEFEPIME RESISTANT Resistant     CEFTAZIDIME RESISTANT Resistant     CEFTRIAXONE >=64 RESISTANT Resistant     CIPROFLOXACIN <=0.25 SENSITIVE Sensitive     GENTAMICIN <=1 SENSITIVE Sensitive     IMIPENEM <=0.25 SENSITIVE Sensitive     TRIMETH/SULFA  >=320 RESISTANT Resistant     AMPICILLIN/SULBACTAM 4 SENSITIVE Sensitive     PIP/TAZO <=4 SENSITIVE Sensitive     Extended ESBL POSITIVE Resistant     * ESCHERICHIA COLI  Blood Culture ID Panel (Reflexed)     Status: Abnormal   Collection Time: 05/08/19  1:21 AM  Result Value Ref Range Status   Enterococcus species NOT DETECTED NOT DETECTED Final   Listeria monocytogenes NOT DETECTED NOT DETECTED Final   Staphylococcus species NOT DETECTED NOT DETECTED Final   Staphylococcus aureus (BCID) NOT DETECTED NOT DETECTED Final   Streptococcus species NOT DETECTED NOT DETECTED Final   Streptococcus agalactiae NOT DETECTED NOT DETECTED Final   Streptococcus pneumoniae NOT DETECTED NOT DETECTED Final   Streptococcus pyogenes NOT DETECTED NOT DETECTED Final   Acinetobacter baumannii NOT DETECTED NOT DETECTED Final   Enterobacteriaceae species DETECTED (A) NOT DETECTED Final    Comment: Enterobacteriaceae represent a large family of gram-negative bacteria, not a single organism. CRITICAL RESULT CALLED TO, READ BACK BY AND VERIFIED WITH: A [facility] PHARMD 05/08/19 1754 JDW    Enterobacter cloacae complex NOT DETECTED NOT DETECTED Final   Escherichia coli DETECTED (A) NOT DETECTED Final    Comment: CRITICAL RESULT CALLED TO, READ BACK BY AND VERIFIED WITH: A PHAM PHARMD 05/08/19 1754 JDW    Klebsiella oxytoca NOT DETECTED NOT DETECTED Final   Klebsiella pneumoniae NOT DETECTED NOT DETECTED Final   Proteus species NOT DETECTED NOT DETECTED Final   Serratia marcescens NOT DETECTED NOT DETECTED Final   Carbapenem resistance NOT DETECTED NOT DETECTED Final   Haemophilus influenzae NOT DETECTED NOT DETECTED Final   Neisseria meningitidis NOT DETECTED NOT DETECTED Final   Pseudomonas aeruginosa NOT DETECTED NOT DETECTED Final   Candida albicans NOT DETECTED NOT DETECTED Final   Candida glabrata NOT DETECTED NOT DETECTED Final   Candida krusei NOT DETECTED NOT DETECTED Final   Candida parapsilosis NOT  DETECTED NOT DETECTED Final   Candida tropicalis NOT DETECTED NOT DETECTED Final    Comment: Performed at [facility], [place] [address] [place] [address], [place], [place] [address]  Culture, blood (Routine X 2)  w Reflex to ID Panel     Status: Abnormal   Collection Time: 05/08/19  1:26 AM   Specimen: BLOOD  Result Value Ref Range Status   Specimen Description BLOOD LEFT ANTECUBITAL  Final   Special Requests   Final    BOTTLES DRAWN AEROBIC AND ANAEROBIC Blood Culture results may not be optimal due to an excessive volume of blood received in culture bottles   Culture  Setup Time   Final    IN BOTH AEROBIC AND ANAEROBIC BOTTLES GRAM NEGATIVE RODS CRITICAL VALUE NOTED.  VALUE IS CONSISTENT WITH PREVIOUSLY REPORTED AND CALLED VALUE.    Culture (A)  Final    ESCHERICHIA COLI SUSCEPTIBILITIES PERFORMED ON PREVIOUS CULTURE WITHIN THE LAST 5 DAYS.    Report Status 05/10/2019 FINAL  Final  SARS CORONAVIRUS 2 Nasal Swab Aptima Multi Swab     Status: None   Collection Time: 05/08/19  1:48 AM   Specimen: Aptima Multi Swab; Nasal Swab  Result Value Ref Range Status   SARS Coronavirus 2 NEGATIVE NEGATIVE Final    Comment: (NOTE) SARS-CoV-2 target nucleic acids are NOT DETECTED. The SARS-CoV-2 RNA is generally detectable in upper and lower respiratory specimens during the acute phase of infection. Negative results do not preclude SARS-CoV-2 infection, do not rule out co-infections with other pathogens, and should not be used as the sole basis for treatment or other patient management decisions. Negative results must be combined with clinical observations, patient history, and epidemiological information. The expected result is Negative. Fact Sheet for Patients: SugarRoll.be Fact Sheet for Healthcare Providers: https://www.woods-mathews.com/ This test is not yet approved or cleared by the [place] FDA and  has been authorized for detection and/or diagnosis of  SARS-CoV-2 by FDA under an Emergency Use Authorization (EUA). This EUA will remain  in effect (meaning this test can be used) for the duration of the COVID-19 declaration under Section 56 4(b)(1) of the Act, 21 U.S.C. section 360bbb-3(b)(1), unless the authorization is terminated or revoked sooner. Performed at [facility], [place] [address] [place], [place] [address]   Culture, blood (Routine X 2) w Reflex to ID Panel     Status: None (Preliminary result)   Collection Time: 05/10/19  9:44 AM   Specimen: BLOOD  Result Value Ref Range Status   Specimen Description   Final    BLOOD RIGHT ANTECUBITAL Performed at [facility] [address] [place], [place] [address]    Special Requests   Final    BOTTLES DRAWN AEROBIC ONLY Blood Culture adequate volume Performed at [facility] [place] [address], [place], [place] [address]    Culture   Final    NO GROWTH < 24 HOURS Performed at [place] [facility] [address] [place], [place] [address]    Report Status PENDING  Incomplete  Culture, blood (Routine X 2) w Reflex to ID Panel     Status: None (Preliminary result)   Collection Time: 05/10/19  9:44 AM   Specimen: BLOOD  Result Value Ref Range Status   Specimen Description   Final    BLOOD RIGHT ANTECUBITAL Performed at [place] [address] [place], [place] [address]    Special Requests   Final    BOTTLES DRAWN AEROBIC ONLY Blood Culture adequate volume Performed at [place] [address] [place], [place] [address]    Culture   Final    NO GROWTH < 24 HOURS Performed at  [facility], [place] [address] [place], [place] [address]    Report Status PENDING  Incomplete         Radiology Studies: No results found.      Scheduled Meds: . amitriptyline  25 mg Oral QHS  . aspirin EC  81 mg Oral Daily  . enoxaparin (LOVENOX) injection  40 mg Subcutaneous Q24H  . insulin  aspart  0-15 Units Subcutaneous TID WC  . insulin aspart  0-5 Units Subcutaneous QHS  . insulin aspart  3 Units Subcutaneous TID WC  . liraglutide  1.2 mg Subcutaneous Daily  . polyethylene glycol  17 g Oral Daily  . simvastatin  20 mg Oral QHS  . tamoxifen  20 mg Oral Daily   Continuous Infusions: . sodium chloride 100 mL/hr at 05/11/19 0921  . meropenem (MERREM) IV 1 g (05/11/19 1122)     LOS: 3 days    Time spent: 41 min    Nicolette Bang, MD [facility]  If 7PM-7AM, please contact night-coverage  05/11/2019, 2:19 PM

## 2019-05-11 NOTE — Progress Notes (Signed)
Pharmacy Antibiotic Note  Destiny Moody is a 68 y.o. female admitted on 05/07/2019 with pyelonephritis.  Currently on Levaquin, but blood/urine culture + ESBL Ecoli.  Pharmacy has been consulted for Meropenem dosing. 05/11/2019:  Tm 101.61F  Renal function stable at baseline, est CrCl ~52ml/min  Plan: Meropenem 1gm IV q8h No dose adjustments anticipated- pharmacy to sign off.  Please re-consult if needed.   Height: 5\' 2"  (157.5 cm) Weight: 135 lb (61.2 kg) IBW/kg (Calculated) : 50.1  Temp (24hrs), Avg:100.2 F (37.9 C), Min:99.4 F (37.4 C), Max:101.2 F (38.4 C)  Recent Labs  Lab 05/07/19 2122 05/07/19 2307 05/08/19 0500 05/09/19 0402 05/10/19 0518 05/11/19 0425  WBC 16.0*  --  14.6* 12.4* 6.2 4.9  CREATININE 0.76  --  0.69  --  0.62 0.65  LATICACIDVEN  --  1.8  --   --   --   --     Estimated Creatinine Clearance: 59.5 mL/min (by C-G formula based on SCr of 0.65 mg/dL).    Allergies  Allergen Reactions  . Shellfish Allergy Hives    Antimicrobials this admission:  8/21 CTX >> 8/22 8/22 Levaquin >>8/24 8/24 Meropenem>>  Dose adjustments this admission:   Microbiology results:  8/21 BCx x2: Gretta Arab 8/21 UCx: EColi +ESBL  Thank you for allowing pharmacy to be a part of this patient's care.  Biagio Borg 05/11/2019 12:53 PM

## 2019-05-12 ENCOUNTER — Inpatient Hospital Stay: Payer: Self-pay

## 2019-05-12 LAB — CBC WITH DIFFERENTIAL/PLATELET
Abs Immature Granulocytes: 0.08 10*3/uL — ABNORMAL HIGH (ref 0.00–0.07)
Basophils Absolute: 0 10*3/uL (ref 0.0–0.1)
Basophils Relative: 0 %
Eosinophils Absolute: 0.1 10*3/uL (ref 0.0–0.5)
Eosinophils Relative: 1 %
HCT: 31.3 % — ABNORMAL LOW (ref 36.0–46.0)
Hemoglobin: 10.5 g/dL — ABNORMAL LOW (ref 12.0–15.0)
Immature Granulocytes: 1 %
Lymphocytes Relative: 25 %
Lymphs Abs: 1.4 10*3/uL (ref 0.7–4.0)
MCH: 31.5 pg (ref 26.0–34.0)
MCHC: 33.5 g/dL (ref 30.0–36.0)
MCV: 94 fL (ref 80.0–100.0)
Monocytes Absolute: 0.9 10*3/uL (ref 0.1–1.0)
Monocytes Relative: 15 %
Neutro Abs: 3.3 10*3/uL (ref 1.7–7.7)
Neutrophils Relative %: 58 %
Platelets: 202 10*3/uL (ref 150–400)
RBC: 3.33 MIL/uL — ABNORMAL LOW (ref 3.87–5.11)
RDW: 13.2 % (ref 11.5–15.5)
WBC: 5.8 10*3/uL (ref 4.0–10.5)
nRBC: 0 % (ref 0.0–0.2)

## 2019-05-12 LAB — BASIC METABOLIC PANEL
Anion gap: 7 (ref 5–15)
BUN: 7 mg/dL — ABNORMAL LOW (ref 8–23)
CO2: 20 mmol/L — ABNORMAL LOW (ref 22–32)
Calcium: 7.8 mg/dL — ABNORMAL LOW (ref 8.9–10.3)
Chloride: 109 mmol/L (ref 98–111)
Creatinine, Ser: 0.66 mg/dL (ref 0.44–1.00)
GFR calc Af Amer: >60 mL/min (ref >60)
GFR calc non Af Amer: >60 mL/min (ref >60)
Glucose, Bld: 206 mg/dL — ABNORMAL HIGH (ref 70–99)
Potassium: 3.9 mmol/L (ref 3.5–5.1)
Sodium: 136 mmol/L (ref 135–145)

## 2019-05-12 LAB — GLUCOSE, CAPILLARY
Glucose-Capillary: 193 mg/dL — ABNORMAL HIGH (ref 70–99)
Glucose-Capillary: 219 mg/dL — ABNORMAL HIGH (ref 70–99)
Glucose-Capillary: 214 mg/dL — ABNORMAL HIGH (ref 70–99)

## 2019-05-12 MED ORDER — SODIUM CHLORIDE 0.9% FLUSH: 10.0000 mL | Freq: Two times a day (BID) | INTRAVENOUS | Status: DC

## 2019-05-12 MED ORDER — SODIUM CHLORIDE 0.9% FLUSH
10.0000 mL | INTRAVENOUS | Status: DC | PRN
  Administered 2019-05-13: 10 mL
  Filled 2019-05-12: qty 40

## 2019-05-12 NOTE — Plan of Care (Signed)

## 2019-05-12 NOTE — Progress Notes (Signed)
Peripherally Inserted Central Catheter/Midline Placement  The IV Nurse has discussed with the patient and/or persons authorized to consent for the patient, the purpose of this procedure and the potential benefits and risks involved with this procedure.  The benefits include less needle sticks, lab draws from the catheter, and the patient may be discharged home with the catheter. Risks include, but not limited to, infection, bleeding, blood clot (thrombus formation), and puncture of an artery; nerve damage and irregular heartbeat and possibility to perform a PICC exchange if needed/ordered by physician.  Alternatives to this procedure were also discussed.  Bard Power PICC patient education guide, fact sheet on infection prevention and patient information card has been provided to patient /or left at bedside.    PICC/Midline Placement Documentation  PICC Single Lumen [id number] PICC Left Basilic 39 cm 0 cm (Active)  Indication for Insertion or Continuance of Line Home intravenous therapies (PICC only) 05/12/19 1622  Exposed Catheter (cm) 0 cm 05/12/19 1622  Site Assessment Clean;Dry;Intact 05/12/19 1622  Line Status Flushed;Saline locked;Blood return noted 05/12/19 1622  Dressing Type Transparent 05/12/19 1622  Dressing Status Clean;Dry;Intact;Antimicrobial disc in place 05/12/19 1622  Dressing Change Due 05/19/19 05/12/19 1622       Gordan Payment 05/12/2019, 4:24 PM

## 2019-05-12 NOTE — Care Management Important Message (Signed)
Important Message  Patient Details IM Letter given to Nancy Marus RN to present to the Patient Name: Destiny Moody MRN: [id number] Date of Birth: 24-Dec-1950   Medicare Important Message Given:  Yes     Kerin Salen 05/12/2019, 11:34 AM

## 2019-05-12 NOTE — Progress Notes (Signed)
PROGRESS NOTE    Destiny Moody  [id number] DOB: [birth year] DOA: 05/07/2019 PCP: Donald Prose, MD   Brief Narrative:  68 year old lady with prior history of hypertension, type 2 diabetes mellitus, breast cancer, rheumatoid arthritis, hypothyroidism, hyperlipidemia presented to ED on 05/07/2019 with nausea and vomiting for 5 days. She was found to have sepsis secondary to pyelonephritis. She was also found to have E. coli bacteremia,E. coli was resistant to cephalosporins and sensitive to fluoroquinolones. She was on IV Levaquin which I changed to meropenem with pharm recs   Assessment & Plan:   Principal Problem:   Sepsis secondary to UTI [facility]) Active Problems:   GERD   Diabetes mellitus type II, non insulin dependent ([place])   Hypercholesterolemia   History of breast cancer   Hypertension   Hypothyroidism   Sepsis secondary to urinary tract infection/pyelonephritis Present on admission. Patient reported persistent right lower quadrant pain, she also reported her flank pain has much improved. Urine cultures and blood cultures growing ESBL E. coli and antibiotics were changed to IV Levaquin on 05/09/2019. Continue with IV fluids in 100 mL/h-changed antibiotics to meropenem 8/24 Pharm rec 7 day tx PICC ordered Repeat blood cultures negative to date x2 day. Patient did not report any abdominal pain this morning we will hold off on CT of abdomen pelvis  Hypertension Blood pressure parameters well controlled this a.m. Continue to hold lisinopril.  History of breast cancer Continue with tamoxifen 20 mg daily.  Anemia chronic disease/normocytic anemia: Baseline hemoglobin appears to bebetween 11 to 12.  Currently at 10.5. No signs of bleeding.  F.up anemia panel Stool for occult blood ordered/pending-although hemoglobin stable and rising.  Hyperlipidemia Continue with Zocor  Rectal mass CT of the abdomen and pelvis on admission showed focal rounded soft tissue  prominence involving the posterior wall of the rectum which is similar in location when compared to the CT in 2017 she is scheduled to see Dr. Mingo Amber the office for further evaluation.  Type 2 diabetes mellitus Uncontrolled with hyperglycemia c/w NovoLog 8 units 3 times daily AC c/w moderate SSI Hemoglobin A1c is 6.8. Holding Actos, metformin, glimepiride   DVT prophylaxis: Lovenox SQ  Code Status: Full    Code Status Orders  (From admission, onward)         Start     Ordered   05/08/19 0229  Full code  Continuous     05/08/19 0231        Code Status History    This patient has a current code status but no historical code status.   Advance Care Planning Activity     Family Communication: none today  Disposition Plan:   Patient remained inpatient for continued treatment IV antibiotics which have been increased to meropenem, continue to monitor labs and electrolytes, without these treatments patient risk of life-threatening clinical deterioration Consults called: None Admission status: Inpatient   Consultants:   None  Procedures:  Ct Abdomen Pelvis W Contrast  Result Date: 05/08/2019 CLINICAL DATA:  Acute abdominal pain. Bloating and vomiting. EXAM: CT ABDOMEN AND PELVIS WITH CONTRAST TECHNIQUE: Multidetector CT imaging of the abdomen and pelvis was performed using the standard protocol following bolus administration of intravenous contrast. CONTRAST:  165mL OMNIPAQUE IOHEXOL 300 MG/ML  SOLN COMPARISON:  CT 09/28/2015 FINDINGS: Lower chest: Coronary artery calcifications. Heart is normal in size. No focal airspace disease. Hepatobiliary: No focal hepatic abnormality. Clips in the gallbladder fossa postcholecystectomy. No biliary dilatation. Pancreas: No ductal dilatation or inflammation. Spleen: Normal in size  without focal abnormality. Adrenals/Urinary Tract: No adrenal nodule. No hydronephrosis or perinephric edema. Homogeneous renal enhancement with symmetric excretion  on delayed phase imaging. Urinary bladder is physiologically distended without wall thickening. Stomach/Bowel: The stomach is nondistended. No small bowel wall thickening, inflammatory change, or obstruction. Normal appendix, series 2, image 53. Small to moderate colonic stool burden. No colonic wall thickening or inflammatory change. There is focal rounded soft tissue prominence involving the posterior wall of the rectum, series 2, image 78, this appears similar in appearance and location of prior exam. Vascular/Lymphatic: Aorta iliac atherosclerosis. No aneurysm. Portal vein is patent. No adenopathy. Reproductive: Uterus and bilateral adnexa are unremarkable. Other: Small fat containing infraumbilical ventral abdominal wall hernia. Small fat containing umbilical hernia. No free air, free fluid, or intra-abdominal fluid collection. Musculoskeletal: Degenerative disc disease and facet arthropathy in the lumbar spine. Degenerative change of both hips with subchondral cystic change in the acetabula. There are no acute or suspicious osseous abnormalities. IMPRESSION: 1. No acute abnormality or explanation for abdominal pain. 2. Focal rounded soft tissue prominence involving the posterior wall of the rectum is similar in location to prior exam. Recommend correlation with colonoscopy to exclude underlying polyp/mass. 3. Small fat containing umbilical and infraumbilical ventral abdominal wall hernias. Aortic Atherosclerosis (ICD10-I70.0). Electronically Signed   By: Keith Rake M.D.   On: 05/08/2019 00:46   [place] Ekg Site Rite  Result Date: 05/12/2019 If Site Rite image not attached, placement could not be confirmed due to current cardiac rhythm.    Antimicrobials:   Levaquin until 8/24  Meropenem started 824   Subjective: Patient reports feeling somewhat better today   Objective: Vitals:   05/11/19 1012 05/11/19 1410 05/11/19 2137 05/12/19 0426  BP: 120/61 129/63 125/70 131/68  Pulse: (!) 105 96  91 91  Resp:  16 16 17   Temp: 99.5 F (37.5 C) 98.7 F (37.1 C) 98.8 F (37.1 C) 98.7 F (37.1 C)  TempSrc: Oral Oral Oral Oral  SpO2:  98% 97% 99%  Weight:      Height:        Intake/Output Summary (Last 24 hours) at 05/12/2019 1408 Last data filed at 05/12/2019 0900 Gross per 24 hour  Intake 3316.89 ml  Output 700 ml  Net 2616.89 ml   Filed Weights   05/07/19 2047  Weight: 61.2 kg    Examination:  General exam: Appears calm and comfortable  Respiratory system: Clear to auscultation. Respiratory effort normal. Cardiovascular system: S1 & S2 heard, RRR. No JVD, murmurs, rubs, gallops or clicks. No pedal edema. Gastrointestinal system: Abdomen is nondistended, soft and nontender. Central nervous system: Alert and oriented. No focal neurological deficits. Extremities: wwp, no edema. Skin: No rashes, lesions or ulcers Psychiatry: Judgement and insight appear normal. Mood & affect appropriate.     Data Reviewed: I have personally reviewed following labs and imaging studies  CBC: Recent Labs  Lab 05/08/19 0500 05/09/19 0402 05/10/19 0518 05/11/19 0425 05/12/19 0403  WBC 14.6* 12.4* 6.2 4.9 5.8  NEUTROABS 13.0* 10.5* 5.0 3.4 3.3  HGB 10.0* 10.2* 10.1* 10.5* 10.5*  HCT 30.0* 30.8* 31.0* 31.7* 31.3*  MCV 96.5 95.7 95.4 94.6 94.0  PLT 147* 145* 133* 167 [id number]   Basic Metabolic Panel: Recent Labs  Lab 05/07/19 2122 05/08/19 0500 05/10/19 0518 05/11/19 0425 05/12/19 0403  NA 134* 139 139 137 136  K 3.9 3.8 3.9 3.9 3.9  CL 102 113* 113* 111 109  CO2 22 19* 21* 18* 20*  GLUCOSE 206* 206*  213* 211* 206*  BUN 17 13 8  5* 7*  CREATININE 0.76 0.69 0.62 0.65 0.66  CALCIUM 9.3 8.0* 7.9* 7.7* 7.8*  MG 1.7  --   --   --   --   PHOS 2.3*  --   --   --   --    GFR: Estimated Creatinine Clearance: 59.5 mL/min (by C-G formula based on SCr of 0.66 mg/dL). Liver Function Tests: Recent Labs  Lab 05/07/19 2122 05/08/19 0500  AST 18 16  ALT 19 16  ALKPHOS 42 29*   BILITOT 0.8 0.6  PROT 7.1 5.6*  ALBUMIN 3.7 2.9*   Recent Labs  Lab 05/07/19 2122  LIPASE 37   No results for input(s): AMMONIA in the last 168 hours. Coagulation Profile: No results for input(s): INR, PROTIME in the last 168 hours. Cardiac Enzymes: No results for input(s): CKTOTAL, CKMB, CKMBINDEX, TROPONINI in the last 168 hours. BNP (last 3 results) No results for input(s): PROBNP in the last 8760 hours. HbA1C: No results for input(s): HGBA1C in the last 72 hours. CBG: Recent Labs  Lab 05/11/19 1140 05/11/19 1709 05/11/19 2134 05/12/19 0743 05/12/19 1144  GLUCAP 341* 221* 175* 193* 219*   Lipid Profile: No results for input(s): CHOL, HDL, LDLCALC, TRIG, CHOLHDL, LDLDIRECT in the last 72 hours. Thyroid Function Tests: No results for input(s): TSH, T4TOTAL, FREET4, T3FREE, THYROIDAB in the last 72 hours. Anemia Panel: Recent Labs    05/11/19 0425  VITAMINB12 904  FOLATE 18.2  FERRITIN 248  TIBC 150*  IRON 26*  RETICCTPCT 1.4   Sepsis Labs: Recent Labs  Lab 05/07/19 2307  LATICACIDVEN 1.8    Recent Results (from the past 240 hour(s))  Urine culture     Status: Abnormal   Collection Time: 05/07/19  8:17 PM   Specimen: Urine, Random  Result Value Ref Range Status   Specimen Description   Final    URINE, RANDOM Performed at [facility] [address] [place], [place] [address]    Special Requests   Final    NONE Performed at [facility], [place] [address], [place], [place] [address]    Culture (A)  Final    >=100,000 COLONIES/mL ESCHERICHIA COLI Confirmed Extended Spectrum Beta-Lactamase Producer (ESBL).  In bloodstream infections from ESBL organisms, carbapenems are preferred over piperacillin/tazobactam. They are shown to have a lower risk of mortality.    Report Status 05/09/2019 FINAL  Final   Organism ID, Bacteria ESCHERICHIA COLI (A)  Final      Susceptibility   Escherichia coli - MIC*    AMPICILLIN >=32  RESISTANT Resistant     CEFAZOLIN >=64 RESISTANT Resistant     CEFTRIAXONE >=64 RESISTANT Resistant     CIPROFLOXACIN <=0.25 SENSITIVE Sensitive     GENTAMICIN <=1 SENSITIVE Sensitive     IMIPENEM <=0.25 SENSITIVE Sensitive     NITROFURANTOIN 64 INTERMEDIATE Intermediate     TRIMETH/SULFA >=320 RESISTANT Resistant     AMPICILLIN/SULBACTAM 4 SENSITIVE Sensitive     PIP/TAZO <=4 SENSITIVE Sensitive     Extended ESBL POSITIVE Resistant     * >=100,000 COLONIES/mL ESCHERICHIA COLI  Culture, blood (Routine X 2) w Reflex to ID Panel     Status: Abnormal   Collection Time: 05/08/19  1:21 AM   Specimen: BLOOD  Result Value Ref Range Status   Specimen Description   Final    BLOOD LEFT WRIST Performed at [facility] [address], [place], [place] [address]  Special Requests   Final    BOTTLES DRAWN AEROBIC AND ANAEROBIC Blood Culture adequate volume Performed at [facility] [address], [place], [place] [address]    Culture  Setup Time   Final    GRAM NEGATIVE RODS IN BOTH AEROBIC AND ANAEROBIC BOTTLES CRITICAL RESULT CALLED TO, READ BACK BY AND VERIFIED WITH: A [facility] PHARMD 05/08/19 1754 Performed at [facility], [place] [address] [place], [place] [address]    Culture (A)  Final    ESCHERICHIA COLI Confirmed Extended Spectrum Beta-Lactamase Producer (ESBL).  In bloodstream infections from ESBL organisms, carbapenems are preferred over piperacillin/tazobactam. They are shown to have a lower risk of mortality.    Report Status 05/10/2019 FINAL  Final   Organism ID, Bacteria ESCHERICHIA COLI  Final      Susceptibility   Escherichia coli - MIC*    AMPICILLIN >=32 RESISTANT Resistant     CEFAZOLIN >=64 RESISTANT Resistant     CEFEPIME RESISTANT Resistant     CEFTAZIDIME RESISTANT Resistant     CEFTRIAXONE >=64 RESISTANT Resistant     CIPROFLOXACIN <=0.25 SENSITIVE Sensitive     GENTAMICIN <=1 SENSITIVE Sensitive     IMIPENEM <=0.25  SENSITIVE Sensitive     TRIMETH/SULFA >=320 RESISTANT Resistant     AMPICILLIN/SULBACTAM 4 SENSITIVE Sensitive     PIP/TAZO <=4 SENSITIVE Sensitive     Extended ESBL POSITIVE Resistant     * ESCHERICHIA COLI  Blood Culture ID Panel (Reflexed)     Status: Abnormal   Collection Time: 05/08/19  1:21 AM  Result Value Ref Range Status   Enterococcus species NOT DETECTED NOT DETECTED Final   Listeria monocytogenes NOT DETECTED NOT DETECTED Final   Staphylococcus species NOT DETECTED NOT DETECTED Final   Staphylococcus aureus (BCID) NOT DETECTED NOT DETECTED Final   Streptococcus species NOT DETECTED NOT DETECTED Final   Streptococcus agalactiae NOT DETECTED NOT DETECTED Final   Streptococcus pneumoniae NOT DETECTED NOT DETECTED Final   Streptococcus pyogenes NOT DETECTED NOT DETECTED Final   Acinetobacter baumannii NOT DETECTED NOT DETECTED Final   Enterobacteriaceae species DETECTED (A) NOT DETECTED Final    Comment: Enterobacteriaceae represent a large family of gram-negative bacteria, not a single organism. CRITICAL RESULT CALLED TO, READ BACK BY AND VERIFIED WITH: A [facility] PHARMD 05/08/19 1754 JDW    Enterobacter cloacae complex NOT DETECTED NOT DETECTED Final   Escherichia coli DETECTED (A) NOT DETECTED Final    Comment: CRITICAL RESULT CALLED TO, READ BACK BY AND VERIFIED WITH: A PHAM PHARMD 05/08/19 1754 JDW    Klebsiella oxytoca NOT DETECTED NOT DETECTED Final   Klebsiella pneumoniae NOT DETECTED NOT DETECTED Final   Proteus species NOT DETECTED NOT DETECTED Final   Serratia marcescens NOT DETECTED NOT DETECTED Final   Carbapenem resistance NOT DETECTED NOT DETECTED Final   Haemophilus influenzae NOT DETECTED NOT DETECTED Final   Neisseria meningitidis NOT DETECTED NOT DETECTED Final   Pseudomonas aeruginosa NOT DETECTED NOT DETECTED Final   Candida albicans NOT DETECTED NOT DETECTED Final   Candida glabrata NOT DETECTED NOT DETECTED Final   Candida krusei NOT DETECTED NOT  DETECTED Final   Candida parapsilosis NOT DETECTED NOT DETECTED Final   Candida tropicalis NOT DETECTED NOT DETECTED Final    Comment: Performed at [facility], [place] [address] [place], [place] [address]  Culture, blood (Routine X 2) w Reflex to ID Panel     Status: Abnormal   Collection Time: 05/08/19  1:26  AM   Specimen: BLOOD  Result Value Ref Range Status   Specimen Description BLOOD LEFT ANTECUBITAL  Final   Special Requests   Final    BOTTLES DRAWN AEROBIC AND ANAEROBIC Blood Culture results may not be optimal due to an excessive volume of blood received in culture bottles   Culture  Setup Time   Final    IN BOTH AEROBIC AND ANAEROBIC BOTTLES GRAM NEGATIVE RODS CRITICAL VALUE NOTED.  VALUE IS CONSISTENT WITH PREVIOUSLY REPORTED AND CALLED VALUE.    Culture (A)  Final    ESCHERICHIA COLI SUSCEPTIBILITIES PERFORMED ON PREVIOUS CULTURE WITHIN THE LAST 5 DAYS.    Report Status 05/10/2019 FINAL  Final  SARS CORONAVIRUS 2 Nasal Swab Aptima Multi Swab     Status: None   Collection Time: 05/08/19  1:48 AM   Specimen: Aptima Multi Swab; Nasal Swab  Result Value Ref Range Status   SARS Coronavirus 2 NEGATIVE NEGATIVE Final    Comment: (NOTE) SARS-CoV-2 target nucleic acids are NOT DETECTED. The SARS-CoV-2 RNA is generally detectable in upper and lower respiratory specimens during the acute phase of infection. Negative results do not preclude SARS-CoV-2 infection, do not rule out co-infections with other pathogens, and should not be used as the sole basis for treatment or other patient management decisions. Negative results must be combined with clinical observations, patient history, and epidemiological information. The expected result is Negative. Fact Sheet for Patients: SugarRoll.be Fact Sheet for Healthcare Providers: https://www.woods-mathews.com/ This test is not yet approved or cleared by the [place] FDA and  has been  authorized for detection and/or diagnosis of SARS-CoV-2 by FDA under an Emergency Use Authorization (EUA). This EUA will remain  in effect (meaning this test can be used) for the duration of the COVID-19 declaration under Section 56 4(b)(1) of the Act, 21 U.S.C. section 360bbb-3(b)(1), unless the authorization is terminated or revoked sooner. Performed at [facility], [place] [address] [place], [place] [address]   Culture, blood (Routine X 2) w Reflex to ID Panel     Status: None (Preliminary result)   Collection Time: 05/10/19  9:44 AM   Specimen: BLOOD  Result Value Ref Range Status   Specimen Description   Final    BLOOD RIGHT ANTECUBITAL Performed at [facility] [address], [place], [place] [address]    Special Requests   Final    BOTTLES DRAWN AEROBIC ONLY Blood Culture adequate volume Performed at [facility] [address] [place], [place] [address]    Culture   Final    NO GROWTH 2 DAYS Performed at [facility] [address], [place], [place] [address]    Report Status PENDING  Incomplete  Culture, blood (Routine X 2) w Reflex to ID Panel     Status: None (Preliminary result)   Collection Time: 05/10/19  9:44 AM   Specimen: BLOOD  Result Value Ref Range Status   Specimen Description   Final    BLOOD RIGHT ANTECUBITAL Performed at [facility] [address], [place], [place] [address]    Special Requests   Final    BOTTLES DRAWN AEROBIC ONLY Blood Culture adequate volume Performed at [facility] [address], [place], [place] [address]    Culture   Final    NO GROWTH 2 DAYS Performed at [facility] [address], [place], [place] [address]    Report Status PENDING  Incomplete  Radiology Studies: [place] Ekg Site Rite  Result Date: 05/12/2019 If [facility] image not attached, placement could not be confirmed due to current cardiac rhythm.        Scheduled Meds: . amitriptyline  25 mg Oral QHS  . aspirin EC  81 mg Oral Daily  . enoxaparin (LOVENOX) injection  40 mg Subcutaneous Q24H  . insulin aspart  0-15 Units Subcutaneous TID WC  . insulin aspart  0-5 Units Subcutaneous QHS  . insulin aspart  8 Units Subcutaneous TID WC  . liraglutide  1.2 mg Subcutaneous Daily  . polyethylene glycol  17 g Oral Daily  . simvastatin  20 mg Oral QHS  . tamoxifen  20 mg Oral Daily   Continuous Infusions: . sodium chloride 100 mL/hr at 05/12/19 0931  . meropenem (MERREM) IV 1 g (05/12/19 0930)     LOS: 4 days    Time spent: 21 min    Nicolette Bang, MD [facility]  If 7PM-7AM, please contact night-coverage  05/12/2019, 2:08 PM

## 2019-05-13 LAB — GLUCOSE, CAPILLARY
Glucose-Capillary: 215 mg/dL — ABNORMAL HIGH (ref 70–99)
Glucose-Capillary: 325 mg/dL — ABNORMAL HIGH (ref 70–99)
Glucose-Capillary: 105 mg/dL — ABNORMAL HIGH (ref 70–99)

## 2019-05-13 MED ORDER — ERTAPENEM IV (FOR PTA / DISCHARGE USE ONLY): 1.0000 g | INTRAVENOUS | 0 refills | Status: AC

## 2019-05-13 MED ORDER — HEPARIN SOD (PORK) LOCK FLUSH 100 UNIT/ML IV SOLN
250.0000 [IU] | INTRAVENOUS | Status: AC | PRN
  Administered 2019-05-13: 250 [IU]

## 2019-05-13 MED ORDER — SODIUM CHLORIDE 0.9 % IV SOLN: 1.0000 g | Freq: Three times a day (TID) | INTRAVENOUS | Status: DC

## 2019-05-13 MED ORDER — SODIUM CHLORIDE 0.9 % IV SOLN
1.0000 g | INTRAVENOUS | Status: DC
  Administered 2019-05-13: 16:00:00 1000 mg via INTRAVENOUS
  Filled 2019-05-13: qty 1

## 2019-05-13 NOTE — Progress Notes (Signed)
PHARMACY CONSULT NOTE FOR:  OUTPATIENT  PARENTERAL ANTIBIOTIC THERAPY (OPAT)  Indication: ESBL E. Coli bacteremia Regimen: ertapenem 1gm IV q24h End date: 05/17/2019  IV antibiotic discharge orders are pended. To discharging provider:  please sign these orders via discharge navigator,  Select New Orders & click on the button choice - Manage This Unsigned Work.     Thank you for allowing pharmacy to be a part of this patient's care.  Doreene Eland, PharmD, BCPS.   Work Cell: [phone] 05/13/2019 12:10 PM

## 2019-05-13 NOTE — Discharge Summary (Signed)
Physician Discharge Summary  Destiny Moody [id number] DOB: [birth year] DOA: 05/07/2019  PCP: Donald Prose, MD  Admit date: 05/07/2019 Discharge date: 05/13/2019  Admitted From: Home Disposition:  Home  Recommendations for Outpatient Follow-up:  1. Follow up with PCP in 1-2 weeks 2. Complete IV antibiotic through 8/30 dose 3. Routine PICC care. Please d/c PICC after completion of IV antibiotic  Home Health:RN   Discharge Condition:Improved CODE STATUS:Full Diet recommendation: Diabetic   Brief/Interim Summary: 68 year old lady with prior history of hypertension, type 2 diabetes mellitus, breast cancer, rheumatoid arthritis, hypothyroidism, hyperlipidemia presented to ED on 05/07/2019 with nausea and vomiting for 5 days. She was found to have sepsis secondary to pyelonephritis. She was also found to have E. coli bacteremia,E. coli was resistant to cephalosporins and sensitive to fluoroquinolones. She was on IV Levaquin which I changed to meropenem with pharm recs  Discharge Diagnoses:  Principal Problem:   Sepsis secondary to UTI [facility]) Active Problems:   GERD   Diabetes mellitus type II, non insulin dependent ([place])   Hypercholesterolemia   History of breast cancer   Hypertension   Hypothyroidism  Sepsis secondary to urinary tract infection/pyelonephritis Present on admission. Patient reportedpersistent right lower quadrant pain, she also reportedher flank pain has much improved. Urine cultures and blood cultures growing ESBL E. coli and antibiotics were changed to IV Levaquin on 05/09/2019. Continue with IV fluids in 100 mL/h-changed antibiotics to meropenem 8/24 Pharm rec 7 day tx PICC placed 8/25 Repeat blood culturesnegative to date x2 day. Pt to complete course of ertapenem on d/c  Hypertension BP remained stable Continue to hold lisinopril.  History of breast cancer Continue with tamoxifen 20 mg daily.  Anemia chronic disease/normocytic anemia: Baseline  hemoglobin appears to bebetween 11 to 12.  Remained hemodynamically stable  Hyperlipidemia Continue with Zocor  Rectal mass CT of the abdomen and pelvis on admission showed focal rounded soft tissue prominence involving the posterior wall of the rectum which is similar in location when compared to the CT in 2017 she is scheduled to see Dr. Mingo Amber the office for further evaluation.  Type 2 diabetes mellitus Uncontrolled with hyperglycemia c/wNovoLog 8 units 3 times daily AC c/wmoderate SSI while in hospital Hemoglobin A1c is 6.8. Held Actos, metformin, glimepiride while in hospital  Discharge Instructions  Discharge Instructions    Ambulatory referral to Gastroenterology   Complete by: As directed    What is the reason for referral?: Colonoscopy   Home infusion instructions [facility] May follow [place] Dosing Protocol; May administer Cathflo as needed to maintain patency of vascular access device.; Flushing of vascular access device: per [facility] Protocol: 0.9% NaCl pre/post medica...   Complete by: As directed    Instructions: May follow [place] Dosing Protocol   Instructions: May administer Cathflo as needed to maintain patency of vascular access device.   Instructions: Flushing of vascular access device: per [facility] Protocol: 0.9% NaCl pre/post medication administration and prn patency; Heparin 100 u/ml, 56ml for implanted ports and Heparin 10u/ml, 57ml for all other central venous catheters.   Instructions: May follow [facility] Anaphylaxis Protocol for First Dose Administration in the home: 0.9% NaCl at 25-50 ml/hr to maintain IV access for protocol meds. Epinephrine 0.3 ml IV/IM PRN and Benadryl 25-50 IV/IM PRN s/s of anaphylaxis.   Instructions: [place] Infusion Coordinator (RN) to assist per patient IV care needs in the home PRN.     Allergies as of 05/13/2019      Reactions   Shellfish  Allergy Hives      Medication List    STOP taking these  medications   glimepiride 2 MG tablet Commonly known as: AMARYL   lisinopril 2.5 MG tablet Commonly known as: ZESTRIL   pioglitazone 45 MG tablet Commonly known as: ACTOS     TAKE these medications   amitriptyline 25 MG tablet Commonly known as: ELAVIL TAKE ONE TABLET BY MOUTH AT BEDTIME  "OFFICE VISIT NEEDED"   aspirin 81 MG tablet Take 81 mg by mouth daily.   Calcium Carbonate-Vitamin D 600-200 MG-UNIT Tabs Take 1,200 mg by mouth daily.   ertapenem  IVPB Commonly known as: INVANZ Inject 1 g into the vein daily for 4 days. Indication: ESBL E. Coli bacteremia Last Day of Therapy:  05/17/2019 Remove PICC at completion of antibiotic therapy   Glucosamine 500 MG Caps Take 1 capsule by mouth daily.   ibuprofen 200 MG tablet Commonly known as: ADVIL Take 200 mg by mouth every 6 (six) hours as needed for mild pain or moderate pain. Reported on 01/24/2016   metFORMIN 500 MG 24 hr tablet Commonly known as: GLUCOPHAGE-XR TAKE TWO TABLETS BY MOUTH TWICE DAILY What changed:   how much to take  when to take this   multivitamin capsule Take 1 capsule by mouth daily.   OMEGA-3 FISH OIL PO Take by mouth.   risedronate 150 MG tablet Commonly known as: ACTONEL Take 1 tablet (150 mg total) by mouth every 30 (thirty) days. with water on empty stomach, nothing by mouth or lie down for next 30 minutes.   simvastatin 20 MG tablet Commonly known as: ZOCOR Take 1 tablet (20 mg total) by mouth at bedtime.   tamoxifen 20 MG tablet Commonly known as: NOLVADEX Take 1 tablet (20 mg total) by mouth daily.   TURMERIC PO Take 1 capsule by mouth daily.   Victoza 18 MG/3ML Sopn Generic drug: liraglutide Inject 1.2 mg into the skin daily.            Home Infusion Instuctions  (From admission, onward)         Start     Ordered   05/13/19 0000  Home infusion instructions [facility] May follow [place] Dosing Protocol; May administer Cathflo as needed to maintain  patency of vascular access device.; Flushing of vascular access device: per [facility] Protocol: 0.9% NaCl pre/post medica...    Question Answer Comment  Instructions May follow [place] Dosing Protocol   Instructions May administer Cathflo as needed to maintain patency of vascular access device.   Instructions Flushing of vascular access device: per [facility] Protocol: 0.9% NaCl pre/post medication administration and prn patency; Heparin 100 u/ml, 48ml for implanted ports and Heparin 10u/ml, 39ml for all other central venous catheters.   Instructions May follow [facility] Anaphylaxis Protocol for First Dose Administration in the home: 0.9% NaCl at 25-50 ml/hr to maintain IV access for protocol meds. Epinephrine 0.3 ml IV/IM PRN and Benadryl 25-50 IV/IM PRN s/s of anaphylaxis.   Instructions [facility] Infusion Coordinator (RN) to assist per patient IV care needs in the home PRN.      05/13/19 1214         Follow-up Information    Donald Prose, MD. Schedule an appointment as soon as possible for a visit in 1 week(s).   Specialty: Family Medicine Contact information: [place] [phone]          Allergies  Allergen Reactions  .  Shellfish Allergy Hives     Procedures/Studies: Ct Abdomen Pelvis W Contrast  Result Date: 05/08/2019 CLINICAL DATA:  Acute abdominal pain. Bloating and vomiting. EXAM: CT ABDOMEN AND PELVIS WITH CONTRAST TECHNIQUE: Multidetector CT imaging of the abdomen and pelvis was performed using the standard protocol following bolus administration of intravenous contrast. CONTRAST:  118mL OMNIPAQUE IOHEXOL 300 MG/ML  SOLN COMPARISON:  CT 09/28/2015 FINDINGS: Lower chest: Coronary artery calcifications. Heart is normal in size. No focal airspace disease. Hepatobiliary: No focal hepatic abnormality. Clips in the gallbladder fossa postcholecystectomy. No biliary dilatation. Pancreas: No ductal dilatation or inflammation. Spleen: Normal in  size without focal abnormality. Adrenals/Urinary Tract: No adrenal nodule. No hydronephrosis or perinephric edema. Homogeneous renal enhancement with symmetric excretion on delayed phase imaging. Urinary bladder is physiologically distended without wall thickening. Stomach/Bowel: The stomach is nondistended. No small bowel wall thickening, inflammatory change, or obstruction. Normal appendix, series 2, image 53. Small to moderate colonic stool burden. No colonic wall thickening or inflammatory change. There is focal rounded soft tissue prominence involving the posterior wall of the rectum, series 2, image 78, this appears similar in appearance and location of prior exam. Vascular/Lymphatic: Aorta iliac atherosclerosis. No aneurysm. Portal vein is patent. No adenopathy. Reproductive: Uterus and bilateral adnexa are unremarkable. Other: Small fat containing infraumbilical ventral abdominal wall hernia. Small fat containing umbilical hernia. No free air, free fluid, or intra-abdominal fluid collection. Musculoskeletal: Degenerative disc disease and facet arthropathy in the lumbar spine. Degenerative change of both hips with subchondral cystic change in the acetabula. There are no acute or suspicious osseous abnormalities. IMPRESSION: 1. No acute abnormality or explanation for abdominal pain. 2. Focal rounded soft tissue prominence involving the posterior wall of the rectum is similar in location to prior exam. Recommend correlation with colonoscopy to exclude underlying polyp/mass. 3. Small fat containing umbilical and infraumbilical ventral abdominal wall hernias. Aortic Atherosclerosis (ICD10-I70.0). Electronically Signed   By: Keith Rake M.D.   On: 05/08/2019 00:46   [place] Ekg Site Rite  Result Date: 05/12/2019 If Site Rite image not attached, placement could not be confirmed due to current cardiac rhythm.   Subjective: Eager to go home  Discharge Exam: Vitals:   05/13/19 0439 05/13/19 1402  BP:  132/70 115/65  Pulse: 85 86  Resp: 18 18  Temp: 98.2 F (36.8 C) 97.7 F (36.5 C)  SpO2: 98% 99%   Vitals:   05/12/19 0426 05/12/19 2012 05/13/19 0439 05/13/19 1402  BP: 131/68 111/60 132/70 115/65  Pulse: 91 94 85 86  Resp: 17 15 18 18   Temp: 98.7 F (37.1 C) 98.7 F (37.1 C) 98.2 F (36.8 C) 97.7 F (36.5 C)  TempSrc: Oral Oral Oral Oral  SpO2: 99% 97% 98% 99%  Weight:      Height:        General: Pt is alert, awake, not in acute distress Cardiovascular: RRR, S1/S2 +, no rubs, no gallops Respiratory: CTA bilaterally, no wheezing, no rhonchi Abdominal: Soft, NT, ND, bowel sounds + Extremities: no edema, no cyanosis   The results of significant diagnostics from this hospitalization (including imaging, microbiology, ancillary and laboratory) are listed below for reference.     Microbiology: Recent Results (from the past 240 hour(s))  Urine culture     Status: Abnormal   Collection Time: 05/07/19  8:17 PM   Specimen: Urine, Random  Result Value Ref Range Status   Specimen Description   Final    URINE, RANDOM Performed at [facility], [address] [place], [place] [address]    Special Requests   Final    NONE Performed at [facility], [place] [address] [place], [place] [address]    Culture (A)  Final    >=100,000 COLONIES/mL ESCHERICHIA COLI Confirmed Extended Spectrum Beta-Lactamase Producer (ESBL).  In bloodstream infections from ESBL organisms, carbapenems are preferred over piperacillin/tazobactam. They are shown to have a lower risk of mortality.    Report Status 05/09/2019 FINAL  Final   Organism ID, Bacteria ESCHERICHIA COLI (A)  Final      Susceptibility   Escherichia coli - MIC*    AMPICILLIN >=32 RESISTANT Resistant     CEFAZOLIN >=64 RESISTANT Resistant     CEFTRIAXONE >=64 RESISTANT Resistant     CIPROFLOXACIN <=0.25 SENSITIVE Sensitive     GENTAMICIN <=1 SENSITIVE Sensitive     IMIPENEM <=0.25 SENSITIVE  Sensitive     NITROFURANTOIN 64 INTERMEDIATE Intermediate     TRIMETH/SULFA >=320 RESISTANT Resistant     AMPICILLIN/SULBACTAM 4 SENSITIVE Sensitive     PIP/TAZO <=4 SENSITIVE Sensitive     Extended ESBL POSITIVE Resistant     * >=100,000 COLONIES/mL ESCHERICHIA COLI  Culture, blood (Routine X 2) w Reflex to ID Panel     Status: Abnormal   Collection Time: 05/08/19  1:21 AM   Specimen: BLOOD  Result Value Ref Range Status   Specimen Description   Final    BLOOD LEFT WRIST Performed at [facility] [address] [place], [place] [address]    Special Requests   Final    BOTTLES DRAWN AEROBIC AND ANAEROBIC Blood Culture adequate volume Performed at [facility] [address], [place], [place] [address]    Culture  Setup Time   Final    GRAM NEGATIVE RODS IN BOTH AEROBIC AND ANAEROBIC BOTTLES CRITICAL RESULT CALLED TO, READ BACK BY AND VERIFIED WITH: A [facility] PHARMD 05/08/19 1754 Performed at [facility], [place] [address], [place], [place] [address]    Culture (A)  Final    ESCHERICHIA COLI Confirmed Extended Spectrum Beta-Lactamase Producer (ESBL).  In bloodstream infections from ESBL organisms, carbapenems are preferred over piperacillin/tazobactam. They are shown to have a lower risk of mortality.    Report Status 05/10/2019 FINAL  Final   Organism ID, Bacteria ESCHERICHIA COLI  Final      Susceptibility   Escherichia coli - MIC*    AMPICILLIN >=32 RESISTANT Resistant     CEFAZOLIN >=64 RESISTANT Resistant     CEFEPIME RESISTANT Resistant     CEFTAZIDIME RESISTANT Resistant     CEFTRIAXONE >=64 RESISTANT Resistant     CIPROFLOXACIN <=0.25 SENSITIVE Sensitive     GENTAMICIN <=1 SENSITIVE Sensitive     IMIPENEM <=0.25 SENSITIVE Sensitive     TRIMETH/SULFA >=320 RESISTANT Resistant     AMPICILLIN/SULBACTAM 4 SENSITIVE Sensitive     PIP/TAZO <=4 SENSITIVE Sensitive     Extended ESBL POSITIVE Resistant     * ESCHERICHIA COLI  Blood  Culture ID Panel (Reflexed)     Status: Abnormal   Collection Time: 05/08/19  1:21 AM  Result Value Ref Range Status   Enterococcus species NOT DETECTED NOT DETECTED Final   Listeria monocytogenes NOT DETECTED NOT DETECTED Final   Staphylococcus species NOT DETECTED NOT DETECTED Final   Staphylococcus aureus (BCID) NOT DETECTED NOT DETECTED Final   Streptococcus species NOT DETECTED NOT DETECTED Final   Streptococcus agalactiae NOT DETECTED NOT DETECTED Final   Streptococcus  pneumoniae NOT DETECTED NOT DETECTED Final   Streptococcus pyogenes NOT DETECTED NOT DETECTED Final   Acinetobacter baumannii NOT DETECTED NOT DETECTED Final   Enterobacteriaceae species DETECTED (A) NOT DETECTED Final    Comment: Enterobacteriaceae represent a large family of gram-negative bacteria, not a single organism. CRITICAL RESULT CALLED TO, READ BACK BY AND VERIFIED WITH: A [facility] PHARMD 05/08/19 1754 JDW    Enterobacter cloacae complex NOT DETECTED NOT DETECTED Final   Escherichia coli DETECTED (A) NOT DETECTED Final    Comment: CRITICAL RESULT CALLED TO, READ BACK BY AND VERIFIED WITH: A [facility] PHARMD 05/08/19 1754 JDW    Klebsiella oxytoca NOT DETECTED NOT DETECTED Final   Klebsiella pneumoniae NOT DETECTED NOT DETECTED Final   Proteus species NOT DETECTED NOT DETECTED Final   Serratia marcescens NOT DETECTED NOT DETECTED Final   Carbapenem resistance NOT DETECTED NOT DETECTED Final   Haemophilus influenzae NOT DETECTED NOT DETECTED Final   Neisseria meningitidis NOT DETECTED NOT DETECTED Final   Pseudomonas aeruginosa NOT DETECTED NOT DETECTED Final   Candida albicans NOT DETECTED NOT DETECTED Final   Candida glabrata NOT DETECTED NOT DETECTED Final   Candida krusei NOT DETECTED NOT DETECTED Final   Candida parapsilosis NOT DETECTED NOT DETECTED Final   Candida tropicalis NOT DETECTED NOT DETECTED Final    Comment: Performed at [facility], [place] [address] [place], [place] [address]  Culture,  blood (Routine X 2) w Reflex to ID Panel     Status: Abnormal   Collection Time: 05/08/19  1:26 AM   Specimen: BLOOD  Result Value Ref Range Status   Specimen Description BLOOD LEFT ANTECUBITAL  Final   Special Requests   Final    BOTTLES DRAWN AEROBIC AND ANAEROBIC Blood Culture results may not be optimal due to an excessive volume of blood received in culture bottles   Culture  Setup Time   Final    IN BOTH AEROBIC AND ANAEROBIC BOTTLES GRAM NEGATIVE RODS CRITICAL VALUE NOTED.  VALUE IS CONSISTENT WITH PREVIOUSLY REPORTED AND CALLED VALUE.    Culture (A)  Final    ESCHERICHIA COLI SUSCEPTIBILITIES PERFORMED ON PREVIOUS CULTURE WITHIN THE LAST 5 DAYS.    Report Status 05/10/2019 FINAL  Final  SARS CORONAVIRUS 2 Nasal Swab Aptima Multi Swab     Status: None   Collection Time: 05/08/19  1:48 AM   Specimen: Aptima Multi Swab; Nasal Swab  Result Value Ref Range Status   SARS Coronavirus 2 NEGATIVE NEGATIVE Final    Comment: (NOTE) SARS-CoV-2 target nucleic acids are NOT DETECTED. The SARS-CoV-2 RNA is generally detectable in upper and lower respiratory specimens during the acute phase of infection. Negative results do not preclude SARS-CoV-2 infection, do not rule out co-infections with other pathogens, and should not be used as the sole basis for treatment or other patient management decisions. Negative results must be combined with clinical observations, patient history, and epidemiological information. The expected result is Negative. Fact Sheet for Patients: SugarRoll.be Fact Sheet for Healthcare Providers: https://www.woods-mathews.com/ This test is not yet approved or cleared by the [place] FDA and  has been authorized for detection and/or diagnosis of SARS-CoV-2 by FDA under an Emergency Use Authorization (EUA). This EUA will remain  in effect (meaning this test can be used) for the duration of the COVID-19 declaration under  Section 56 4(b)(1) of the Act, 21 U.S.C. section 360bbb-3(b)(1), unless the authorization is terminated or revoked sooner. Performed at [facility], [place] [address]  [place], [place] [address]   Culture, blood (Routine X 2) w Reflex to ID Panel     Status: None (Preliminary result)   Collection Time: 05/10/19  9:44 AM   Specimen: BLOOD  Result Value Ref Range Status   Specimen Description   Final    BLOOD RIGHT ANTECUBITAL Performed at [facility] [address] [place], [place] [address]    Special Requests   Final    BOTTLES DRAWN AEROBIC ONLY Blood Culture adequate volume Performed at [place] [address] [place], [place] [address]    Culture   Final    NO GROWTH 3 DAYS Performed at [facility], [place] [address] [place], [place] [address]    Report Status PENDING  Incomplete  Culture, blood (Routine X 2) w Reflex to ID Panel     Status: None (Preliminary result)   Collection Time: 05/10/19  9:44 AM   Specimen: BLOOD  Result Value Ref Range Status   Specimen Description   Final    BLOOD RIGHT ANTECUBITAL Performed at [facility] [address], [place], [place] [address]    Special Requests   Final    BOTTLES DRAWN AEROBIC ONLY Blood Culture adequate volume Performed at [facility] [address] [place], [place] [address]    Culture   Final    NO GROWTH 3 DAYS Performed at [facility], [place] [address], [place], [place] [address]    Report Status PENDING  Incomplete     Labs: BNP (last 3 results) No results for input(s): BNP in the last 8760 hours. Basic Metabolic Panel: Recent Labs  Lab 05/07/19 2122 05/08/19 0500 05/10/19 0518 05/11/19 0425 05/12/19 0403  NA 134* 139 139 137 136  K 3.9 3.8 3.9 3.9 3.9  CL 102 113* 113* 111 109  CO2 22 19* 21* 18* 20*  GLUCOSE 206* 206* 213* 211* 206*  BUN 17 13 8  5* 7*  CREATININE 0.76 0.69 0.62 0.65 0.66   CALCIUM 9.3 8.0* 7.9* 7.7* 7.8*  MG 1.7  --   --   --   --   PHOS 2.3*  --   --   --   --    Liver Function Tests: Recent Labs  Lab 05/07/19 2122 05/08/19 0500  AST 18 16  ALT 19 16  ALKPHOS 42 29*  BILITOT 0.8 0.6  PROT 7.1 5.6*  ALBUMIN 3.7 2.9*   Recent Labs  Lab 05/07/19 2122  LIPASE 37   No results for input(s): AMMONIA in the last 168 hours. CBC: Recent Labs  Lab 05/08/19 0500 05/09/19 0402 05/10/19 0518 05/11/19 0425 05/12/19 0403  WBC 14.6* 12.4* 6.2 4.9 5.8  NEUTROABS 13.0* 10.5* 5.0 3.4 3.3  HGB 10.0* 10.2* 10.1* 10.5* 10.5*  HCT 30.0* 30.8* 31.0* 31.7* 31.3*  MCV 96.5 95.7 95.4 94.6 94.0  PLT 147* 145* 133* 167 202   Cardiac Enzymes: No results for input(s): CKTOTAL, CKMB, CKMBINDEX, TROPONINI in the last 168 hours. BNP: Invalid input(s): POCBNP CBG: Recent Labs  Lab 05/12/19 0743 05/12/19 1144 05/12/19 2015 05/13/19 0752 05/13/19 1200  GLUCAP 193* 219* 214* 215* 325*   D-Dimer No results for input(s): DDIMER in the last 72 hours. Hgb A1c No results for input(s): HGBA1C in the last 72 hours. Lipid Profile No results for input(s): CHOL, HDL, LDLCALC, TRIG, CHOLHDL, LDLDIRECT in the last 72 hours. Thyroid function studies No results for input(s): TSH, T4TOTAL, T3FREE, THYROIDAB  in the last 72 hours.  Invalid input(s): FREET3 Anemia work up Recent Labs    05/11/19 0425  VITAMINB12 904  FOLATE 18.2  FERRITIN 248  TIBC 150*  IRON 26*  RETICCTPCT 1.4   Urinalysis    Component Value Date/Time   COLORURINE YELLOW 05/07/2019 2105   APPEARANCEUR HAZY (A) 05/07/2019 2105   LABSPEC 1.010 05/07/2019 2105   PHURINE 6.0 05/07/2019 2105   GLUCOSEU NEGATIVE 05/07/2019 2105   HGBUR SMALL (A) 05/07/2019 2105   BILIRUBINUR NEGATIVE 05/07/2019 2105   BILIRUBINUR negative 09/27/2015 2103   KETONESUR 20 (A) 05/07/2019 2105   PROTEINUR NEGATIVE 05/07/2019 2105   UROBILINOGEN 0.2 09/27/2015 2103   UROBILINOGEN 0.2 04/19/2008 1937   NITRITE  POSITIVE (A) 05/07/2019 2105   LEUKOCYTESUR LARGE (A) 05/07/2019 2105   Sepsis Labs Invalid input(s): PROCALCITONIN,  WBC,  LACTICIDVEN Microbiology Recent Results (from the past 240 hour(s))  Urine culture     Status: Abnormal   Collection Time: 05/07/19  8:17 PM   Specimen: Urine, Random  Result Value Ref Range Status   Specimen Description   Final    URINE, RANDOM Performed at [facility], [place] [address], [place], [place] [address]    Special Requests   Final    NONE Performed at [facility], [place] [address] [place], [place] [address]    Culture (A)  Final    >=100,000 COLONIES/mL ESCHERICHIA COLI Confirmed Extended Spectrum Beta-Lactamase Producer (ESBL).  In bloodstream infections from ESBL organisms, carbapenems are preferred over piperacillin/tazobactam. They are shown to have a lower risk of mortality.    Report Status 05/09/2019 FINAL  Final   Organism ID, Bacteria ESCHERICHIA COLI (A)  Final      Susceptibility   Escherichia coli - MIC*    AMPICILLIN >=32 RESISTANT Resistant     CEFAZOLIN >=64 RESISTANT Resistant     CEFTRIAXONE >=64 RESISTANT Resistant     CIPROFLOXACIN <=0.25 SENSITIVE Sensitive     GENTAMICIN <=1 SENSITIVE Sensitive     IMIPENEM <=0.25 SENSITIVE Sensitive     NITROFURANTOIN 64 INTERMEDIATE Intermediate     TRIMETH/SULFA >=320 RESISTANT Resistant     AMPICILLIN/SULBACTAM 4 SENSITIVE Sensitive     PIP/TAZO <=4 SENSITIVE Sensitive     Extended ESBL POSITIVE Resistant     * >=100,000 COLONIES/mL ESCHERICHIA COLI  Culture, blood (Routine X 2) w Reflex to ID Panel     Status: Abnormal   Collection Time: 05/08/19  1:21 AM   Specimen: BLOOD  Result Value Ref Range Status   Specimen Description   Final    BLOOD LEFT WRIST Performed at [facility] [address], [place], [place] [address]    Special Requests   Final    BOTTLES DRAWN AEROBIC AND ANAEROBIC Blood Culture adequate  volume Performed at [place] [place] [address] [place], [place] [address]    Culture  Setup Time   Final    GRAM NEGATIVE RODS IN BOTH AEROBIC AND ANAEROBIC BOTTLES CRITICAL RESULT CALLED TO, READ BACK BY AND VERIFIED WITH: A [facility] PHARMD 05/08/19 1754 Performed at [facility], [place] [address] [place], [place] [address]    Culture (A)  Final    ESCHERICHIA COLI Confirmed Extended Spectrum Beta-Lactamase Producer (ESBL).  In bloodstream infections from ESBL organisms, carbapenems are preferred over piperacillin/tazobactam. They are shown to have a lower risk of mortality.    Report Status 05/10/2019 FINAL  Final   Organism ID,  Bacteria ESCHERICHIA COLI  Final      Susceptibility   Escherichia coli - MIC*    AMPICILLIN >=32 RESISTANT Resistant     CEFAZOLIN >=64 RESISTANT Resistant     CEFEPIME RESISTANT Resistant     CEFTAZIDIME RESISTANT Resistant     CEFTRIAXONE >=64 RESISTANT Resistant     CIPROFLOXACIN <=0.25 SENSITIVE Sensitive     GENTAMICIN <=1 SENSITIVE Sensitive     IMIPENEM <=0.25 SENSITIVE Sensitive     TRIMETH/SULFA >=320 RESISTANT Resistant     AMPICILLIN/SULBACTAM 4 SENSITIVE Sensitive     PIP/TAZO <=4 SENSITIVE Sensitive     Extended ESBL POSITIVE Resistant     * ESCHERICHIA COLI  Blood Culture ID Panel (Reflexed)     Status: Abnormal   Collection Time: 05/08/19  1:21 AM  Result Value Ref Range Status   Enterococcus species NOT DETECTED NOT DETECTED Final   Listeria monocytogenes NOT DETECTED NOT DETECTED Final   Staphylococcus species NOT DETECTED NOT DETECTED Final   Staphylococcus aureus (BCID) NOT DETECTED NOT DETECTED Final   Streptococcus species NOT DETECTED NOT DETECTED Final   Streptococcus agalactiae NOT DETECTED NOT DETECTED Final   Streptococcus pneumoniae NOT DETECTED NOT DETECTED Final   Streptococcus pyogenes NOT DETECTED NOT DETECTED Final   Acinetobacter baumannii NOT DETECTED NOT DETECTED Final   Enterobacteriaceae  species DETECTED (A) NOT DETECTED Final    Comment: Enterobacteriaceae represent a large family of gram-negative bacteria, not a single organism. CRITICAL RESULT CALLED TO, READ BACK BY AND VERIFIED WITH: A PHAM PHARMD 05/08/19 1754 JDW    Enterobacter cloacae complex NOT DETECTED NOT DETECTED Final   Escherichia coli DETECTED (A) NOT DETECTED Final    Comment: CRITICAL RESULT CALLED TO, READ BACK BY AND VERIFIED WITH: A PHAM PHARMD 05/08/19 1754 JDW    Klebsiella oxytoca NOT DETECTED NOT DETECTED Final   Klebsiella pneumoniae NOT DETECTED NOT DETECTED Final   Proteus species NOT DETECTED NOT DETECTED Final   Serratia marcescens NOT DETECTED NOT DETECTED Final   Carbapenem resistance NOT DETECTED NOT DETECTED Final   Haemophilus influenzae NOT DETECTED NOT DETECTED Final   Neisseria meningitidis NOT DETECTED NOT DETECTED Final   Pseudomonas aeruginosa NOT DETECTED NOT DETECTED Final   Candida albicans NOT DETECTED NOT DETECTED Final   Candida glabrata NOT DETECTED NOT DETECTED Final   Candida krusei NOT DETECTED NOT DETECTED Final   Candida parapsilosis NOT DETECTED NOT DETECTED Final   Candida tropicalis NOT DETECTED NOT DETECTED Final    Comment: Performed at [facility], [place] [address] [place], [place] [address]  Culture, blood (Routine X 2) w Reflex to ID Panel     Status: Abnormal   Collection Time: 05/08/19  1:26 AM   Specimen: BLOOD  Result Value Ref Range Status   Specimen Description BLOOD LEFT ANTECUBITAL  Final   Special Requests   Final    BOTTLES DRAWN AEROBIC AND ANAEROBIC Blood Culture results may not be optimal due to an excessive volume of blood received in culture bottles   Culture  Setup Time   Final    IN BOTH AEROBIC AND ANAEROBIC BOTTLES GRAM NEGATIVE RODS CRITICAL VALUE NOTED.  VALUE IS CONSISTENT WITH PREVIOUSLY REPORTED AND CALLED VALUE.    Culture (A)  Final    ESCHERICHIA COLI SUSCEPTIBILITIES PERFORMED ON PREVIOUS CULTURE WITHIN THE LAST 5  DAYS.    Report Status 05/10/2019 FINAL  Final  SARS CORONAVIRUS 2 Nasal Swab Aptima Multi Swab     Status: None  Collection Time: 05/08/19  1:48 AM   Specimen: Aptima Multi Swab; Nasal Swab  Result Value Ref Range Status   SARS Coronavirus 2 NEGATIVE NEGATIVE Final    Comment: (NOTE) SARS-CoV-2 target nucleic acids are NOT DETECTED. The SARS-CoV-2 RNA is generally detectable in upper and lower respiratory specimens during the acute phase of infection. Negative results do not preclude SARS-CoV-2 infection, do not rule out co-infections with other pathogens, and should not be used as the sole basis for treatment or other patient management decisions. Negative results must be combined with clinical observations, patient history, and epidemiological information. The expected result is Negative. Fact Sheet for Patients: SugarRoll.be Fact Sheet for Healthcare Providers: https://www.woods-mathews.com/ This test is not yet approved or cleared by the [place] FDA and  has been authorized for detection and/or diagnosis of SARS-CoV-2 by FDA under an Emergency Use Authorization (EUA). This EUA will remain  in effect (meaning this test can be used) for the duration of the COVID-19 declaration under Section 56 4(b)(1) of the Act, 21 U.S.C. section 360bbb-3(b)(1), unless the authorization is terminated or revoked sooner. Performed at [facility], [place] [address], [place], [place] [address]   Culture, blood (Routine X 2) w Reflex to ID Panel     Status: None (Preliminary result)   Collection Time: 05/10/19  9:44 AM   Specimen: BLOOD  Result Value Ref Range Status   Specimen Description   Final    BLOOD RIGHT ANTECUBITAL Performed at [place] [address], [place], [place] [address]    Special Requests   Final    BOTTLES DRAWN AEROBIC ONLY Blood Culture adequate volume Performed at [place] [address] [place], [place] [address]    Culture   Final    NO GROWTH 3 DAYS Performed at [facility], [place] [address] [place], [place] [address]    Report Status PENDING  Incomplete  Culture, blood (Routine X 2) w Reflex to ID Panel     Status: None (Preliminary result)   Collection Time: 05/10/19  9:44 AM   Specimen: BLOOD  Result Value Ref Range Status   Specimen Description   Final    BLOOD RIGHT ANTECUBITAL Performed at [place] [address] [name], [place] [address]    Special Requests   Final    BOTTLES DRAWN AEROBIC ONLY Blood Culture adequate volume Performed at [place] [address] [place], [place] [address]    Culture   Final    NO GROWTH 3 DAYS Performed at [facility], [place] [address] [place], [place] [address]    Report Status PENDING  Incomplete   Time spent: 30 min  SIGNED:   Marylu Lund, MD  [facility] 05/13/2019, 2:38 PM  If 7PM-7AM, please contact night-coverage

## 2019-05-13 NOTE — Progress Notes (Signed)
Physical Therapy Treatment Patient Details Name: Destiny Moody MRN: [id number] DOB: [birth year] Today's Date: 05/13/2019    History of Present Illness 68 year old lady with prior history of hypertension, type 2 diabetes mellitus, breast cancer, rheumatoid arthritis, hypothyroidism, hyperlipidemia and admitted for sepsis secondary to UTI    PT Comments    Pt able to ambulate around unit and reports likely d/c home today.  No f/u PT needs or equipment.   Follow Up Recommendations  No PT follow up     Equipment Recommendations  None recommended by PT    Recommendations for Other Services       Precautions / Restrictions Precautions Precautions: None    Mobility  Bed Mobility               General bed mobility comments: pt up in recliner on arrival  Transfers Overall transfer level: Needs assistance Equipment used: None Transfers: Sit to/from Stand Sit to Stand: Min guard;Supervision         General transfer comment: min/guard for safety  Ambulation/Gait Ambulation/Gait assistance: Min Gaffer (Feet): 400 Feet Assistive device: IV Pole Gait Pattern/deviations: Step-through pattern;Decreased stride length Gait velocity: decr   General Gait Details: pt pushed IV pole however not required for support   Stairs             Wheelchair Mobility    Modified Rankin (Stroke Patients Only)       Balance Overall balance assessment: No apparent balance deficits (not formally assessed)                                          Cognition Arousal/Alertness: Awake/alert Behavior During Therapy: WFL for tasks assessed/performed Overall Cognitive Status: Within Functional Limits for tasks assessed                                        Exercises      General Comments General comments (skin integrity, edema, etc.): pt able to go through her back and apply her contacts in bathroom      Pertinent  Vitals/Pain Pain Assessment: No/denies pain    Home Living                      Prior Function            PT Goals (current goals can now be found in the care plan section) Progress towards PT goals: Progressing toward goals    Frequency    Min 3X/week      PT Plan Current plan remains appropriate    Co-evaluation              AM-PAC PT "6 Clicks" Mobility   Outcome Measure  Help needed turning from your back to your side while in a flat bed without using bedrails?: None Help needed moving from lying on your back to sitting on the side of a flat bed without using bedrails?: None Help needed moving to and from a bed to a chair (including a wheelchair)?: None Help needed standing up from a chair using your arms (e.g., wheelchair or bedside chair)?: A Little Help needed to walk in hospital room?: A Little Help needed climbing 3-5 steps with a railing? : A Little 6 Click Score: 21    End  of Session Equipment Utilized During Treatment: Gait belt Activity Tolerance: Patient tolerated treatment well Patient left: in chair;with chair alarm set;with call bell/phone within reach Nurse Communication: Mobility status PT Visit Diagnosis: Other abnormalities of gait and mobility (R26.89)     Time: [id number] PT Time Calculation (min) (ACUTE ONLY): 21 min  Charges:  $Gait Training: 8-22 mins           Carmelia Bake, PT, DPT Acute Rehabilitation Services Office: [phone] Pager: [phone]  Trena Platt 05/13/2019, 2:48 PM

## 2019-05-13 NOTE — TOC Initial Note (Signed)
Transition of Care [facility]) - Initial/Assessment Note    Patient Details  Name: Destiny Moody MRN: [id number] Date of Birth: Jun 11, 1951  Transition of Care [facility]) CM/SW Contact:    Purcell Mouton, RN Phone Number: 05/13/2019, 11:05 AM  Clinical Narrative:                 Pt discharging home with [facility]. In house rep aware and pt agreed.   Expected Discharge Plan: [place] Barriers to Discharge: No Barriers Identified   Patient Goals and CMS Choice   CMS Medicare.gov Compare Post Acute Care list provided to:: Patient Choice offered to / list presented to : Patient  Expected Discharge Plan and Services Expected Discharge Plan: [place]       Living arrangements for the past 2 months: Single Family Home Expected Discharge Date: 05/13/19                                    Prior Living Arrangements/Services Living arrangements for the past 2 months: Single Family Home Lives with:: Spouse Patient language and need for interpreter reviewed:: No Do you feel safe going back to the place where you live?: Yes               Activities of Daily Living Home Assistive Devices/Equipment: Contact lenses ADL Screening (condition at time of admission) Patient's cognitive ability adequate to safely complete daily activities?: Yes Is the patient deaf or have difficulty hearing?: No Does the patient have difficulty seeing, even when wearing glasses/contacts?: No Does the patient have difficulty concentrating, remembering, or making decisions?: No Patient able to express need for assistance with ADLs?: Yes Does the patient have difficulty dressing or bathing?: No Independently performs ADLs?: Yes (appropriate for developmental age) Does the patient have difficulty walking or climbing stairs?: Yes(secondary to weakness) Weakness of Legs: Both Weakness of Arms/Hands: None  Permission Sought/Granted Permission sought to  share information with : Case Manager                Emotional Assessment Appearance:: Appears stated age     Orientation: : Oriented to Self, Oriented to Place, Oriented to  Time, Oriented to Situation      Admission diagnosis:  Pyelonephritis [N12] Rectal mass [K62.89] Sepsis, due to unspecified organism, unspecified whether acute organ dysfunction present [facility]) [A41.9] Patient Active Problem List   Diagnosis Date Noted  . Sepsis secondary to UTI ([place]) 05/08/2019  . Hypercholesterolemia   . History of breast cancer   . Hypertension   . Hypothyroidism   . Age-related osteoporosis without current pathological fracture 12/27/2016  . Genital herpes in women 12/20/2016  . Wrist pain, chronic, right 08/01/2016  . Genetic testing 04/27/2015  . Family history of colon cancer   . Family history of ovarian cancer   . Diabetes mellitus type II, non insulin dependent ([place]) 03/31/2015  . Malignant neoplasm of upper-inner quadrant of right breast in female, estrogen receptor positive ([place]) 08/26/2014  . Osteoporosis 08/10/2014  . Vaginal atrophy 04/27/2014  . GERD 04/07/2008  . FATTY LIVER DISEASE 04/07/2008   PCP:  Donald Prose, MD Pharmacy:   [facility], [place] [address] Phone: [phone] Fax: [phone]     Social Determinants of Health (SDOH) Interventions    Readmission Risk Interventions No flowsheet data found.

## 2019-05-14 LAB — GLUCOSE, CAPILLARY: Glucose-Capillary: 193 mg/dL — ABNORMAL HIGH (ref 70–99)

## 2019-05-15 LAB — CULTURE, BLOOD (ROUTINE X 2)
Culture: NO GROWTH
Culture: NO GROWTH
Special Requests: ADEQUATE
Special Requests: ADEQUATE

## 2019-05-21 ENCOUNTER — Encounter: Payer: Self-pay | Admitting: Nurse Practitioner

## 2019-05-21 ENCOUNTER — Other Ambulatory Visit: Payer: Self-pay

## 2019-05-21 ENCOUNTER — Ambulatory Visit (INDEPENDENT_AMBULATORY_CARE_PROVIDER_SITE_OTHER): Payer: Medicare Other | Admitting: Nurse Practitioner

## 2019-05-21 VITALS — BP 102/60 | HR 110 | Temp 97.0°F | Ht 62.0 in | Wt 135.6 lb

## 2019-05-21 DIAGNOSIS — R9389 Abnormal findings on diagnostic imaging of other specified body structures: Secondary | ICD-10-CM

## 2019-05-21 DIAGNOSIS — K648 Other hemorrhoids: Secondary | ICD-10-CM | POA: Diagnosis not present

## 2019-05-21 DIAGNOSIS — K625 Hemorrhage of anus and rectum: Secondary | ICD-10-CM

## 2019-05-21 MED ORDER — HYDROCORTISONE (PERIANAL) 2.5 % EX CREA: 1.0000 "application " | TOPICAL_CREAM | Freq: Two times a day (BID) | CUTANEOUS | 1 refills | Status: DC

## 2019-05-21 NOTE — Progress Notes (Signed)
Chief Complaint:   Abnormal rectum on CT scan.    IMPRESSION and PLAN:    68 year old female here for evaluation of abnormal CT suggesting presence of a focal rounded soft tissue prominence involving the posterior wall of the rectum.  Findings similar to previous exam which was done January 2017.  Patient actually had a complete screening colonoscopy with excellent prep in September 2017. The digital exam was unremarkable. -Not sure what the rectal findings on CT AP represent. Unable to appreciate a mass on DRE today. She sometimes has minor rectal bleeding in the setting of constipation but internal hemorrhoids were found on exam today.  - Start daily Metamucil for constipation -Anusol cream (intrarectally) BID x 10 days -Patient will call back if constipation and / or bleeding do not resolve with above measures  2. Recent admission for sepsis, blood and urine cultures positive for E. coli.  She has been getting IV antibiotics at home.  She has an IV in her LUE.   3. Hx of colon polyps, surveillance colonoscopy due Sept 2021.    HPI:     Patient is a 68 year old female with history of DM2, hypertension, hypothyroidism, hyperlipidemia, breast cancer, and rheumatoid arthritis.  She was admitted to the hospital late August with nausea vomiting and sepsis.  Urine and blood cultures grew E. Coli.  In the hospital she complained of some right flank pain, CT AP with contrast showed no explanation for the pain but it did show a focal rounded soft tissue prominence involving the posterior wall of the rectum, similar to the CT scan in January 2017. She is here for evaluation.   Destiny Moody has had problems with constipation for about a year.  All of her stools are not hard, greasy food causes diarrhea.  She averages about 1 bowel movement a week.  She sometimes has minor rectal bleeding associated with constipation.  Prior to a year ago her stools were loose on metformin but this resolved  after metformin was changed to extended release form.  She eats prunes to help with constipation but otherwise does not take anything for it.  No other GI complaints.   Review of systems:     No chest pain, no SOB, no fevers, no urinary sx   Past Medical History:  Diagnosis Date  . Allergy   . Breast cancer ([place]) 08/24/14   right, upper inner  . Cancer ([place])   . Depression    no meds  . HSV (herpes simplex virus) anogenital infection 2018  . Hypercholesterolemia   . Hypertension   . Hypothyroidism   . Multinodular thyroid   . NIDDM (non-insulin dependent diabetes mellitus)   . Osteoporosis 2020   T score -2.5 stable from prior DEXA  . Radiation 01/31/15-03/01/15   right  breast  . Rheumatoid arthritis(714.0)    lower back  . Wears contact lenses     Patient's surgical history, family medical history, social history, medications and allergies were all reviewed in Epic   Serum creatinine: 0.66 mg/dL 05/12/19 0403 Estimated creatinine clearance: 59.7 mL/min  Current Outpatient Medications  Medication Sig Dispense Refill  . amitriptyline (ELAVIL) 25 MG tablet TAKE ONE TABLET BY MOUTH AT BEDTIME  "OFFICE VISIT NEEDED" 90 tablet 3  . aspirin 81 MG tablet Take 81 mg by mouth daily.    . Calcium Carbonate-Vitamin D 600-200 MG-UNIT TABS Take 1,200 mg by mouth daily.     . ertapenem (INVANZ) IVPB Inject into  the vein.    . Glucosamine 500 MG CAPS Take 1 capsule by mouth daily.    Marland Kitchen ibuprofen (ADVIL,MOTRIN) 200 MG tablet Take 200 mg by mouth every 6 (six) hours as needed for mild pain or moderate pain. Reported on 01/24/2016    . liraglutide (VICTOZA) 18 MG/3ML SOPN Inject 1.2 mg into the skin daily.    . metFORMIN (GLUCOPHAGE-XR) 500 MG 24 hr tablet TAKE TWO TABLETS BY MOUTH TWICE DAILY (Patient taking differently: Take 2,000 mg by mouth every morning. ) 360 tablet 0  . Multiple Vitamin (MULTIVITAMIN) capsule Take 1 capsule by mouth daily.    . Omega-3 Fatty Acids (OMEGA-3 FISH OIL PO)  Take by mouth.    . risedronate (ACTONEL) 150 MG tablet Take 1 tablet (150 mg total) by mouth every 30 (thirty) days. with water on empty stomach, nothing by mouth or lie down for next 30 minutes. 1 tablet 12  . simvastatin (ZOCOR) 20 MG tablet Take 1 tablet (20 mg total) by mouth at bedtime. 90 tablet 3  . tamoxifen (NOLVADEX) 20 MG tablet Take 1 tablet (20 mg total) by mouth daily. 90 tablet 4  . TURMERIC PO Take 1 capsule by mouth daily.     No current facility-administered medications for this visit.     Physical Exam:     BP 102/60   Pulse (!) 110   Temp (!) 97 F (36.1 C)   Ht 5\' 2"  (1.575 m)   Wt 135 lb 9.6 oz (61.5 kg)   BMI 24.80 kg/m   GENERAL:  Pleasant female in NAD PSYCH: : Cooperative, normal affect EENT:  conjunctiva pink, mucous membranes moist, neck supple without masses CARDIAC:  RRR,  no peripheral edema PULM: Normal respiratory effort ABDOMEN:  Nondistended, soft, nontender. No obvious masses, no hepatomegaly,  normal bowel sounds RECTAL: Anterior midline hemorrhoidal tag.  No perianal lesions.  On DRE no masses were felt.  No stool or blood in vault.  On anoscopy there were inflamed internal hemorrhoids SKIN:  turgor, no lesions seen Musculoskeletal:  Normal muscle tone, normal strength NEURO: Alert and oriented x 3, no focal neurologic deficits   Destiny Moody , NP 05/21/2019, 1:47 PM

## 2019-05-21 NOTE — Progress Notes (Signed)
____________________________________________________________  Attending physician addendum:  Thank you for sending this case to me. I have reviewed the entire note, and the outlined plan seems appropriate.  Stable imaging finding since 2017,   No rectal lesion found in 2017 colonoscopy.  Possibly prominent rectal mucosal fold, especially if no finding on rectal exam today.  Wilfrid Lund, MD  ____________________________________________________________

## 2019-05-21 NOTE — Patient Instructions (Signed)
If you are age 66 or older, your body mass index should be between 23-30. Your Body mass index is 24.8 kg/m. If this is out of the aforementioned range listed, please consider follow up with your Primary Care Provider.  If you are age 5 or younger, your body mass index should be between 19-25. Your Body mass index is 24.8 kg/m. If this is out of the aformentioned range listed, please consider follow up with your Primary Care Provider.   We have sent the following medications to your pharmacy for you to pick up at your convenience: Anusol cream - use for 10 days  Start Metamucil daily. (this is over-the-counter)  If still having bleeding or constipation, call us back.  Thank you for choosing me and [facility].   Tye Savoy, NP

## 2019-06-03 ENCOUNTER — Telehealth: Payer: Self-pay | Admitting: Gastroenterology

## 2019-06-03 NOTE — Telephone Encounter (Signed)
Destiny Moody,  This patient recently saw PG in office after recent hospitalization for UTI, during which is CT scan was done that suggested a polyp-like lesion in the rectum.  It was reportedly similar in appearance to a scan in 2017.  I reviewed the scan myself, after which I change my opinion and felt she should undergo colonoscopy to investigate it further.  After speaking with her by phone yesterday, she was agreeable to a colonoscopy.  Please contact Destiny Moody and make arrangements for colonoscopy with me in the [place].  Indication is: abnormal imaging study GI tract

## 2019-06-03 NOTE — Telephone Encounter (Signed)
Left message for the patient to call back.

## 2019-06-04 NOTE — Telephone Encounter (Signed)
Spoke to the patient, scheduled the patient for the following:   9/25 at 10:00 am previsit with the nurse  10/12 at 2:30 pm colonoscopy in [place] with Dr. Loletha Carrow

## 2019-06-04 NOTE — Telephone Encounter (Signed)
Pt said she is returning your call [phone]

## 2019-06-12 ENCOUNTER — Ambulatory Visit (AMBULATORY_SURGERY_CENTER): Payer: Self-pay | Admitting: *Deleted

## 2019-06-12 ENCOUNTER — Other Ambulatory Visit: Payer: Self-pay

## 2019-06-12 VITALS — Temp 98.2°F | Ht 62.0 in | Wt 140.6 lb

## 2019-06-12 DIAGNOSIS — R9389 Abnormal findings on diagnostic imaging of other specified body structures: Secondary | ICD-10-CM

## 2019-06-12 MED ORDER — NA SULFATE-K SULFATE-MG SULF 17.5-3.13-1.6 GM/177ML PO SOLN: ORAL | 0 refills | Status: DC

## 2019-06-12 NOTE — Progress Notes (Signed)
Patient is here in-person for PV. Patient denies any allergies to eggs or soy. Patient denies any problems with anesthesia/sedation. Patient denies any oxygen use at home. Patient denies taking any diet/weight loss medications or blood thinners. Patient is not being treated for MRSA or C-diff.   Pt is aware that care partner will wait in the car during procedure; if they feel like they will be too hot to wait in the car; they may wait in the lobby. Patient is aware to bring only one care partner. We want them to wear a mask (we do not have any that we can provide them), practice social distancing, and we will check their temperatures when they get here.  I did remind patient that their care partner needs to stay in the parking lot the entire time. Pt will wear mask into building.

## 2019-06-24 ENCOUNTER — Encounter: Payer: Self-pay | Admitting: Gynecology

## 2019-06-26 ENCOUNTER — Telehealth: Payer: Self-pay | Admitting: Gastroenterology

## 2019-06-26 NOTE — Telephone Encounter (Signed)

## 2019-06-29 ENCOUNTER — Other Ambulatory Visit: Payer: Self-pay

## 2019-06-29 ENCOUNTER — Encounter: Payer: Self-pay | Admitting: Gastroenterology

## 2019-06-29 ENCOUNTER — Ambulatory Visit (AMBULATORY_SURGERY_CENTER): Payer: Medicare Other | Admitting: Gastroenterology

## 2019-06-29 VITALS — BP 141/77 | HR 81 | Temp 97.9°F | Resp 10 | Ht 62.0 in | Wt 140.0 lb

## 2019-06-29 DIAGNOSIS — R933 Abnormal findings on diagnostic imaging of other parts of digestive tract: Secondary | ICD-10-CM

## 2019-06-29 MED ORDER — SODIUM CHLORIDE 0.9 % IV SOLN: 500.0000 mL | Freq: Once | INTRAVENOUS | Status: DC

## 2019-06-29 NOTE — Progress Notes (Signed)
I called pt's husband and he was 10 minutes away from our building.  I will call him at 16:25 and see if he is out front.  No problems noted in the recovery room. maw

## 2019-06-29 NOTE — Progress Notes (Signed)
Temp JB V/S [place]

## 2019-06-29 NOTE — Op Note (Signed)
[place] Patient Name: Destiny Moody Procedure Date: 06/29/2019 3:14 PM MRN: [id number] Endoscopist: Mallie Mussel L. Loletha Carrow , MD Age: 68 Referring MD:  Date of Birth: [birth year] Gender: Female Account #: [id number] Procedure:                Colonoscopy Indications:              Abnormal CT of the GI tract (suggesting polypoid                            rectal abnormality - unchanged from Jan 2017 CT                            scan. No rectal abnormality found on Sept 2017                            colonoscopy) Medicines:                Monitored Anesthesia Care Procedure:                Pre-Anesthesia Assessment:                           - Prior to the procedure, a History and Physical                            was performed, and patient medications and                            allergies were reviewed. The patient's tolerance of                            previous anesthesia was also reviewed. The risks                            and benefits of the procedure and the sedation                            options and risks were discussed with the patient.                            All questions were answered, and informed consent                            was obtained. Prior Anticoagulants: The patient has                            taken no previous anticoagulant or antiplatelet                            agents. ASA Grade Assessment: II - A patient with                            mild systemic disease. After reviewing the risks  and benefits, the patient was deemed in                            satisfactory condition to undergo the procedure.                           After obtaining informed consent, the colonoscope                            was passed under direct vision. Throughout the                            procedure, the patient's blood pressure, pulse, and                            oxygen saturations were monitored continuously. The                           Colonoscope was introduced through the anus and                            advanced to the the cecum, identified by                            appendiceal orifice and ileocecal valve. The                            colonoscopy was somewhat difficult due to a                            redundant colon. Successful completion of the                            procedure was aided by using manual pressure. The                            patient tolerated the procedure well. The quality                            of the bowel preparation was good. The ileocecal                            valve, appendiceal orifice, and rectum were                            photographed. Scope In: 3:25:19 PM Scope Out: 3:43:51 PM Scope Withdrawal Time: 0 hours 11 minutes 57 seconds  Total Procedure Duration: 0 hours 18 minutes 32 seconds  Findings:                 The perianal and digital rectal examinations were                            normal.  The sigmoid colon was redundant.                           The exam was otherwise without abnormality on                            direct and retroflexion views. Complications:            No immediate complications. Estimated Blood Loss:     Estimated blood loss: none. Impression:               - Redundant colon.                           - The examination was otherwise normal on direct                            and retroflexion views.                           - No specimens collected. Recommendation:           - Patient has a contact number available for                            emergencies. The signs and symptoms of potential                            delayed complications were discussed with the                            patient. Return to normal activities tomorrow.                            Written discharge instructions were provided to the                            patient.                            - Resume previous diet.                           - Continue present medications.                           - No repeat screening colonoscopy due to current                            age (65 years or older) and the absence of colonic                            polyps. Henry L. Loletha Carrow, MD 06/29/2019 3:48:17 PM This report has been signed electronically.

## 2019-06-29 NOTE — Progress Notes (Signed)
To PACU, VSS. Report to [email or web address] 

## 2019-06-29 NOTE — Patient Instructions (Addendum)
YOU HAD AN ENDOSCOPIC PROCEDURE TODAY AT [facility]:   Refer to the procedure report that was given to you for any specific questions about what was found during the examination.  If the procedure report does not answer your questions, please call your gastroenterologist to clarify.  If you requested that your care partner not be given the details of your procedure findings, then the procedure report has been included in a sealed envelope for you to review at your convenience later.  YOU SHOULD EXPECT: Some feelings of bloating in the abdomen. Passage of more gas than usual.  Walking can help get rid of the air that was put into your GI tract during the procedure and reduce the bloating. If you had a lower endoscopy (such as a colonoscopy or flexible sigmoidoscopy) you may notice spotting of blood in your stool or on the toilet paper. If you underwent a bowel prep for your procedure, you may not have a normal bowel movement for a few days.  Please Note:  You might notice some irritation and congestion in your nose or some drainage.  This is from the oxygen used during your procedure.  There is no need for concern and it should clear up in a day or so.  SYMPTOMS TO REPORT IMMEDIATELY:   Following lower endoscopy (colonoscopy or flexible sigmoidoscopy):  Excessive amounts of blood in the stool  Significant tenderness or worsening of abdominal pains  Swelling of the abdomen that is new, acute  Fever of 100F or higher  For urgent or emergent issues, a gastroenterologist can be reached at any hour by calling [phone].   DIET:  We do recommend a small meal at first, but then you may proceed to your regular diet.  Drink plenty of fluids but you should avoid alcoholic beverages for 24 hours.  ACTIVITY:  You should plan to take it easy for the rest of today and you should NOT DRIVE or use heavy machinery until tomorrow (because of the sedation medicines used during the test).     FOLLOW UP: Our staff will call the number listed on your records 48-72 hours following your procedure to check on you and address any questions or concerns that you may have regarding the information given to you following your procedure. If we do not reach you, we will leave a message.  We will attempt to reach you two times.  During this call, we will ask if you have developed any symptoms of COVID 19. If you develop any symptoms (ie: fever, flu-like symptoms, shortness of breath, cough etc.) before then, please call [phone].  If you test positive for Covid 19 in the 2 weeks post procedure, please call and report this information to [place].    If any biopsies were taken you will be contacted by phone or by letter within the next 1-3 weeks.  Please call us at [phone] if you have not heard about the biopsies in 3 weeks.    SIGNATURES/CONFIDENTIALITY: You and/or your care partner have signed paperwork which will be entered into your electronic medical record.  These signatures attest to the fact that that the information above on your After Visit Summary has been reviewed and is understood.  Full responsibility of the confidentiality of this discharge information lies with you and/or your care-partner.    Your blood sugar was 121 in the recovery room You may resume your current medications today. No repeat screening colonoscopy due to current age (63  years or older) and the absence of colon polyps 06-29-2019. Please call if any questions or concerns.

## 2019-07-01 ENCOUNTER — Telehealth: Payer: Self-pay

## 2019-07-01 NOTE — Telephone Encounter (Signed)
No answer, left message to call back later today, B.Kaelynne Christley RN. 

## 2019-07-01 NOTE — Telephone Encounter (Signed)
  Follow up Call-  Call back number 06/29/2019  Post procedure Call Back phone  # [phone]  Permission to leave phone message Yes  Some recent data might be hidden     Patient questions:  Do you have a fever, pain , or abdominal swelling? No. Pain Score  0 *  Have you tolerated food without any problems? Yes.    Have you been able to return to your normal activities? Yes.    Do you have any questions about your discharge instructions: Diet   No. Medications  No. Follow up visit  No.  Do you have questions or concerns about your Care? No.  Actions: * If pain score is 4 or above: No action needed, pain <4.  1. Have you developed a fever since your procedure? no  2.   Have you had an respiratory symptoms (SOB or cough) since your procedure? no  3.   Have you tested positive for COVID 19 since your procedure no  4.   Have you had any family members/close contacts diagnosed with the COVID 19 since your procedure?  no   If yes to any of these questions please route to Joylene John, RN and Alphonsa Gin, Therapist, sports.

## 2019-07-15 ENCOUNTER — Other Ambulatory Visit: Payer: Self-pay

## 2019-07-16 ENCOUNTER — Ambulatory Visit (INDEPENDENT_AMBULATORY_CARE_PROVIDER_SITE_OTHER): Payer: Medicare Other | Admitting: Gynecology

## 2019-07-16 ENCOUNTER — Encounter: Payer: Self-pay | Admitting: Gynecology

## 2019-07-16 VITALS — BP 118/76

## 2019-07-16 DIAGNOSIS — N898 Other specified noninflammatory disorders of vagina: Secondary | ICD-10-CM

## 2019-07-16 DIAGNOSIS — N3941 Urge incontinence: Secondary | ICD-10-CM

## 2019-07-16 LAB — WET PREP FOR TRICH, YEAST, CLUE

## 2019-07-16 MED ORDER — FLUCONAZOLE 150 MG PO TABS: 150.0000 mg | ORAL_TABLET | Freq: Once | ORAL | 0 refills | Status: AC

## 2019-07-16 MED ORDER — CLINDAMYCIN PHOSPHATE 2 % VA CREA: 1.0000 | TOPICAL_CREAM | Freq: Every day | VAGINAL | 0 refills | Status: DC

## 2019-07-16 NOTE — Patient Instructions (Signed)
Take the 1 Diflucan pill for yeast.  Use the Cleocin vaginal cream nightly for 7 nights to cover any bacterial overgrowth in the vagina that could be causing the irritation.  Follow-up if your symptoms persist.

## 2019-07-16 NOTE — Progress Notes (Signed)
    Destiny Moody [birth year] [id number]        68 y.o.  G2P2 presents complaining of vaginal discharge with itching since being discharged from the hospital where it looks like she had urosepsis and was treated with antibiotics.  No odor.  Also notes some urgency symptoms with urination.  No dysuria, frequency, low back pain, fever or chills  Past medical history,surgical history, problem list, medications, allergies, family history and social history were all reviewed and documented in the EPIC chart.  Directed ROS with pertinent positives and negatives documented in the history of present illness/assessment and plan.  Exam: Caryn Bee assistant Vitals:   07/16/19 1217  BP: 118/76   General appearance:  Normal Abdomen soft nontender without masses guarding rebound Pelvic external BUS vagina with atrophic changes.  Scant discharge noted.  Cervix with atrophic changes.  Uterus grossly normal size midline mobile nontender.  Adnexa without masses or tenderness  Assessment/Plan:  68 y.o. G2P2 with exam and history as above.  Her urine analysis is negative.  Will culture for completeness.  Wet prep also is negative.  Given her history but I cover her for both yeast and bacterial vaginosis.  Diflucan 150 mg x 1 dose and Cleocin vaginal cream nightly x7 nights.  If her urgency symptoms continue then will refer her to urology.  She is going to monitor for now though and hopefully it is due to irritation from her recent severe urinary infection.    Anastasio Auerbach MD, 12:24 PM 07/16/2019

## 2019-07-18 LAB — URINALYSIS, COMPLETE W/RFL CULTURE
Bacteria, UA: NONE SEEN /HPF
Bilirubin Urine: NEGATIVE
Hgb urine dipstick: NEGATIVE
Hyaline Cast: NONE SEEN /LPF
Leukocyte Esterase: NEGATIVE
Nitrites, Initial: NEGATIVE
Protein, ur: NEGATIVE
RBC / HPF: NONE SEEN /HPF (ref 0–2)
Specific Gravity, Urine: 1.025 (ref 1.001–1.03)
pH: 5.5 (ref 5.0–8.0)

## 2019-08-25 ENCOUNTER — Other Ambulatory Visit: Payer: Self-pay

## 2019-08-25 ENCOUNTER — Inpatient Hospital Stay (HOSPITAL_COMMUNITY)
Admission: EM | Admit: 2019-08-25 | Discharge: 2019-08-31 | DRG: 872 | Disposition: A | Discharge status: Home / Self Care | Payer: Medicare Other | Attending: Internal Medicine | Admitting: Internal Medicine

## 2019-08-25 ENCOUNTER — Emergency Department (HOSPITAL_COMMUNITY): Payer: Medicare Other

## 2019-08-25 DIAGNOSIS — Z853 Personal history of malignant neoplasm of breast: Secondary | ICD-10-CM

## 2019-08-25 DIAGNOSIS — N10 Acute tubulo-interstitial nephritis: Secondary | ICD-10-CM | POA: Diagnosis present

## 2019-08-25 DIAGNOSIS — M069 Rheumatoid arthritis, unspecified: Secondary | ICD-10-CM | POA: Diagnosis present

## 2019-08-25 DIAGNOSIS — Z833 Family history of diabetes mellitus: Secondary | ICD-10-CM

## 2019-08-25 DIAGNOSIS — E1165 Type 2 diabetes mellitus with hyperglycemia: Secondary | ICD-10-CM | POA: Diagnosis present

## 2019-08-25 DIAGNOSIS — N12 Tubulo-interstitial nephritis, not specified as acute or chronic: Secondary | ICD-10-CM

## 2019-08-25 DIAGNOSIS — E785 Hyperlipidemia, unspecified: Secondary | ICD-10-CM | POA: Diagnosis present

## 2019-08-25 DIAGNOSIS — Z7982 Long term (current) use of aspirin: Secondary | ICD-10-CM

## 2019-08-25 DIAGNOSIS — Z20828 Contact with and (suspected) exposure to other viral communicable diseases: Secondary | ICD-10-CM | POA: Diagnosis present

## 2019-08-25 DIAGNOSIS — K760 Fatty (change of) liver, not elsewhere classified: Secondary | ICD-10-CM | POA: Diagnosis present

## 2019-08-25 DIAGNOSIS — F419 Anxiety disorder, unspecified: Secondary | ICD-10-CM | POA: Diagnosis present

## 2019-08-25 DIAGNOSIS — E1169 Type 2 diabetes mellitus with other specified complication: Secondary | ICD-10-CM | POA: Diagnosis present

## 2019-08-25 DIAGNOSIS — E118 Type 2 diabetes mellitus with unspecified complications: Secondary | ICD-10-CM

## 2019-08-25 DIAGNOSIS — Z7984 Long term (current) use of oral hypoglycemic drugs: Secondary | ICD-10-CM

## 2019-08-25 DIAGNOSIS — A4151 Sepsis due to Escherichia coli [E. coli]: Principal | ICD-10-CM | POA: Diagnosis present

## 2019-08-25 DIAGNOSIS — E039 Hypothyroidism, unspecified: Secondary | ICD-10-CM | POA: Diagnosis present

## 2019-08-25 DIAGNOSIS — G4700 Insomnia, unspecified: Secondary | ICD-10-CM | POA: Diagnosis present

## 2019-08-25 DIAGNOSIS — E119 Type 2 diabetes mellitus without complications: Secondary | ICD-10-CM

## 2019-08-25 DIAGNOSIS — N3281 Overactive bladder: Secondary | ICD-10-CM | POA: Diagnosis present

## 2019-08-25 DIAGNOSIS — N390 Urinary tract infection, site not specified: Secondary | ICD-10-CM | POA: Diagnosis present

## 2019-08-25 DIAGNOSIS — I10 Essential (primary) hypertension: Secondary | ICD-10-CM | POA: Diagnosis present

## 2019-08-25 DIAGNOSIS — M810 Age-related osteoporosis without current pathological fracture: Secondary | ICD-10-CM | POA: Diagnosis present

## 2019-08-25 DIAGNOSIS — Z91013 Allergy to seafood: Secondary | ICD-10-CM

## 2019-08-25 DIAGNOSIS — Z87440 Personal history of urinary (tract) infections: Secondary | ICD-10-CM

## 2019-08-25 DIAGNOSIS — Z79899 Other long term (current) drug therapy: Secondary | ICD-10-CM

## 2019-08-25 DIAGNOSIS — A419 Sepsis, unspecified organism: Secondary | ICD-10-CM | POA: Diagnosis present

## 2019-08-25 DIAGNOSIS — Z8619 Personal history of other infectious and parasitic diseases: Secondary | ICD-10-CM

## 2019-08-25 DIAGNOSIS — Z8249 Family history of ischemic heart disease and other diseases of the circulatory system: Secondary | ICD-10-CM

## 2019-08-25 LAB — BASIC METABOLIC PANEL
Anion gap: 13 (ref 5–15)
BUN: 16 mg/dL (ref 8–23)
CO2: 21 mmol/L — ABNORMAL LOW (ref 22–32)
Calcium: 8.9 mg/dL (ref 8.9–10.3)
Chloride: 97 mmol/L — ABNORMAL LOW (ref 98–111)
Creatinine, Ser: 0.76 mg/dL (ref 0.44–1.00)
GFR calc Af Amer: >60 mL/min (ref >60)
GFR calc non Af Amer: >60 mL/min (ref >60)
Glucose, Bld: 251 mg/dL — ABNORMAL HIGH (ref 70–99)
Potassium: 3.9 mmol/L (ref 3.5–5.1)
Sodium: 131 mmol/L — ABNORMAL LOW (ref 135–145)

## 2019-08-25 LAB — CBG MONITORING, ED: Glucose-Capillary: 211 mg/dL — ABNORMAL HIGH (ref 70–99)

## 2019-08-25 LAB — LACTIC ACID, PLASMA: Lactic Acid, Venous: 1.4 mmol/L (ref 0.5–1.9)

## 2019-08-25 LAB — URINALYSIS, ROUTINE W REFLEX MICROSCOPIC
Bilirubin Urine: NEGATIVE
Glucose, UA: >=500 mg/dL — AB
Ketones, ur: 80 mg/dL — AB
Nitrite: NEGATIVE
Protein, ur: 100 mg/dL — AB
Specific Gravity, Urine: 1.016 (ref 1.005–1.030)
WBC, UA: >50 WBC/hpf — ABNORMAL HIGH (ref 0–5)
pH: 5 (ref 5.0–8.0)

## 2019-08-25 LAB — HEPATIC FUNCTION PANEL
ALT: 49 U/L — ABNORMAL HIGH (ref 0–44)
AST: 34 U/L (ref 15–41)
Albumin: 3.8 g/dL (ref 3.5–5.0)
Alkaline Phosphatase: 52 U/L (ref 38–126)
Bilirubin, Direct: 0.2 mg/dL (ref 0.0–0.2)
Indirect Bilirubin: 0.6 mg/dL (ref 0.3–0.9)
Total Bilirubin: 0.8 mg/dL (ref 0.3–1.2)
Total Protein: 8 g/dL (ref 6.5–8.1)

## 2019-08-25 LAB — CBC
HCT: 38.2 % (ref 36.0–46.0)
Hemoglobin: 12.9 g/dL (ref 12.0–15.0)
MCH: 31.9 pg (ref 26.0–34.0)
MCHC: 33.8 g/dL (ref 30.0–36.0)
MCV: 94.6 fL (ref 80.0–100.0)
Platelets: 258 10*3/uL (ref 150–400)
RBC: 4.04 MIL/uL (ref 3.87–5.11)
RDW: 12.5 % (ref 11.5–15.5)
WBC: 15.4 10*3/uL — ABNORMAL HIGH (ref 4.0–10.5)
nRBC: 0 % (ref 0.0–0.2)

## 2019-08-25 LAB — LIPASE, BLOOD: Lipase: 28 U/L (ref 11–51)

## 2019-08-25 MED ORDER — FENTANYL CITRATE (PF) 100 MCG/2ML IJ SOLN
50.0000 ug | Freq: Once | INTRAMUSCULAR | Status: AC
  Administered 2019-08-25: 50 ug via INTRAVENOUS
  Filled 2019-08-25: qty 2

## 2019-08-25 MED ORDER — ACETAMINOPHEN 500 MG PO TABS
1000.0000 mg | ORAL_TABLET | Freq: Once | ORAL | Status: AC
  Administered 2019-08-25: 23:00:00 1000 mg via ORAL
  Filled 2019-08-25: qty 2

## 2019-08-25 MED ORDER — LACTATED RINGERS IV BOLUS
1000.0000 mL | Freq: Once | INTRAVENOUS | Status: AC
  Administered 2019-08-25: 23:00:00 1000 mL via INTRAVENOUS

## 2019-08-25 MED ORDER — SODIUM CHLORIDE 0.9 % IV SOLN
1.0000 g | Freq: Once | INTRAVENOUS | Status: AC
  Administered 2019-08-25: 1 g via INTRAVENOUS
  Filled 2019-08-25: qty 10

## 2019-08-25 MED ORDER — IOHEXOL 300 MG/ML  SOLN
100.0000 mL | Freq: Once | INTRAMUSCULAR | Status: AC | PRN
  Administered 2019-08-25: 100 mL via INTRAVENOUS

## 2019-08-25 MED ORDER — ONDANSETRON HCL 4 MG/2ML IJ SOLN
4.0000 mg | Freq: Once | INTRAMUSCULAR | Status: AC
  Administered 2019-08-25: 4 mg via INTRAVENOUS
  Filled 2019-08-25: qty 2

## 2019-08-25 NOTE — ED Provider Notes (Signed)
[facility] DEPT Provider Note   CSN: [id number] Arrival date & time: 08/25/19  1403     History   Chief Complaint Chief Complaint  Patient presents with   Flank Pain    HPI Destiny Moody is a 68 y.o. female.     The history is provided by the patient.  Abdominal Pain Pain location:  R flank and RLQ Pain quality: aching and cramping   Pain radiates to:  Does not radiate Pain severity:  Moderate Onset quality:  Gradual Duration:  4 days Timing:  Constant Progression:  Worsening Chronicity:  New Context: not previous surgeries and not sick contacts   Context comment:  Flank, RLQ pain and dysuria.  Relieved by:  Nothing Worsened by:  Nothing Associated symptoms: chills, fever, nausea and vomiting   Associated symptoms: no chest pain, no cough, no diarrhea, no dysuria, no hematuria, no shortness of breath and no sore throat   Risk factors: has not had multiple surgeries     Past Medical History:  Diagnosis Date   Allergy    Arthritis    Breast cancer ([place]) 08/24/14   right, upper inner   Cancer ([place])    Chronic kidney disease    Depression    no meds   HSV (herpes simplex virus) anogenital infection 2018   Hypercholesterolemia    Hypertension    Hypothyroidism    Multinodular thyroid    NIDDM (non-insulin dependent diabetes mellitus)    Osteoporosis 2020   T score -2.5 stable from prior DEXA   Radiation 01/31/15-03/01/15   right  breast   Rheumatoid arthritis(714.0)    lower back   Wears contact lenses     Patient Active Problem List   Diagnosis Date Noted   Sepsis secondary to UTI ([place]) 05/08/2019   Hypercholesterolemia    History of breast cancer    Hypertension    Hypothyroidism    Age-related osteoporosis without current pathological fracture 12/27/2016   Genital herpes in women 12/20/2016   Wrist pain, chronic, right 08/01/2016   Genetic testing 04/27/2015   Family history of colon  cancer    Family history of ovarian cancer    Diabetes mellitus type II, non insulin dependent ([place]) 03/31/2015   Malignant neoplasm of upper-inner quadrant of right breast in female, estrogen receptor positive ([place]) 08/26/2014   Osteoporosis 08/10/2014   Vaginal atrophy 04/27/2014   Diabetes ([place]) 04/17/2011   GERD 04/07/2008   FATTY LIVER DISEASE 04/07/2008    Past Surgical History:  Procedure Laterality Date   BREAST LUMPECTOMY Right 2016   BREAST SURGERY     Lumpectomy   CESAREAN SECTION  [id number]   x 2   CHOLECYSTECTOMY  1993   COLONOSCOPY  last 06/15/2016   greater than 12 years but not sure where colonoscopy was performed   INCISION AND DRAINAGE ABSCESS Right 11/24/2014   Procedure: INCISION AND DRAINAGE RIGHT AXILLA  ABSCESS;  Surgeon: Fanny Skates, MD;  Location: [facility];  Service: General;  Laterality: Right;   RADIOACTIVE SEED GUIDED PARTIAL MASTECTOMY WITH AXILLARY SENTINEL LYMPH NODE BIOPSY Right 10/12/2014   Procedure: RIGHT  PARTIAL MASTECTOMY WITH RADIOACTIVE SEED LOCALIZATION  RIGHT  AXILLARY SENTINEL  NODE BIOPSY;  Surgeon: Fanny Skates, MD;  Location: [facility];  Service: General;  Laterality: Right;   TUBAL LIGATION  1996   re annastomosis   WISDOM TOOTH EXTRACTION       OB History    Gravida  2   Para  2   Term      Preterm      AB      Living  2     SAB      TAB      Ectopic      Multiple      Live Births               Home Medications    Prior to Admission medications   Medication Sig Start Date End Date Taking? Authorizing Provider  amitriptyline (ELAVIL) 25 MG tablet TAKE ONE TABLET BY MOUTH AT BEDTIME  "OFFICE VISIT NEEDED" 10/18/15   Copland, Gay Filler, MD  aspirin 81 MG tablet Take 81 mg by mouth daily.    [provider]  Blood Glucose Monitoring Suppl ([place]) w/Device KIT  06/03/19   [provider]  Calcium Carbonate-Vitamin D 600-200 MG-UNIT TABS Take  1,200 mg by mouth daily.     [provider]  clindamycin (CLEOCIN) 2 % vaginal cream Place 1 Applicatorful vaginally at bedtime. 07/16/19   Fontaine, Belinda Block, MD  Glucosamine 500 MG CAPS Take 1 capsule by mouth daily.    [provider]  hydrocortisone (ANUSOL-HC) 2.5 % rectal cream Place 1 application rectally 2 (two) times daily. Make sure cream is inside. 05/21/19   Willia Craze, NP  ibuprofen (ADVIL,MOTRIN) 200 MG tablet Take 200 mg by mouth every 6 (six) hours as needed for mild pain or moderate pain. Reported on 01/24/2016    [provider]  liraglutide (VICTOZA) 18 MG/3ML SOPN Inject 1.2 mg into the skin daily.    [provider]  metFORMIN (GLUCOPHAGE-XR) 500 MG 24 hr tablet TAKE TWO TABLETS BY MOUTH TWICE DAILY Patient taking differently: Take 2,000 mg by mouth every morning.  10/28/16   Forrest Moron, MD  Multiple Vitamin (MULTIVITAMIN) capsule Take 1 capsule by mouth daily.    [provider]  Omega-3 Fatty Acids (OMEGA-3 FISH OIL PO) Take by mouth.    [provider]  risedronate (ACTONEL) 150 MG tablet Take 1 tablet (150 mg total) by mouth every 30 (thirty) days. with water on empty stomach, nothing by mouth or lie down for next 30 minutes. 02/02/19   Fontaine, Belinda Block, MD  simvastatin (ZOCOR) 20 MG tablet Take 1 tablet (20 mg total) by mouth at bedtime. 10/18/15   Copland, Gay Filler, MD  tamoxifen (NOLVADEX) 20 MG tablet Take 1 tablet (20 mg total) by mouth daily. 02/26/19   Magrinat, Virgie Dad, MD  triamcinolone cream (KENALOG) 0.1 % APP EXT AA BID PRN 03/26/19   [provider]  TURMERIC PO Take 1 capsule by mouth daily.    [provider]    Family History Family History  Problem Relation Age of Onset   Diabetes Mother        niddm   Heart disease Mother    Heart failure Father    Heart disease Father    Ovarian cancer Cousin 40   Hyperlipidemia Sister    Colon cancer Maternal Uncle 54    Esophageal cancer Neg Hx    Rectal cancer Neg Hx    Stomach cancer Neg Hx    Colon polyps Neg Hx     Social History Social History   Tobacco Use   Smoking status: Never Smoker   Smokeless tobacco: Never Used  Substance Use Topics   Alcohol use: Not Currently    Alcohol/week: 0.0 standard drinks   Drug  use: No     Allergies   Shellfish allergy   Review of Systems Review of Systems  Constitutional: Positive for chills and fever.  HENT: Negative for ear pain and sore throat.   Eyes: Negative for pain and visual disturbance.  Respiratory: Negative for cough and shortness of breath.   Cardiovascular: Negative for chest pain and palpitations.  Gastrointestinal: Positive for abdominal pain, nausea and vomiting. Negative for abdominal distention and diarrhea.  Genitourinary: Negative for dysuria and hematuria.  Musculoskeletal: Negative for arthralgias and back pain.  Skin: Negative for color change and rash.  Neurological: Negative for seizures and syncope.  All other systems reviewed and are negative.    Physical Exam Updated Vital Signs  ED Triage Vitals  Enc Vitals Group     BP 08/25/19 1411 (!) 114/56     Pulse Rate 08/25/19 1411 88     Resp 08/25/19 1411 16     Temp 08/25/19 1411 98 F (36.7 C)     Temp Source 08/25/19 1411 Oral     SpO2 08/25/19 1411 100 %     Weight 08/25/19 1429 135 lb (61.2 kg)     Height 08/25/19 1429 _0  (1.575 m)     Head Circumference --      Peak Flow --      Pain Score 08/25/19 1429 10     Pain Loc --      Pain Edu? --      Excl. in [place]? --     Physical Exam Vitals signs and nursing note reviewed.  Constitutional:      General: She is not in acute distress.    Appearance: She is well-developed. She is ill-appearing.  HENT:     Head: Normocephalic and atraumatic.     Nose: Nose normal.     Mouth/Throat:     Mouth: Mucous membranes are moist.  Eyes:     Extraocular Movements: Extraocular movements intact.      Conjunctiva/sclera: Conjunctivae normal.     Pupils: Pupils are equal, round, and reactive to light.  Neck:     Musculoskeletal: Normal range of motion and neck supple.  Cardiovascular:     Rate and Rhythm: Normal rate and regular rhythm.     Pulses: Normal pulses.     Heart sounds: Normal heart sounds. No murmur.  Pulmonary:     Effort: Pulmonary effort is normal. No respiratory distress.     Breath sounds: Normal breath sounds.  Abdominal:     Palpations: Abdomen is soft.     Tenderness: There is abdominal tenderness (RLQ). There is right CVA tenderness.  Skin:    General: Skin is warm and dry.     Capillary Refill: Capillary refill takes less than 2 seconds.  Neurological:     General: No focal deficit present.     Mental Status: She is alert.  Psychiatric:        Mood and Affect: Mood normal.      ED Treatments / Results  Labs (all labs ordered are listed, but only abnormal results are displayed) Labs Reviewed  URINALYSIS, ROUTINE W REFLEX MICROSCOPIC - Abnormal; Notable for the following components:      Result Value   APPearance CLOUDY (*)    Glucose, UA >=500 (*)    Hgb urine dipstick SMALL (*)    Ketones, ur 80 (*)    Protein, ur 100 (*)    Leukocytes,Ua LARGE (*)    WBC, UA >50 (*)  Bacteria, UA FEW (*)    Non Squamous Epithelial 21-50 (*)    All other components within normal limits  BASIC METABOLIC PANEL - Abnormal; Notable for the following components:   Sodium 131 (*)    Chloride 97 (*)    CO2 21 (*)    Glucose, Bld 251 (*)    All other components within normal limits  CBC - Abnormal; Notable for the following components:   WBC 15.4 (*)    All other components within normal limits  HEPATIC FUNCTION PANEL - Abnormal; Notable for the following components:   ALT 49 (*)    All other components within normal limits  CBG MONITORING, ED - Abnormal; Notable for the following components:   Glucose-Capillary 211 (*)    All other components within normal  limits  URINE CULTURE  CULTURE, BLOOD (ROUTINE X 2)  CULTURE, BLOOD (ROUTINE X 2)  LACTIC ACID, PLASMA  LIPASE, BLOOD  LACTIC ACID, PLASMA    EKG None  Radiology Ct Abdomen Pelvis W Contrast  Result Date: 08/25/2019 CLINICAL DATA:  Acute abdominal pain. Flank pain and right lower quadrant pain. EXAM: CT ABDOMEN AND PELVIS WITH CONTRAST TECHNIQUE: Multidetector CT imaging of the abdomen and pelvis was performed using the standard protocol following bolus administration of intravenous contrast. CONTRAST:  153m OMNIPAQUE IOHEXOL 300 MG/ML  SOLN COMPARISON:  CT 05/08/2019 FINDINGS: Lower chest: Acute airspace disease. Normal heart size with coronary artery calcifications. No pleural fluid. Hepatobiliary: No focal hepatic abnormality. Clips in the gallbladder fossa postcholecystectomy. No biliary dilatation. Pancreas: No ductal dilatation or inflammation. Spleen: Normal in size without focal abnormality. Adrenals/Urinary Tract: Normal adrenal glands. Heterogeneous right renal enhancement with areas of decreased enhancement, particularly in the upper pole. Mild right perinephric edema. No hydronephrosis. No focal renal fluid collection. Mild urinary bladder wall thickening and perivesicular stranding. Small cysts in the left kidney which is otherwise unremarkable. No left hydronephrosis. Stomach/Bowel: Nondistended stomach. No small bowel inflammation, wall thickening or obstruction. Normal appendix, series 2, image 50. Scattered high-density ingested material throughout the colon. Moderate colonic stool burden. No colonic wall thickening. No pericolonic edema. Previous soft tissue prominence in the region of the posterior rectum is not definitively seen. Vascular/Lymphatic: Moderate to advanced aortic atherosclerosis. No aneurysm. Portal vein is patent. No adenopathy. Reproductive: Retroverted uterus. No adnexal mass. Other: No free air, free fluid, or intra-abdominal fluid collection. Small fat  containing infraumbilical paramedian ventral abdominal wall hernia contains only fat. Musculoskeletal: There are no acute or suspicious osseous abnormalities. Degenerative change in the spine, prominent degenerative disc disease at L3-L4 appears similar. IMPRESSION: 1. Findings consistent with right pyelonephritis.  No renal abscess. 2. No other acute findings.  Normal appendix. Aortic Atherosclerosis (ICD10-I70.0). Electronically Signed   By: MKeith RakeM.D.   On: 08/25/2019 23:51   Dg Chest Portable 1 View  Result Date: 08/25/2019 CLINICAL DATA:  Fevers EXAM: PORTABLE CHEST 1 VIEW COMPARISON:  07/09/2015 FINDINGS: Cardiac shadow is stable. The lungs are well aerated bilaterally. Postsurgical changes are noted in the right breast. No focal infiltrate or sizable effusion is seen. No bony abnormality is noted. IMPRESSION: No acute abnormality noted. Electronically Signed   By: MInez CatalinaM.D.   On: 08/25/2019 22:22    Procedures .Critical Care Performed by: CLennice Sites DO Authorized by: CLennice Sites DO   Critical care provider statement:    Critical care time (minutes):  40   Critical care was necessary to treat or prevent imminent or life-threatening deterioration of  the following conditions:  Sepsis   Critical care was time spent personally by me on the following activities:  Blood draw for specimens, development of treatment plan with patient or surrogate, discussions with primary provider, evaluation of patient's response to treatment, examination of patient, obtaining history from patient or surrogate, ordering and performing treatments and interventions, ordering and review of laboratory studies, pulse oximetry, ordering and review of radiographic studies, re-evaluation of patient's condition and review of old charts   I assumed direction of critical care for this patient from another provider in my specialty: no     (including critical care time)  Medications Ordered in  ED Medications  cefTRIAXone (ROCEPHIN) 1 g in sodium chloride 0.9 % 100 mL IVPB (0 g Intravenous Stopped 08/26/19 0001)  lactated ringers bolus 1,000 mL (1,000 mLs Intravenous New Bag/Given 08/25/19 2253)  acetaminophen (TYLENOL) tablet 1,000 mg (1,000 mg Oral Given 08/25/19 2242)  ondansetron (ZOFRAN) injection 4 mg (4 mg Intravenous Given 08/25/19 2243)  fentaNYL (SUBLIMAZE) injection 50 mcg (50 mcg Intravenous Given 08/25/19 2243)  iohexol (OMNIPAQUE) 300 MG/ML solution 100 mL (100 mLs Intravenous Contrast Given 08/25/19 2312)     Initial Impression / Assessment and Plan / ED Course  I have reviewed the triage vital signs and the nursing notes.  Pertinent labs & imaging results that were available during my care of the patient were reviewed by me and considered in my medical decision making (see chart for details).     Destiny Moody is a 68 year old female with history of diabetes, breast cancer in remission on tamoxifen who presents to the ED with abdominal pain, dysuria.  Upon my evaluation patient is febrile and tachycardic.  Has been in the waiting room for several hours.  Concern now for sepsis.  Urinalysis already shows signs of infection.  She has right flank pain, right lower quadrant abdominal pain.  History of pyelonephritis and sepsis several months ago with similar presentation.  She has a leukocytosis of 15.4.  Creatinine is unremarkable.  Blood sugar is mildly elevated.  Will expand work-up to include blood cultures, lactic acid, chest x-ray.  Will give IV fluids, IV Rocephin, IV Zofran, IV fentanyl.  Will get a CT scan to evaluate for appendicitis, kidney stone versus other intra-abdominal process.  Patient has had symptoms for the last several days.  Anticipate admission.  No respiratory symptoms.  Will get COVID test.  Lactic acid is normal.  Gallbladder and liver enzymes normal.  Lipase normal.  CT scan shows pyelonephritis.  No kidney stone.  No appendicitis.  There is no renal  abscess.  Will admit for further sepsis care.  This chart was dictated using voice recognition software.  Despite best efforts to proofread,  errors can occur which can change the documentation meaning.    Final Clinical Impressions(s) / ED Diagnoses   Final diagnoses:  Sepsis, due to unspecified organism, unspecified whether acute organ dysfunction present [facility])  Pyelonephritis    ED Discharge Orders    None       Lennice Sites, DO 08/26/19 0001

## 2019-08-25 NOTE — ED Triage Notes (Signed)
Patient states since Saturday she has had kidney infection and urine infection, when she urinates she has pain. Patient says pain is getting worse and now when she moved her RLQ area is in extreme pain. Patient states she was seen here for her kidney infection.

## 2019-08-26 ENCOUNTER — Encounter (HOSPITAL_COMMUNITY): Payer: Self-pay

## 2019-08-26 DIAGNOSIS — N12 Tubulo-interstitial nephritis, not specified as acute or chronic: Secondary | ICD-10-CM | POA: Diagnosis present

## 2019-08-26 DIAGNOSIS — A419 Sepsis, unspecified organism: Secondary | ICD-10-CM | POA: Diagnosis not present

## 2019-08-26 DIAGNOSIS — N10 Acute tubulo-interstitial nephritis: Secondary | ICD-10-CM | POA: Diagnosis present

## 2019-08-26 DIAGNOSIS — K760 Fatty (change of) liver, not elsewhere classified: Secondary | ICD-10-CM | POA: Diagnosis present

## 2019-08-26 DIAGNOSIS — N3281 Overactive bladder: Secondary | ICD-10-CM | POA: Diagnosis present

## 2019-08-26 DIAGNOSIS — E1169 Type 2 diabetes mellitus with other specified complication: Secondary | ICD-10-CM | POA: Diagnosis present

## 2019-08-26 DIAGNOSIS — Z7984 Long term (current) use of oral hypoglycemic drugs: Secondary | ICD-10-CM | POA: Diagnosis not present

## 2019-08-26 DIAGNOSIS — E785 Hyperlipidemia, unspecified: Secondary | ICD-10-CM | POA: Diagnosis present

## 2019-08-26 DIAGNOSIS — Z8619 Personal history of other infectious and parasitic diseases: Secondary | ICD-10-CM | POA: Diagnosis not present

## 2019-08-26 DIAGNOSIS — M810 Age-related osteoporosis without current pathological fracture: Secondary | ICD-10-CM | POA: Diagnosis present

## 2019-08-26 DIAGNOSIS — Z8249 Family history of ischemic heart disease and other diseases of the circulatory system: Secondary | ICD-10-CM | POA: Diagnosis not present

## 2019-08-26 DIAGNOSIS — Z79899 Other long term (current) drug therapy: Secondary | ICD-10-CM | POA: Diagnosis not present

## 2019-08-26 DIAGNOSIS — Z833 Family history of diabetes mellitus: Secondary | ICD-10-CM | POA: Diagnosis not present

## 2019-08-26 DIAGNOSIS — G4700 Insomnia, unspecified: Secondary | ICD-10-CM | POA: Diagnosis present

## 2019-08-26 DIAGNOSIS — Z91013 Allergy to seafood: Secondary | ICD-10-CM | POA: Diagnosis not present

## 2019-08-26 DIAGNOSIS — E1165 Type 2 diabetes mellitus with hyperglycemia: Secondary | ICD-10-CM | POA: Diagnosis present

## 2019-08-26 DIAGNOSIS — E039 Hypothyroidism, unspecified: Secondary | ICD-10-CM | POA: Diagnosis present

## 2019-08-26 DIAGNOSIS — F419 Anxiety disorder, unspecified: Secondary | ICD-10-CM | POA: Diagnosis present

## 2019-08-26 DIAGNOSIS — I10 Essential (primary) hypertension: Secondary | ICD-10-CM | POA: Diagnosis present

## 2019-08-26 DIAGNOSIS — Z853 Personal history of malignant neoplasm of breast: Secondary | ICD-10-CM | POA: Diagnosis not present

## 2019-08-26 DIAGNOSIS — Z20828 Contact with and (suspected) exposure to other viral communicable diseases: Secondary | ICD-10-CM | POA: Diagnosis present

## 2019-08-26 DIAGNOSIS — N390 Urinary tract infection, site not specified: Secondary | ICD-10-CM

## 2019-08-26 DIAGNOSIS — Z7982 Long term (current) use of aspirin: Secondary | ICD-10-CM | POA: Diagnosis not present

## 2019-08-26 DIAGNOSIS — M069 Rheumatoid arthritis, unspecified: Secondary | ICD-10-CM | POA: Diagnosis present

## 2019-08-26 DIAGNOSIS — A4151 Sepsis due to Escherichia coli [E. coli]: Secondary | ICD-10-CM | POA: Diagnosis present

## 2019-08-26 DIAGNOSIS — Z87440 Personal history of urinary (tract) infections: Secondary | ICD-10-CM | POA: Diagnosis not present

## 2019-08-26 LAB — CBC
HCT: 37.2 % (ref 36.0–46.0)
HCT: 31.2 % — ABNORMAL LOW (ref 36.0–46.0)
Hemoglobin: 12.8 g/dL (ref 12.0–15.0)
Hemoglobin: 10.4 g/dL — ABNORMAL LOW (ref 12.0–15.0)
MCH: 32 pg (ref 26.0–34.0)
MCH: 31.4 pg (ref 26.0–34.0)
MCHC: 34.4 g/dL (ref 30.0–36.0)
MCHC: 33.3 g/dL (ref 30.0–36.0)
MCV: 93 fL (ref 80.0–100.0)
MCV: 94.3 fL (ref 80.0–100.0)
Platelets: 217 10*3/uL (ref 150–400)
Platelets: 201 10*3/uL (ref 150–400)
RBC: 4 MIL/uL (ref 3.87–5.11)
RBC: 3.31 MIL/uL — ABNORMAL LOW (ref 3.87–5.11)
RDW: 12.7 % (ref 11.5–15.5)
RDW: 12.6 % (ref 11.5–15.5)
WBC: 16.1 10*3/uL — ABNORMAL HIGH (ref 4.0–10.5)
WBC: 14 10*3/uL — ABNORMAL HIGH (ref 4.0–10.5)
nRBC: 0 % (ref 0.0–0.2)
nRBC: 0 % (ref 0.0–0.2)

## 2019-08-26 LAB — BLOOD CULTURE ID PANEL (REFLEXED)

## 2019-08-26 LAB — GLUCOSE, CAPILLARY
Glucose-Capillary: 220 mg/dL — ABNORMAL HIGH (ref 70–99)
Glucose-Capillary: 198 mg/dL — ABNORMAL HIGH (ref 70–99)
Glucose-Capillary: 307 mg/dL — ABNORMAL HIGH (ref 70–99)
Glucose-Capillary: 267 mg/dL — ABNORMAL HIGH (ref 70–99)
Glucose-Capillary: 327 mg/dL — ABNORMAL HIGH (ref 70–99)

## 2019-08-26 LAB — BASIC METABOLIC PANEL
Anion gap: 13 (ref 5–15)
Anion gap: 9 (ref 5–15)
BUN: 12 mg/dL (ref 8–23)
BUN: 12 mg/dL (ref 8–23)
CO2: 18 mmol/L — ABNORMAL LOW (ref 22–32)
CO2: 20 mmol/L — ABNORMAL LOW (ref 22–32)
Calcium: 8.5 mg/dL — ABNORMAL LOW (ref 8.9–10.3)
Calcium: 7.8 mg/dL — ABNORMAL LOW (ref 8.9–10.3)
Chloride: 104 mmol/L (ref 98–111)
Chloride: 102 mmol/L (ref 98–111)
Creatinine, Ser: 0.64 mg/dL (ref 0.44–1.00)
Creatinine, Ser: 0.74 mg/dL (ref 0.44–1.00)
GFR calc Af Amer: >60 mL/min (ref >60)
GFR calc Af Amer: >60 mL/min (ref >60)
GFR calc non Af Amer: >60 mL/min (ref >60)
GFR calc non Af Amer: >60 mL/min (ref >60)
Glucose, Bld: 196 mg/dL — ABNORMAL HIGH (ref 70–99)
Glucose, Bld: 317 mg/dL — ABNORMAL HIGH (ref 70–99)
Potassium: 3.9 mmol/L (ref 3.5–5.1)
Potassium: 3.6 mmol/L (ref 3.5–5.1)
Sodium: 135 mmol/L (ref 135–145)
Sodium: 131 mmol/L — ABNORMAL LOW (ref 135–145)

## 2019-08-26 LAB — SARS CORONAVIRUS 2 (TAT 6-24 HRS): SARS Coronavirus 2: NEGATIVE

## 2019-08-26 LAB — HEMOGLOBIN A1C
Hgb A1c MFr Bld: 7 % — ABNORMAL HIGH (ref 4.8–5.6)
Mean Plasma Glucose: 154.2 mg/dL

## 2019-08-26 MED ORDER — SODIUM CHLORIDE 0.9 % IV SOLN
INTRAVENOUS | Status: DC
  Administered 2019-08-26 – 2019-08-29 (×6): via INTRAVENOUS

## 2019-08-26 MED ORDER — TAMOXIFEN CITRATE 10 MG PO TABS
20.0000 mg | ORAL_TABLET | Freq: Every day | ORAL | Status: DC
  Administered 2019-08-26 – 2019-08-31 (×6): 20 mg via ORAL
  Filled 2019-08-26 (×6): qty 2

## 2019-08-26 MED ORDER — ACETAMINOPHEN 650 MG RE SUPP: 650.0000 mg | Freq: Four times a day (QID) | RECTAL | Status: DC | PRN

## 2019-08-26 MED ORDER — IBUPROFEN 400 MG PO TABS
600.0000 mg | ORAL_TABLET | Freq: Four times a day (QID) | ORAL | Status: DC | PRN
  Administered 2019-08-26 (×2): 600 mg via ORAL
  Filled 2019-08-26 (×2): qty 1

## 2019-08-26 MED ORDER — ACETAMINOPHEN 325 MG PO TABS
650.0000 mg | ORAL_TABLET | Freq: Four times a day (QID) | ORAL | Status: DC | PRN
  Administered 2019-08-27: 650 mg via ORAL
  Filled 2019-08-26: qty 2

## 2019-08-26 MED ORDER — ONDANSETRON HCL 4 MG/2ML IJ SOLN: 4.0000 mg | Freq: Four times a day (QID) | INTRAMUSCULAR | Status: DC | PRN

## 2019-08-26 MED ORDER — SIMVASTATIN 20 MG PO TABS
20.0000 mg | ORAL_TABLET | Freq: Every day | ORAL | Status: DC
  Administered 2019-08-26 – 2019-08-30 (×6): 20 mg via ORAL
  Filled 2019-08-26 (×6): qty 1

## 2019-08-26 MED ORDER — ENOXAPARIN SODIUM 40 MG/0.4ML ~~LOC~~ SOLN
40.0000 mg | SUBCUTANEOUS | Status: DC
  Administered 2019-08-26 – 2019-08-31 (×6): 40 mg via SUBCUTANEOUS
  Filled 2019-08-26 (×6): qty 0.4

## 2019-08-26 MED ORDER — INSULIN ASPART 100 UNIT/ML ~~LOC~~ SOLN
0.0000 [IU] | Freq: Every day | SUBCUTANEOUS | Status: DC
  Administered 2019-08-26: 2 [IU] via SUBCUTANEOUS
  Administered 2019-08-26: 4 [IU] via SUBCUTANEOUS
  Administered 2019-08-27 – 2019-08-29 (×3): 3 [IU] via SUBCUTANEOUS
  Administered 2019-08-30: 22:00:00 4 [IU] via SUBCUTANEOUS
  Filled 2019-08-26: qty 0.05

## 2019-08-26 MED ORDER — SODIUM CHLORIDE 0.9 % IV SOLN
1.0000 g | Freq: Three times a day (TID) | INTRAVENOUS | Status: DC
  Administered 2019-08-26 – 2019-08-31 (×17): 1 g via INTRAVENOUS
  Filled 2019-08-26 (×18): qty 1

## 2019-08-26 MED ORDER — INSULIN ASPART 100 UNIT/ML ~~LOC~~ SOLN
0.0000 [IU] | Freq: Three times a day (TID) | SUBCUTANEOUS | Status: DC
  Administered 2019-08-26: 5 [IU] via SUBCUTANEOUS
  Administered 2019-08-26: 7 [IU] via SUBCUTANEOUS
  Administered 2019-08-26: 2 [IU] via SUBCUTANEOUS
  Administered 2019-08-27: 7 [IU] via SUBCUTANEOUS
  Administered 2019-08-27: 3 [IU] via SUBCUTANEOUS
  Administered 2019-08-27: 9 [IU] via SUBCUTANEOUS
  Administered 2019-08-28: 08:00:00 5 [IU] via SUBCUTANEOUS
  Administered 2019-08-28: 7 [IU] via SUBCUTANEOUS
  Administered 2019-08-28: 5 [IU] via SUBCUTANEOUS
  Administered 2019-08-29 – 2019-08-30 (×5): 3 [IU] via SUBCUTANEOUS
  Administered 2019-08-30: 9 [IU] via SUBCUTANEOUS
  Administered 2019-08-31: 5 [IU] via SUBCUTANEOUS
  Administered 2019-08-31: 09:00:00 3 [IU] via SUBCUTANEOUS
  Filled 2019-08-26: qty 0.09

## 2019-08-26 MED ORDER — ONDANSETRON HCL 4 MG PO TABS: 4.0000 mg | ORAL_TABLET | Freq: Four times a day (QID) | ORAL | Status: DC | PRN

## 2019-08-26 NOTE — Progress Notes (Signed)
PROGRESS NOTE    Destiny Moody  [id number] DOB: Aug 28, 1951 DOA: 08/25/2019 PCP: Donald Prose, MD  Brief Narrative:  Destiny Moody is a 68 y.o. female with medical history significant for type 2 diabetes, hyperlipidemia, history of breast cancer, and osteoporosis who presents to the ED for evaluation of dysuria with worsening right flank pain.  Patient reports about 4 days of dysuria with increased urinary frequency.  She has had associated right flank and right lower quadrant abdominal pain.  She reports some nausea, vomiting, chills, and diaphoresis but denies subjective fever.  She denies any chest pain, dyspnea, cough.  Patient had prior admission in August 2020 for ESBL E. coli bacteremia secondary to UTI/pyelonephritis.  In the ED she was found to have UA consistent with UTI, CT abd/pelvis showed right pyelonephritis without abscess.    Assessment & Plan:   Principal Problem:   Sepsis due to urinary tract infection ([place]) Active Problems:   Diabetes mellitus type II, non insulin dependent ([place])   Hyperlipidemia associated with type 2 diabetes mellitus ([place])   Acute pyelonephritis  Destiny Moody is a 68 y.o. female with medical history significant for type 2 diabetes, hyperlipidemia, history of breast cancer, and osteoporosis who is admitted with sepsis due to UTI/right-sided pyelonephritis.  Sepsis due to UTI/right-sided pyelonephritis: She presented with persistent tachycardia, leukocytosis with WBC of 15.4K, fever at home and temp of 100.74F here.  Given recent history of ESBL E. coli bacteremia/UTI, will continue meropenem v Rocephin pending further culture data. -Follow-up blood and urine cultures -Continue as needed pain control and antiemetics  Hyperglycemia in type 2 diabetes: Hyperglycemic in setting of acute infection.  Home regimen includes Metformin and Victoza. -Continue SSI plus HS coverage while in hospital, adjust as needed  Hyperlipidemia: -Continue  home simvastatin  History of breast cancer: S/p right lumpectomy.  Follows with oncology, Dr. Jana Hakim, and currently on antihormonal therapy. -Continue tamoxifen  DVT prophylaxis: Lovenox Code Status: Full code Family Communication: Discussed with patient, she has discussed with family Disposition Plan: Pending clinical improvement. Consults called: None Admission status: Changed to inpatient with anticipated at least 2MN stay with the risk of progression to severe sepsis if she is not treated as inpatient.   Procedures:  None Antimicrobials:  Meropenem  Subjective: Patient reports feeling tired. Denies diarrhea. Has increase in urinary frequency, her dysuria is improving.   Objective: Vitals:   08/25/19 2147 08/26/19 0047 08/26/19 0208 08/26/19 0600  BP: 134/68 (!) 115/53 (!) 109/54 (!) 142/58  Pulse: (!) 119 89 86 88  Resp: 18 (!) 23 18 18   Temp:   98.1 F (36.7 C) 98.3 F (36.8 C)  TempSrc:   Oral Oral  SpO2: 99% 98% 100% 100%  Weight:      Height:        Intake/Output Summary (Last 24 hours) at 08/26/2019 1335 Last data filed at 08/26/2019 1000 Gross per 24 hour  Intake 3199.87 ml  Output 1550 ml  Net 1649.87 ml   Filed Weights   08/25/19 1429  Weight: 61.2 kg    Examination:  General exam: Appears calm and comfortable  Respiratory system: No respiratory distress, no cough. No wheezing. . Cardiovascular system: RRR.  No pedal edema. Gastrointestinal system: Abdomen is nondistended, soft and nontender. Central nervous system: Alert and oriented. No focal neurological deficits. Extremities: Symmetric 5 x 5 power. Skin: No rashes, lesions or ulcers Psychiatry:  Mood & affect appropriate.     Data Reviewed: I have personally reviewed following labs and  imaging studies  CBC: Recent Labs  Lab 08/25/19 2110 08/26/19 0453  WBC 15.4* 16.1*  HGB 12.9 12.8  HCT 38.2 37.2  MCV 94.6 93.0  PLT 258 [id number]   Basic Metabolic Panel: Recent Labs  Lab  08/25/19 2110 08/26/19 0453  NA 131* 135  K 3.9 3.9  CL 97* 104  CO2 21* 18*  GLUCOSE 251* 196*  BUN 16 12  CREATININE 0.76 0.64  CALCIUM 8.9 8.5*   GFR: Estimated Creatinine Clearance: 59.5 mL/min (by C-G formula based on SCr of 0.64 mg/dL). Liver Function Tests: Recent Labs  Lab 08/25/19 2110  AST 34  ALT 49*  ALKPHOS 52  BILITOT 0.8  PROT 8.0  ALBUMIN 3.8   Recent Labs  Lab 08/25/19 2110  LIPASE 28   No results for input(s): AMMONIA in the last 168 hours. Coagulation Profile: No results for input(s): INR, PROTIME in the last 168 hours. Cardiac Enzymes: No results for input(s): CKTOTAL, CKMB, CKMBINDEX, TROPONINI in the last 168 hours. BNP (last 3 results) No results for input(s): PROBNP in the last 8760 hours. HbA1C: Recent Labs    08/26/19 0453  HGBA1C 7.0*   CBG: Recent Labs  Lab 08/25/19 1550 08/26/19 0233 08/26/19 0752 08/26/19 1144  GLUCAP 211* 220* 198* 307*   Lipid Profile: No results for input(s): CHOL, HDL, LDLCALC, TRIG, CHOLHDL, LDLDIRECT in the last 72 hours. Thyroid Function Tests: No results for input(s): TSH, T4TOTAL, FREET4, T3FREE, THYROIDAB in the last 72 hours. Anemia Panel: No results for input(s): VITAMINB12, FOLATE, FERRITIN, TIBC, IRON, RETICCTPCT in the last 72 hours. Sepsis Labs: Recent Labs  Lab 08/25/19 2147  LATICACIDVEN 1.4    Recent Results (from the past 240 hour(s))  Urine C&S     Status: None (Preliminary result)   Collection Time: 08/25/19  7:52 PM   Specimen: Urine, Clean Catch  Result Value Ref Range Status   Specimen Description   Final    URINE, CLEAN CATCH Performed at [facility], [place] [address] [place], [place] [address]    Special Requests   Final    NONE Performed at [facility], [place] [address] [place], [place] [address]    Culture PENDING  Incomplete   Report Status PENDING  Incomplete  Blood culture (routine x 2)     Status: None (Preliminary result)    Collection Time: 08/25/19 10:44 PM   Specimen: BLOOD  Result Value Ref Range Status   Specimen Description   Final    BLOOD LEFT ANTECUBITAL Performed at [facility] [address], [place], [place] [address]    Special Requests   Final    BOTTLES DRAWN AEROBIC AND ANAEROBIC Blood Culture adequate volume Performed at [facility] [address], [place], [place] [address]    Culture   Final    NO GROWTH < 12 HOURS Performed at [facility] [address], [place], [place] [address]    Report Status PENDING  Incomplete  Blood culture (routine x 2)     Status: None (Preliminary result)   Collection Time: 08/25/19 10:44 PM   Specimen: BLOOD  Result Value Ref Range Status   Specimen Description   Final    BLOOD BLOOD LEFT WRIST Performed at [facility] [address], [place], [place] [address]    Special Requests   Final    BOTTLES DRAWN AEROBIC AND ANAEROBIC Blood Culture adequate volume Performed at [facility],  [place] [address], [place], [place] [address]    Culture   Final    NO GROWTH < 12 HOURS Performed at [facility] [address] [place], [place] [address]    Report Status PENDING  Incomplete  SARS CORONAVIRUS 2 (TAT 6-24 HRS) Nasopharyngeal Nasopharyngeal Swab     Status: None   Collection Time: 08/26/19 12:57 AM   Specimen: Nasopharyngeal Swab  Result Value Ref Range Status   SARS Coronavirus 2 NEGATIVE NEGATIVE Final    Comment: (NOTE) SARS-CoV-2 target nucleic acids are NOT DETECTED. The SARS-CoV-2 RNA is generally detectable in upper and lower respiratory specimens during the acute phase of infection. Negative results do not preclude SARS-CoV-2 infection, do not rule out co-infections with other pathogens, and should not be used as the sole basis for treatment or other patient management decisions. Negative results must be combined with clinical observations,  patient history, and epidemiological information. The expected result is Negative. Fact Sheet for Patients: SugarRoll.be Fact Sheet for Healthcare Providers: https://www.woods-mathews.com/ This test is not yet approved or cleared by the [place] FDA and  has been authorized for detection and/or diagnosis of SARS-CoV-2 by FDA under an Emergency Use Authorization (EUA). This EUA will remain  in effect (meaning this test can be used) for the duration of the COVID-19 declaration under Section 56 4(b)(1) of the Act, 21 U.S.C. section 360bbb-3(b)(1), unless the authorization is terminated or revoked sooner. Performed at [facility], [place] [address] [place], [place] [address]          Radiology Studies: Ct Abdomen Pelvis W Contrast  Result Date: 08/25/2019 CLINICAL DATA:  Acute abdominal pain. Flank pain and right lower quadrant pain. EXAM: CT ABDOMEN AND PELVIS WITH CONTRAST TECHNIQUE: Multidetector CT imaging of the abdomen and pelvis was performed using the standard protocol following bolus administration of intravenous contrast. CONTRAST:  141mL OMNIPAQUE IOHEXOL 300 MG/ML  SOLN COMPARISON:  CT 05/08/2019 FINDINGS: Lower chest: Acute airspace disease. Normal heart size with coronary artery calcifications. No pleural fluid. Hepatobiliary: No focal hepatic abnormality. Clips in the gallbladder fossa postcholecystectomy. No biliary dilatation. Pancreas: No ductal dilatation or inflammation. Spleen: Normal in size without focal abnormality. Adrenals/Urinary Tract: Normal adrenal glands. Heterogeneous right renal enhancement with areas of decreased enhancement, particularly in the upper pole. Mild right perinephric edema. No hydronephrosis. No focal renal fluid collection. Mild urinary bladder wall thickening and perivesicular stranding. Small cysts in the left kidney which is otherwise unremarkable. No left hydronephrosis. Stomach/Bowel:  Nondistended stomach. No small bowel inflammation, wall thickening or obstruction. Normal appendix, series 2, image 50. Scattered high-density ingested material throughout the colon. Moderate colonic stool burden. No colonic wall thickening. No pericolonic edema. Previous soft tissue prominence in the region of the posterior rectum is not definitively seen. Vascular/Lymphatic: Moderate to advanced aortic atherosclerosis. No aneurysm. Portal vein is patent. No adenopathy. Reproductive: Retroverted uterus. No adnexal mass. Other: No free air, free fluid, or intra-abdominal fluid collection. Small fat containing infraumbilical paramedian ventral abdominal wall hernia contains only fat. Musculoskeletal: There are no acute or suspicious osseous abnormalities. Degenerative change in the spine, prominent degenerative disc disease at L3-L4 appears similar. IMPRESSION: 1. Findings consistent with right pyelonephritis.  No renal abscess. 2. No other acute findings.  Normal appendix. Aortic Atherosclerosis (ICD10-I70.0). Electronically Signed   By: Keith Rake M.D.   On: 08/25/2019 23:51   Dg Chest Portable 1 View  Result Date: 08/25/2019 CLINICAL DATA:  Fevers EXAM: PORTABLE CHEST 1 VIEW COMPARISON:  07/09/2015 FINDINGS: Cardiac shadow is stable. The lungs are well aerated bilaterally. Postsurgical changes are noted in the right breast. No focal infiltrate or sizable effusion is seen. No bony abnormality is noted. IMPRESSION: No acute abnormality noted. Electronically Signed   By: Inez Catalina M.D.   On: 08/25/2019 22:22        Scheduled Meds: . enoxaparin (LOVENOX) injection  40 mg Subcutaneous Q24H  . insulin aspart  0-5 Units Subcutaneous QHS  . insulin aspart  0-9 Units Subcutaneous TID WC  . simvastatin  20 mg Oral QHS  . tamoxifen  20 mg Oral Daily   Continuous Infusions: . sodium chloride 75 mL/hr at 08/26/19 0826  . meropenem (MERREM) IV 1 g (08/26/19 0827)     LOS: 0 days    Time spent:  25 minutes    Blain Pais, MD [facility]   If 7PM-7AM, please contact night-coverage [email or web address] Password [email or web address] 08/26/2019, 1:35 PM

## 2019-08-26 NOTE — H&P (Signed)
History and Physical    Destiny Moody KJZ:[id number] DOB: [birth year] DOA: 08/25/2019  PCP: Donald Prose, MD  Patient coming from: Home  I have personally briefly reviewed patient's old medical records in [place]  Chief Complaint: Right flank pain with dysuria  HPI: Destiny Moody is a 68 y.o. female with medical history significant for type 2 diabetes, hyperlipidemia, history of breast cancer, and osteoporosis who presents to the ED for evaluation of dysuria with worsening right flank pain.  Patient reports about 4 days of dysuria with increased urinary frequency.  She has had associated right flank and right lower quadrant abdominal pain.  She reports some nausea, vomiting, chills, and diaphoresis but denies subjective fever.  She denies any chest pain, dyspnea, cough.  Patient had prior admission in August 2020 for ESBL E. coli bacteremia secondary to UTI/pyelonephritis.  ED Course:  Initial vitals showed BP 114/56, pulse 88, RR 16, temp 98.0 Fahrenheit, SPO2 100% on room air.  Repeat vitals notable for temp 100.3 Fahrenheit, pulse 118, RR 22.  Labs are notable for WBC 15.4, hemoglobin 12.9, platelets 258,000, sodium 131, potassium 3.9, bicarb 21, BUN 16, creatinine 0.76, serum glucose 251, lipase 28, lactic acid 1.4.  Urinalysis showed negative nitrites, large leukocytes, 6-10 RBCs, > 50 WBCs, few bacteria on microscopy.  Blood and urine cultures were obtained and pending.  Portable chest x-ray was without focal consolidation, effusion, or edema.  CT abdomen/pelvis with contrast showed findings consistent with right pyelonephritis without evidence of renal abscess.  No other acute findings noted.  Patient was given 1 L lactated Ringer's and IV ceftriaxone.  The hospitalist service was consulted for further evaluation and management.  Review of Systems: All systems reviewed and are negative except as documented in history of present illness above.   Past Medical History:   Diagnosis Date  . Allergy   . Arthritis   . Breast cancer ([place]) 08/24/14   right, upper inner  . Cancer ([place])   . Chronic kidney disease   . Depression    no meds  . HSV (herpes simplex virus) anogenital infection 2018  . Hypercholesterolemia   . Hypertension   . Hypothyroidism   . Multinodular thyroid   . NIDDM (non-insulin dependent diabetes mellitus)   . Osteoporosis 2020   T score -2.5 stable from prior DEXA  . Radiation 01/31/15-03/01/15   right  breast  . Rheumatoid arthritis(714.0)    lower back  . Wears contact lenses     Past Surgical History:  Procedure Laterality Date  . BREAST LUMPECTOMY Right 2016  . BREAST SURGERY     Lumpectomy  . CESAREAN SECTION  9480,[id number]   x 2  . CHOLECYSTECTOMY  1993  . COLONOSCOPY  last 06/15/2016   greater than 12 years but not sure where colonoscopy was performed  . INCISION AND DRAINAGE ABSCESS Right 11/24/2014   Procedure: INCISION AND DRAINAGE RIGHT AXILLA  ABSCESS;  Surgeon: Fanny Skates, MD;  Location: [facility];  Service: General;  Laterality: Right;  . RADIOACTIVE SEED GUIDED PARTIAL MASTECTOMY WITH AXILLARY SENTINEL LYMPH NODE BIOPSY Right 10/12/2014   Procedure: RIGHT  PARTIAL MASTECTOMY WITH RADIOACTIVE SEED LOCALIZATION  RIGHT  AXILLARY SENTINEL  NODE BIOPSY;  Surgeon: Fanny Skates, MD;  Location: [facility];  Service: General;  Laterality: Right;  . TUBAL LIGATION  1996   re annastomosis  . WISDOM TOOTH EXTRACTION      Social History:  reports that she has never smoked. She has never used  smokeless tobacco. She reports previous alcohol use. She reports that she does not use drugs.  Allergies  Allergen Reactions  . Shellfish Allergy Hives    Family History  Problem Relation Age of Onset  . Diabetes Mother        niddm  . Heart disease Mother   . Heart failure Father   . Heart disease Father   . Ovarian cancer Cousin 20  . Hyperlipidemia Sister   . Colon cancer Maternal Uncle 52  . Esophageal  cancer Neg Hx   . Rectal cancer Neg Hx   . Stomach cancer Neg Hx   . Colon polyps Neg Hx      Prior to Admission medications   Medication Sig Start Date End Date Taking? Authorizing Provider  amitriptyline (ELAVIL) 25 MG tablet TAKE ONE TABLET BY MOUTH AT BEDTIME  "OFFICE VISIT NEEDED" 10/18/15   Copland, Gay Filler, MD  aspirin 81 MG tablet Take 81 mg by mouth daily.    [provider]  Blood Glucose Monitoring Suppl ([place]) w/Device KIT  06/03/19   [provider]  Calcium Carbonate-Vitamin D 600-200 MG-UNIT TABS Take 1,200 mg by mouth daily.     [provider]  clindamycin (CLEOCIN) 2 % vaginal cream Place 1 Applicatorful vaginally at bedtime. 07/16/19   Fontaine, Belinda Block, MD  Glucosamine 500 MG CAPS Take 1 capsule by mouth daily.    [provider]  hydrocortisone (ANUSOL-HC) 2.5 % rectal cream Place 1 application rectally 2 (two) times daily. Make sure cream is inside. 05/21/19   Willia Craze, NP  ibuprofen (ADVIL,MOTRIN) 200 MG tablet Take 200 mg by mouth every 6 (six) hours as needed for mild pain or moderate pain. Reported on 01/24/2016    [provider]  liraglutide (VICTOZA) 18 MG/3ML SOPN Inject 1.2 mg into the skin daily.    [provider]  metFORMIN (GLUCOPHAGE-XR) 500 MG 24 hr tablet TAKE TWO TABLETS BY MOUTH TWICE DAILY Patient taking differently: Take 2,000 mg by mouth every morning.  10/28/16   Forrest Moron, MD  Multiple Vitamin (MULTIVITAMIN) capsule Take 1 capsule by mouth daily.    [provider]  Omega-3 Fatty Acids (OMEGA-3 FISH OIL PO) Take by mouth.    [provider]  risedronate (ACTONEL) 150 MG tablet Take 1 tablet (150 mg total) by mouth every 30 (thirty) days. with water on empty stomach, nothing by mouth or lie down for next 30 minutes. 02/02/19   Fontaine, Belinda Block, MD  simvastatin (ZOCOR) 20 MG tablet Take 1 tablet (20 mg total) by mouth at bedtime. 10/18/15    Copland, Gay Filler, MD  tamoxifen (NOLVADEX) 20 MG tablet Take 1 tablet (20 mg total) by mouth daily. 02/26/19   Magrinat, Virgie Dad, MD  triamcinolone cream (KENALOG) 0.1 % APP EXT AA BID PRN 03/26/19   [provider]  TURMERIC PO Take 1 capsule by mouth daily.    [provider]    Physical Exam: Vitals:   08/25/19 1411 08/25/19 1429 08/25/19 1947 08/25/19 2147  BP: (!) 114/56  109/64 134/68  Pulse: 88  (!) 118 (!) 119  Resp: '16  17 18  '$ Temp: 98 F (36.7 C)  100.3 F (37.9 C)   TempSrc: Oral  Oral   SpO2: 100%  98% 99%  Weight:  61.2 kg    Height:  '5\' 2"'$  (1.575 m)      Constitutional: Resting supine in bed, NAD, calm, tired but  appears comfortable Eyes: PERRL, lids and conjunctivae normal ENMT: Mucous membranes are moist. Posterior pharynx clear of any exudate or lesions.Normal dentition.  Neck: normal, supple, no masses. Respiratory: clear to auscultation bilaterally, no wheezing, no crackles. Normal respiratory effort. No accessory muscle use.  Cardiovascular: Regular rate and rhythm, no murmurs / rubs / gallops. No extremity edema. 2+ pedal pulses. Abdomen: Mild suprapubic tenderness, no masses palpated. No hepatosplenomegaly. Bowel sounds positive.  Musculoskeletal: no clubbing / cyanosis. No joint deformity upper and lower extremities. Good ROM, no contractures. Normal muscle tone.  Skin: Diaphoretic, no rashes, lesions, ulcers. No induration Neurologic: CN 2-12 grossly intact. Sensation intact, Strength 5/5 in all 4.  Psychiatric: Normal judgment and insight. Alert and oriented x 3. Normal mood.   Labs on Admission: I have personally reviewed following labs and imaging studies  CBC: Recent Labs  Lab 08/25/19 2110  WBC 15.4*  HGB 12.9  HCT 38.2  MCV 94.6  PLT 409   Basic Metabolic Panel: Recent Labs  Lab 08/25/19 2110  NA 131*  K 3.9  CL 97*  CO2 21*  GLUCOSE 251*  BUN 16  CREATININE 0.76  CALCIUM 8.9   GFR: Estimated Creatinine  Clearance: 59.5 mL/min (by C-G formula based on SCr of 0.76 mg/dL). Liver Function Tests: Recent Labs  Lab 08/25/19 2110  AST 34  ALT 49*  ALKPHOS 52  BILITOT 0.8  PROT 8.0  ALBUMIN 3.8   Recent Labs  Lab 08/25/19 2110  LIPASE 28   No results for input(s): AMMONIA in the last 168 hours. Coagulation Profile: No results for input(s): INR, PROTIME in the last 168 hours. Cardiac Enzymes: No results for input(s): CKTOTAL, CKMB, CKMBINDEX, TROPONINI in the last 168 hours. BNP (last 3 results) No results for input(s): PROBNP in the last 8760 hours. HbA1C: No results for input(s): HGBA1C in the last 72 hours. CBG: Recent Labs  Lab 08/25/19 1550  GLUCAP 211*   Lipid Profile: No results for input(s): CHOL, HDL, LDLCALC, TRIG, CHOLHDL, LDLDIRECT in the last 72 hours. Thyroid Function Tests: No results for input(s): TSH, T4TOTAL, FREET4, T3FREE, THYROIDAB in the last 72 hours. Anemia Panel: No results for input(s): VITAMINB12, FOLATE, FERRITIN, TIBC, IRON, RETICCTPCT in the last 72 hours. Urine analysis:    Component Value Date/Time   COLORURINE YELLOW 08/25/2019 1952   APPEARANCEUR CLOUDY (A) 08/25/2019 1952   LABSPEC 1.016 08/25/2019 1952   PHURINE 5.0 08/25/2019 1952   GLUCOSEU >=500 (A) 08/25/2019 1952   HGBUR SMALL (A) 08/25/2019 1952   BILIRUBINUR NEGATIVE 08/25/2019 1952   BILIRUBINUR negative 09/27/2015 2103   KETONESUR 80 (A) 08/25/2019 1952   PROTEINUR 100 (A) 08/25/2019 1952   UROBILINOGEN 0.2 09/27/2015 2103   UROBILINOGEN 0.2 04/19/2008 1937   NITRITE NEGATIVE 08/25/2019 1952   LEUKOCYTESUR LARGE (A) 08/25/2019 1952    Radiological Exams on Admission: Ct Abdomen Pelvis W Contrast  Result Date: 08/25/2019 CLINICAL DATA:  Acute abdominal pain. Flank pain and right lower quadrant pain. EXAM: CT ABDOMEN AND PELVIS WITH CONTRAST TECHNIQUE: Multidetector CT imaging of the abdomen and pelvis was performed using the standard protocol following bolus administration  of intravenous contrast. CONTRAST:  155m OMNIPAQUE IOHEXOL 300 MG/ML  SOLN COMPARISON:  CT 05/08/2019 FINDINGS: Lower chest: Acute airspace disease. Normal heart size with coronary artery calcifications. No pleural fluid. Hepatobiliary: No focal hepatic abnormality. Clips in the gallbladder fossa postcholecystectomy. No biliary dilatation. Pancreas: No ductal dilatation or inflammation. Spleen: Normal in size without focal abnormality. Adrenals/Urinary Tract: Normal adrenal  glands. Heterogeneous right renal enhancement with areas of decreased enhancement, particularly in the upper pole. Mild right perinephric edema. No hydronephrosis. No focal renal fluid collection. Mild urinary bladder wall thickening and perivesicular stranding. Small cysts in the left kidney which is otherwise unremarkable. No left hydronephrosis. Stomach/Bowel: Nondistended stomach. No small bowel inflammation, wall thickening or obstruction. Normal appendix, series 2, image 50. Scattered high-density ingested material throughout the colon. Moderate colonic stool burden. No colonic wall thickening. No pericolonic edema. Previous soft tissue prominence in the region of the posterior rectum is not definitively seen. Vascular/Lymphatic: Moderate to advanced aortic atherosclerosis. No aneurysm. Portal vein is patent. No adenopathy. Reproductive: Retroverted uterus. No adnexal mass. Other: No free air, free fluid, or intra-abdominal fluid collection. Small fat containing infraumbilical paramedian ventral abdominal wall hernia contains only fat. Musculoskeletal: There are no acute or suspicious osseous abnormalities. Degenerative change in the spine, prominent degenerative disc disease at L3-L4 appears similar. IMPRESSION: 1. Findings consistent with right pyelonephritis.  No renal abscess. 2. No other acute findings.  Normal appendix. Aortic Atherosclerosis (ICD10-I70.0). Electronically Signed   By: Keith Rake M.D.   On: 08/25/2019 23:51    Dg Chest Portable 1 View  Result Date: 08/25/2019 CLINICAL DATA:  Fevers EXAM: PORTABLE CHEST 1 VIEW COMPARISON:  07/09/2015 FINDINGS: Cardiac shadow is stable. The lungs are well aerated bilaterally. Postsurgical changes are noted in the right breast. No focal infiltrate or sizable effusion is seen. No bony abnormality is noted. IMPRESSION: No acute abnormality noted. Electronically Signed   By: Inez Catalina M.D.   On: 08/25/2019 22:22    EKG: Not performed.  Assessment/Plan Principal Problem:   Sepsis due to urinary tract infection (HCC) Active Problems:   Diabetes mellitus type II, non insulin dependent (HCC)   Hyperlipidemia associated with type 2 diabetes mellitus (HCC)  Destiny Moody is a 68 y.o. female with medical history significant for type 2 diabetes, hyperlipidemia, history of breast cancer, and osteoporosis who is admitted with sepsis due to UTI/right-sided pyelonephritis.  Sepsis due to UTI/right-sided pyelonephritis: Given recent history of ESBL E. coli bacteremia/UTI, will switch antibiotics to meropenem pending further culture data. -Start IV meropenem 1g q8h -Follow-up blood and urine cultures -Continue as needed pain control and antiemetics  Hyperglycemia in type 2 diabetes: Hyperglycemic in setting of acute infection.  Home regimen includes Metformin and Victoza. -Start sensitive SSI plus HS coverage while in hospital, adjust as needed  Hyperlipidemia: -Continue home simvastatin  History of breast cancer: S/p right lumpectomy.  Follows with oncology, Dr. Jana Hakim, and currently on antihormonal therapy. -Continue tamoxifen  DVT prophylaxis: Lovenox Code Status: Full code, confirmed with patient Family Communication: Discussed with patient, she has discussed with family Disposition Plan: Likely discharge to home pending clinical progress Consults called: None Admission status: Observation   Zada Finders MD [facility]  If 7PM-7AM, please contact  night-coverage [email or web address]  08/26/2019, 12:21 AM

## 2019-08-26 NOTE — Progress Notes (Signed)
PHARMACY - PHYSICIAN COMMUNICATION CRITICAL VALUE ALERT - BLOOD CULTURE IDENTIFICATION (BCID)  Destiny Moody is an 68 y.o. female who presented to [facility] on 08/25/2019 with a chief complaint of right flank pain with dysuria  Assessment:  1 of 4 bottles from blood cultures - [name]  Name of physician (or Provider) Contacted: Hayes Ludwig  Current antibiotics: meropenem d/t hx ESBL infection  Changes to prescribed antibiotics recommended:  No changes recommended  Results for orders placed or performed during the hospital encounter of 08/25/19  Blood Culture ID Panel (Reflexed) (Collected: 08/25/2019 10:44 PM)  Result Value Ref Range   Enterococcus species NOT DETECTED NOT DETECTED   Listeria monocytogenes NOT DETECTED NOT DETECTED   Staphylococcus species NOT DETECTED NOT DETECTED   Staphylococcus aureus (BCID) NOT DETECTED NOT DETECTED   Streptococcus species NOT DETECTED NOT DETECTED   Streptococcus agalactiae NOT DETECTED NOT DETECTED   Streptococcus pneumoniae NOT DETECTED NOT DETECTED   Streptococcus pyogenes NOT DETECTED NOT DETECTED   Acinetobacter baumannii NOT DETECTED NOT DETECTED   Enterobacteriaceae species DETECTED (A) NOT DETECTED   Enterobacter cloacae complex NOT DETECTED NOT DETECTED   Escherichia coli DETECTED (A) NOT DETECTED   Klebsiella oxytoca NOT DETECTED NOT DETECTED   Klebsiella pneumoniae NOT DETECTED NOT DETECTED   Proteus species NOT DETECTED NOT DETECTED   Serratia marcescens NOT DETECTED NOT DETECTED   Carbapenem resistance NOT DETECTED NOT DETECTED   Haemophilus influenzae NOT DETECTED NOT DETECTED   Neisseria meningitidis NOT DETECTED NOT DETECTED   Pseudomonas aeruginosa NOT DETECTED NOT DETECTED   Candida albicans NOT DETECTED NOT DETECTED   Candida glabrata NOT DETECTED NOT DETECTED   Candida krusei NOT DETECTED NOT DETECTED   Candida parapsilosis NOT DETECTED NOT DETECTED   Candida tropicalis NOT DETECTED NOT DETECTED    [name] 08/26/2019  6:35 PM

## 2019-08-26 NOTE — Progress Notes (Signed)
MD paged for fluid orders.

## 2019-08-26 NOTE — ED Notes (Signed)
ED TO INPATIENT HANDOFF REPORT  Name/Age/Gender Destiny Moody 68 y.o. female  Code Status Code Status History    Date Active Date Inactive Code Status Order ID Comments User Context   05/08/2019 0231 05/13/2019 2131 Full Code [id number]  Reubin Milan, MD ED   Advance Care Planning Activity      Home/SNF/Other Home  Chief Complaint blood and kidney pain  Level of Care/Admitting Diagnosis ED Disposition    ED Disposition Condition [facility] Area: [place] [100102]  Level of Care: Med-Surg [16]  Covid Evaluation: Asymptomatic Screening Protocol (No Symptoms)  Diagnosis: Sepsis due to urinary tract infection [facility]) [id number]  Admitting Physician: Lenore Cordia [id number]  Attending Physician: Lenore Cordia [id number]  PT Class (Do Not Modify): Observation [104]  PT Acc Code (Do Not Modify): Observation [10022]       Medical History Past Medical History:  Diagnosis Date  . Allergy   . Arthritis   . Breast cancer ([place]) 08/24/14   right, upper inner  . Cancer ([place])   . Chronic kidney disease   . Depression    no meds  . HSV (herpes simplex virus) anogenital infection 2018  . Hypercholesterolemia   . Hypertension   . Hypothyroidism   . Multinodular thyroid   . NIDDM (non-insulin dependent diabetes mellitus)   . Osteoporosis 2020   T score -2.5 stable from prior DEXA  . Radiation 01/31/15-03/01/15   right  breast  . Rheumatoid arthritis(714.0)    lower back  . Wears contact lenses     Allergies Allergies  Allergen Reactions  . Shellfish Allergy Hives    IV Location/Drains/Wounds Patient Lines/Drains/Airways Status   Active Line/Drains/Airways    Name:   Placement date:   Placement time:   Site:   Days:   Peripheral IV 06/29/19 Left Antecubital   06/29/19    1447    Antecubital   58   Peripheral IV 08/25/19 Left Antecubital   08/25/19    2241    Antecubital   1   Peripheral IV 08/25/19 Left Wrist   08/25/19    2241     Wrist   1   PICC Single Lumen [id number] PICC Left Basilic 39 cm 0 cm   [id number]    [id number]    Basilic   [id number]   External Urinary Catheter   05/11/19    1246    -   107          Labs/Imaging Results for orders placed or performed during the hospital encounter of 08/25/19 (from the past 48 hour(s))  CBG monitoring, ED     Status: Abnormal   Collection Time: 08/25/19  3:50 PM  Result Value Ref Range   Glucose-Capillary 211 (H) 70 - 99 mg/dL   Comment 1 Notify RN   Urinalysis, Routine w reflex microscopic- may I&O cath if menses     Status: Abnormal   Collection Time: 08/25/19  7:52 PM  Result Value Ref Range   Color, Urine YELLOW YELLOW   APPearance CLOUDY (A) CLEAR   Specific Gravity, Urine 1.016 1.005 - 1.030   pH 5.0 5.0 - 8.0   Glucose, UA >=500 (A) NEGATIVE mg/dL   Hgb urine dipstick SMALL (A) NEGATIVE   Bilirubin Urine NEGATIVE NEGATIVE   Ketones, ur 80 (A) NEGATIVE mg/dL   Protein, ur 100 (A) NEGATIVE mg/dL   Nitrite NEGATIVE NEGATIVE   Leukocytes,Ua LARGE (A) NEGATIVE   RBC /  HPF 6-10 0 - 5 RBC/hpf   WBC, UA >50 (H) 0 - 5 WBC/hpf   Bacteria, UA FEW (A) NONE SEEN   Squamous Epithelial / LPF 0-5 0 - 5   WBC Clumps PRESENT    Mucus PRESENT    Non Squamous Epithelial 21-50 (A) NONE SEEN    Comment: Performed at [facility], [place] [address] [place], [place] [address]  Urine C&S     Status: None (Preliminary result)   Collection Time: 08/25/19  7:52 PM   Specimen: Urine, Clean Catch  Result Value Ref Range   Specimen Description      URINE, CLEAN CATCH Performed at [facility], [place] [address], [place], [place] [address]    Special Requests      NONE Performed at [facility], [place] [address] [place], [place] [address]    Culture PENDING    Report Status PENDING   Basic metabolic panel     Status: Abnormal   Collection Time: 08/25/19  9:10 PM  Result Value Ref Range   Sodium 131 (L) 135 - 145 mmol/L   Potassium 3.9 3.5 -  5.1 mmol/L   Chloride 97 (L) 98 - 111 mmol/L   CO2 21 (L) 22 - 32 mmol/L   Glucose, Bld 251 (H) 70 - 99 mg/dL   BUN 16 8 - 23 mg/dL   Creatinine, Ser 0.76 0.44 - 1.00 mg/dL   Calcium 8.9 8.9 - 10.3 mg/dL   GFR calc non Af Amer >60 >60 mL/min   GFR calc Af Amer >60 >60 mL/min   Anion gap 13 5 - 15    Comment: Performed at [facility], [place] [address], [place], [place] [address]  CBC     Status: Abnormal   Collection Time: 08/25/19  9:10 PM  Result Value Ref Range   WBC 15.4 (H) 4.0 - 10.5 K/uL   RBC 4.04 3.87 - 5.11 MIL/uL   Hemoglobin 12.9 12.0 - 15.0 g/dL   HCT 38.2 36.0 - 46.0 %   MCV 94.6 80.0 - 100.0 fL   MCH 31.9 26.0 - 34.0 pg   MCHC 33.8 30.0 - 36.0 g/dL   RDW 12.5 11.5 - 15.5 %   Platelets 258 150 - 400 K/uL   nRBC 0.0 0.0 - 0.2 %    Comment: Performed at [facility], [place] [address], [place], [place] [address]  Hepatic function panel     Status: Abnormal   Collection Time: 08/25/19  9:10 PM  Result Value Ref Range   Total Protein 8.0 6.5 - 8.1 g/dL   Albumin 3.8 3.5 - 5.0 g/dL   AST 34 15 - 41 U/L   ALT 49 (H) 0 - 44 U/L   Alkaline Phosphatase 52 38 - 126 U/L   Total Bilirubin 0.8 0.3 - 1.2 mg/dL   Bilirubin, Direct 0.2 0.0 - 0.2 mg/dL   Indirect Bilirubin 0.6 0.3 - 0.9 mg/dL    Comment: Performed at [facility], [place] [address] [place], [place] [address]  Lipase, blood     Status: None   Collection Time: 08/25/19  9:10 PM  Result Value Ref Range   Lipase 28 11 - 51 U/L    Comment: Performed at [facility], [place] [name] [place], [place] [address]  Lactic acid, plasma     Status: None   Collection Time: 08/25/19  9:47 PM  Result Value Ref Range  Lactic Acid, Venous 1.4 0.5 - 1.9 mmol/L    Comment: Performed at [facility], [place] [address], [place], [place] [address]   Ct Abdomen Pelvis W Contrast  Result Date: 08/25/2019 CLINICAL DATA:  Acute abdominal pain.  Flank pain and right lower quadrant pain. EXAM: CT ABDOMEN AND PELVIS WITH CONTRAST TECHNIQUE: Multidetector CT imaging of the abdomen and pelvis was performed using the standard protocol following bolus administration of intravenous contrast. CONTRAST:  154mL OMNIPAQUE IOHEXOL 300 MG/ML  SOLN COMPARISON:  CT 05/08/2019 FINDINGS: Lower chest: Acute airspace disease. Normal heart size with coronary artery calcifications. No pleural fluid. Hepatobiliary: No focal hepatic abnormality. Clips in the gallbladder fossa postcholecystectomy. No biliary dilatation. Pancreas: No ductal dilatation or inflammation. Spleen: Normal in size without focal abnormality. Adrenals/Urinary Tract: Normal adrenal glands. Heterogeneous right renal enhancement with areas of decreased enhancement, particularly in the upper pole. Mild right perinephric edema. No hydronephrosis. No focal renal fluid collection. Mild urinary bladder wall thickening and perivesicular stranding. Small cysts in the left kidney which is otherwise unremarkable. No left hydronephrosis. Stomach/Bowel: Nondistended stomach. No small bowel inflammation, wall thickening or obstruction. Normal appendix, series 2, image 50. Scattered high-density ingested material throughout the colon. Moderate colonic stool burden. No colonic wall thickening. No pericolonic edema. Previous soft tissue prominence in the region of the posterior rectum is not definitively seen. Vascular/Lymphatic: Moderate to advanced aortic atherosclerosis. No aneurysm. Portal vein is patent. No adenopathy. Reproductive: Retroverted uterus. No adnexal mass. Other: No free air, free fluid, or intra-abdominal fluid collection. Small fat containing infraumbilical paramedian ventral abdominal wall hernia contains only fat. Musculoskeletal: There are no acute or suspicious osseous abnormalities. Degenerative change in the spine, prominent degenerative disc disease at L3-L4 appears similar. IMPRESSION: 1. Findings  consistent with right pyelonephritis.  No renal abscess. 2. No other acute findings.  Normal appendix. Aortic Atherosclerosis (ICD10-I70.0). Electronically Signed   By: Keith Rake M.D.   On: 08/25/2019 23:51   Dg Chest Portable 1 View  Result Date: 08/25/2019 CLINICAL DATA:  Fevers EXAM: PORTABLE CHEST 1 VIEW COMPARISON:  07/09/2015 FINDINGS: Cardiac shadow is stable. The lungs are well aerated bilaterally. Postsurgical changes are noted in the right breast. No focal infiltrate or sizable effusion is seen. No bony abnormality is noted. IMPRESSION: No acute abnormality noted. Electronically Signed   By: Inez Catalina M.D.   On: 08/25/2019 22:22    Pending Labs Unresulted Labs (From admission, onward)    Start     Ordered   08/26/19 0043  SARS CORONAVIRUS 2 (TAT 6-24 HRS) Nasopharyngeal Nasopharyngeal Swab  (Asymptomatic/Tier 3)  Once,   STAT    Question Answer Comment  Is this test for diagnosis or screening Screening   Symptomatic for COVID-19 as defined by CDC No   Hospitalized for COVID-19 No   Admitted to ICU for COVID-19 No   Previously tested for COVID-19 Yes   Resident in a congregate (group) care setting No   Employed in healthcare setting Unknown   Pregnant No      08/26/19 0042   08/25/19 2148  Blood culture (routine x 2)  BLOOD CULTURE X 2,   STAT     08/25/19 2148          Vitals/Pain Today's Vitals   08/25/19 1411 08/25/19 1429 08/25/19 1947 08/25/19 2147  BP: (!) 114/56  109/64 134/68  Pulse: 88  (!) 118 (!) 119  Resp: 16  17 18   Temp: 98 F (36.7 C)  100.3  F (37.9 C)   TempSrc: Oral  Oral   SpO2: 100%  98% 99%  Weight:  61.2 kg    Height:  5\' 2"  (1.575 m)    PainSc:  10-Worst pain ever      Isolation Precautions No active isolations  Medications Medications  cefTRIAXone (ROCEPHIN) 1 g in sodium chloride 0.9 % 100 mL IVPB (0 g Intravenous Stopped 08/26/19 0001)  lactated ringers bolus 1,000 mL (1,000 mLs Intravenous New Bag/Given 08/25/19 2253)   acetaminophen (TYLENOL) tablet 1,000 mg (1,000 mg Oral Given 08/25/19 2242)  ondansetron (ZOFRAN) injection 4 mg (4 mg Intravenous Given 08/25/19 2243)  fentaNYL (SUBLIMAZE) injection 50 mcg (50 mcg Intravenous Given 08/25/19 2243)  iohexol (OMNIPAQUE) 300 MG/ML solution 100 mL (100 mLs Intravenous Contrast Given 08/25/19 2312)    Mobility walks

## 2019-08-26 NOTE — Progress Notes (Signed)
Inpatient Diabetes Program Recommendations  AACE/ADA: New Consensus Statement on Inpatient Glycemic Control (2015)  Target Ranges:  Prepandial:   less than 140 mg/dL      Peak postprandial:   less than 180 mg/dL (1-2 hours)      Critically ill patients:  140 - 180 mg/dL   Lab Results  Component Value Date   GLUCAP 307 (H) 08/26/2019   HGBA1C 7.0 (H) 08/26/2019    Review of Glycemic Control  Results for Destiny Moody, Destiny Moody (MRN [id number]) as of 08/26/2019 14:30  Ref. Range 08/25/2019 15:50 08/26/2019 02:33 08/26/2019 07:52 08/26/2019 11:44  Glucose-Capillary Latest Ref Range: 70 - 99 mg/dL 211 (H) 220 (H) 198 (H) 307 (H)    Diabetes history: DM2 Outpatient Diabetes medications: Victoza 1.2 mg QD; Amaryl 2 mg QD; Metformin 2000mg  QD Current orders for Inpatient glycemic control: Novolog 0-9 TID with meals & 0-5 HS  Inpatient Diabetes Program Recommendations:     -Please consider adding meal coverage 3 units TID with meals if eats at least 50%  Thank you, Geoffry Paradise, RN, BSN Diabetes Coordinator Inpatient Diabetes Program [phone] (team pager from 8a-5p)

## 2019-08-27 ENCOUNTER — Telehealth: Payer: Self-pay | Admitting: *Deleted

## 2019-08-27 LAB — GLUCOSE, CAPILLARY
Glucose-Capillary: 223 mg/dL — ABNORMAL HIGH (ref 70–99)
Glucose-Capillary: 354 mg/dL — ABNORMAL HIGH (ref 70–99)
Glucose-Capillary: 272 mg/dL — ABNORMAL HIGH (ref 70–99)
Glucose-Capillary: 280 mg/dL — ABNORMAL HIGH (ref 70–99)

## 2019-08-27 LAB — URINE CULTURE: Culture: 50000 — AB

## 2019-08-27 MED ORDER — INSULIN ASPART 100 UNIT/ML ~~LOC~~ SOLN
3.0000 [IU] | Freq: Three times a day (TID) | SUBCUTANEOUS | Status: DC
  Administered 2019-08-28 – 2019-08-30 (×9): 3 [IU] via SUBCUTANEOUS

## 2019-08-27 MED ORDER — INSULIN GLARGINE 100 UNIT/ML ~~LOC~~ SOLN
6.0000 [IU] | Freq: Every day | SUBCUTANEOUS | Status: DC
  Administered 2019-08-27: 6 [IU] via SUBCUTANEOUS
  Filled 2019-08-27 (×2): qty 0.06

## 2019-08-27 MED ORDER — POLYETHYLENE GLYCOL 3350 17 G PO PACK
17.0000 g | PACK | Freq: Every day | ORAL | Status: DC | PRN
  Administered 2019-08-27: 17 g via ORAL
  Filled 2019-08-27: qty 1

## 2019-08-27 NOTE — Telephone Encounter (Signed)
Patient called from hospital requesting to proceed with urology appointment per note on 07/16/19 "If her urgency symptoms continue then will refer her to urology". I will handle referral, I just wanted to inform you patient admission.

## 2019-08-27 NOTE — Progress Notes (Signed)
Inpatient Diabetes Program Recommendations  AACE/ADA: New Consensus Statement on Inpatient Glycemic Control (2015)  Target Ranges:  Prepandial:   less than 140 mg/dL      Peak postprandial:   less than 180 mg/dL (1-2 hours)      Critically ill patients:  140 - 180 mg/dL   Lab Results  Component Value Date   GLUCAP 223 (H) 08/27/2019   HGBA1C 7.0 (H) 08/26/2019    Review of Glycemic Control  Results for Destiny Moody, Destiny Moody (MRN [id number]) as of 08/27/2019 10:14  Ref. Range 08/26/2019 07:52 08/26/2019 11:44 08/26/2019 15:54 08/26/2019 21:50 08/27/2019 07:24  Glucose-Capillary Latest Ref Range: 70 - 99 mg/dL 198 (H) 307 (H) 267 (H) 327 (H) 223 (H)     Inpatient Diabetes Program Recommendations:  CBG's remain 200-300.  Fasting 223mg /dl.   Patient is eating. Please consider...  - Insulin - Meal Coverage: Novolog 3 units TID with melas if eats at least 50%  - Basal Insulin- Lantus 6 units QHS  Thank you, Geoffry Paradise, RN, BSN Diabetes Coordinator Inpatient Diabetes Program [phone] (team pager from 8a-5p)

## 2019-08-27 NOTE — Progress Notes (Signed)
PROGRESS NOTE    Destiny Moody  [id number] DOB: 10/09/1950 DOA: 08/25/2019 PCP: Donald Prose, MD  Brief Narrative:  Destiny Moody is a 68 y.o. female with medical history significant for type 2 diabetes, hyperlipidemia, history of breast cancer, and osteoporosis who presents to the ED for evaluation of dysuria with worsening right flank pain.  Patient reports about 4 days of dysuria with increased urinary frequency.  She has had associated right flank and right lower quadrant abdominal pain.  She reports some nausea, vomiting, chills, and diaphoresis but denies subjective fever.  She denies any chest pain, dyspnea, cough.  Patient had prior admission in August 2020 for ESBL E. coli bacteremia secondary to UTI/pyelonephritis.  In the ED she was found to have UA consistent with UTI, CT abd/pelvis showed right pyelonephritis without abscess.    Assessment & Plan:   Principal Problem:   Sepsis due to urinary tract infection ([place]) Active Problems:   Diabetes mellitus type II, non insulin dependent ([place])   Hyperlipidemia associated with type 2 diabetes mellitus ([place])   Acute pyelonephritis  Destiny Moody is a 68 y.o. female with medical history significant for type 2 diabetes, hyperlipidemia, history of breast cancer, and osteoporosis who is admitted with sepsis due to UTI/right-sided pyelonephritis.  Sepsis due to UTI/right-sided pyelonephritis poa, and E. Coli bacteremia: She presented with persistent tachycardia, leukocytosis with WBC of 15.4K, fever at home and temp of 100.62F here.  Given recent history of ESBL E. coli bacteremia/UTI, will continue meropenem v Rocephin pending further culture data. -Follow-up blood and urine cultures final results. This has grown E. Coli per prelim report.  -Continue as needed pain control and antiemetics -Patient will need Urology referral in the outpatient setting.  -Repeat blood cultures today.  -Continue meropenem.   Hyperglycemia in type  2 diabetes: Hyperglycemic in setting of acute infection.  Home regimen includes Metformin and Victoza. -Continue SSI plus HS coverage while in hospital, start low dose Lantus hs and Novolog TID for meal coverage, appreciate diabetes coordinator recommendations. Will adjust indulin regimen as needed  Hyperlipidemia: -Continue home simvastatin  History of breast cancer: S/p right lumpectomy.  Follows with oncology, Dr. Jana Hakim, and currently on antihormonal therapy. -Continue tamoxifen  DVT prophylaxis: Lovenox Code Status: Full code Family Communication: Discussed with patient, discussed with her daughter at the bedside.  Disposition Plan: Pending clinical improvement. Consults called: None Admission status: Continue inpatient care.  Procedures:  None Antimicrobials:  Meropenem  Subjective: Patient reports feeling better. Denies dysuria, diarrhea, nausea, or vomiting.    Objective: Vitals:   08/26/19 1346 08/26/19 2109 08/27/19 0506 08/27/19 1412  BP: (!) 108/50 (!) 127/57 (!) 115/47 128/62  Pulse: 87 96 82 (!) 105  Resp: 18 16 16 18   Temp: 98.2 F (36.8 C) 99.7 F (37.6 C) 98 F (36.7 C) 99.2 F (37.3 C)  TempSrc:  Oral Oral Oral  SpO2: 95% 99% 96% 98%  Weight:      Height:        Intake/Output Summary (Last 24 hours) at 08/27/2019 1851 Last data filed at 08/27/2019 1831 Gross per 24 hour  Intake 2175.67 ml  Output 3650 ml  Net -1474.33 ml   Filed Weights   08/25/19 1429  Weight: 61.2 kg    Examination:  General exam: Appears calm and comfortable  Respiratory system: No respiratory distress, no cough. No wheezing.  Cardiovascular system: RRR.  No pedal edema. Gastrointestinal system: Abdomen is nondistended, soft and nontender.  Central nervous system: Alert and oriented  x3. No focal neurological deficits. Extremities: Symmetric 5 x 5 power. Skin: No rashes, lesions or ulcers Psychiatry:  Mood & affect appropriate.     Data Reviewed: I have  personally reviewed following labs and imaging studies  CBC: Recent Labs  Lab 08/25/19 2110 08/26/19 0453 08/26/19 1401  WBC 15.4* 16.1* 14.0*  HGB 12.9 12.8 10.4*  HCT 38.2 37.2 31.2*  MCV 94.6 93.0 94.3  PLT 258 217 [id number]   Basic Metabolic Panel: Recent Labs  Lab 08/25/19 2110 08/26/19 0453 08/26/19 1401  NA 131* 135 131*  K 3.9 3.9 3.6  CL 97* 104 102  CO2 21* 18* 20*  GLUCOSE 251* 196* 317*  BUN 16 12 12   CREATININE 0.76 0.64 0.74  CALCIUM 8.9 8.5* 7.8*   GFR: Estimated Creatinine Clearance: 59.5 mL/min (by C-G formula based on SCr of 0.74 mg/dL). Liver Function Tests: Recent Labs  Lab 08/25/19 2110  AST 34  ALT 49*  ALKPHOS 52  BILITOT 0.8  PROT 8.0  ALBUMIN 3.8   Recent Labs  Lab 08/25/19 2110  LIPASE 28   No results for input(s): AMMONIA in the last 168 hours. Coagulation Profile: No results for input(s): INR, PROTIME in the last 168 hours. Cardiac Enzymes: No results for input(s): CKTOTAL, CKMB, CKMBINDEX, TROPONINI in the last 168 hours. BNP (last 3 results) No results for input(s): PROBNP in the last 8760 hours. HbA1C: Recent Labs    08/26/19 0453  HGBA1C 7.0*   CBG: Recent Labs  Lab 08/26/19 1554 08/26/19 2150 08/27/19 0724 08/27/19 1153 08/27/19 1646  GLUCAP 267* 327* 223* 354* 272*   Lipid Profile: No results for input(s): CHOL, HDL, LDLCALC, TRIG, CHOLHDL, LDLDIRECT in the last 72 hours. Thyroid Function Tests: No results for input(s): TSH, T4TOTAL, FREET4, T3FREE, THYROIDAB in the last 72 hours. Anemia Panel: No results for input(s): VITAMINB12, FOLATE, FERRITIN, TIBC, IRON, RETICCTPCT in the last 72 hours. Sepsis Labs: Recent Labs  Lab 08/25/19 2147  LATICACIDVEN 1.4    Recent Results (from the past 240 hour(s))  Urine C&S     Status: Abnormal   Collection Time: 08/25/19  7:52 PM   Specimen: Urine, Clean Catch  Result Value Ref Range Status   Specimen Description   Final    URINE, CLEAN CATCH Performed at [facility], [place] [address], [place], [place] [address]    Special Requests   Final    NONE Performed at [facility], [place] [address], [place], [place] [address]    Culture (A)  Final    50,000 COLONIES/mL ESCHERICHIA COLI Confirmed Extended Spectrum Beta-Lactamase Producer (ESBL).  In bloodstream infections from ESBL organisms, carbapenems are preferred over piperacillin/tazobactam. They are shown to have a lower risk of mortality.    Report Status 08/27/2019 FINAL  Final   Organism ID, Bacteria ESCHERICHIA COLI (A)  Final      Susceptibility   Escherichia coli - MIC*    AMPICILLIN >=32 RESISTANT Resistant     CEFAZOLIN >=64 RESISTANT Resistant     CEFTRIAXONE >=64 RESISTANT Resistant     CIPROFLOXACIN <=0.25 SENSITIVE Sensitive     GENTAMICIN <=1 SENSITIVE Sensitive     IMIPENEM <=0.25 SENSITIVE Sensitive     NITROFURANTOIN 64 INTERMEDIATE Intermediate     TRIMETH/SULFA >=320 RESISTANT Resistant     AMPICILLIN/SULBACTAM 8 SENSITIVE Sensitive     PIP/TAZO <=4 SENSITIVE Sensitive     * 50,000 COLONIES/mL ESCHERICHIA COLI  Blood culture (routine x 2)  Status: None (Preliminary result)   Collection Time: 08/25/19 10:44 PM   Specimen: BLOOD  Result Value Ref Range Status   Specimen Description   Final    BLOOD LEFT ANTECUBITAL Performed at [facility] [address] [place], [place] [address]    Special Requests   Final    BOTTLES DRAWN AEROBIC AND ANAEROBIC Blood Culture adequate volume Performed at [facility] [address], [place], [place] [address]    Culture   Final    NO GROWTH 1 DAY Performed at [facility], [place] [address], [place], [place] [address]    Report Status PENDING  Incomplete  Blood culture (routine x 2)     Status: Abnormal (Preliminary result)   Collection Time: 08/25/19 10:44 PM   Specimen: BLOOD  Result Value Ref Range Status   Specimen Description   Final    BLOOD BLOOD LEFT  WRIST Performed at [facility] [address], [place], [place] [address]    Special Requests   Final    BOTTLES DRAWN AEROBIC AND ANAEROBIC Blood Culture adequate volume Performed at [facility] [place] [address] [place], [place] [address]    Culture  Setup Time   Final    GRAM NEGATIVE RODS AEROBIC BOTTLE ONLY CRITICAL RESULT CALLED TO, READ BACK BY AND VERIFIED WITH: J LEGGE PHARMD 08/26/19 1830 JDW    Culture (A)  Final    ESCHERICHIA COLI SUSCEPTIBILITIES TO FOLLOW Performed at [facility], [place] [address], [place], [place] [address]    Report Status PENDING  Incomplete  Blood Culture ID Panel (Reflexed)     Status: Abnormal   Collection Time: 08/25/19 10:44 PM  Result Value Ref Range Status   Enterococcus species NOT DETECTED NOT DETECTED Final   Listeria monocytogenes NOT DETECTED NOT DETECTED Final   Staphylococcus species NOT DETECTED NOT DETECTED Final   Staphylococcus aureus (BCID) NOT DETECTED NOT DETECTED Final   Streptococcus species NOT DETECTED NOT DETECTED Final   Streptococcus agalactiae NOT DETECTED NOT DETECTED Final   Streptococcus pneumoniae NOT DETECTED NOT DETECTED Final   Streptococcus pyogenes NOT DETECTED NOT DETECTED Final   Acinetobacter baumannii NOT DETECTED NOT DETECTED Final   Enterobacteriaceae species DETECTED (A) NOT DETECTED Final    Comment: Enterobacteriaceae represent a large family of gram-negative bacteria, not a single organism. CRITICAL RESULT CALLED TO, READ BACK BY AND VERIFIED WITH: J LEGGE PHARMD 08/26/19 1830 JDW    Enterobacter cloacae complex NOT DETECTED NOT DETECTED Final   Escherichia coli DETECTED (A) NOT DETECTED Final    Comment: CRITICAL RESULT CALLED TO, READ BACK BY AND VERIFIED WITH: J LEGGE PHARMD 08/26/19 1830 JDW    Klebsiella oxytoca NOT DETECTED NOT DETECTED Final   Klebsiella pneumoniae NOT DETECTED NOT DETECTED Final   Proteus species NOT DETECTED NOT DETECTED Final    Serratia marcescens NOT DETECTED NOT DETECTED Final   Carbapenem resistance NOT DETECTED NOT DETECTED Final   Haemophilus influenzae NOT DETECTED NOT DETECTED Final   Neisseria meningitidis NOT DETECTED NOT DETECTED Final   Pseudomonas aeruginosa NOT DETECTED NOT DETECTED Final   Candida albicans NOT DETECTED NOT DETECTED Final   Candida glabrata NOT DETECTED NOT DETECTED Final   Candida krusei NOT DETECTED NOT DETECTED Final   Candida parapsilosis NOT DETECTED NOT DETECTED Final   Candida tropicalis NOT DETECTED NOT DETECTED Final    Comment: Performed at [facility], [place],  [place] [address]  SARS CORONAVIRUS 2 (TAT 6-24 HRS) Nasopharyngeal Nasopharyngeal Swab     Status: None   Collection Time: 08/26/19 12:57 AM   Specimen: Nasopharyngeal Swab  Result Value Ref Range Status   SARS Coronavirus 2 NEGATIVE NEGATIVE Final    Comment: (NOTE) SARS-CoV-2 target nucleic acids are NOT DETECTED. The SARS-CoV-2 RNA is generally detectable in upper and lower respiratory specimens during the acute phase of infection. Negative results do not preclude SARS-CoV-2 infection, do not rule out co-infections with other pathogens, and should not be used as the sole basis for treatment or other patient management decisions. Negative results must be combined with clinical observations, patient history, and epidemiological information. The expected result is Negative. Fact Sheet for Patients: SugarRoll.be Fact Sheet for Healthcare Providers: https://www.woods-mathews.com/ This test is not yet approved or cleared by the [place] FDA and  has been authorized for detection and/or diagnosis of SARS-CoV-2 by FDA under an Emergency Use Authorization (EUA). This EUA will remain  in effect (meaning this test can be used) for the duration of the COVID-19 declaration under Section 56 4(b)(1) of the Act, 21 U.S.C. section 360bbb-3(b)(1),  unless the authorization is terminated or revoked sooner. Performed at [facility], [place] [address], [place],  [address]          Radiology Studies: CT ABDOMEN PELVIS W CONTRAST  Result Date: 08/25/2019 CLINICAL DATA:  Acute abdominal pain. Flank pain and right lower quadrant pain. EXAM: CT ABDOMEN AND PELVIS WITH CONTRAST TECHNIQUE: Multidetector CT imaging of the abdomen and pelvis was performed using the standard protocol following bolus administration of intravenous contrast. CONTRAST:  134mL OMNIPAQUE IOHEXOL 300 MG/ML  SOLN COMPARISON:  CT 05/08/2019 FINDINGS: Lower chest: Acute airspace disease. Normal heart size with coronary artery calcifications. No pleural fluid. Hepatobiliary: No focal hepatic abnormality. Clips in the gallbladder fossa postcholecystectomy. No biliary dilatation. Pancreas: No ductal dilatation or inflammation. Spleen: Normal in size without focal abnormality. Adrenals/Urinary Tract: Normal adrenal glands. Heterogeneous right renal enhancement with areas of decreased enhancement, particularly in the upper pole. Mild right perinephric edema. No hydronephrosis. No focal renal fluid collection. Mild urinary bladder wall thickening and perivesicular stranding. Small cysts in the left kidney which is otherwise unremarkable. No left hydronephrosis. Stomach/Bowel: Nondistended stomach. No small bowel inflammation, wall thickening or obstruction. Normal appendix, series 2, image 50. Scattered high-density ingested material throughout the colon. Moderate colonic stool burden. No colonic wall thickening. No pericolonic edema. Previous soft tissue prominence in the region of the posterior rectum is not definitively seen. Vascular/Lymphatic: Moderate to advanced aortic atherosclerosis. No aneurysm. Portal vein is patent. No adenopathy. Reproductive: Retroverted uterus. No adnexal mass. Other: No free air, free fluid, or intra-abdominal fluid collection. Small fat containing  infraumbilical paramedian ventral abdominal wall hernia contains only fat. Musculoskeletal: There are no acute or suspicious osseous abnormalities. Degenerative change in the spine, prominent degenerative disc disease at L3-L4 appears similar. IMPRESSION: 1. Findings consistent with right pyelonephritis.  No renal abscess. 2. No other acute findings.  Normal appendix. Aortic Atherosclerosis (ICD10-I70.0). Electronically Signed   By: Keith Rake M.D.   On: 08/25/2019 23:51   DG Chest Portable 1 View  Result Date: 08/25/2019 CLINICAL DATA:  Fevers EXAM: PORTABLE CHEST 1 VIEW COMPARISON:  07/09/2015 FINDINGS: Cardiac shadow is stable. The lungs are well aerated bilaterally. Postsurgical changes are noted in the right breast. No focal infiltrate or sizable effusion is seen. No bony abnormality is noted. IMPRESSION: No acute abnormality noted. Electronically Signed  By: Inez Catalina M.D.   On: 08/25/2019 22:22        Scheduled Meds: . enoxaparin (LOVENOX) injection  40 mg Subcutaneous Q24H  . insulin aspart  0-5 Units Subcutaneous QHS  . insulin aspart  0-9 Units Subcutaneous TID WC  . [START ON 08/28/2019] insulin aspart  3 Units Subcutaneous TID WC  . insulin glargine  6 Units Subcutaneous QHS  . simvastatin  20 mg Oral QHS  . tamoxifen  20 mg Oral Daily   Continuous Infusions: . sodium chloride 75 mL/hr at 08/26/19 2116  . meropenem (MERREM) IV 1 g (08/27/19 1749)     LOS: 1 day    Time spent: 25 minutes    Blain Pais, MD [facility]   If 7PM-7AM, please contact night-coverage [email or web address] Password [facility] 08/27/2019, 6:51 PM

## 2019-08-28 LAB — CBC
HCT: 33.2 % — ABNORMAL LOW (ref 36.0–46.0)
Hemoglobin: 11.5 g/dL — ABNORMAL LOW (ref 12.0–15.0)
MCH: 31 pg (ref 26.0–34.0)
MCHC: 34.6 g/dL (ref 30.0–36.0)
MCV: 89.5 fL (ref 80.0–100.0)
Platelets: 246 10*3/uL (ref 150–400)
RBC: 3.71 MIL/uL — ABNORMAL LOW (ref 3.87–5.11)
RDW: 12.4 % (ref 11.5–15.5)
WBC: 7.5 10*3/uL (ref 4.0–10.5)
nRBC: 0 % (ref 0.0–0.2)

## 2019-08-28 LAB — GLUCOSE, CAPILLARY
Glucose-Capillary: 280 mg/dL — ABNORMAL HIGH (ref 70–99)
Glucose-Capillary: 322 mg/dL — ABNORMAL HIGH (ref 70–99)
Glucose-Capillary: 270 mg/dL — ABNORMAL HIGH (ref 70–99)
Glucose-Capillary: 268 mg/dL — ABNORMAL HIGH (ref 70–99)

## 2019-08-28 LAB — BASIC METABOLIC PANEL
Anion gap: 9 (ref 5–15)
BUN: 11 mg/dL (ref 8–23)
CO2: 21 mmol/L — ABNORMAL LOW (ref 22–32)
Calcium: 8.4 mg/dL — ABNORMAL LOW (ref 8.9–10.3)
Chloride: 107 mmol/L (ref 98–111)
Creatinine, Ser: 0.59 mg/dL (ref 0.44–1.00)
GFR calc Af Amer: >60 mL/min (ref >60)
GFR calc non Af Amer: >60 mL/min (ref >60)
Glucose, Bld: 267 mg/dL — ABNORMAL HIGH (ref 70–99)
Potassium: 3.8 mmol/L (ref 3.5–5.1)
Sodium: 137 mmol/L (ref 135–145)

## 2019-08-28 LAB — CULTURE, BLOOD (ROUTINE X 2): Special Requests: ADEQUATE

## 2019-08-28 MED ORDER — AMITRIPTYLINE HCL 50 MG PO TABS
50.0000 mg | ORAL_TABLET | Freq: Every day | ORAL | Status: DC
  Administered 2019-08-28 – 2019-08-30 (×3): 50 mg via ORAL
  Filled 2019-08-28 (×4): qty 1

## 2019-08-28 MED ORDER — INSULIN GLARGINE 100 UNIT/ML ~~LOC~~ SOLN
10.0000 [IU] | Freq: Every day | SUBCUTANEOUS | Status: DC
  Administered 2019-08-28 – 2019-08-29 (×2): 10 [IU] via SUBCUTANEOUS
  Filled 2019-08-28 (×3): qty 0.1

## 2019-08-28 NOTE — Telephone Encounter (Signed)
Referral notes faxed to [facility] they will call to schedule, also entered in [facility] as well.

## 2019-08-28 NOTE — Progress Notes (Signed)
PROGRESS NOTE    Destiny Moody  [id number] DOB: 1951-05-11 DOA: 08/25/2019 PCP: Donald Prose, MD  Brief Narrative:  Destiny Moody is a 68 y.o. female with medical history significant for type 2 diabetes, hyperlipidemia, history of breast cancer, and osteoporosis who presents to the ED for evaluation of dysuria with worsening right flank pain.  Patient reports about 4 days of dysuria with increased urinary frequency.  She has had associated right flank and right lower quadrant abdominal pain.  She reports some nausea, vomiting, chills, and diaphoresis but denies subjective fever.  She denies any chest pain, dyspnea, cough.  Patient had prior admission in August 2020 for ESBL E. coli bacteremia secondary to UTI/pyelonephritis.  In the ED she was found to have UA consistent with UTI, CT abd/pelvis showed right pyelonephritis without abscess.    Assessment & Plan:   Principal Problem:   Sepsis due to urinary tract infection ([place]) Active Problems:   Diabetes mellitus type II, non insulin dependent ([place])   Hyperlipidemia associated with type 2 diabetes mellitus ([place])   Acute pyelonephritis  Destiny Moody is a 68 y.o. female with medical history significant for type 2 diabetes, hyperlipidemia, history of breast cancer, and osteoporosis who is admitted with sepsis due to UTI/right-sided pyelonephritis.  Sepsis due to ESBL UTI/right-sided pyelonephritis poa, and E. Coli bacteremia: She presented with persistent tachycardia, leukocytosis with WBC of 15.4K, fever at home and temp of 100.62F here.  Given recent history of ESBL E. coli bacteremia/UTI, she was started on meropenem v Rocephin, empirically.  -Blood cultures from 12/8 showing E. Coli bacteremia. Repeat blood cultures from 12/10 with NGTD.  -Urine culture with ESBL E. Coli. -Continue as needed pain control and antiemetics -Patient will need Urology referral in the outpatient setting--this is already being set up by her GYN, the  patient reports history of urinary urgency.   -Continue meropenem for at least 5 days.   Hyperglycemia in type 2 diabetes: Hyperglycemic in setting of acute infection.  Home regimen includes Metformin and Victoza. -Continue SSI plus HS coverage while in hospital, started low dose Lantus hs, will need to increase it from 6 units to 10 units due to elevated fasting blood sugar.  -Continue Novolog TID for meal coverage, appreciate diabetes coordinator recommendations. Will adjust indulin regimen as needed  Hyperlipidemia: -Continue home simvastatin  History of breast cancer: S/p right lumpectomy.  Follows with oncology, Dr. Jana Hakim, and currently on antihormonal therapy. -Continue tamoxifen  Anxiety and insomnia.  She reports taking Elavil 50mg  po daily at 5pm at home for her anxiety and insomnia. Will start this medication today.   DVT prophylaxis: Lovenox Code Status: Full code Family Communication: Discussed with patient.  Disposition Plan: Pending clinical improvement. Consults called: None Admission status: Continue inpatient care.  Procedures:  None Antimicrobials:  Meropenem 12/09>>  Subjective: Patient reports feeling better but she is tired, could not sleep much last night.   Objective: Vitals:   08/27/19 2208 08/27/19 2304 08/28/19 0549 08/28/19 1342  BP: 121/60  133/62 125/74  Pulse: 97  80 94  Resp: 16  16 16   Temp:  99.4 F (37.4 C) 98.1 F (36.7 C) 98 F (36.7 C)  TempSrc:  Oral Oral Oral  SpO2: 96%  99% 98%  Weight:      Height:        Intake/Output Summary (Last 24 hours) at 08/28/2019 1653 Last data filed at 08/28/2019 1355 Gross per 24 hour  Intake 1221.36 ml  Output 3450 ml  Net -  2228.64 ml   Filed Weights   08/25/19 1429  Weight: 61.2 kg    Examination:  General exam: Appears calm and comfortable  Respiratory system: No respiratory distress, no cough. No wheezing.  Cardiovascular system: RRR.  No pedal edema. Gastrointestinal  system: Abdomen is nondistended, soft and nontender.  Central nervous system: Alert and oriented x3. No focal neurological deficits. Extremities: Symmetric 5 x 5 power. Skin: No rashes, lesions or ulcers Psychiatry:  Mood & affect appropriate.     Data Reviewed: I have personally reviewed following labs and imaging studies  CBC: Recent Labs  Lab 08/25/19 2110 08/26/19 0453 08/26/19 1401 08/28/19 0457  WBC 15.4* 16.1* 14.0* 7.5  HGB 12.9 12.8 10.4* 11.5*  HCT 38.2 37.2 31.2* 33.2*  MCV 94.6 93.0 94.3 89.5  PLT 258 217 201 0000000   Basic Metabolic Panel: Recent Labs  Lab 08/25/19 2110 08/26/19 0453 08/26/19 1401 08/28/19 0457  NA 131* 135 131* 137  K 3.9 3.9 3.6 3.8  CL 97* 104 102 107  CO2 21* 18* 20* 21*  GLUCOSE 251* 196* 317* 267*  BUN 16 12 12 11   CREATININE 0.76 0.64 0.74 0.59  CALCIUM 8.9 8.5* 7.8* 8.4*   GFR: Estimated Creatinine Clearance: 59.5 mL/min (by C-G formula based on SCr of 0.59 mg/dL). Liver Function Tests: Recent Labs  Lab 08/25/19 2110  AST 34  ALT 49*  ALKPHOS 52  BILITOT 0.8  PROT 8.0  ALBUMIN 3.8   Recent Labs  Lab 08/25/19 2110  LIPASE 28   No results for input(s): AMMONIA in the last 168 hours. Coagulation Profile: No results for input(s): INR, PROTIME in the last 168 hours. Cardiac Enzymes: No results for input(s): CKTOTAL, CKMB, CKMBINDEX, TROPONINI in the last 168 hours. BNP (last 3 results) No results for input(s): PROBNP in the last 8760 hours. HbA1C: Recent Labs    08/26/19 0453  HGBA1C 7.0*   CBG: Recent Labs  Lab 08/27/19 1646 08/27/19 2151 08/28/19 0740 08/28/19 1143 08/28/19 1640  GLUCAP 272* 280* 280* 322* 270*   Lipid Profile: No results for input(s): CHOL, HDL, LDLCALC, TRIG, CHOLHDL, LDLDIRECT in the last 72 hours. Thyroid Function Tests: No results for input(s): TSH, T4TOTAL, FREET4, T3FREE, THYROIDAB in the last 72 hours. Anemia Panel: No results for input(s): VITAMINB12, FOLATE, FERRITIN, TIBC,  IRON, RETICCTPCT in the last 72 hours. Sepsis Labs: Recent Labs  Lab 08/25/19 2147  LATICACIDVEN 1.4    Recent Results (from the past 240 hour(s))  Urine C&S     Status: Abnormal   Collection Time: 08/25/19  7:52 PM   Specimen: Urine, Clean Catch  Result Value Ref Range Status   Specimen Description   Final    URINE, CLEAN CATCH Performed at [facility], [place] [address], [place], [place] [address]    Special Requests   Final    NONE Performed at [facility], [place] [address], [place], [place] [address]    Culture (A)  Final    50,000 COLONIES/mL ESCHERICHIA COLI Confirmed Extended Spectrum Beta-Lactamase Producer (ESBL).  In bloodstream infections from ESBL organisms, carbapenems are preferred over piperacillin/tazobactam. They are shown to have a lower risk of mortality.    Report Status 08/27/2019 FINAL  Final   Organism ID, Bacteria ESCHERICHIA COLI (A)  Final      Susceptibility   Escherichia coli - MIC*    AMPICILLIN >=32 RESISTANT Resistant     CEFAZOLIN >=64 RESISTANT Resistant     CEFTRIAXONE >=64 RESISTANT  Resistant     CIPROFLOXACIN <=0.25 SENSITIVE Sensitive     GENTAMICIN <=1 SENSITIVE Sensitive     IMIPENEM <=0.25 SENSITIVE Sensitive     NITROFURANTOIN 64 INTERMEDIATE Intermediate     TRIMETH/SULFA >=320 RESISTANT Resistant     AMPICILLIN/SULBACTAM 8 SENSITIVE Sensitive     PIP/TAZO <=4 SENSITIVE Sensitive     * 50,000 COLONIES/mL ESCHERICHIA COLI  Blood culture (routine x 2)     Status: None (Preliminary result)   Collection Time: 08/25/19 10:44 PM   Specimen: BLOOD  Result Value Ref Range Status   Specimen Description   Final    BLOOD LEFT ANTECUBITAL Performed at [facility] [address], [place], [place] [address]    Special Requests   Final    BOTTLES DRAWN AEROBIC AND ANAEROBIC Blood Culture adequate volume Performed at [facility] [address], [place], [place] [address]     Culture   Final    NO GROWTH 2 DAYS Performed at [facility] [address] [place], [place] [address]    Report Status PENDING  Incomplete  Blood culture (routine x 2)     Status: Abnormal   Collection Time: 08/25/19 10:44 PM   Specimen: BLOOD  Result Value Ref Range Status   Specimen Description   Final    BLOOD BLOOD LEFT WRIST Performed at [facility] [address], [place], [place] [address]    Special Requests   Final    BOTTLES DRAWN AEROBIC AND ANAEROBIC Blood Culture adequate volume Performed at [facility] [address], [place], [place] [address]    Culture  Setup Time   Final    GRAM NEGATIVE RODS AEROBIC BOTTLE ONLY CRITICAL RESULT CALLED TO, READ BACK BY AND VERIFIED WITH: Christean Grief [facility] 08/26/19 1830 JDW Performed at [facility], [place] [address] [place], [place] [address]    Culture (A)  Final    ESCHERICHIA COLI Confirmed Extended Spectrum Beta-Lactamase Producer (ESBL).  In bloodstream infections from ESBL organisms, carbapenems are preferred over piperacillin/tazobactam. They are shown to have a lower risk of mortality.    Report Status 08/28/2019 FINAL  Final   Organism ID, Bacteria ESCHERICHIA COLI  Final      Susceptibility   Escherichia coli - MIC*    AMPICILLIN >=32 RESISTANT Resistant     CEFAZOLIN >=64 RESISTANT Resistant     CEFEPIME RESISTANT Resistant     CEFTAZIDIME RESISTANT Resistant     CEFTRIAXONE RESISTANT Resistant     CIPROFLOXACIN <=0.25 SENSITIVE Sensitive     GENTAMICIN <=1 SENSITIVE Sensitive     IMIPENEM <=0.25 SENSITIVE Sensitive     TRIMETH/SULFA >=320 RESISTANT Resistant     AMPICILLIN/SULBACTAM 4 SENSITIVE Sensitive     PIP/TAZO <=4 SENSITIVE Sensitive     * ESCHERICHIA COLI  Blood Culture ID Panel (Reflexed)     Status: Abnormal   Collection Time: 08/25/19 10:44 PM  Result Value Ref Range Status   Enterococcus species NOT DETECTED NOT DETECTED Final   Listeria  monocytogenes NOT DETECTED NOT DETECTED Final   Staphylococcus species NOT DETECTED NOT DETECTED Final   Staphylococcus aureus (BCID) NOT DETECTED NOT DETECTED Final   Streptococcus species NOT DETECTED NOT DETECTED Final   Streptococcus agalactiae NOT DETECTED NOT DETECTED Final   Streptococcus pneumoniae NOT DETECTED NOT DETECTED Final   Streptococcus pyogenes NOT DETECTED NOT DETECTED Final   Acinetobacter baumannii NOT DETECTED NOT DETECTED Final  Enterobacteriaceae species DETECTED (A) NOT DETECTED Final    Comment: Enterobacteriaceae represent a large family of gram-negative bacteria, not a single organism. CRITICAL RESULT CALLED TO, READ BACK BY AND VERIFIED WITH: J LEGGE PHARMD 08/26/19 1830 JDW    Enterobacter cloacae complex NOT DETECTED NOT DETECTED Final   Escherichia coli DETECTED (A) NOT DETECTED Final    Comment: CRITICAL RESULT CALLED TO, READ BACK BY AND VERIFIED WITH: J LEGGE PHARMD 08/26/19 1830 JDW    Klebsiella oxytoca NOT DETECTED NOT DETECTED Final   Klebsiella pneumoniae NOT DETECTED NOT DETECTED Final   Proteus species NOT DETECTED NOT DETECTED Final   Serratia marcescens NOT DETECTED NOT DETECTED Final   Carbapenem resistance NOT DETECTED NOT DETECTED Final   Haemophilus influenzae NOT DETECTED NOT DETECTED Final   Neisseria meningitidis NOT DETECTED NOT DETECTED Final   Pseudomonas aeruginosa NOT DETECTED NOT DETECTED Final   Candida albicans NOT DETECTED NOT DETECTED Final   Candida glabrata NOT DETECTED NOT DETECTED Final   Candida krusei NOT DETECTED NOT DETECTED Final   Candida parapsilosis NOT DETECTED NOT DETECTED Final   Candida tropicalis NOT DETECTED NOT DETECTED Final    Comment: Performed at [facility], [place] [address], [place], [place] [address]  SARS CORONAVIRUS 2 (TAT 6-24 HRS) Nasopharyngeal Nasopharyngeal Swab     Status: None   Collection Time: 08/26/19 12:57 AM   Specimen: Nasopharyngeal Swab  Result Value Ref Range Status   SARS  Coronavirus 2 NEGATIVE NEGATIVE Final    Comment: (NOTE) SARS-CoV-2 target nucleic acids are NOT DETECTED. The SARS-CoV-2 RNA is generally detectable in upper and lower respiratory specimens during the acute phase of infection. Negative results do not preclude SARS-CoV-2 infection, do not rule out co-infections with other pathogens, and should not be used as the sole basis for treatment or other patient management decisions. Negative results must be combined with clinical observations, patient history, and epidemiological information. The expected result is Negative. Fact Sheet for Patients: SugarRoll.be Fact Sheet for Healthcare Providers: https://www.woods-mathews.com/ This test is not yet approved or cleared by the [place] FDA and  has been authorized for detection and/or diagnosis of SARS-CoV-2 by FDA under an Emergency Use Authorization (EUA). This EUA will remain  in effect (meaning this test can be used) for the duration of the COVID-19 declaration under Section 56 4(b)(1) of the Act, 21 U.S.C. section 360bbb-3(b)(1), unless the authorization is terminated or revoked sooner. Performed at [facility], [place] [address], [place], [place] [address]   Culture, blood (routine x 2)     Status: None (Preliminary result)   Collection Time: 08/27/19  7:26 PM   Specimen: BLOOD  Result Value Ref Range Status   Specimen Description   Final    BLOOD RIGHT ARM Performed at [facility] [address] [place], [place] [address]    Special Requests   Final    BOTTLES DRAWN AEROBIC AND ANAEROBIC Blood Culture adequate volume Performed at [facility] [address], [place], [place] [address]    Culture   Final    NO GROWTH < 24 HOURS Performed at [facility] [address] [place], [place] [address]    Report Status PENDING  Incomplete  Culture, blood (routine x 2)     Status: None  (Preliminary result)   Collection Time: 08/27/19  7:26 PM   Specimen: BLOOD  Result Value Ref Range Status   Specimen Description   Final  BLOOD RIGHT ARM Performed at [facility], [place] [address] [place], [place] [address]    Special Requests   Final    BOTTLES DRAWN AEROBIC AND ANAEROBIC Blood Culture adequate volume Performed at [facility] [address] [place], [place] [address]    Culture   Final    NO GROWTH < 24 HOURS Performed at [facility] [address], [place],  [address]    Report Status PENDING  Incomplete         Radiology Studies: No results found.      Scheduled Meds: . enoxaparin (LOVENOX) injection  40 mg Subcutaneous Q24H  . insulin aspart  0-5 Units Subcutaneous QHS  . insulin aspart  0-9 Units Subcutaneous TID WC  . insulin aspart  3 Units Subcutaneous TID WC  . insulin glargine  6 Units Subcutaneous QHS  . simvastatin  20 mg Oral QHS  . tamoxifen  20 mg Oral Daily   Continuous Infusions: . sodium chloride 75 mL/hr at 08/28/19 0129  . meropenem (MERREM) IV 1 g (08/28/19 0804)     LOS: 2 days    Time spent: 25 minutes    Blain Pais, MD [facility]   If 7PM-7AM, please contact night-coverage [email or web address] Password [email or web address] 08/28/2019, 4:53 PM

## 2019-08-29 LAB — BASIC METABOLIC PANEL
Anion gap: 9 (ref 5–15)
BUN: 10 mg/dL (ref 8–23)
CO2: 23 mmol/L (ref 22–32)
Calcium: 8.4 mg/dL — ABNORMAL LOW (ref 8.9–10.3)
Chloride: 107 mmol/L (ref 98–111)
Creatinine, Ser: 0.56 mg/dL (ref 0.44–1.00)
GFR calc Af Amer: >60 mL/min (ref >60)
GFR calc non Af Amer: >60 mL/min (ref >60)
Glucose, Bld: 191 mg/dL — ABNORMAL HIGH (ref 70–99)
Potassium: 3.9 mmol/L (ref 3.5–5.1)
Sodium: 139 mmol/L (ref 135–145)

## 2019-08-29 LAB — CBC
HCT: 32.3 % — ABNORMAL LOW (ref 36.0–46.0)
Hemoglobin: 11 g/dL — ABNORMAL LOW (ref 12.0–15.0)
MCH: 31.3 pg (ref 26.0–34.0)
MCHC: 34.1 g/dL (ref 30.0–36.0)
MCV: 92 fL (ref 80.0–100.0)
Platelets: 292 10*3/uL (ref 150–400)
RBC: 3.51 MIL/uL — ABNORMAL LOW (ref 3.87–5.11)
RDW: 12.6 % (ref 11.5–15.5)
WBC: 6.9 10*3/uL (ref 4.0–10.5)
nRBC: 0 % (ref 0.0–0.2)

## 2019-08-29 LAB — GLUCOSE, CAPILLARY
Glucose-Capillary: 225 mg/dL — ABNORMAL HIGH (ref 70–99)
Glucose-Capillary: 220 mg/dL — ABNORMAL HIGH (ref 70–99)
Glucose-Capillary: 247 mg/dL — ABNORMAL HIGH (ref 70–99)
Glucose-Capillary: 294 mg/dL — ABNORMAL HIGH (ref 70–99)

## 2019-08-29 NOTE — Progress Notes (Signed)
PROGRESS NOTE    Destiny Moody  [id number] DOB: 05-21-1951 DOA: 08/25/2019 PCP: Donald Prose, MD  Brief Narrative:  Destiny Moody is a 68 y.o. female with medical history significant for type 2 diabetes, hyperlipidemia, history of breast cancer, and osteoporosis who presents to the ED for evaluation of dysuria with worsening right flank pain.  Patient reports about 4 days of dysuria with increased urinary frequency.  She has had associated right flank and right lower quadrant abdominal pain.  She reports some nausea, vomiting, chills, and diaphoresis but denies subjective fever.  She denies any chest pain, dyspnea, cough.  Patient had prior admission in August 2020 for ESBL E. coli bacteremia secondary to UTI/pyelonephritis.  In the ED she was found to have UA consistent with UTI, CT abd/pelvis showed right pyelonephritis without abscess.    Assessment & Plan:   Principal Problem:   Sepsis due to urinary tract infection ([place]) Active Problems:   Diabetes mellitus type II, non insulin dependent ([place])   Hyperlipidemia associated with type 2 diabetes mellitus ([place])   Acute pyelonephritis  Destiny Moody is a 68 y.o. female with medical history significant for type 2 diabetes, hyperlipidemia, history of breast cancer, and osteoporosis who is admitted with sepsis due to UTI/right-sided pyelonephritis.  Sepsis due to ESBL UTI/right-sided pyelonephritis poa, and E. Coli bacteremia: She presented with persistent tachycardia, leukocytosis with WBC of 15.4K, fever at home and temp of 100.81F here.  Given recent history of ESBL E. coli bacteremia/UTI, she was started on meropenem v Rocephin, empirically.  -Blood cultures from 12/8 showing E. Coli bacteremia. Repeat blood cultures from 12/10 with NGTD.  -Urine culture with ESBL E. Coli. -Continue as needed pain control and antiemetics -Patient will need Urology referral in the outpatient setting--this is already being set up by her GYN, the  patient reports history of urinary urgency.   -Continue meropenem for at least 5 days (last dose on 12/13 evening).   Hyperglycemia in type 2 diabetes: Hyperglycemic in setting of acute infection.  Home regimen includes Metformin and Victoza. -Continue SSI plus HS coverage while in hospital, started low dose Lantus hs, will need to increase it from 6 units to 10 units due to elevated fasting blood sugar.  -Continue Novolog TID for meal coverage, appreciate diabetes coordinator recommendations. Will adjust indulin regimen as needed  Hyperlipidemia: -Continue home simvastatin  History of breast cancer: S/p right lumpectomy.  Follows with oncology, Dr. Jana Hakim, and currently on antihormonal therapy. -Continue tamoxifen  Anxiety and insomnia.  She reports taking Elavil 50mg  po daily at 5pm at home for her anxiety and insomnia. Will Continue this medication as it helped her with insomnia and anxiety.   DVT prophylaxis: Lovenox Code Status: Full code Family Communication: Discussed with patient.  Disposition Plan: Pending clinical improvement. Consults called: None Admission status: Continue inpatient care.  Procedures:  None Antimicrobials:  Meropenem 12/09>>  Subjective: Patient seen and examined this morning. Reports feeling better, had a more restful night after taking Elavil.    Objective: Vitals:   08/28/19 1342 08/28/19 2001 08/29/19 0639 08/29/19 1314  BP: 125/74 120/66 96/77 96/86   Pulse: 94 96 83 76  Resp: 16 19 18 18   Temp: 98 F (36.7 C) 98.2 F (36.8 C) 98.3 F (36.8 C) 98.6 F (37 C)  TempSrc: Oral Oral Oral Oral  SpO2: 98% 99% 98% 99%  Weight:      Height:        Intake/Output Summary (Last 24 hours) at 08/29/2019 1757 Last  data filed at 08/29/2019 1750 Gross per 24 hour  Intake 1775.64 ml  Output -  Net 1775.64 ml   Filed Weights   08/25/19 1429  Weight: 61.2 kg    Examination:  General exam: Appears calm and comfortable  Respiratory  system: No respiratory distress, no cough. No wheezing.  Cardiovascular system: RRR.  No pedal edema. Gastrointestinal system: Abdomen is nondistended, soft and nontender.  Central nervous system: Alert and oriented x3. No focal neurological deficits. Extremities: Symmetric 5 x 5 power. Skin: No rashes, lesions or ulcers Psychiatry:  Mood & affect appropriate.     Data Reviewed: I have personally reviewed following labs and imaging studies  CBC: Recent Labs  Lab 08/25/19 2110 08/26/19 0453 08/26/19 1401 08/28/19 0457 08/29/19 0449  WBC 15.4* 16.1* 14.0* 7.5 6.9  HGB 12.9 12.8 10.4* 11.5* 11.0*  HCT 38.2 37.2 31.2* 33.2* 32.3*  MCV 94.6 93.0 94.3 89.5 92.0  PLT 258 217 201 246 [id number]   Basic Metabolic Panel: Recent Labs  Lab 08/25/19 2110 08/26/19 0453 08/26/19 1401 08/28/19 0457 08/29/19 0449  NA 131* 135 131* 137 139  K 3.9 3.9 3.6 3.8 3.9  CL 97* 104 102 107 107  CO2 21* 18* 20* 21* 23  GLUCOSE 251* 196* 317* 267* 191*  BUN 16 12 12 11 10   CREATININE 0.76 0.64 0.74 0.59 0.56  CALCIUM 8.9 8.5* 7.8* 8.4* 8.4*   GFR: Estimated Creatinine Clearance: 59.5 mL/min (by C-G formula based on SCr of 0.56 mg/dL). Liver Function Tests: Recent Labs  Lab 08/25/19 2110  AST 34  ALT 49*  ALKPHOS 52  BILITOT 0.8  PROT 8.0  ALBUMIN 3.8   Recent Labs  Lab 08/25/19 2110  LIPASE 28   No results for input(s): AMMONIA in the last 168 hours. Coagulation Profile: No results for input(s): INR, PROTIME in the last 168 hours. Cardiac Enzymes: No results for input(s): CKTOTAL, CKMB, CKMBINDEX, TROPONINI in the last 168 hours. BNP (last 3 results) No results for input(s): PROBNP in the last 8760 hours. HbA1C: No results for input(s): HGBA1C in the last 72 hours. CBG: Recent Labs  Lab 08/28/19 1640 08/28/19 2109 08/29/19 0733 08/29/19 1126 08/29/19 1620  GLUCAP 270* 268* 225* 220* 247*   Lipid Profile: No results for input(s): CHOL, HDL, LDLCALC, TRIG, CHOLHDL,  LDLDIRECT in the last 72 hours. Thyroid Function Tests: No results for input(s): TSH, T4TOTAL, FREET4, T3FREE, THYROIDAB in the last 72 hours. Anemia Panel: No results for input(s): VITAMINB12, FOLATE, FERRITIN, TIBC, IRON, RETICCTPCT in the last 72 hours. Sepsis Labs: Recent Labs  Lab 08/25/19 2147  LATICACIDVEN 1.4    Recent Results (from the past 240 hour(s))  Urine C&S     Status: Abnormal   Collection Time: 08/25/19  7:52 PM   Specimen: Urine, Clean Catch  Result Value Ref Range Status   Specimen Description   Final    URINE, CLEAN CATCH Performed at [facility], [place] [address], [place], [place] [address]    Special Requests   Final    NONE Performed at [facility], [place] [address], [place], [place] [address]    Culture (A)  Final    50,000 COLONIES/mL ESCHERICHIA COLI Confirmed Extended Spectrum Beta-Lactamase Producer (ESBL).  In bloodstream infections from ESBL organisms, carbapenems are preferred over piperacillin/tazobactam. They are shown to have a lower risk of mortality.    Report Status 08/27/2019 FINAL  Final   Organism ID, Bacteria ESCHERICHIA COLI (A)  Final  Susceptibility   Escherichia coli - MIC*    AMPICILLIN >=32 RESISTANT Resistant     CEFAZOLIN >=64 RESISTANT Resistant     CEFTRIAXONE >=64 RESISTANT Resistant     CIPROFLOXACIN <=0.25 SENSITIVE Sensitive     GENTAMICIN <=1 SENSITIVE Sensitive     IMIPENEM <=0.25 SENSITIVE Sensitive     NITROFURANTOIN 64 INTERMEDIATE Intermediate     TRIMETH/SULFA >=320 RESISTANT Resistant     AMPICILLIN/SULBACTAM 8 SENSITIVE Sensitive     PIP/TAZO <=4 SENSITIVE Sensitive     * 50,000 COLONIES/mL ESCHERICHIA COLI  Blood culture (routine x 2)     Status: None (Preliminary result)   Collection Time: 08/25/19 10:44 PM   Specimen: BLOOD  Result Value Ref Range Status   Specimen Description   Final    BLOOD LEFT ANTECUBITAL Performed at [facility] [address], [place], [place] [address]    Special Requests   Final    BOTTLES DRAWN AEROBIC AND ANAEROBIC Blood Culture adequate volume Performed at [facility] [address], [place], [place] [address]    Culture   Final    NO GROWTH 3 DAYS Performed at [facility], [place] [address] [place], [place] [address]    Report Status PENDING  Incomplete  Blood culture (routine x 2)     Status: Abnormal   Collection Time: 08/25/19 10:44 PM   Specimen: BLOOD  Result Value Ref Range Status   Specimen Description   Final    BLOOD BLOOD LEFT WRIST Performed at [facility] [address], [place], [place] [address]    Special Requests   Final    BOTTLES DRAWN AEROBIC AND ANAEROBIC Blood Culture adequate volume Performed at [facility] [address], [place], [place] [address]    Culture  Setup Time   Final    GRAM NEGATIVE RODS AEROBIC BOTTLE ONLY CRITICAL RESULT CALLED TO, READ BACK BY AND VERIFIED WITH: Christean Grief [facility] 08/26/19 1830 JDW Performed at [facility], [place] [address] [place],  [address]    Culture (A)  Final    ESCHERICHIA COLI Confirmed Extended Spectrum Beta-Lactamase Producer (ESBL).  In bloodstream infections from ESBL organisms, carbapenems are preferred over piperacillin/tazobactam. They are shown to have a lower risk of mortality.    Report Status 08/28/2019 FINAL  Final   Organism ID, Bacteria ESCHERICHIA COLI  Final      Susceptibility   Escherichia coli - MIC*    AMPICILLIN >=32 RESISTANT Resistant     CEFAZOLIN >=64 RESISTANT Resistant     CEFEPIME RESISTANT Resistant     CEFTAZIDIME RESISTANT Resistant     CEFTRIAXONE RESISTANT Resistant     CIPROFLOXACIN <=0.25 SENSITIVE Sensitive     GENTAMICIN <=1 SENSITIVE Sensitive     IMIPENEM <=0.25 SENSITIVE Sensitive     TRIMETH/SULFA >=320 RESISTANT Resistant     AMPICILLIN/SULBACTAM 4 SENSITIVE Sensitive     PIP/TAZO <=4 SENSITIVE Sensitive      * ESCHERICHIA COLI  Blood Culture ID Panel (Reflexed)     Status: Abnormal   Collection Time: 08/25/19 10:44 PM  Result Value Ref Range Status   Enterococcus species NOT DETECTED NOT DETECTED Final   Listeria monocytogenes NOT DETECTED NOT DETECTED Final   Staphylococcus species NOT DETECTED NOT DETECTED Final   Staphylococcus aureus (BCID) NOT DETECTED NOT DETECTED Final   Streptococcus species NOT DETECTED NOT DETECTED Final   Streptococcus agalactiae NOT DETECTED NOT DETECTED Final  Streptococcus pneumoniae NOT DETECTED NOT DETECTED Final   Streptococcus pyogenes NOT DETECTED NOT DETECTED Final   Acinetobacter baumannii NOT DETECTED NOT DETECTED Final   Enterobacteriaceae species DETECTED (A) NOT DETECTED Final    Comment: Enterobacteriaceae represent a large family of gram-negative bacteria, not a single organism. CRITICAL RESULT CALLED TO, READ BACK BY AND VERIFIED WITH: J LEGGE PHARMD 08/26/19 1830 JDW    Enterobacter cloacae complex NOT DETECTED NOT DETECTED Final   Escherichia coli DETECTED (A) NOT DETECTED Final    Comment: CRITICAL RESULT CALLED TO, READ BACK BY AND VERIFIED WITH: J LEGGE PHARMD 08/26/19 1830 JDW    Klebsiella oxytoca NOT DETECTED NOT DETECTED Final   Klebsiella pneumoniae NOT DETECTED NOT DETECTED Final   Proteus species NOT DETECTED NOT DETECTED Final   Serratia marcescens NOT DETECTED NOT DETECTED Final   Carbapenem resistance NOT DETECTED NOT DETECTED Final   Haemophilus influenzae NOT DETECTED NOT DETECTED Final   Neisseria meningitidis NOT DETECTED NOT DETECTED Final   Pseudomonas aeruginosa NOT DETECTED NOT DETECTED Final   Candida albicans NOT DETECTED NOT DETECTED Final   Candida glabrata NOT DETECTED NOT DETECTED Final   Candida krusei NOT DETECTED NOT DETECTED Final   Candida parapsilosis NOT DETECTED NOT DETECTED Final   Candida tropicalis NOT DETECTED NOT DETECTED Final    Comment: Performed at [facility], [place] [address],  [place], [place] [address]  SARS CORONAVIRUS 2 (TAT 6-24 HRS) Nasopharyngeal Nasopharyngeal Swab     Status: None   Collection Time: 08/26/19 12:57 AM   Specimen: Nasopharyngeal Swab  Result Value Ref Range Status   SARS Coronavirus 2 NEGATIVE NEGATIVE Final    Comment: (NOTE) SARS-CoV-2 target nucleic acids are NOT DETECTED. The SARS-CoV-2 RNA is generally detectable in upper and lower respiratory specimens during the acute phase of infection. Negative results do not preclude SARS-CoV-2 infection, do not rule out co-infections with other pathogens, and should not be used as the sole basis for treatment or other patient management decisions. Negative results must be combined with clinical observations, patient history, and epidemiological information. The expected result is Negative. Fact Sheet for Patients: SugarRoll.be Fact Sheet for Healthcare Providers: https://www.woods-mathews.com/ This test is not yet approved or cleared by the [place] FDA and  has been authorized for detection and/or diagnosis of SARS-CoV-2 by FDA under an Emergency Use Authorization (EUA). This EUA will remain  in effect (meaning this test can be used) for the duration of the COVID-19 declaration under Section 56 4(b)(1) of the Act, 21 U.S.C. section 360bbb-3(b)(1), unless the authorization is terminated or revoked sooner. Performed at [facility], [place] [address], [place], [place] [address]   Culture, blood (routine x 2)     Status: None (Preliminary result)   Collection Time: 08/27/19  7:26 PM   Specimen: BLOOD  Result Value Ref Range Status   Specimen Description   Final    BLOOD RIGHT ARM Performed at [facility] [address] [place], [place] [address]    Special Requests   Final    BOTTLES DRAWN AEROBIC AND ANAEROBIC Blood Culture adequate volume Performed at [facility] [address], [place], [place]  [address]    Culture   Final    NO GROWTH 2 DAYS Performed at [facility] [address], [place], [place] [address]    Report Status PENDING  Incomplete  Culture, blood (routine x 2)     Status: None (Preliminary result)  Collection Time: 08/27/19  7:26 PM   Specimen: BLOOD  Result Value Ref Range Status   Specimen Description   Final    BLOOD RIGHT ARM Performed at [facility] [place] [address] [place], [place] [address]    Special Requests   Final    BOTTLES DRAWN AEROBIC AND ANAEROBIC Blood Culture adequate volume Performed at [facility] [address] [place], [place] [address]    Culture   Final    NO GROWTH 2 DAYS Performed at [facility] [address], [place],  [address]    Report Status PENDING  Incomplete         Radiology Studies: No results found.      Scheduled Meds: . amitriptyline  50 mg Oral Daily  . enoxaparin (LOVENOX) injection  40 mg Subcutaneous Q24H  . insulin aspart  0-5 Units Subcutaneous QHS  . insulin aspart  0-9 Units Subcutaneous TID WC  . insulin aspart  3 Units Subcutaneous TID WC  . insulin glargine  10 Units Subcutaneous QHS  . simvastatin  20 mg Oral QHS  . tamoxifen  20 mg Oral Daily   Continuous Infusions: . sodium chloride 75 mL/hr at 08/29/19 0906  . meropenem (MERREM) IV 1 g (08/29/19 1747)     LOS: 3 days    Time spent: 25 minutes    Blain Pais, MD [facility]   If 7PM-7AM, please contact night-coverage [email or web address] Password [facility] 08/29/2019, 5:57 PM

## 2019-08-30 LAB — GLUCOSE, CAPILLARY
Glucose-Capillary: 218 mg/dL — ABNORMAL HIGH (ref 70–99)
Glucose-Capillary: 363 mg/dL — ABNORMAL HIGH (ref 70–99)
Glucose-Capillary: 269 mg/dL — ABNORMAL HIGH (ref 70–99)
Glucose-Capillary: 313 mg/dL — ABNORMAL HIGH (ref 70–99)

## 2019-08-30 MED ORDER — INSULIN GLARGINE 100 UNIT/ML ~~LOC~~ SOLN
12.0000 [IU] | Freq: Every day | SUBCUTANEOUS | Status: DC
  Administered 2019-08-30: 12 [IU] via SUBCUTANEOUS
  Filled 2019-08-30 (×2): qty 0.12

## 2019-08-30 MED ORDER — INSULIN ASPART 100 UNIT/ML ~~LOC~~ SOLN
5.0000 [IU] | Freq: Three times a day (TID) | SUBCUTANEOUS | Status: DC
  Administered 2019-08-31 (×2): 5 [IU] via SUBCUTANEOUS

## 2019-08-30 NOTE — Progress Notes (Signed)
No c/o pain or discomfort noted upon urination. Urine is clear and straw colored. fuilds left at [facility] to keep line open for antibiotics. Pt has been ambulating to restroom with minimal assistance. Eating meal now in recliner with feet elevated. Call bell within reach.

## 2019-08-30 NOTE — Plan of Care (Signed)

## 2019-08-30 NOTE — Progress Notes (Signed)
PROGRESS NOTE    Destiny Moody  [id number] DOB: March 06, 1951 DOA: 08/25/2019 PCP: Donald Prose, MD  Brief Narrative:  Destiny Moody is a 68 y.o. female with medical history significant for type 2 diabetes, hyperlipidemia, history of breast cancer, and osteoporosis who presents to the ED for evaluation of dysuria with worsening right flank pain.  Patient reports about 4 days of dysuria with increased urinary frequency.  She has had associated right flank and right lower quadrant abdominal pain.  She reports some nausea, vomiting, chills, and diaphoresis but denies subjective fever.  She denies any chest pain, dyspnea, cough.  Patient had prior admission in August 2020 for ESBL E. coli bacteremia secondary to UTI/pyelonephritis.  In the ED she was found to have UA consistent with UTI, CT abd/pelvis showed right pyelonephritis without abscess.    Assessment & Plan:   Principal Problem:   Sepsis due to urinary tract infection ([facility]) Active Problems:   Diabetes mellitus type II, non insulin dependent ([place])   Hyperlipidemia associated with type 2 diabetes mellitus ([place])   Acute pyelonephritis  Destiny Moody is a 68 y.o. female with medical history significant for type 2 diabetes, hyperlipidemia, history of breast cancer, and osteoporosis who is admitted with sepsis due to UTI/right-sided pyelonephritis.  Sepsis due to ESBL UTI/right-sided pyelonephritis poa, and E. Coli bacteremia: She presented with persistent tachycardia, leukocytosis with WBC of 15.4K, fever at home and temp of 100.40F here.  Given recent history of ESBL E. coli bacteremia/UTI, she was started on meropenem v Rocephin, empirically.  -Blood cultures from 12/8 showing E. Coli bacteremia. Repeat blood cultures from 12/10 with NGTD.  -Urine culture with ESBL E. Coli. -Continue as needed pain control and antiemetics -Patient will need Urology referral in the outpatient setting--this is already being set up by her GYN, the  patient reports history of urinary urgency.   -Continue meropenem for at least 5 days (last dose on 12/13 evening).  -Discontinued IV fluids today.   Hyperglycemia in type 2 diabetes: Hyperglycemic in setting of acute infection.  Home regimen includes Metformin and Victoza. -Continue SSI plus HS coverage while in hospital, started low dose Lantus hs, will need to increase it from 6 units to 10 units due to elevated fasting blood sugar.  -Continue Novolog TID for meal coverage, appreciate diabetes coordinator recommendations. Will adjust indulin regimen as needed  Hyperlipidemia: -Continue home simvastatin  History of breast cancer: S/p right lumpectomy.  Follows with oncology, Dr. Jana Hakim, and currently on antihormonal therapy. -Continue tamoxifen  Anxiety and insomnia.  She reports taking Elavil 50mg  po daily at 5pm at home for her anxiety and insomnia. Will Continue this medication as it helped her with insomnia and anxiety.   DVT prophylaxis: Lovenox Code Status: Full code Family Communication: Discussed with patient.  Disposition Plan: Pending clinical improvement. Consults called: None Admission status: Continue inpatient care.  Procedures:  None Antimicrobials:  Meropenem 12/09>>  Subjective: Patient reports having a good night of sleep last night. Denies nausea, diarrhea. Able to tolerated po intake well.   Objective: Vitals:   08/29/19 1314 08/29/19 2042 08/30/19 0544 08/30/19 1349  BP: 96/86 (!) 98/52 113/60 113/60  Pulse: 76 88 78 84  Resp: 18 18 16 17   Temp: 98.6 F (37 C) 97.6 F (36.4 C) 98.1 F (36.7 C) 98.1 F (36.7 C)  TempSrc: Oral Oral Oral Oral  SpO2: 99% 98% 98% 96%  Weight:      Height:        Intake/Output Summary (  Last 24 hours) at 08/30/2019 1538 Last data filed at 08/30/2019 1359 Gross per 24 hour  Intake 3158.99 ml  Output --  Net 3158.99 ml   Filed Weights   08/25/19 1429  Weight: 61.2 kg    Examination:  General exam:  Appears calm and comfortable  Respiratory system: No respiratory distress, no cough. No wheezing.  Cardiovascular system: RRR.  No pedal edema. Gastrointestinal system: Abdomen is nondistended, soft and nontender.  Central nervous system: Alert and oriented x3. No focal neurological deficits. Extremities: Symmetric 5 x 5 power. Skin: No rashes, lesions or ulcers Psychiatry:  Mood & affect appropriate.     Data Reviewed: I have personally reviewed following labs and imaging studies  CBC: Recent Labs  Lab 08/25/19 2110 08/26/19 0453 08/26/19 1401 08/28/19 0457 08/29/19 0449  WBC 15.4* 16.1* 14.0* 7.5 6.9  HGB 12.9 12.8 10.4* 11.5* 11.0*  HCT 38.2 37.2 31.2* 33.2* 32.3*  MCV 94.6 93.0 94.3 89.5 92.0  PLT 258 217 201 246 [id number]   Basic Metabolic Panel: Recent Labs  Lab 08/25/19 2110 08/26/19 0453 08/26/19 1401 08/28/19 0457 08/29/19 0449  NA 131* 135 131* 137 139  K 3.9 3.9 3.6 3.8 3.9  CL 97* 104 102 107 107  CO2 21* 18* 20* 21* 23  GLUCOSE 251* 196* 317* 267* 191*  BUN 16 12 12 11 10   CREATININE 0.76 0.64 0.74 0.59 0.56  CALCIUM 8.9 8.5* 7.8* 8.4* 8.4*   GFR: Estimated Creatinine Clearance: 59.5 mL/min (by C-G formula based on SCr of 0.56 mg/dL). Liver Function Tests: Recent Labs  Lab 08/25/19 2110  AST 34  ALT 49*  ALKPHOS 52  BILITOT 0.8  PROT 8.0  ALBUMIN 3.8   Recent Labs  Lab 08/25/19 2110  LIPASE 28   No results for input(s): AMMONIA in the last 168 hours. Coagulation Profile: No results for input(s): INR, PROTIME in the last 168 hours. Cardiac Enzymes: No results for input(s): CKTOTAL, CKMB, CKMBINDEX, TROPONINI in the last 168 hours. BNP (last 3 results) No results for input(s): PROBNP in the last 8760 hours. HbA1C: No results for input(s): HGBA1C in the last 72 hours. CBG: Recent Labs  Lab 08/29/19 1126 08/29/19 1620 08/29/19 2035 08/30/19 0752 08/30/19 1225  GLUCAP 220* 247* 294* 218* 363*   Lipid Profile: No results for input(s):  CHOL, HDL, LDLCALC, TRIG, CHOLHDL, LDLDIRECT in the last 72 hours. Thyroid Function Tests: No results for input(s): TSH, T4TOTAL, FREET4, T3FREE, THYROIDAB in the last 72 hours. Anemia Panel: No results for input(s): VITAMINB12, FOLATE, FERRITIN, TIBC, IRON, RETICCTPCT in the last 72 hours. Sepsis Labs: Recent Labs  Lab 08/25/19 2147  LATICACIDVEN 1.4    Recent Results (from the past 240 hour(s))  Urine C&S     Status: Abnormal   Collection Time: 08/25/19  7:52 PM   Specimen: Urine, Clean Catch  Result Value Ref Range Status   Specimen Description   Final    URINE, CLEAN CATCH Performed at [facility], [place] [address], [place], [place] [address]    Special Requests   Final    NONE Performed at [facility], [place] [address], [place], [place] [address]    Culture (A)  Final    50,000 COLONIES/mL ESCHERICHIA COLI Confirmed Extended Spectrum Beta-Lactamase Producer (ESBL).  In bloodstream infections from ESBL organisms, carbapenems are preferred over piperacillin/tazobactam. They are shown to have a lower risk of mortality.    Report Status 08/27/2019 FINAL  Final   Organism ID,  Bacteria ESCHERICHIA COLI (A)  Final      Susceptibility   Escherichia coli - MIC*    AMPICILLIN >=32 RESISTANT Resistant     CEFAZOLIN >=64 RESISTANT Resistant     CEFTRIAXONE >=64 RESISTANT Resistant     CIPROFLOXACIN <=0.25 SENSITIVE Sensitive     GENTAMICIN <=1 SENSITIVE Sensitive     IMIPENEM <=0.25 SENSITIVE Sensitive     NITROFURANTOIN 64 INTERMEDIATE Intermediate     TRIMETH/SULFA >=320 RESISTANT Resistant     AMPICILLIN/SULBACTAM 8 SENSITIVE Sensitive     PIP/TAZO <=4 SENSITIVE Sensitive     * 50,000 COLONIES/mL ESCHERICHIA COLI  Blood culture (routine x 2)     Status: None (Preliminary result)   Collection Time: 08/25/19 10:44 PM   Specimen: BLOOD  Result Value Ref Range Status   Specimen Description   Final    BLOOD LEFT ANTECUBITAL Performed at [facility] [address], [place], [place] [address]    Special Requests   Final    BOTTLES DRAWN AEROBIC AND ANAEROBIC Blood Culture adequate volume Performed at [facility] [address] [place], [place] [address]    Culture   Final    NO GROWTH 4 DAYS Performed at [facility], [place] [address] [place], [place] [address]    Report Status PENDING  Incomplete  Blood culture (routine x 2)     Status: Abnormal   Collection Time: 08/25/19 10:44 PM   Specimen: BLOOD  Result Value Ref Range Status   Specimen Description   Final    BLOOD BLOOD LEFT WRIST Performed at [facility] [address], [place], [place] [address]    Special Requests   Final    BOTTLES DRAWN AEROBIC AND ANAEROBIC Blood Culture adequate volume Performed at [facility] [address], [place], [place] [address]    Culture  Setup Time   Final    GRAM NEGATIVE RODS AEROBIC BOTTLE ONLY CRITICAL RESULT CALLED TO, READ BACK BY AND VERIFIED WITH: Christean Grief [facility] 08/26/19 1830 JDW Performed at [facility], [place] [address], [place],  [address]    Culture (A)  Final    ESCHERICHIA COLI Confirmed Extended Spectrum Beta-Lactamase Producer (ESBL).  In bloodstream infections from ESBL organisms, carbapenems are preferred over piperacillin/tazobactam. They are shown to have a lower risk of mortality.    Report Status 08/28/2019 FINAL  Final   Organism ID, Bacteria ESCHERICHIA COLI  Final      Susceptibility   Escherichia coli - MIC*    AMPICILLIN >=32 RESISTANT Resistant     CEFAZOLIN >=64 RESISTANT Resistant     CEFEPIME RESISTANT Resistant     CEFTAZIDIME RESISTANT Resistant     CEFTRIAXONE RESISTANT Resistant     CIPROFLOXACIN <=0.25 SENSITIVE Sensitive     GENTAMICIN <=1 SENSITIVE Sensitive     IMIPENEM <=0.25 SENSITIVE Sensitive     TRIMETH/SULFA >=320 RESISTANT Resistant     AMPICILLIN/SULBACTAM 4 SENSITIVE Sensitive      PIP/TAZO <=4 SENSITIVE Sensitive     * ESCHERICHIA COLI  Blood Culture ID Panel (Reflexed)     Status: Abnormal   Collection Time: 08/25/19 10:44 PM  Result Value Ref Range Status   Enterococcus species NOT DETECTED NOT DETECTED Final   Listeria monocytogenes NOT DETECTED NOT DETECTED Final   Staphylococcus species NOT DETECTED NOT DETECTED Final   Staphylococcus aureus (BCID) NOT DETECTED NOT DETECTED Final   Streptococcus species NOT DETECTED NOT  DETECTED Final   Streptococcus agalactiae NOT DETECTED NOT DETECTED Final   Streptococcus pneumoniae NOT DETECTED NOT DETECTED Final   Streptococcus pyogenes NOT DETECTED NOT DETECTED Final   Acinetobacter baumannii NOT DETECTED NOT DETECTED Final   Enterobacteriaceae species DETECTED (A) NOT DETECTED Final    Comment: Enterobacteriaceae represent a large family of gram-negative bacteria, not a single organism. CRITICAL RESULT CALLED TO, READ BACK BY AND VERIFIED WITH: J LEGGE PHARMD 08/26/19 1830 JDW    Enterobacter cloacae complex NOT DETECTED NOT DETECTED Final   Escherichia coli DETECTED (A) NOT DETECTED Final    Comment: CRITICAL RESULT CALLED TO, READ BACK BY AND VERIFIED WITH: J LEGGE PHARMD 08/26/19 1830 JDW    Klebsiella oxytoca NOT DETECTED NOT DETECTED Final   Klebsiella pneumoniae NOT DETECTED NOT DETECTED Final   Proteus species NOT DETECTED NOT DETECTED Final   Serratia marcescens NOT DETECTED NOT DETECTED Final   Carbapenem resistance NOT DETECTED NOT DETECTED Final   Haemophilus influenzae NOT DETECTED NOT DETECTED Final   Neisseria meningitidis NOT DETECTED NOT DETECTED Final   Pseudomonas aeruginosa NOT DETECTED NOT DETECTED Final   Candida albicans NOT DETECTED NOT DETECTED Final   Candida glabrata NOT DETECTED NOT DETECTED Final   Candida krusei NOT DETECTED NOT DETECTED Final   Candida parapsilosis NOT DETECTED NOT DETECTED Final   Candida tropicalis NOT DETECTED NOT DETECTED Final    Comment: Performed at [facility], [place] [address] [place], [place] [address]  SARS CORONAVIRUS 2 (TAT 6-24 HRS) Nasopharyngeal Nasopharyngeal Swab     Status: None   Collection Time: 08/26/19 12:57 AM   Specimen: Nasopharyngeal Swab  Result Value Ref Range Status   SARS Coronavirus 2 NEGATIVE NEGATIVE Final    Comment: (NOTE) SARS-CoV-2 target nucleic acids are NOT DETECTED. The SARS-CoV-2 RNA is generally detectable in upper and lower respiratory specimens during the acute phase of infection. Negative results do not preclude SARS-CoV-2 infection, do not rule out co-infections with other pathogens, and should not be used as the sole basis for treatment or other patient management decisions. Negative results must be combined with clinical observations, patient history, and epidemiological information. The expected result is Negative. Fact Sheet for Patients: SugarRoll.be Fact Sheet for Healthcare Providers: https://www.woods-mathews.com/ This test is not yet approved or cleared by the [place] FDA and  has been authorized for detection and/or diagnosis of SARS-CoV-2 by FDA under an Emergency Use Authorization (EUA). This EUA will remain  in effect (meaning this test can be used) for the duration of the COVID-19 declaration under Section 56 4(b)(1) of the Act, 21 U.S.C. section 360bbb-3(b)(1), unless the authorization is terminated or revoked sooner. Performed at [facility], [place] [address] [place], [place] [address]   Culture, blood (routine x 2)     Status: None (Preliminary result)   Collection Time: 08/27/19  7:26 PM   Specimen: BLOOD  Result Value Ref Range Status   Specimen Description   Final    BLOOD RIGHT ARM Performed at [facility] [address], [place], [place] [address]    Special Requests   Final    BOTTLES DRAWN AEROBIC AND ANAEROBIC Blood Culture adequate volume Performed at [facility] [address] [place], [place] [address]    Culture   Final    NO GROWTH 3 DAYS Performed at [facility], [place] [address], [place], [place] [address]    Report Status PENDING  Incomplete  Culture, blood (routine x 2)     Status: None (Preliminary result)   Collection Time: 08/27/19  7:26 PM   Specimen: BLOOD  Result Value Ref Range Status   Specimen Description   Final    BLOOD RIGHT ARM Performed at [facility] [address], [place], [place] [address]    Special Requests   Final    BOTTLES DRAWN AEROBIC AND ANAEROBIC Blood Culture adequate volume Performed at [facility] [address], [place], [place] [address]    Culture   Final    NO GROWTH 3 DAYS Performed at [facility], [place] [address] [place], [place] [address]    Report Status PENDING  Incomplete         Radiology Studies: No results found.      Scheduled Meds: . amitriptyline  50 mg Oral Daily  . enoxaparin (LOVENOX) injection  40 mg Subcutaneous Q24H  . insulin aspart  0-5 Units Subcutaneous QHS  . insulin aspart  0-9 Units Subcutaneous TID WC  . insulin aspart  3 Units Subcutaneous TID WC  . insulin glargine  10 Units Subcutaneous QHS  . simvastatin  20 mg Oral QHS  . tamoxifen  20 mg Oral Daily   Continuous Infusions: . meropenem (MERREM) IV 1 g (08/30/19 1045)     LOS: 4 days    Time spent: 25 minutes    Blain Pais, MD [facility]   If 7PM-7AM, please contact night-coverage [email or web address] Password [facility] 08/30/2019, 3:38 PM

## 2019-08-31 LAB — CULTURE, BLOOD (ROUTINE X 2)
Culture: NO GROWTH
Special Requests: ADEQUATE

## 2019-08-31 LAB — GLUCOSE, CAPILLARY
Glucose-Capillary: 201 mg/dL — ABNORMAL HIGH (ref 70–99)
Glucose-Capillary: 272 mg/dL — ABNORMAL HIGH (ref 70–99)

## 2019-08-31 MED ORDER — OXYBUTYNIN CHLORIDE 5 MG PO TABS: 5.0000 mg | ORAL_TABLET | Freq: Two times a day (BID) | ORAL | 0 refills | Status: DC

## 2019-08-31 MED ORDER — ACETAMINOPHEN 325 MG PO TABS: 650.0000 mg | ORAL_TABLET | Freq: Four times a day (QID) | ORAL | Status: DC | PRN

## 2019-08-31 NOTE — Progress Notes (Signed)
Inpatient Diabetes Program Recommendations  AACE/ADA: New Consensus Statement on Inpatient Glycemic Control (2015)  Target Ranges:  Prepandial:   less than 140 mg/dL      Peak postprandial:   less than 180 mg/dL (1-2 hours)      Critically ill patients:  140 - 180 mg/dL   Lab Results  Component Value Date   GLUCAP 201 (H) 08/31/2019   HGBA1C 7.0 (H) 08/26/2019    Review of Glycemic Control  Results for KARLITA, MCMURDO (MRN [id number]) as of 08/31/2019 09:23  Ref. Range 08/30/2019 07:52 08/30/2019 12:25 08/30/2019 16:41 08/30/2019 21:42 08/31/2019 07:54  Glucose-Capillary Latest Ref Range: 70 - 99 mg/dL 218 (H) 363 (H) 269 (H) 313 (H) 201 (H)   Diabetes history: DM2 Outpatient Diabetes medications: Victoza 1.2 mg QD; Amaryl 2 mg QD; Metformin 2000mg  QD Current orders for Inpatient glycemic control:  Lantus 12 units Novolog 0-9 TID with meals & 0-5 HS Novolog 5 units tid meal coverage  Inpatient Diabetes Program Recommendations:  Fasting glucose > 200 still.   -  Consider increasing Lantus to 15 units.  -Please consider increasing Novolog meal coverage 8 units TID with meals if eats at least 50%  Thank you, Tama Headings RN, MSN, BC-ADM Inpatient Diabetes Coordinator Team Pager [phone] (8a-5p)

## 2019-08-31 NOTE — Discharge Summary (Signed)
Physician Discharge Summary  Destiny Moody W[facility]:[id number] DOB: 12/08/1950 DOA: 08/25/2019  PCP: Donald Prose, MD  Admit date: 08/25/2019 Discharge date: 08/31/2019  Admitted From: Home  Disposition:  Home   Recommendations for Outpatient Follow-up:  1. Follow up with PCP in 1-2 weeks 2. Please follow up in Urology   Home Health: No Equipment/Devices:None   Discharge Condition:Stable CODE STATUS:Full  Diet recommendation: Carb Mod  Brief/Interim Summary: Destiny Bronsonis a 68 y.o.femalewith medical history significant fortype 2 diabetes, hyperlipidemia, history of breast cancer, and osteoporosis who presents to the ED for evaluation of dysuria with worsening right flank pain.  Patient reports about 4 days of dysuria with increased urinary frequency. She has had associated right flank and right lower quadrant abdominal pain. She reports some nausea, vomiting, chills, and diaphoresis but denies subjective fever. She denies any chest pain, dyspnea, cough.  Patient had prior admission in August 2020 for ESBL E. coli bacteremia secondary to UTI/pyelonephritis.  In the ED she was found to have UA consistent with UTI, CT abd/pelvis showed right pyelonephritis without abscess.   Hospital Course:  Sepsis due to ESBL UTI/right-sided pyelonephritis poa, and E. Coli bacteremia: She presented with persistent tachycardia, leukocytosis with WBC of 15.4K, fever at home and temp of 100.80F here.  Given recent history of ESBL E. coli bacteremia/UTI, she was started on meropenem v Rocephin, empirically.  -Blood cultures from 12/8 showing E. Coli bacteremia. Repeat blood cultures from 12/10 with NGTD.  -Urine culture with ESBL E. Coli. -Continue as needed pain control and antiemetics -Patient will need Urology referral in the outpatient setting--this is already being set up by her GYN, the patient reports history of urinary urgency.   -Patient was started on oxybutynin 54m po BID for overactive  bladder but may have this medication adjusted after her discharge.  -Completed meropenem for 5 days.    Hyperglycemia in type 2 diabetes: Hyperglycemic in setting of acute infection. Home regimen includes Metformin and Victoza which will be resumed.   Hyperlipidemia: -Continue home simvastatin  History of breast cancer: S/p right lumpectomy. Follows with oncology, Dr. MJana Hakim and currently on antihormonal therapy. -Continue tamoxifen  Anxiety and insomnia.  Resume her home Elavil.   Discharge Diagnoses:  Principal Problem:   Sepsis due to urinary tract infection ([place] Active Problems:   Diabetes mellitus type II, non insulin dependent ([place]   Hyperlipidemia associated with type 2 diabetes mellitus ([place]   Acute pyelonephritis    Discharge Instructions  Discharge Instructions    Diet Carb Modified   Complete by: As directed    Increase activity slowly   Complete by: As directed      Allergies as of 08/31/2019      Reactions   Shellfish Allergy Hives   unsure      Medication List    STOP taking these medications   clindamycin 2 % vaginal cream Commonly known as: CLEOCIN   ibuprofen 200 MG tablet Commonly known as: ADVIL     TAKE these medications   acetaminophen 325 MG tablet Commonly known as: TYLENOL Take 2 tablets (650 mg total) by mouth every 6 (six) hours as needed for mild pain (or Fever >/= 101).   amitriptyline 25 MG tablet Commonly known as: ELAVIL TAKE ONE TABLET BY MOUTH AT BEDTIME  "OFFICE VISIT NEEDED" What changed:   how much to take  how to take this  when to take this  additional instructions   aspirin 81 MG tablet Take 81 mg by mouth daily.  Calcium Carbonate-Vitamin D 600-200 MG-UNIT Tabs Take 1 tablet by mouth daily.   glimepiride 2 MG tablet Commonly known as: AMARYL Take 2 mg by mouth daily before breakfast.   Glucosamine 500 MG Caps Take 1 capsule by mouth daily.   hydrocortisone 2.5 % rectal  cream Commonly known as: ANUSOL-HC Place 1 application rectally 2 (two) times daily. Make sure cream is inside. What changed:   when to take this  reasons to take this   lisinopril 2.5 MG tablet Commonly known as: ZESTRIL Take 2.5 mg by mouth daily.   metFORMIN 500 MG 24 hr tablet Commonly known as: GLUCOPHAGE-XR TAKE TWO TABLETS BY MOUTH TWICE DAILY What changed:   how much to take  when to take this   multivitamin capsule Take 1 capsule by mouth daily.   OMEGA-3 FISH OIL PO Take 1 capsule by mouth daily.   OneTouch Verio Flex System w/Device Kit   oxybutynin 5 MG tablet Commonly known as: DITROPAN Take 1 tablet (5 mg total) by mouth 2 (two) times daily.   pioglitazone 45 MG tablet Commonly known as: ACTOS Take 45 mg by mouth daily.   risedronate 150 MG tablet Commonly known as: ACTONEL Take 1 tablet (150 mg total) by mouth every 30 (thirty) days. with water on empty stomach, nothing by mouth or lie down for next 30 minutes.   simvastatin 20 MG tablet Commonly known as: ZOCOR Take 1 tablet (20 mg total) by mouth at bedtime.   tamoxifen 20 MG tablet Commonly known as: NOLVADEX Take 1 tablet (20 mg total) by mouth daily.   triamcinolone cream 0.1 % Commonly known as: KENALOG Apply 1 application topically daily as needed (dry skin).   TURMERIC PO Take 1 capsule by mouth daily.   Victoza 18 MG/3ML Sopn Generic drug: liraglutide Inject 1.2 mg into the skin daily.      Follow-up Information    Donald Prose, MD Follow up in 1 week(s).   Specialty: Family Medicine Contact information: [place] [address] [phone]          Allergies  Allergen Reactions  . Shellfish Allergy Hives    unsure    Consultations:  None   Procedures/Studies: CT ABDOMEN PELVIS W CONTRAST  Result Date: 08/25/2019 CLINICAL DATA:  Acute abdominal pain. Flank pain and right lower quadrant pain. EXAM: CT ABDOMEN AND PELVIS WITH  CONTRAST TECHNIQUE: Multidetector CT imaging of the abdomen and pelvis was performed using the standard protocol following bolus administration of intravenous contrast. CONTRAST:  156m OMNIPAQUE IOHEXOL 300 MG/ML  SOLN COMPARISON:  CT 05/08/2019 FINDINGS: Lower chest: Acute airspace disease. Normal heart size with coronary artery calcifications. No pleural fluid. Hepatobiliary: No focal hepatic abnormality. Clips in the gallbladder fossa postcholecystectomy. No biliary dilatation. Pancreas: No ductal dilatation or inflammation. Spleen: Normal in size without focal abnormality. Adrenals/Urinary Tract: Normal adrenal glands. Heterogeneous right renal enhancement with areas of decreased enhancement, particularly in the upper pole. Mild right perinephric edema. No hydronephrosis. No focal renal fluid collection. Mild urinary bladder wall thickening and perivesicular stranding. Small cysts in the left kidney which is otherwise unremarkable. No left hydronephrosis. Stomach/Bowel: Nondistended stomach. No small bowel inflammation, wall thickening or obstruction. Normal appendix, series 2, image 50. Scattered high-density ingested material throughout the colon. Moderate colonic stool burden. No colonic wall thickening. No pericolonic edema. Previous soft tissue prominence in the region of the posterior rectum is not definitively seen. Vascular/Lymphatic: Moderate to advanced aortic atherosclerosis. No aneurysm. Portal  vein is patent. No adenopathy. Reproductive: Retroverted uterus. No adnexal mass. Other: No free air, free fluid, or intra-abdominal fluid collection. Small fat containing infraumbilical paramedian ventral abdominal wall hernia contains only fat. Musculoskeletal: There are no acute or suspicious osseous abnormalities. Degenerative change in the spine, prominent degenerative disc disease at L3-L4 appears similar. IMPRESSION: 1. Findings consistent with right pyelonephritis.  No renal abscess. 2. No other acute  findings.  Normal appendix. Aortic Atherosclerosis (ICD10-I70.0). Electronically Signed   By: Keith Rake M.D.   On: 08/25/2019 23:51   DG Chest Portable 1 View  Result Date: 08/25/2019 CLINICAL DATA:  Fevers EXAM: PORTABLE CHEST 1 VIEW COMPARISON:  07/09/2015 FINDINGS: Cardiac shadow is stable. The lungs are well aerated bilaterally. Postsurgical changes are noted in the right breast. No focal infiltrate or sizable effusion is seen. No bony abnormality is noted. IMPRESSION: No acute abnormality noted. Electronically Signed   By: Inez Catalina M.D.   On: 08/25/2019 22:22       Subjective: Patient denies suprapubic tenderness, diarrhea, nausea, dysuria, or CVA.   Discharge Exam: Vitals:   08/30/19 2013 08/31/19 0517  BP: 136/61 106/61  Pulse: 90 74  Resp: 18 18  Temp: 98.6 F (37 C) 98.3 F (36.8 C)  SpO2: 97% 96%   Vitals:   08/30/19 0544 08/30/19 1349 08/30/19 2013 08/31/19 0517  BP: 113/60 113/60 136/61 106/61  Pulse: 78 84 90 74  Resp: _0 Temp: 98.1 F (36.7 C) 98.1 F (36.7 C) 98.6 F (37 C) 98.3 F (36.8 C)  TempSrc: Oral Oral Oral Oral  SpO2: 98% 96% 97% 96%  Weight:      Height:        General: Pt is alert, awake, not in acute distress Cardiovascular: RRR Respiratory: No respiratory distress, no cough Abdominal: Soft, NT, ND Extremities: no edema, no cyanosis    The results of significant diagnostics from this hospitalization (including imaging, microbiology, ancillary and laboratory) are listed below for reference.     Microbiology: Recent Results (from the past 240 hour(s))  Urine C&S     Status: Abnormal   Collection Time: 08/25/19  7:52 PM   Specimen: Urine, Clean Catch  Result Value Ref Range Status   Specimen Description   Final    URINE, CLEAN CATCH Performed at [facility], [place] [address], [place], [place] [address]    Special Requests   Final    NONE Performed at [facility], [place] [address]  [place], [place] [address]    Culture (A)  Final    50,000 COLONIES/mL ESCHERICHIA COLI Confirmed Extended Spectrum Beta-Lactamase Producer (ESBL).  In bloodstream infections from ESBL organisms, carbapenems are preferred over piperacillin/tazobactam. They are shown to have a lower risk of mortality.    Report Status 08/27/2019 FINAL  Final   Organism ID, Bacteria ESCHERICHIA COLI (A)  Final      Susceptibility   Escherichia coli - MIC*    AMPICILLIN >=32 RESISTANT Resistant     CEFAZOLIN >=64 RESISTANT Resistant     CEFTRIAXONE >=64 RESISTANT Resistant     CIPROFLOXACIN <=0.25 SENSITIVE Sensitive     GENTAMICIN <=1 SENSITIVE Sensitive     IMIPENEM <=0.25 SENSITIVE Sensitive     NITROFURANTOIN 64 INTERMEDIATE Intermediate     TRIMETH/SULFA >=320 RESISTANT Resistant     AMPICILLIN/SULBACTAM 8 SENSITIVE Sensitive     PIP/TAZO <=4 SENSITIVE Sensitive     * 50,000 COLONIES/mL ESCHERICHIA COLI  Blood culture (  routine x 2)     Status: None   Collection Time: 08/25/19 10:44 PM   Specimen: BLOOD  Result Value Ref Range Status   Specimen Description   Final    BLOOD LEFT ANTECUBITAL Performed at [place] [address] [place], [place] [address]    Special Requests   Final    BOTTLES DRAWN AEROBIC AND ANAEROBIC Blood Culture adequate volume Performed at [place] [address] [place], [place] [address]    Culture   Final    NO GROWTH 5 DAYS Performed at [facility], [place] [address], [place], [place] [address]    Report Status 08/31/2019 FINAL  Final  Blood culture (routine x 2)     Status: Abnormal   Collection Time: 08/25/19 10:44 PM   Specimen: BLOOD  Result Value Ref Range Status   Specimen Description   Final    BLOOD BLOOD LEFT WRIST Performed at [facility] [address], [place], [place] [address]    Special Requests   Final    BOTTLES DRAWN AEROBIC AND ANAEROBIC Blood Culture adequate  volume Performed at [place] [address] [place], [place] [address]    Culture  Setup Time   Final    GRAM NEGATIVE RODS AEROBIC BOTTLE ONLY CRITICAL RESULT CALLED TO, READ BACK BY AND VERIFIED WITH: Christean Grief [facility] 08/26/19 1830 JDW Performed at [facility], [place] [address], [place], [place] [address]    Culture (A)  Final    ESCHERICHIA COLI Confirmed Extended Spectrum Beta-Lactamase Producer (ESBL).  In bloodstream infections from ESBL organisms, carbapenems are preferred over piperacillin/tazobactam. They are shown to have a lower risk of mortality.    Report Status 08/28/2019 FINAL  Final   Organism ID, Bacteria ESCHERICHIA COLI  Final      Susceptibility   Escherichia coli - MIC*    AMPICILLIN >=32 RESISTANT Resistant     CEFAZOLIN >=64 RESISTANT Resistant     CEFEPIME RESISTANT Resistant     CEFTAZIDIME RESISTANT Resistant     CEFTRIAXONE RESISTANT Resistant     CIPROFLOXACIN <=0.25 SENSITIVE Sensitive     GENTAMICIN <=1 SENSITIVE Sensitive     IMIPENEM <=0.25 SENSITIVE Sensitive     TRIMETH/SULFA >=320 RESISTANT Resistant     AMPICILLIN/SULBACTAM 4 SENSITIVE Sensitive     PIP/TAZO <=4 SENSITIVE Sensitive     * ESCHERICHIA COLI  Blood Culture ID Panel (Reflexed)     Status: Abnormal   Collection Time: 08/25/19 10:44 PM  Result Value Ref Range Status   Enterococcus species NOT DETECTED NOT DETECTED Final   Listeria monocytogenes NOT DETECTED NOT DETECTED Final   Staphylococcus species NOT DETECTED NOT DETECTED Final   Staphylococcus aureus (BCID) NOT DETECTED NOT DETECTED Final   Streptococcus species NOT DETECTED NOT DETECTED Final   Streptococcus agalactiae NOT DETECTED NOT DETECTED Final   Streptococcus pneumoniae NOT DETECTED NOT DETECTED Final   Streptococcus pyogenes NOT DETECTED NOT DETECTED Final   Acinetobacter baumannii NOT DETECTED NOT DETECTED Final   Enterobacteriaceae species DETECTED (A) NOT DETECTED Final    Comment:  Enterobacteriaceae represent a large family of gram-negative bacteria, not a single organism. CRITICAL RESULT CALLED TO, READ BACK BY AND VERIFIED WITH: J LEGGE PHARMD 08/26/19 1830 JDW    Enterobacter cloacae complex NOT DETECTED NOT DETECTED Final   Escherichia coli DETECTED (A) NOT DETECTED Final    Comment: CRITICAL RESULT CALLED TO, READ BACK BY  AND VERIFIED WITH: J LEGGE PHARMD 08/26/19 1830 JDW    Klebsiella oxytoca NOT DETECTED NOT DETECTED Final   Klebsiella pneumoniae NOT DETECTED NOT DETECTED Final   Proteus species NOT DETECTED NOT DETECTED Final   Serratia marcescens NOT DETECTED NOT DETECTED Final   Carbapenem resistance NOT DETECTED NOT DETECTED Final   Haemophilus influenzae NOT DETECTED NOT DETECTED Final   Neisseria meningitidis NOT DETECTED NOT DETECTED Final   Pseudomonas aeruginosa NOT DETECTED NOT DETECTED Final   Candida albicans NOT DETECTED NOT DETECTED Final   Candida glabrata NOT DETECTED NOT DETECTED Final   Candida krusei NOT DETECTED NOT DETECTED Final   Candida parapsilosis NOT DETECTED NOT DETECTED Final   Candida tropicalis NOT DETECTED NOT DETECTED Final    Comment: Performed at [facility], [place] [address], [place], [place] [address]  SARS CORONAVIRUS 2 (TAT 6-24 HRS) Nasopharyngeal Nasopharyngeal Swab     Status: None   Collection Time: 08/26/19 12:57 AM   Specimen: Nasopharyngeal Swab  Result Value Ref Range Status   SARS Coronavirus 2 NEGATIVE NEGATIVE Final    Comment: (NOTE) SARS-CoV-2 target nucleic acids are NOT DETECTED. The SARS-CoV-2 RNA is generally detectable in upper and lower respiratory specimens during the acute phase of infection. Negative results do not preclude SARS-CoV-2 infection, do not rule out co-infections with other pathogens, and should not be used as the sole basis for treatment or other patient management decisions. Negative results must be combined with clinical observations, patient history, and epidemiological  information. The expected result is Negative. Fact Sheet for Patients: SugarRoll.be Fact Sheet for Healthcare Providers: https://www.woods-mathews.com/ This test is not yet approved or cleared by the [place] FDA and  has been authorized for detection and/or diagnosis of SARS-CoV-2 by FDA under an Emergency Use Authorization (EUA). This EUA will remain  in effect (meaning this test can be used) for the duration of the COVID-19 declaration under Section 56 4(b)(1) of the Act, 21 U.S.C. section 360bbb-3(b)(1), unless the authorization is terminated or revoked sooner. Performed at [facility], [place] [address], [place], [place] [address]   Culture, blood (routine x 2)     Status: None (Preliminary result)   Collection Time: 08/27/19  7:26 PM   Specimen: BLOOD  Result Value Ref Range Status   Specimen Description   Final    BLOOD RIGHT ARM Performed at [facility] [address], [place], [place] [address]    Special Requests   Final    BOTTLES DRAWN AEROBIC AND ANAEROBIC Blood Culture adequate volume Performed at [facility] [place] [address] [place], [place] [address]    Culture   Final    NO GROWTH 4 DAYS Performed at [facility], [place] [address] [place], [place] [address]    Report Status PENDING  Incomplete  Culture, blood (routine x 2)     Status: None (Preliminary result)   Collection Time: 08/27/19  7:26 PM   Specimen: BLOOD  Result Value Ref Range Status   Specimen Description   Final    BLOOD RIGHT ARM Performed at [facility] [address], [place], [place] [address]    Special Requests   Final    BOTTLES DRAWN AEROBIC AND ANAEROBIC Blood Culture adequate volume Performed at [place] [name] [place],  [address]    Culture   Final    NO GROWTH 4 DAYS Performed at [facility], [address]  [place], [place] [address]    Report Status PENDING  Incomplete     Labs: BNP (last 3 results) No results for input(s): BNP in the last 8760 hours. Basic Metabolic Panel: Recent Labs  Lab 08/25/19 2110 08/26/19 0453 08/26/19 1401 08/28/19 0457 08/29/19 0449  NA 131* 135 131* 137 139  K 3.9 3.9 3.6 3.8 3.9  CL 97* 104 102 107 107  CO2 21* 18* 20* 21* 23  GLUCOSE 251* 196* 317* 267* 191*  BUN _0 CREATININE 0.76 0.64 0.74 0.59 0.56  CALCIUM 8.9 8.5* 7.8* 8.4* 8.4*   Liver Function Tests: Recent Labs  Lab 08/25/19 2110  AST 34  ALT 49*  ALKPHOS 52  BILITOT 0.8  PROT 8.0  ALBUMIN 3.8   Recent Labs  Lab 08/25/19 2110  LIPASE 28   No results for input(s): AMMONIA in the last 168 hours. CBC: Recent Labs  Lab 08/25/19 2110 08/26/19 0453 08/26/19 1401 08/28/19 0457 08/29/19 0449  WBC 15.4* 16.1* 14.0* 7.5 6.9  HGB 12.9 12.8 10.4* 11.5* 11.0*  HCT 38.2 37.2 31.2* 33.2* 32.3*  MCV 94.6 93.0 94.3 89.5 92.0  PLT 258 217 201 246 292   Cardiac Enzymes: No results for input(s): CKTOTAL, CKMB, CKMBINDEX, TROPONINI in the last 168 hours. BNP: Invalid input(s): POCBNP CBG: Recent Labs  Lab 08/30/19 1225 08/30/19 1641 08/30/19 2142 08/31/19 0754 08/31/19 1152  GLUCAP 363* 269* 313* 201* 272*   D-Dimer No results for input(s): DDIMER in the last 72 hours. Hgb A1c No results for input(s): HGBA1C in the last 72 hours. Lipid Profile No results for input(s): CHOL, HDL, LDLCALC, TRIG, CHOLHDL, LDLDIRECT in the last 72 hours. Thyroid function studies No results for input(s): TSH, T4TOTAL, T3FREE, THYROIDAB in the last 72 hours.  Invalid input(s): FREET3 Anemia work up No results for input(s): VITAMINB12, FOLATE, FERRITIN, TIBC, IRON, RETICCTPCT in the last 72 hours. Urinalysis    Component Value Date/Time   COLORURINE YELLOW 08/25/2019 1952   APPEARANCEUR CLOUDY (A) 08/25/2019 1952   LABSPEC 1.016 08/25/2019 1952   PHURINE 5.0 08/25/2019 1952    GLUCOSEU >=500 (A) 08/25/2019 1952   HGBUR SMALL (A) 08/25/2019 1952   BILIRUBINUR NEGATIVE 08/25/2019 1952   BILIRUBINUR negative 09/27/2015 2103   KETONESUR 80 (A) 08/25/2019 1952   PROTEINUR 100 (A) 08/25/2019 1952   UROBILINOGEN 0.2 09/27/2015 2103   UROBILINOGEN 0.2 04/19/2008 1937   NITRITE NEGATIVE 08/25/2019 1952   LEUKOCYTESUR LARGE (A) 08/25/2019 1952   Sepsis Labs Invalid input(s): PROCALCITONIN,  WBC,  LACTICIDVEN Microbiology Recent Results (from the past 240 hour(s))  Urine C&S     Status: Abnormal   Collection Time: 08/25/19  7:52 PM   Specimen: Urine, Clean Catch  Result Value Ref Range Status   Specimen Description   Final    URINE, CLEAN CATCH Performed at [facility], [place] [address] [place], [place] [address]    Special Requests   Final    NONE Performed at [facility], [place] [address] [place],  [address]    Culture (A)  Final    50,000 COLONIES/mL ESCHERICHIA COLI Confirmed Extended Spectrum Beta-Lactamase Producer (ESBL).  In bloodstream infections from ESBL organisms, carbapenems are preferred over piperacillin/tazobactam. They are shown to have a lower risk of mortality.    Report Status 08/27/2019 FINAL  Final   Organism ID, Bacteria ESCHERICHIA COLI (A)  Final      Susceptibility   Escherichia coli -  MIC*    AMPICILLIN >=32 RESISTANT Resistant     CEFAZOLIN >=64 RESISTANT Resistant     CEFTRIAXONE >=64 RESISTANT Resistant     CIPROFLOXACIN <=0.25 SENSITIVE Sensitive     GENTAMICIN <=1 SENSITIVE Sensitive     IMIPENEM <=0.25 SENSITIVE Sensitive     NITROFURANTOIN 64 INTERMEDIATE Intermediate     TRIMETH/SULFA >=320 RESISTANT Resistant     AMPICILLIN/SULBACTAM 8 SENSITIVE Sensitive     PIP/TAZO <=4 SENSITIVE Sensitive     * 50,000 COLONIES/mL ESCHERICHIA COLI  Blood culture (routine x 2)     Status: None   Collection Time: 08/25/19 10:44 PM   Specimen: BLOOD  Result Value Ref Range Status   Specimen  Description   Final    BLOOD LEFT ANTECUBITAL Performed at [facility] [address], [place], [place] [address]    Special Requests   Final    BOTTLES DRAWN AEROBIC AND ANAEROBIC Blood Culture adequate volume Performed at [place] [address] [place], [place] [address]    Culture   Final    NO GROWTH 5 DAYS Performed at [facility], [place] [address], [place], [place] [address]    Report Status 08/31/2019 FINAL  Final  Blood culture (routine x 2)     Status: Abnormal   Collection Time: 08/25/19 10:44 PM   Specimen: BLOOD  Result Value Ref Range Status   Specimen Description   Final    BLOOD BLOOD LEFT WRIST Performed at [place] [address], [place], [place] [address]    Special Requests   Final    BOTTLES DRAWN AEROBIC AND ANAEROBIC Blood Culture adequate volume Performed at [facility] [address], [place], [place] [address]    Culture  Setup Time   Final    GRAM NEGATIVE RODS AEROBIC BOTTLE ONLY CRITICAL RESULT CALLED TO, READ BACK BY AND VERIFIED WITH: Christean Grief [facility] 08/26/19 1830 JDW Performed at [facility], [place] [address], [place], [place] [address]    Culture (A)  Final    ESCHERICHIA COLI Confirmed Extended Spectrum Beta-Lactamase Producer (ESBL).  In bloodstream infections from ESBL organisms, carbapenems are preferred over piperacillin/tazobactam. They are shown to have a lower risk of mortality.    Report Status 08/28/2019 FINAL  Final   Organism ID, Bacteria ESCHERICHIA COLI  Final      Susceptibility   Escherichia coli - MIC*    AMPICILLIN >=32 RESISTANT Resistant     CEFAZOLIN >=64 RESISTANT Resistant     CEFEPIME RESISTANT Resistant     CEFTAZIDIME RESISTANT Resistant     CEFTRIAXONE RESISTANT Resistant     CIPROFLOXACIN <=0.25 SENSITIVE Sensitive     GENTAMICIN <=1 SENSITIVE Sensitive     IMIPENEM <=0.25 SENSITIVE Sensitive     TRIMETH/SULFA  >=320 RESISTANT Resistant     AMPICILLIN/SULBACTAM 4 SENSITIVE Sensitive     PIP/TAZO <=4 SENSITIVE Sensitive     * ESCHERICHIA COLI  Blood Culture ID Panel (Reflexed)     Status: Abnormal   Collection Time: 08/25/19 10:44 PM  Result Value Ref Range Status   Enterococcus species NOT DETECTED NOT DETECTED Final   Listeria monocytogenes NOT DETECTED NOT DETECTED Final   Staphylococcus species NOT DETECTED NOT DETECTED Final   Staphylococcus aureus (BCID) NOT DETECTED NOT DETECTED Final   Streptococcus species NOT DETECTED NOT DETECTED Final   Streptococcus agalactiae NOT DETECTED NOT DETECTED Final   Streptococcus pneumoniae NOT DETECTED NOT  DETECTED Final   Streptococcus pyogenes NOT DETECTED NOT DETECTED Final   Acinetobacter baumannii NOT DETECTED NOT DETECTED Final   Enterobacteriaceae species DETECTED (A) NOT DETECTED Final    Comment: Enterobacteriaceae represent a large family of gram-negative bacteria, not a single organism. CRITICAL RESULT CALLED TO, READ BACK BY AND VERIFIED WITH: J LEGGE PHARMD 08/26/19 1830 JDW    Enterobacter cloacae complex NOT DETECTED NOT DETECTED Final   Escherichia coli DETECTED (A) NOT DETECTED Final    Comment: CRITICAL RESULT CALLED TO, READ BACK BY AND VERIFIED WITH: J LEGGE PHARMD 08/26/19 1830 JDW    Klebsiella oxytoca NOT DETECTED NOT DETECTED Final   Klebsiella pneumoniae NOT DETECTED NOT DETECTED Final   Proteus species NOT DETECTED NOT DETECTED Final   Serratia marcescens NOT DETECTED NOT DETECTED Final   Carbapenem resistance NOT DETECTED NOT DETECTED Final   Haemophilus influenzae NOT DETECTED NOT DETECTED Final   Neisseria meningitidis NOT DETECTED NOT DETECTED Final   Pseudomonas aeruginosa NOT DETECTED NOT DETECTED Final   Candida albicans NOT DETECTED NOT DETECTED Final   Candida glabrata NOT DETECTED NOT DETECTED Final   Candida krusei NOT DETECTED NOT DETECTED Final   Candida parapsilosis NOT DETECTED NOT DETECTED Final   Candida  tropicalis NOT DETECTED NOT DETECTED Final    Comment: Performed at [facility], [place] [address] [place], [place] [address]  SARS CORONAVIRUS 2 (TAT 6-24 HRS) Nasopharyngeal Nasopharyngeal Swab     Status: None   Collection Time: 08/26/19 12:57 AM   Specimen: Nasopharyngeal Swab  Result Value Ref Range Status   SARS Coronavirus 2 NEGATIVE NEGATIVE Final    Comment: (NOTE) SARS-CoV-2 target nucleic acids are NOT DETECTED. The SARS-CoV-2 RNA is generally detectable in upper and lower respiratory specimens during the acute phase of infection. Negative results do not preclude SARS-CoV-2 infection, do not rule out co-infections with other pathogens, and should not be used as the sole basis for treatment or other patient management decisions. Negative results must be combined with clinical observations, patient history, and epidemiological information. The expected result is Negative. Fact Sheet for Patients: SugarRoll.be Fact Sheet for Healthcare Providers: https://www.woods-mathews.com/ This test is not yet approved or cleared by the [place] FDA and  has been authorized for detection and/or diagnosis of SARS-CoV-2 by FDA under an Emergency Use Authorization (EUA). This EUA will remain  in effect (meaning this test can be used) for the duration of the COVID-19 declaration under Section 56 4(b)(1) of the Act, 21 U.S.C. section 360bbb-3(b)(1), unless the authorization is terminated or revoked sooner. Performed at [facility], [place] [address], [place], [place] [address]   Culture, blood (routine x 2)     Status: None (Preliminary result)   Collection Time: 08/27/19  7:26 PM   Specimen: BLOOD  Result Value Ref Range Status   Specimen Description   Final    BLOOD RIGHT ARM Performed at [facility] [address] [place], [place] [address]    Special Requests   Final    BOTTLES DRAWN AEROBIC AND ANAEROBIC  Blood Culture adequate volume Performed at [facility] [address] [place], [place] [address]    Culture   Final    NO GROWTH 4 DAYS Performed at [facility], [place] [address] [place], [place] [address]    Report Status PENDING  Incomplete  Culture, blood (routine x 2)     Status: None (Preliminary result)   Collection Time: 08/27/19  7:26 PM   Specimen: BLOOD  Result Value Ref Range Status   Specimen Description   Final    BLOOD RIGHT ARM Performed at [place] [address], [place], [place] [address]    Special Requests   Final    BOTTLES DRAWN AEROBIC AND ANAEROBIC Blood Culture adequate volume Performed at [facility] [address], [place], [place] [address]    Culture   Final    NO GROWTH 4 DAYS Performed at [facility], [place] [address], [place], [place] [address]    Report Status PENDING  Incomplete     Time coordinating discharge: Over 33 minutes  SIGNED:   Blain Pais, MD  [facility] 08/31/2019, 9:05 PM   If 7PM-7AM, please contact night-coverage [email or web address] Password [email or web address]

## 2019-09-01 LAB — CULTURE, BLOOD (ROUTINE X 2)
Culture: NO GROWTH
Culture: NO GROWTH
Special Requests: ADEQUATE
Special Requests: ADEQUATE

## 2019-09-02 NOTE — Telephone Encounter (Signed)
Patient scheduled on 09/17/19 with [facility]

## 2019-09-24 ENCOUNTER — Other Ambulatory Visit: Payer: Self-pay | Admitting: Urology

## 2019-09-24 DIAGNOSIS — R1031 Right lower quadrant pain: Secondary | ICD-10-CM

## 2019-10-02 ENCOUNTER — Ambulatory Visit
Admission: RE | Admit: 2019-10-02 | Discharge: 2019-10-02 | Disposition: A | Discharge status: Home / Self Care | Payer: Medicare Other | Source: Ambulatory Visit | Attending: Urology | Admitting: Urology

## 2019-10-02 DIAGNOSIS — R1031 Right lower quadrant pain: Secondary | ICD-10-CM

## 2019-10-08 ENCOUNTER — Other Ambulatory Visit: Payer: Self-pay

## 2019-10-12 ENCOUNTER — Other Ambulatory Visit: Payer: Self-pay

## 2019-10-12 ENCOUNTER — Encounter: Payer: Self-pay | Admitting: Obstetrics and Gynecology

## 2019-10-12 ENCOUNTER — Ambulatory Visit (INDEPENDENT_AMBULATORY_CARE_PROVIDER_SITE_OTHER): Payer: Medicare Other | Admitting: Obstetrics and Gynecology

## 2019-10-12 VITALS — BP 122/76

## 2019-10-12 DIAGNOSIS — N858 Other specified noninflammatory disorders of uterus: Secondary | ICD-10-CM

## 2019-10-12 DIAGNOSIS — N859 Noninflammatory disorder of uterus, unspecified: Secondary | ICD-10-CM

## 2019-10-12 DIAGNOSIS — N83202 Unspecified ovarian cyst, left side: Secondary | ICD-10-CM

## 2019-10-12 NOTE — Progress Notes (Signed)
Destiny Moody  1951/03/07 [id number]  HPI Destiny Moody is a 69 y.o. G2P2 with a history of type II diabetes mellitus and breast cancer on tamoxifen therapy who presents today for discussion of pelvic ultrasound results.  On 10/02/2019 the pelvic ultrasound showed an atrophic uterus and a tiny left ovarian cyst measuring 7 mm in diameter, the right ovary was not visualized.  Uterus was not well visualized and there were tiny scattered calcifications with the endometrial thickness of 3 mm.  There is mention of a tiny cystic focal lesion within the endometrium. 08/25/2019 CT abdomen and pelvis did not show any adnexal mass.  She had been admitted to the hospital about 6 weeks ago for pyelonephritis and sepsis due to ESBL E. coli UTI.   She did continue to have right abdominal pain after discharge.  She reports a normal colonoscopy.  She indicates she was having increased urinary frequency and has had urinary accidents.  She went to urology and she had a cystoscopy which was reportedly negative.  She was started on antibiotics indefinitely and her abdominal pain and urinary frequency symptoms have much improved.  They recommended a pelvic ultrasound due to her symptoms.  Denies urinary leakage with cough, sneeze, or laugh.  She gets up 1-2 times overnight to urinate.  Dr. Jacalyn Lefevre.  She is not having any vaginal bleeding or abnormal discharge.    Current Outpatient Medications on File Prior to Visit  Medication Sig Dispense Refill  . acetaminophen (TYLENOL) 325 MG tablet Take 2 tablets (650 mg total) by mouth every 6 (six) hours as needed for mild pain (or Fever >/= 101).    Marland Kitchen amitriptyline (ELAVIL) 25 MG tablet TAKE ONE TABLET BY MOUTH AT BEDTIME  "OFFICE VISIT NEEDED" (Patient taking differently: Take 25 mg by mouth at bedtime. ) 90 tablet 3  . aspirin 81 MG tablet Take 81 mg by mouth daily.    . Blood Glucose Monitoring Suppl ([place]) w/Device KIT     . Calcium Carbonate-Vitamin  D 600-200 MG-UNIT TABS Take 1 tablet by mouth daily.     Marland Kitchen glimepiride (AMARYL) 2 MG tablet Take 2 mg by mouth daily before breakfast.    . Glucosamine 500 MG CAPS Take 1 capsule by mouth daily.    . hydrocortisone (ANUSOL-HC) 2.5 % rectal cream Place 1 application rectally 2 (two) times daily. Make sure cream is inside. (Patient taking differently: Place 1 application rectally daily as needed for hemorrhoids. Make sure cream is inside.) 30 g 1  . liraglutide (VICTOZA) 18 MG/3ML SOPN Inject 1.2 mg into the skin daily.    Marland Kitchen lisinopril (ZESTRIL) 2.5 MG tablet Take 2.5 mg by mouth daily.    . metFORMIN (GLUCOPHAGE-XR) 500 MG 24 hr tablet TAKE TWO TABLETS BY MOUTH TWICE DAILY (Patient taking differently: Take 2,000 mg by mouth every morning. ) 360 tablet 0  . Multiple Vitamin (MULTIVITAMIN) capsule Take 1 capsule by mouth daily.    . Omega-3 Fatty Acids (OMEGA-3 FISH OIL PO) Take 1 capsule by mouth daily.     Marland Kitchen oxybutynin (DITROPAN) 5 MG tablet Take 1 tablet (5 mg total) by mouth 2 (two) times daily. 60 tablet 0  . pioglitazone (ACTOS) 45 MG tablet Take 45 mg by mouth daily.    . risedronate (ACTONEL) 150 MG tablet Take 1 tablet (150 mg total) by mouth every 30 (thirty) days. with water on empty stomach, nothing by mouth or lie down for next 30 minutes. 1 tablet  12  . simvastatin (ZOCOR) 20 MG tablet Take 1 tablet (20 mg total) by mouth at bedtime. 90 tablet 3  . tamoxifen (NOLVADEX) 20 MG tablet Take 1 tablet (20 mg total) by mouth daily. 90 tablet 4  . triamcinolone cream (KENALOG) 0.1 % Apply 1 application topically daily as needed (dry skin).     Marland Kitchen trimethoprim (TRIMPEX) 100 MG tablet Take 100 mg by mouth 2 (two) times daily.    . TURMERIC PO Take 1 capsule by mouth daily.     No current facility-administered medications on file prior to visit.      Past medical history,surgical history, problem list, medications, allergies, family history and social history were all reviewed and documented as  reviewed in the EPIC chart.  ROS:  Feeling well. No dyspnea or chest pain on exertion.  No abdominal pain, change in bowel habits, black or bloody stools.  No urinary tract symptoms. GYN ROS: no abnormal bleeding, pelvic pain or discharge, no breast pain or new or enlarging lumps on self exam. No neurological complaints.  Physical Exam  BP 122/76   General: Pleasant female, no acute distress, alert and oriented Remainder of exam deferred as patient is without symptoms at this time.   Assessment Small left ovarian cyst, probable endometrial polyps, right lower quadrant pain resolved  Plan We had a discussion regarding the ultrasound findings today.  Overall the results are not overly concerning, her endometrium is within a normal thickness range for being postmenopausal and this is especially good as she is on tamoxifen.  The ultrasound images are personally reviewed and I agree with the radiologist interpretation.  She does have probable endometrial polyps but she is not having any vaginal bleeding, and this does not need to be further evaluated at this point.  She is advised if she does develop any vaginal bleeding she needs to let us know right away so we can perform tissue sampling.  She has very small left ovarian cysts and I recommended to repeat a pelvic ultrasound in 6 months to evaluate this.  Again being contralateral to the side where her pain originally was before it got better, I do not think any of the findings on the pelvic ultrasound explain the symptoms that she was previously having.  At this point she does feel much better taking the antibiotics that were prescribed to her from urology, so I think symptoms surveillance is reasonable at this time.  Her urinary frequency has further decreased after recovering from the urinary tract infection and pyelonephritis, and she is not experiencing any significant nocturia.  She seems reassured by our conversation today and all of her  questions were answered to her satisfaction.  30 minutes were spent in this encounter, including time to review previous records and independent interpretation of pelvic ultrasound images  Larey Days MD 10/12/19

## 2019-10-12 NOTE — Patient Instructions (Signed)
Please schedule a pelvic ultrasound in 6 months to follow up the small left ovarian cyst. Let us know if you develop any vaginal bleeding.

## 2019-11-18 DIAGNOSIS — M5136 Other intervertebral disc degeneration, lumbar region: Secondary | ICD-10-CM | POA: Insufficient documentation

## 2019-11-18 DIAGNOSIS — M419 Scoliosis, unspecified: Secondary | ICD-10-CM | POA: Insufficient documentation

## 2019-11-18 DIAGNOSIS — Z853 Personal history of malignant neoplasm of breast: Secondary | ICD-10-CM

## 2019-12-31 ENCOUNTER — Encounter (HOSPITAL_COMMUNITY): Payer: Self-pay

## 2019-12-31 ENCOUNTER — Other Ambulatory Visit: Payer: Self-pay

## 2019-12-31 ENCOUNTER — Emergency Department (HOSPITAL_COMMUNITY)
Admission: EM | Admit: 2019-12-31 | Discharge: 2020-01-01 | Disposition: A | Discharge status: Home / Self Care | Payer: Medicare Other | Attending: Emergency Medicine | Admitting: Emergency Medicine

## 2019-12-31 DIAGNOSIS — Z853 Personal history of malignant neoplasm of breast: Secondary | ICD-10-CM | POA: Insufficient documentation

## 2019-12-31 DIAGNOSIS — R42 Dizziness and giddiness: Secondary | ICD-10-CM | POA: Diagnosis present

## 2019-12-31 DIAGNOSIS — Z79899 Other long term (current) drug therapy: Secondary | ICD-10-CM | POA: Diagnosis not present

## 2019-12-31 DIAGNOSIS — Z7984 Long term (current) use of oral hypoglycemic drugs: Secondary | ICD-10-CM | POA: Insufficient documentation

## 2019-12-31 DIAGNOSIS — I129 Hypertensive chronic kidney disease with stage 1 through stage 4 chronic kidney disease, or unspecified chronic kidney disease: Secondary | ICD-10-CM | POA: Diagnosis not present

## 2019-12-31 DIAGNOSIS — E039 Hypothyroidism, unspecified: Secondary | ICD-10-CM | POA: Insufficient documentation

## 2019-12-31 DIAGNOSIS — Z7982 Long term (current) use of aspirin: Secondary | ICD-10-CM | POA: Diagnosis not present

## 2019-12-31 DIAGNOSIS — E1165 Type 2 diabetes mellitus with hyperglycemia: Secondary | ICD-10-CM | POA: Insufficient documentation

## 2019-12-31 DIAGNOSIS — N189 Chronic kidney disease, unspecified: Secondary | ICD-10-CM | POA: Diagnosis not present

## 2019-12-31 DIAGNOSIS — R739 Hyperglycemia, unspecified: Secondary | ICD-10-CM

## 2019-12-31 LAB — URINALYSIS, ROUTINE W REFLEX MICROSCOPIC
Bilirubin Urine: NEGATIVE
Glucose, UA: >=500 mg/dL — AB
Hgb urine dipstick: NEGATIVE
Ketones, ur: 20 mg/dL — AB
Leukocytes,Ua: NEGATIVE
Nitrite: NEGATIVE
Protein, ur: NEGATIVE mg/dL
Specific Gravity, Urine: 1.025 (ref 1.005–1.030)
pH: 5 (ref 5.0–8.0)

## 2019-12-31 LAB — BASIC METABOLIC PANEL
Anion gap: 11 (ref 5–15)
BUN: 27 mg/dL — ABNORMAL HIGH (ref 8–23)
CO2: 20 mmol/L — ABNORMAL LOW (ref 22–32)
Calcium: 9.5 mg/dL (ref 8.9–10.3)
Chloride: 103 mmol/L (ref 98–111)
Creatinine, Ser: 0.82 mg/dL (ref 0.44–1.00)
GFR calc Af Amer: >60 mL/min (ref >60)
GFR calc non Af Amer: >60 mL/min (ref >60)
Glucose, Bld: 438 mg/dL — ABNORMAL HIGH (ref 70–99)
Potassium: 4.7 mmol/L (ref 3.5–5.1)
Sodium: 134 mmol/L — ABNORMAL LOW (ref 135–145)

## 2019-12-31 LAB — CBC
HCT: 38.2 % (ref 36.0–46.0)
Hemoglobin: 12.7 g/dL (ref 12.0–15.0)
MCH: 31.9 pg (ref 26.0–34.0)
MCHC: 33.2 g/dL (ref 30.0–36.0)
MCV: 96 fL (ref 80.0–100.0)
Platelets: 248 10*3/uL (ref 150–400)
RBC: 3.98 MIL/uL (ref 3.87–5.11)
RDW: 12.6 % (ref 11.5–15.5)
WBC: 10.6 10*3/uL — ABNORMAL HIGH (ref 4.0–10.5)
nRBC: 0 % (ref 0.0–0.2)

## 2019-12-31 LAB — CBG MONITORING, ED: Glucose-Capillary: 430 mg/dL — ABNORMAL HIGH (ref 70–99)

## 2019-12-31 MED ORDER — SODIUM CHLORIDE 0.9 % IV BOLUS
1000.0000 mL | Freq: Once | INTRAVENOUS | Status: AC
  Administered 2020-01-01: 1000 mL via INTRAVENOUS

## 2019-12-31 MED ORDER — INSULIN ASPART 100 UNIT/ML ~~LOC~~ SOLN
5.0000 [IU] | Freq: Once | SUBCUTANEOUS | Status: AC
  Administered 2020-01-01: 5 [IU] via INTRAVENOUS
  Filled 2019-12-31: qty 0.05

## 2019-12-31 NOTE — ED Provider Notes (Signed)
[facility] DEPT Provider Note   CSN: [id number] Arrival date & time: 12/31/19  2057   History Chief Complaint  Patient presents with  . Hyperglycemia    Destiny Moody is a 69 y.o. female.  The history is provided by the patient.  Hyperglycemia She has history of hypertension, diabetes, hyperlipidemia, chronic kidney disease and comes in because of high blood sugar. This morning, her glucose was 150 and she had an epidural injection in her spine. When she got home, she went to sleep and, when she woke up, she stated that she was feeling dizzy and her glucose was over 400. She denies fever, chills, sweats. She denies nausea or vomiting. She is not hurting anywhere except in her back where she had the steroid injection. She does admit to some urinary frequency. She went to urgent care who referred her to the emergency department.  Past Medical History:  Diagnosis Date  . Allergy   . Arthritis   . Breast cancer ([place]) 08/24/14   right, upper inner  . Cancer ([place])   . Chronic kidney disease   . Depression    no meds  . HSV (herpes simplex virus) anogenital infection 2018  . Hypercholesterolemia   . Hypertension   . Hypothyroidism   . Multinodular thyroid   . NIDDM (non-insulin dependent diabetes mellitus)   . Osteoporosis 2020   T score -2.5 stable from prior DEXA  . Radiation 01/31/15-03/01/15   right  breast  . Rheumatoid arthritis(714.0)    lower back  . Wears contact lenses     Patient Active Problem List   Diagnosis Date Noted  . Sepsis due to urinary tract infection ([place]) 08/26/2019  . Acute pyelonephritis 08/26/2019  . Sepsis secondary to UTI ([place]) 05/08/2019  . Hyperlipidemia associated with type 2 diabetes mellitus ([place])   . History of breast cancer   . Hypertension   . Hypothyroidism   . Age-related osteoporosis without current pathological fracture 12/27/2016  . Genital herpes in women 12/20/2016  . Wrist pain, chronic, right  08/01/2016  . Genetic testing 04/27/2015  . Family history of colon cancer   . Family history of ovarian cancer   . Diabetes mellitus type II, non insulin dependent ([place]) 03/31/2015  . Malignant neoplasm of upper-inner quadrant of right breast in female, estrogen receptor positive ([place]) 08/26/2014  . Osteoporosis 08/10/2014  . Vaginal atrophy 04/27/2014  . Diabetes ([place]) 04/17/2011  . GERD 04/07/2008  . FATTY LIVER DISEASE 04/07/2008    Past Surgical History:  Procedure Laterality Date  . BREAST LUMPECTOMY Right 2016  . BREAST SURGERY     Lumpectomy  . CESAREAN SECTION  [id number]   x 2  . CHOLECYSTECTOMY  1993  . COLONOSCOPY  last 06/15/2016   greater than 12 years but not sure where colonoscopy was performed  . INCISION AND DRAINAGE ABSCESS Right 11/24/2014   Procedure: INCISION AND DRAINAGE RIGHT AXILLA  ABSCESS;  Surgeon: Fanny Skates, MD;  Location: [facility];  Service: General;  Laterality: Right;  . RADIOACTIVE SEED GUIDED PARTIAL MASTECTOMY WITH AXILLARY SENTINEL LYMPH NODE BIOPSY Right 10/12/2014   Procedure: RIGHT  PARTIAL MASTECTOMY WITH RADIOACTIVE SEED LOCALIZATION  RIGHT  AXILLARY SENTINEL  NODE BIOPSY;  Surgeon: Fanny Skates, MD;  Location: [facility];  Service: General;  Laterality: Right;  . TUBAL LIGATION  1996   re annastomosis  . WISDOM TOOTH EXTRACTION       OB History    Gravida  2  Para  2   Term      Preterm      AB      Living  2     SAB      TAB      Ectopic      Multiple      Live Births              Family History  Problem Relation Age of Onset  . Diabetes Mother        niddm  . Heart disease Mother   . Heart failure Father   . Heart disease Father   . Ovarian cancer Cousin 50  . Hyperlipidemia Sister   . Colon cancer Maternal Uncle 38  . Esophageal cancer Neg Hx   . Rectal cancer Neg Hx   . Stomach cancer Neg Hx   . Colon polyps Neg Hx     Social History   Tobacco Use  . Smoking status: Never  Smoker  . Smokeless tobacco: Never Used  Substance Use Topics  . Alcohol use: Not Currently    Alcohol/week: 0.0 standard drinks  . Drug use: No    Home Medications Prior to Admission medications   Medication Sig Start Date End Date Taking? Authorizing Provider  acetaminophen (TYLENOL) 325 MG tablet Take 2 tablets (650 mg total) by mouth every 6 (six) hours as needed for mild pain (or Fever >/= 101). 08/31/19  Yes Adele Barthel D, MD  amitriptyline (ELAVIL) 25 MG tablet TAKE ONE TABLET BY MOUTH AT BEDTIME  "OFFICE VISIT NEEDED" Patient taking differently: Take 25 mg by mouth at bedtime as needed for sleep.  10/18/15  Yes Copland, Gay Filler, MD  aspirin 81 MG tablet Take 81 mg by mouth daily.   Yes [provider]  Calcium Carbonate-Vitamin D 600-200 MG-UNIT TABS Take 1 tablet by mouth daily.    Yes [provider]  glimepiride (AMARYL) 2 MG tablet Take 2 mg by mouth daily before breakfast. 07/29/19  Yes [provider]  Glucosamine 500 MG CAPS Take 1 capsule by mouth daily.   Yes [provider]  hydrocortisone (ANUSOL-HC) 2.5 % rectal cream Place 1 application rectally 2 (two) times daily. Make sure cream is inside. Patient taking differently: Place 1 application rectally daily as needed for hemorrhoids. Make sure cream is inside. 05/21/19  Yes Willia Craze, NP  liraglutide (VICTOZA) 18 MG/3ML SOPN Inject 1.2 mg into the skin daily.   Yes [provider]  lisinopril (ZESTRIL) 2.5 MG tablet Take 2.5 mg by mouth daily. 07/07/19  Yes [provider]  metFORMIN (GLUCOPHAGE-XR) 500 MG 24 hr tablet TAKE TWO TABLETS BY MOUTH TWICE DAILY Patient taking differently: Take 1,000 mg by mouth in the morning and at bedtime.  10/28/16  Yes Forrest Moron, MD  Multiple Vitamin (MULTIVITAMIN) capsule Take 1 capsule by mouth daily.   Yes [provider]  Omega-3 Fatty Acids (OMEGA-3 FISH OIL PO) Take 1 capsule by mouth daily.    Yes  [provider]  oxybutynin (DITROPAN) 5 MG tablet Take 1 tablet (5 mg total) by mouth 2 (two) times daily. 08/31/19  Yes Adele Barthel D, MD  pioglitazone (ACTOS) 45 MG tablet Take 45 mg by mouth daily. 07/11/19  Yes [provider]  risedronate (ACTONEL) 150 MG tablet Take 1 tablet (150 mg total) by mouth every 30 (thirty) days. with water on empty stomach, nothing by mouth or lie down for next 30 minutes. 02/02/19  Yes  Fontaine, Belinda Block, MD  simvastatin (ZOCOR) 20 MG tablet Take 1 tablet (20 mg total) by mouth at bedtime. 10/18/15  Yes Copland, Gay Filler, MD  tamoxifen (NOLVADEX) 20 MG tablet Take 1 tablet (20 mg total) by mouth daily. 02/26/19  Yes Magrinat, Virgie Dad, MD  triamcinolone cream (KENALOG) 0.1 % Apply 1 application topically daily as needed (dry skin).  03/26/19  Yes [provider]  trimethoprim (TRIMPEX) 100 MG tablet Take 100 mg by mouth 2 (two) times daily.   Yes [provider]  TURMERIC PO Take 1 capsule by mouth daily.   Yes [provider]  Blood Glucose Monitoring Suppl ([place]) w/Device KIT  06/03/19   [provider]    Allergies    Shellfish allergy  Review of Systems   Review of Systems  All other systems reviewed and are negative.   Physical Exam Updated Vital Signs BP 125/73 (BP Location: Left Arm)   Pulse 92   Temp 98.2 F (36.8 C) (Oral)   Resp 18   Ht 5' 2" (1.575 m)   Wt 61.2 kg   SpO2 100%   BMI 24.69 kg/m   Physical Exam Vitals and nursing note reviewed.   69 year old female, resting comfortably and in no acute distress. Vital signs are normal. Oxygen saturation is 100%, which is normal. Head is normocephalic and atraumatic. PERRLA, EOMI. Oropharynx is clear. Neck is nontender and supple without adenopathy or JVD. Back is nontender and there is no CVA tenderness. Lungs are clear without rales, wheezes, or rhonchi. Chest is nontender. Heart has regular rate and  rhythm without murmur. Abdomen is soft, flat, nontender without masses or hepatosplenomegaly and peristalsis is normoactive. Extremities have no cyanosis or edema, full range of motion is present. Skin is warm and dry without rash. Neurologic: Mental status is normal, cranial nerves are intact, there are no motor or sensory deficits.  ED Results / Procedures / Treatments   Labs (all labs ordered are listed, but only abnormal results are displayed) Labs Reviewed  BASIC METABOLIC PANEL - Abnormal; Notable for the following components:      Result Value   Sodium 134 (*)    CO2 20 (*)    Glucose, Bld 438 (*)    BUN 27 (*)    All other components within normal limits  CBC - Abnormal; Notable for the following components:   WBC 10.6 (*)    All other components within normal limits  URINALYSIS, ROUTINE W REFLEX MICROSCOPIC - Abnormal; Notable for the following components:   Color, Urine STRAW (*)    Glucose, UA >=500 (*)    Ketones, ur 20 (*)    Bacteria, UA RARE (*)    All other components within normal limits  BLOOD GAS, VENOUS - Abnormal; Notable for the following components:   pH, Ven 7.438 (*)    pCO2, Ven 27.5 (*)    pO2, Ven 188.0 (*)    Bicarbonate 18.3 (*)    Acid-base deficit 4.3 (*)    All other components within normal limits  CBG MONITORING, ED - Abnormal; Notable for the following components:   Glucose-Capillary 430 (*)    All other components within normal limits  CBG MONITORING, ED - Abnormal; Notable for the following components:   Glucose-Capillary 212 (*)    All other components within normal limits   Procedures Procedures  Medications Ordered in ED Medications  sodium chloride 0.9 % bolus 1,000 mL (0 mLs Intravenous  Stopped 01/01/20 0111)  insulin aspart (novoLOG) injection 5 Units (5 Units Intravenous Given 01/01/20 0007)    ED Course  I have reviewed the triage vital signs and the nursing notes.  Pertinent lab results that were available during my care  of the patient were reviewed by me and considered in my medical decision making (see chart for details).  Hyperglycemia. Labs confirm glucose 438. CO2 is borderline low at 20, but with normal anion gap of 11. BUN is elevated relative to creatinine suggesting dehydration. CBC is normal. Urinalysis is positive for ketones as well as glucose but no pyuria or bacteriuria. Will check venous blood gas, give IV fluids and IV insulin. Old records are reviewed, and she does have prior hospitalizations for urosepsis, but no episodes of ketoacidosis.  Venous blood gas shows normal pH, no evidence of ketoacidosis.  She feels better following IV fluids.  Glucose is come down to 212.  She is able to ambulate without dizziness and is felt to be safe for discharge.  Advised to monitor her glucose carefully as it may continue to trend high because of her steroid injection.  MDM Rules/Calculators/A&P  Final Clinical Impression(s) / ED Diagnoses Final diagnoses:  Hyperglycemia    Rx / DC Orders ED Discharge Orders    None       Delora Fuel, MD 73/53/29 0202

## 2019-12-31 NOTE — ED Triage Notes (Signed)
Pt sent from urgent care for elevated blood sugar. Per pt dizziness, blurry vision and blood sugar 420 at urgent care. Also seen for epidural today.

## 2019-12-31 NOTE — ED Notes (Signed)
CBG 430. RN aware.

## 2020-01-01 DIAGNOSIS — E1165 Type 2 diabetes mellitus with hyperglycemia: Secondary | ICD-10-CM | POA: Diagnosis not present

## 2020-01-01 LAB — BLOOD GAS, VENOUS
Acid-base deficit: 4.3 mmol/L — ABNORMAL HIGH (ref 0.0–2.0)
Bicarbonate: 18.3 mmol/L — ABNORMAL LOW (ref 20.0–28.0)
O2 Saturation: 99.8 %
Patient temperature: 98.6
pCO2, Ven: 27.5 mmHg — ABNORMAL LOW (ref 44.0–60.0)
pH, Ven: 7.438 — ABNORMAL HIGH (ref 7.250–7.430)
pO2, Ven: 188 mmHg — ABNORMAL HIGH (ref 32.0–45.0)

## 2020-01-01 LAB — CBG MONITORING, ED: Glucose-Capillary: 212 mg/dL — ABNORMAL HIGH (ref 70–99)

## 2020-01-01 NOTE — ED Notes (Signed)
Pt able to ambulate without assistance to restroom

## 2020-01-01 NOTE — Discharge Instructions (Addendum)
Please monitor your blood sugar carefully, it may tend to get high with your steroid injection.

## 2020-01-28 ENCOUNTER — Telehealth: Payer: Self-pay | Admitting: *Deleted

## 2020-01-28 ENCOUNTER — Other Ambulatory Visit: Payer: Self-pay | Admitting: Oncology

## 2020-01-28 ENCOUNTER — Telehealth: Payer: Self-pay | Admitting: Oncology

## 2020-01-28 DIAGNOSIS — Z9889 Other specified postprocedural states: Secondary | ICD-10-CM

## 2020-01-28 NOTE — Telephone Encounter (Signed)
VM left by pt stating " when is my appointment "  Noted no appointment- appointment request sent to scheduling with request to contact the patient.

## 2020-01-28 NOTE — Telephone Encounter (Signed)
Unable to reach pt. No voicemail set up. Scheduled appts on 5/20. Sent a message to RN Val to contact pt.   Scheduled per 5/13 sch msg.

## 2020-02-03 ENCOUNTER — Other Ambulatory Visit: Payer: Self-pay

## 2020-02-03 NOTE — Progress Notes (Signed)
[place]  [phone] [phone]     ID: [name] DOB: December 30, 1950  MR#: [id number]  NIO#:[id number]  Patient Care Team: Caren Macadam, MD as PCP - General (Family Medicine) Magrinat, Virgie Dad, MD as Consulting Physician (Oncology) Fontaine, Belinda Block, MD (Inactive) as Consulting Physician (Gynecology) Robley Fries, MD as Consulting Physician (Urology) Susa Day, MD as Consulting Physician (Orthopedic Surgery) Danis, Kirke Corin, MD as Consulting Physician (Gastroenterology) OTHER MD:  CHIEF COMPLAINT: Estrogen receptor positive breast cancer  CURRENT TREATMENT: Completing 5 years tamoxifen   INTERVAL HISTORY: Destiny Moody returns today for follow-up of her estrogen receptor positive breast cancer.   She is completing 5 years of tamoxifen. She reports hair thinning and tells me that her teeth are cracking.  She goes to [place] for dental care since it is so expensive here.  She has never had significant problems with hot flashes or vaginal wetness from the tamoxifen.  Since her last visit, she underwent bone density screening on 03/10/2020 showing a T-score of -2.5, which is considered osteoporotic.  She presented to the ED on 05/07/2019 with nausea, vomiting, and abdominal pain. She was found to have sepsis secondary to pyelonephritis, as well as E. Coli bacteremia. CT abdomen/pelvis performed that day showed: no acute abnormality or explanation for abdominal pain; focal soft tissue prominence involving posterior wall of rectum, similar in location to prior exam.  She later underwent outpatient colonoscopy on 06/29/2019 under Dr. Loletha Carrow. The results were unremarkable.   She presented to the ED again on 08/25/2019 with dysuria and worsening right flank pain. She was found to have a UTI. CT abdomen/pelvis performed that day showed findings consistent with right pyelonephritis.  She underwent pelvic ultrasound on 10/02/2019 to evaluate right lower  abdominal pain. This showed: small amount of nonspecific endometrial fluid and tiny cystic foci in endometrium; atrophic uterus; 7 mm left ovarian cyst.  She is scheduled for annual diagnostic mammogram on 02/18/2020 at [facility].   REVIEW OF SYSTEMS: Destiny Moody continues to work at [employer].  She is puzzled by her recurrent pyelonephritis but thankfully that has not recurred in the last several months.  She is benefiting from the vaginal exercises that she was taught and she has less urinary leakage at this point.  She has received both doses of the Pfizer vaccine which she tolerated well.  He tells me there is a "knot" on the outside portion of the right breast that she wanted me to look at today.  A detailed review of systems today was otherwise stable   BREAST CANCER HISTORY: From Dr. Ernestina Penna intake note dated 01/17/2015:  "She had abnormal screening mammogram in September 2015, underwent biopsy on 06/15/2014 which was negative for malignancy. She developed I small hematoma after biopsy, which required drainage afterwards. She finally had a bilateral breast MRI , which showed a 1.7 cm non-mass enhancement in the medial right breast. She had repeated mammogram on 08/19/2014 which showed again non-mass like enhancement in the right breast which was biopsied before.   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 She tolerates the biopsy well without any complications "  Her subsequent history is as detailed below.   PAST MEDICAL HISTORY: Past Medical History:  Diagnosis Date  . Allergy   . Arthritis   . Breast cancer ([place]) 08/24/14   right, upper inner  . Cancer ([place])   . Chronic kidney disease   .  Depression    no meds  . HSV (herpes simplex virus) anogenital infection 2018  . Hypercholesterolemia   . Hypertension   . Hypothyroidism   . Multinodular thyroid   . NIDDM (non-insulin dependent diabetes mellitus)   .  Osteoporosis 2020   T score -2.5 stable from prior DEXA  . Radiation 01/31/15-03/01/15   right  breast  . Rheumatoid arthritis(714.0)    lower back  . Wears contact lenses     PAST SURGICAL HISTORY: Past Surgical History:  Procedure Laterality Date  . BREAST LUMPECTOMY Right 2016  . BREAST SURGERY     Lumpectomy  . CESAREAN SECTION  [id number]   x 2  . CHOLECYSTECTOMY  1993  . COLONOSCOPY  last 06/15/2016   greater than 12 years but not sure where colonoscopy was performed  . INCISION AND DRAINAGE ABSCESS Right 11/24/2014   Procedure: INCISION AND DRAINAGE RIGHT AXILLA  ABSCESS;  Surgeon: Fanny Skates, MD;  Location: [facility];  Service: General;  Laterality: Right;  . RADIOACTIVE SEED GUIDED PARTIAL MASTECTOMY WITH AXILLARY SENTINEL LYMPH NODE BIOPSY Right 10/12/2014   Procedure: RIGHT  PARTIAL MASTECTOMY WITH RADIOACTIVE SEED LOCALIZATION  RIGHT  AXILLARY SENTINEL  NODE BIOPSY;  Surgeon: Fanny Skates, MD;  Location: [facility];  Service: General;  Laterality: Right;  . TUBAL LIGATION  1996   re annastomosis  . WISDOM TOOTH EXTRACTION      FAMILY HISTORY Family History  Problem Relation Age of Onset  . Diabetes Mother        niddm  . Heart disease Mother   . Heart failure Father   . Heart disease Father   . Ovarian cancer Cousin 64  . Hyperlipidemia Sister   . Colon cancer Maternal Uncle 39  . Esophageal cancer Neg Hx   . Rectal cancer Neg Hx   . Stomach cancer Neg Hx   . Colon polyps Neg Hx    the patient's father died from a myocardial infarction at age 18. The patient's mother died at age 51 from complications of diabetes. The patient had no brothers, one sister. There is no history of cancer in the immediate family but the patient has one cousin on the mother's side diagnosed with ovarian cancer before the age of 15. Also that cousin's father, the patient's mother's brother, was diagnosed with colon cancer at age 49   GYNECOLOGIC HISTORY:  No LMP recorded.  Patient is postmenopausal. Menarche age 85, first live birth age 25. She is GX P2. She went through the change of life approximately age 3. 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 Her husband [name] is in the rental business. The patient's daughter Lajean Manes works at [employer] in [place], and daughter Elray Mcgregor works at [employer]. The patient has 4 grandchildren. She attends our [employer]: In the absence of any documentation to the contrary, the patient's spouse is their HCPOA.    HEALTH MAINTENANCE: Social History   Tobacco Use  . Smoking status: Never Smoker  . Smokeless tobacco: Never Used  Substance Use Topics  . Alcohol use: Not Currently    Alcohol/week: 0.0 standard drinks  . Drug use: No     Colonoscopy: September 2017  PAP: 02/02/2019, normal  Bone density: Osteoporosis  Lipid panel:  Allergies  Allergen Reactions  . Shellfish Allergy Hives    unsure  Current Outpatient Medications  Medication Sig Dispense Refill  . acetaminophen (TYLENOL) 325 MG tablet Take 2 tablets (650 mg total) by mouth every 6 (six) hours as needed for mild pain (or Fever >/= 101).    Marland Kitchen amitriptyline (ELAVIL) 25 MG tablet TAKE ONE TABLET BY MOUTH AT BEDTIME  "OFFICE VISIT NEEDED" (Patient taking differently: Take 25 mg by mouth at bedtime as needed for sleep. ) 90 tablet 3  . aspirin 81 MG tablet Take 81 mg by mouth daily.    . Blood Glucose Monitoring Suppl ([place]) w/Device KIT     . Calcium Carbonate-Vitamin D 600-200 MG-UNIT TABS Take 1 tablet by mouth daily.     Marland Kitchen glimepiride (AMARYL) 2 MG tablet Take 2 mg by mouth daily before breakfast.    . Glucosamine 500 MG CAPS Take 1 capsule by mouth daily.    . hydrocortisone (ANUSOL-HC) 2.5 % rectal cream Place 1 application rectally 2 (two) times daily. Make sure cream is inside. (Patient taking  differently: Place 1 application rectally daily as needed for hemorrhoids. Make sure cream is inside.) 30 g 1  . liraglutide (VICTOZA) 18 MG/3ML SOPN Inject 1.2 mg into the skin daily.    Marland Kitchen lisinopril (ZESTRIL) 2.5 MG tablet Take 2.5 mg by mouth daily.    . metFORMIN (GLUCOPHAGE-XR) 500 MG 24 hr tablet TAKE TWO TABLETS BY MOUTH TWICE DAILY (Patient taking differently: Take 1,000 mg by mouth in the morning and at bedtime. ) 360 tablet 0  . Multiple Vitamin (MULTIVITAMIN) capsule Take 1 capsule by mouth daily.    . Omega-3 Fatty Acids (OMEGA-3 FISH OIL PO) Take 1 capsule by mouth daily.     Marland Kitchen oxybutynin (DITROPAN) 5 MG tablet Take 1 tablet (5 mg total) by mouth 2 (two) times daily. 60 tablet 0  . pioglitazone (ACTOS) 45 MG tablet Take 45 mg by mouth daily.    . risedronate (ACTONEL) 150 MG tablet Take 1 tablet (150 mg total) by mouth every 30 (thirty) days. with water on empty stomach, nothing by mouth or lie down for next 30 minutes. 1 tablet 12  . simvastatin (ZOCOR) 20 MG tablet Take 1 tablet (20 mg total) by mouth at bedtime. 90 tablet 3  . tamoxifen (NOLVADEX) 20 MG tablet Take 1 tablet (20 mg total) by mouth daily. 90 tablet 4  . tiZANidine (ZANAFLEX) 2 MG tablet Take 2 mg by mouth daily as needed for muscle spasms.     Marland Kitchen triamcinolone cream (KENALOG) 0.1 % Apply 1 application topically daily as needed (dry skin).     Marland Kitchen trimethoprim (TRIMPEX) 100 MG tablet Take 100 mg by mouth 2 (two) times daily.    . TURMERIC PO Take 1 capsule by mouth daily.     No current facility-administered medications for this visit.    OBJECTIVE: Latin woman in no acute distress  Vitals:   02/04/20 0908  BP: 102/83  Pulse: 88  Resp: 18  Temp: 98.3 F (36.8 C)  SpO2: 100%     Body mass index is 25.48 kg/m.    ECOG FS:1 - Symptomatic but completely ambulatory  Sclerae unicteric, EOMs intact Wearing a mask No cervical or supraclavicular adenopathy Lungs no rales or rhonchi Heart regular rate and rhythm  Abd soft, nontender, positive bowel sounds MSK no focal spinal tenderness, no upper extremity lymphedema Neuro: nonfocal, well oriented, appropriate affect Breasts: The right breast is status post lumpectomy and radiation.  In the lateral aspect there is a 1  cm nodule which is quite hard and movable, not associated with overlying erythema.  The right axilla is benign.  The left breast and left axilla are benign.   LAB RESULTS:  CMP     Component Value Date/Time   NA 134 (L) 12/31/2019 2122   NA 139 02/26/2017 0956   K 4.7 12/31/2019 2122   K 4.0 02/26/2017 0956   CL 103 12/31/2019 2122   CO2 20 (L) 12/31/2019 2122   CO2 25 02/26/2017 0956   GLUCOSE 438 (H) 12/31/2019 2122   GLUCOSE 252 (H) 02/26/2017 0956   BUN 27 (H) 12/31/2019 2122   BUN 19.7 02/26/2017 0956   CREATININE 0.82 12/31/2019 2122   CREATININE 0.80 02/26/2019 1133   CREATININE 0.8 02/26/2017 0956   CALCIUM 9.5 12/31/2019 2122   CALCIUM 9.4 02/26/2017 0956   PROT 8.0 08/25/2019 2110   PROT 7.0 02/26/2017 0956   ALBUMIN 3.8 08/25/2019 2110   ALBUMIN 3.7 02/26/2017 0956   AST 34 08/25/2019 2110   AST 19 02/26/2019 1133   AST 20 02/26/2017 0956   ALT 49 (H) 08/25/2019 2110   ALT 20 02/26/2019 1133   ALT 24 02/26/2017 0956   ALKPHOS 52 08/25/2019 2110   ALKPHOS 61 02/26/2017 0956   BILITOT 0.8 08/25/2019 2110   BILITOT 0.4 02/26/2019 1133   BILITOT 0.44 02/26/2017 0956   GFRNONAA >60 12/31/2019 2122   GFRNONAA >60 02/26/2019 1133   GFRNONAA 89 09/27/2015 2045   GFRAA >60 12/31/2019 2122   GFRAA >60 02/26/2019 1133   GFRAA >89 09/27/2015 2045    INo results found for: SPEP, UPEP  Lab Results  Component Value Date   WBC 7.1 02/04/2020   NEUTROABS 4.4 02/04/2020   HGB 12.6 02/04/2020   HCT 37.7 02/04/2020   MCV 95.7 02/04/2020   PLT 242 02/04/2020      Chemistry      Component Value Date/Time   NA 134 (L) 12/31/2019 2122   NA 139 02/26/2017 0956   K 4.7 12/31/2019 2122   K 4.0 02/26/2017 0956    CL 103 12/31/2019 2122   CO2 20 (L) 12/31/2019 2122   CO2 25 02/26/2017 0956   BUN 27 (H) 12/31/2019 2122   BUN 19.7 02/26/2017 0956   CREATININE 0.82 12/31/2019 2122   CREATININE 0.80 02/26/2019 1133   CREATININE 0.8 02/26/2017 0956      Component Value Date/Time   CALCIUM 9.5 12/31/2019 2122   CALCIUM 9.4 02/26/2017 0956   ALKPHOS 52 08/25/2019 2110   ALKPHOS 61 02/26/2017 0956   AST 34 08/25/2019 2110   AST 19 02/26/2019 1133   AST 20 02/26/2017 0956   ALT 49 (H) 08/25/2019 2110   ALT 20 02/26/2019 1133   ALT 24 02/26/2017 0956   BILITOT 0.8 08/25/2019 2110   BILITOT 0.4 02/26/2019 1133   BILITOT 0.44 02/26/2017 0956       No results found for: LABCA2  No components found for: LABCA125  No results for input(s): INR in the last 168 hours.  Urinalysis    Component Value Date/Time   COLORURINE STRAW (A) 12/31/2019 2122   APPEARANCEUR CLEAR 12/31/2019 2122   LABSPEC 1.025 12/31/2019 2122   PHURINE 5.0 12/31/2019 2122   GLUCOSEU >=500 (A) 12/31/2019 2122   HGBUR NEGATIVE 12/31/2019 2122   BILIRUBINUR NEGATIVE 12/31/2019 2122   BILIRUBINUR negative 09/27/2015 2103   KETONESUR 20 (A) 12/31/2019 2122   PROTEINUR NEGATIVE 12/31/2019 2122   UROBILINOGEN 0.2 09/27/2015 2103   UROBILINOGEN  0.2 04/19/2008 1937   NITRITE NEGATIVE 12/31/2019 2122   LEUKOCYTESUR NEGATIVE 12/31/2019 2122    STUDIES: No results found.   ASSESSMENT: 69 y.o. BRCA negative [place] woman status post right breast upper inner quadrant biopsy 08/24/2014 for a clinical T1c N0, stage IA invasive ductal carcinoma, grade 1 or 2, strongly estrogen and progesterone receptor positive, with an MIB-1 of 12%, and no HER-2 amplification  (1) status post right lumpectomy and sentinel lymph node sampling 10/12/2014 for a pT1c pN0, stage IA invasive ductal carcinoma, grade 1, with ample margins; repeat HER-2 again negative  (a) Oncotype DX score of 14 predicts a 10 year outside the breast recurrence rate  of 9% of the patient's only systemic therapy is tamoxifen for 5 years.  (2) Adjuvant radiation 01/31/2015-03/01/2015:  Right breast/ 42.72 Gy at 2.67 Gy per fraction x 21 fractions.  Right breast boost/ 10 Gy at 2 Gy per fraction x 5 fractions  (3) started tamoxifen 05/19/2015, completing 5 years July 2021  (4) genetic testing 04/26/2015 through the [employer] gene panel offered by [facility] found no deleterious mutations in ATM, BARD1, BRCA1, BRCA2, BRIP1, CDH1, CHEK2, EPCAM, MLH1, MRE11A, MSH2, MSH6, MUTYH, NBN, NF1, PALB2, PMS2, PTEN, RAD50, RAD51C, RAD51D, SMARCA4, STK11, and TP53.   (5) [facility] density at [facility] 12/20/2016 shows osteoporosis   PLAN: Destiny Moody is now 34 and half years out from definitive surgery for her breast cancer with no evidence of disease recurrence.  This is very favorable.  She will complete 5 years of tamoxifen in 44month.  In some cases we recommend continuing tamoxifen a total of 10 years but she had early disease and has a good prognosis.  I do not think she would significantly benefit from continuing tamoxifen and we are stopping it in July.  I reassured her that her dental problems are really not related to tamoxifen.  The hair thinning can be and if so hopefully she will get a little bit thicker growth over the next year.  I do not have an explanation for her recurrent pyelonephritis.  She has appropriate urologic follow-up.  I am more concerned about her diabetes.  She tells me her most recent A1c was 6.7.  She seems to be well informed and is following this closely  She would like to continue to be followed here on a yearly basis and I am comfortable with that.  She is going to see me July of next year after her June mammography in 2022  Total encounter time 25 minutes.*Chauncey Cruel MD   02/04/2020 9:36 AM [facility] [place] [address]Tel.  [phone]   Fax. [phone]  I, KWilburn Mylar am acting as scribe for Dr. GVirgie Dad Magrinat.  I, GLurline DelMD, have reviewed the above documentation for accuracy and completeness, and I agree with the above.   *Total Encounter Time as defined by the Centers for Medicare and Medicaid Services includes, in addition to the face-to-face time of a patient visit (documented in the note above) non-face-to-face time: obtaining and reviewing outside history, ordering and reviewing medications, tests or procedures, care coordination (communications with other health care professionals or caregivers) and documentation in the medical record.

## 2020-02-04 ENCOUNTER — Other Ambulatory Visit: Payer: Self-pay

## 2020-02-04 ENCOUNTER — Inpatient Hospital Stay: Payer: Medicare Other

## 2020-02-04 ENCOUNTER — Encounter: Payer: Medicare Other | Admitting: Obstetrics and Gynecology

## 2020-02-04 ENCOUNTER — Inpatient Hospital Stay: Payer: Medicare Other | Attending: Oncology | Admitting: Oncology

## 2020-02-04 VITALS — BP 102/83 | HR 88 | Temp 98.3°F | Resp 18 | Ht 62.0 in | Wt 139.3 lb

## 2020-02-04 DIAGNOSIS — Z170 Estrogen receptor positive status [ER+]: Secondary | ICD-10-CM | POA: Insufficient documentation

## 2020-02-04 DIAGNOSIS — Z79810 Long term (current) use of selective estrogen receptor modulators (SERMs): Secondary | ICD-10-CM | POA: Diagnosis not present

## 2020-02-04 DIAGNOSIS — C50211 Malignant neoplasm of upper-inner quadrant of right female breast: Secondary | ICD-10-CM | POA: Insufficient documentation

## 2020-02-04 DIAGNOSIS — I158 Other secondary hypertension: Secondary | ICD-10-CM | POA: Diagnosis not present

## 2020-02-04 DIAGNOSIS — M810 Age-related osteoporosis without current pathological fracture: Secondary | ICD-10-CM | POA: Insufficient documentation

## 2020-02-04 LAB — CBC WITH DIFFERENTIAL/PLATELET
Abs Immature Granulocytes: 0.02 10*3/uL (ref 0.00–0.07)
Basophils Absolute: 0 10*3/uL (ref 0.0–0.1)
Basophils Relative: 0 %
Eosinophils Absolute: 0.1 10*3/uL (ref 0.0–0.5)
Eosinophils Relative: 2 %
HCT: 37.7 % (ref 36.0–46.0)
Hemoglobin: 12.6 g/dL (ref 12.0–15.0)
Immature Granulocytes: 0 %
Lymphocytes Relative: 28 %
Lymphs Abs: 2 10*3/uL (ref 0.7–4.0)
MCH: 32 pg (ref 26.0–34.0)
MCHC: 33.4 g/dL (ref 30.0–36.0)
MCV: 95.7 fL (ref 80.0–100.0)
Monocytes Absolute: 0.5 10*3/uL (ref 0.1–1.0)
Monocytes Relative: 7 %
Neutro Abs: 4.4 10*3/uL (ref 1.7–7.7)
Neutrophils Relative %: 63 %
Platelets: 242 10*3/uL (ref 150–400)
RBC: 3.94 MIL/uL (ref 3.87–5.11)
RDW: 13.1 % (ref 11.5–15.5)
WBC: 7.1 10*3/uL (ref 4.0–10.5)
nRBC: 0 % (ref 0.0–0.2)

## 2020-02-04 LAB — COMPREHENSIVE METABOLIC PANEL
ALT: 18 U/L (ref 0–44)
AST: 20 U/L (ref 15–41)
Albumin: 3.7 g/dL (ref 3.5–5.0)
Alkaline Phosphatase: 58 U/L (ref 38–126)
Anion gap: 11 (ref 5–15)
BUN: 17 mg/dL (ref 8–23)
CO2: 22 mmol/L (ref 22–32)
Calcium: 9.1 mg/dL (ref 8.9–10.3)
Chloride: 107 mmol/L (ref 98–111)
Creatinine, Ser: 0.79 mg/dL (ref 0.44–1.00)
GFR calc Af Amer: >60 mL/min (ref >60)
GFR calc non Af Amer: >60 mL/min (ref >60)
Glucose, Bld: 159 mg/dL — ABNORMAL HIGH (ref 70–99)
Potassium: 4.3 mmol/L (ref 3.5–5.1)
Sodium: 140 mmol/L (ref 135–145)
Total Bilirubin: 0.5 mg/dL (ref 0.3–1.2)
Total Protein: 7.1 g/dL (ref 6.5–8.1)

## 2020-02-05 ENCOUNTER — Telehealth: Payer: Self-pay | Admitting: Oncology

## 2020-02-05 ENCOUNTER — Ambulatory Visit (INDEPENDENT_AMBULATORY_CARE_PROVIDER_SITE_OTHER): Payer: Medicare Other | Admitting: Obstetrics and Gynecology

## 2020-02-05 ENCOUNTER — Encounter: Payer: Self-pay | Admitting: Obstetrics and Gynecology

## 2020-02-05 VITALS — BP 116/74 | Ht 63.0 in | Wt 142.0 lb

## 2020-02-05 DIAGNOSIS — N952 Postmenopausal atrophic vaginitis: Secondary | ICD-10-CM

## 2020-02-05 DIAGNOSIS — Z01411 Encounter for gynecological examination (general) (routine) with abnormal findings: Secondary | ICD-10-CM

## 2020-02-05 DIAGNOSIS — N83202 Unspecified ovarian cyst, left side: Secondary | ICD-10-CM | POA: Diagnosis not present

## 2020-02-05 DIAGNOSIS — N898 Other specified noninflammatory disorders of vagina: Secondary | ICD-10-CM | POA: Diagnosis not present

## 2020-02-05 DIAGNOSIS — M810 Age-related osteoporosis without current pathological fracture: Secondary | ICD-10-CM

## 2020-02-05 DIAGNOSIS — N63 Unspecified lump in unspecified breast: Secondary | ICD-10-CM

## 2020-02-05 LAB — WET PREP FOR TRICH, YEAST, CLUE

## 2020-02-05 NOTE — Progress Notes (Signed)
Destiny Moody 08-25-1951 [id number]  SUBJECTIVE:  69 y.o. G2P2 female for annual routine gynecologic exam. Had a pelvic ultrasound 09/2019 for workup of RLQ pain that subsequently resolved.  She was found to have a small left ovarian cyst (<1 cm), and probable endometrial polyps. She did notice a lump on her left breast and saw Dr. Jana Hakim in Oncology and she is set up for a diagnostic mammogram.  She also notes a 'dark colored thick discharge' most days.  Some vaginal itching.  No odor.  No vaginal bleeding.  09/2019 ultrasound showed endometrial thickness of 3 mm.  She is taking tamoxifen for a few more months.     Current Outpatient Medications  Medication Sig Dispense Refill  . acetaminophen (TYLENOL) 325 MG tablet Take 2 tablets (650 mg total) by mouth every 6 (six) hours as needed for mild pain (or Fever >/= 101).    Marland Kitchen amitriptyline (ELAVIL) 25 MG tablet TAKE ONE TABLET BY MOUTH AT BEDTIME  "OFFICE VISIT NEEDED" (Patient taking differently: Take 25 mg by mouth at bedtime as needed for sleep. ) 90 tablet 3  . aspirin 81 MG tablet Take 81 mg by mouth daily.    . Blood Glucose Monitoring Suppl ([place]) w/Device KIT     . Calcium Carbonate-Vitamin D 600-200 MG-UNIT TABS Take 1 tablet by mouth daily.     Marland Kitchen glimepiride (AMARYL) 2 MG tablet Take 2 mg by mouth daily before breakfast.    . Glucosamine 500 MG CAPS Take 1 capsule by mouth daily.    . hydrocortisone (ANUSOL-HC) 2.5 % rectal cream Place 1 application rectally 2 (two) times daily. Make sure cream is inside. (Patient taking differently: Place 1 application rectally daily as needed for hemorrhoids. Make sure cream is inside.) 30 g 1  . liraglutide (VICTOZA) 18 MG/3ML SOPN Inject 1.2 mg into the skin daily.    Marland Kitchen lisinopril (ZESTRIL) 2.5 MG tablet Take 2.5 mg by mouth daily.    . metFORMIN (GLUCOPHAGE-XR) 500 MG 24 hr tablet TAKE TWO TABLETS BY MOUTH TWICE DAILY (Patient taking differently: Take 1,000 mg by mouth in the  morning and at bedtime. ) 360 tablet 0  . Multiple Vitamin (MULTIVITAMIN) capsule Take 1 capsule by mouth daily.    . Omega-3 Fatty Acids (OMEGA-3 FISH OIL PO) Take 1 capsule by mouth daily.     Marland Kitchen oxybutynin (DITROPAN) 5 MG tablet Take 1 tablet (5 mg total) by mouth 2 (two) times daily. 60 tablet 0  . pioglitazone (ACTOS) 45 MG tablet Take 45 mg by mouth daily.    . risedronate (ACTONEL) 150 MG tablet Take 1 tablet (150 mg total) by mouth every 30 (thirty) days. with water on empty stomach, nothing by mouth or lie down for next 30 minutes. 1 tablet 12  . simvastatin (ZOCOR) 20 MG tablet Take 1 tablet (20 mg total) by mouth at bedtime. 90 tablet 3  . tamoxifen (NOLVADEX) 20 MG tablet Take 1 tablet (20 mg total) by mouth daily. 90 tablet 4  . tiZANidine (ZANAFLEX) 2 MG tablet Take 2 mg by mouth daily as needed for muscle spasms.     Marland Kitchen triamcinolone cream (KENALOG) 0.1 % Apply 1 application topically daily as needed (dry skin).     Marland Kitchen trimethoprim (TRIMPEX) 100 MG tablet Take 100 mg by mouth 2 (two) times daily.    . TURMERIC PO Take 1 capsule by mouth daily.     No current facility-administered medications for this visit.  Allergies: Shellfish allergy  No LMP recorded. Patient is postmenopausal.  Past medical history,surgical history, problem list, medications, allergies, family history and social history were all reviewed and documented as reviewed in the EPIC chart.  ROS:  Feeling well. No dyspnea or chest pain on exertion.  No abdominal pain, change in bowel habits, black or bloody stools.  No urinary tract symptoms. GYN ROS: no abnormal bleeding, no pelvic pain, + dark colored discharge, +breast pain + new left breast lump on self exam. No neurological complaints.    OBJECTIVE:  BP 116/74   Ht 5' 3" (1.6 m)   Wt 142 lb (64.4 kg)   BMI 25.15 kg/m  The patient appears well, alert, oriented x 3, in no distress. ENT normal.  Neck supple. No cervical or supraclavicular adenopathy or  thyromegaly.  Lungs are clear, good air entry, no wheezes, rhonchi or rales. S1 and S2 normal, no murmurs, regular rate and rhythm.  Abdomen soft without tenderness, guarding, mass or organomegaly.  Neurological is normal, no focal findings.  BREAST EXAM: right breast with 1 cm in upper outer quadrant toward axilla, firm and somewhat mobile, no other masses, no skin or nipple changes or axillary nodes, left breast normal without mass, skin or nipple changes or axillary nodes  PELVIC EXAM: VULVA: normal appearing vulva with no masses, tenderness or lesions, VAGINA: normal appearing vagina with normal color and scant whitish discharge, no lesions, no bleeding or 'dark' colored discharge present, CERVIX: normal appearing cervix without discharge or lesions, UTERUS: uterus is normal size, shape, consistency and nontender, ADNEXA: normal adnexa in size, nontender and no masses, RECTAL: normal rectal, no masses, WET MOUNT done - results: negative for pathogens, normal epithelial cells  Chaperone: Kim Gardner present during the examination  ASSESSMENT:  69 y.o. G2P2 here for annual gynecologic exam  PLAN:   1. Postmenopausal. Vaginal dryness and reported discharge likely due to atrophy.  Not a candidate for vaginal estrogens with history of breast cancer.  Vaginal wet mount negative.  Recommend that she try Replens vaginal moisturizer, contact us if symptoms do not improve. 2. Pap smear 01/2019.  No significant history of abnormal Pap smears.  Next Pap smear due 2023 following the current guidelines recommending the 3 year interval. 3.  Small left ovarian cyst.  I encouraged her to schedule the recommended 6-month interval follow-up pelvic ultrasound to ensure the cyst is stable, we will also get another look at her endometrium given the "dark" discharge that she has had recently.  Her last ultrasound earlier this year indicated a normal endometrial thickness 3 mm, the ovarian cyst was only 7 mm at that  time. 4. History ER/PR positive right breast cancer.  New breast mass noted on right as above.  She has seen oncology already for this.   Diagnostic mammogram on 02/18/2020. Continues on tamoxifen.  Follows with Dr. Magrinat.   5. Colonoscopy 2017.  Recommended that she follow up at the recommended interval.  6. Osteoporosis.  DEXA 02/2019.  T score -2.5, BMD stable.  Previously on Boniva and then switched to Actonel which she will continue for now.  Next DEXA recommended 2022. 7. Health maintenance.  No labs today as she normally has these completed elsewhere.  Return annually or sooner, prn.  Jeffrey Kendall MD 02/05/20  

## 2020-02-05 NOTE — Telephone Encounter (Signed)
Call pt to schedule July 2022 appts per 5/20 los. Pt stated that she did not want to schedule an appt that far out and that she would call back to schedule an appt.

## 2020-02-22 ENCOUNTER — Other Ambulatory Visit: Payer: Self-pay

## 2020-02-23 ENCOUNTER — Ambulatory Visit (INDEPENDENT_AMBULATORY_CARE_PROVIDER_SITE_OTHER): Payer: Medicare Other | Admitting: Obstetrics and Gynecology

## 2020-02-23 ENCOUNTER — Encounter: Payer: Self-pay | Admitting: Obstetrics and Gynecology

## 2020-02-23 ENCOUNTER — Ambulatory Visit (INDEPENDENT_AMBULATORY_CARE_PROVIDER_SITE_OTHER): Payer: Medicare Other

## 2020-02-23 VITALS — BP 120/74

## 2020-02-23 DIAGNOSIS — N8302 Follicular cyst of left ovary: Secondary | ICD-10-CM | POA: Diagnosis not present

## 2020-02-23 DIAGNOSIS — N854 Malposition of uterus: Secondary | ICD-10-CM

## 2020-02-23 DIAGNOSIS — N83202 Unspecified ovarian cyst, left side: Secondary | ICD-10-CM

## 2020-02-23 NOTE — Progress Notes (Signed)
   Destiny Moody 10-Oct-1950 [id number]  SUBJECTIVE:  69 y.o. G2P2 female presents for follow-up pelvic ultrasound due to postmenopausal bleeding and to follow-up a small left ovarian cyst seen on previous imaging.  Does not have any further vaginal bleeding just increased discharge, she is stopping tamoxifen next week.  Allergies: Shellfish allergy  No LMP recorded. Patient is postmenopausal.  Past medical history,surgical history, problem list, medications, allergies, family history and social history were all reviewed and documented as reviewed in the EPIC chart.  OBJECTIVE:  BP 120/74   Pelvic ultrasound Small retroverted uterus 6.2 x 3.8 x 2.2 cm, peripheral vascular calcifications noted. Small amount of endometrial fluid in endometrial cavity.  Cystic appearance of endometrium.   Endometrium measures 2.2 mm. Bilateral ovaries are small with atrophic appearance.  Left ovary with 2 simple follicles measuring 6.6 mm and 3.7 mm.  Stable from previous exam. No adnexal masses.  No free fluid.  ASSESSMENT:  69 y.o. G2P2 here for pelvic ultrasound follow-up  PLAN:  She is reassured by the stable tiny left ovarian cysts which do not require follow-up at this point giving the exceedingly small size and lack of symptoms. She is encouraged to seek evaluation if she develops any vaginal bleeding, but it is reassuring the endometrium is thin on today's examination.   Joseph Pierini MD 02/23/20

## 2020-02-24 ENCOUNTER — Ambulatory Visit
Admission: RE | Admit: 2020-02-24 | Discharge: 2020-02-24 | Disposition: A | Discharge status: Home / Self Care | Payer: Medicare Other | Source: Ambulatory Visit | Attending: Oncology | Admitting: Oncology

## 2020-02-24 ENCOUNTER — Other Ambulatory Visit: Payer: Self-pay

## 2020-02-24 DIAGNOSIS — Z9889 Other specified postprocedural states: Secondary | ICD-10-CM

## 2020-02-24 DIAGNOSIS — I158 Other secondary hypertension: Secondary | ICD-10-CM

## 2020-02-24 DIAGNOSIS — Z170 Estrogen receptor positive status [ER+]: Secondary | ICD-10-CM

## 2020-02-25 ENCOUNTER — Other Ambulatory Visit: Payer: Self-pay | Admitting: Oncology

## 2020-02-25 DIAGNOSIS — C50211 Malignant neoplasm of upper-inner quadrant of right female breast: Secondary | ICD-10-CM

## 2020-02-25 NOTE — Progress Notes (Signed)
I called the patient and let her know that I put in an order for breast MRI to evaluate the right breast tissue that is benign concerning her.  I also alerted her that this may be extremely expensive and she needs to make sure her insurance will be paying for this

## 2020-04-15 ENCOUNTER — Other Ambulatory Visit: Payer: Self-pay

## 2020-04-15 DIAGNOSIS — I739 Peripheral vascular disease, unspecified: Secondary | ICD-10-CM

## 2020-04-18 ENCOUNTER — Other Ambulatory Visit: Payer: Medicare Other

## 2020-04-29 ENCOUNTER — Encounter: Payer: Medicare Other | Admitting: Vascular Surgery

## 2020-04-29 ENCOUNTER — Encounter (HOSPITAL_COMMUNITY): Payer: Medicare Other

## 2020-07-06 ENCOUNTER — Telehealth: Payer: Self-pay

## 2020-07-06 NOTE — Telephone Encounter (Signed)
Pt called stating she needs to speak with a nurse, but left no details. This LPN attempted to return pt's call. No answer, LVM for pt to return my call.

## 2020-07-08 ENCOUNTER — Other Ambulatory Visit: Payer: Self-pay

## 2020-07-08 MED ORDER — RISEDRONATE SODIUM 150 MG PO TABS: 150.0000 mg | ORAL_TABLET | ORAL | 2 refills | Status: DC

## 2020-07-13 ENCOUNTER — Telehealth: Payer: Self-pay | Admitting: *Deleted

## 2020-07-13 NOTE — Telephone Encounter (Signed)
Destiny Moody states she had to cancel appts in August because she had to be out of town. She is back now and states the spot is growing under her left arm, very painful. Please advise

## 2020-07-13 NOTE — Telephone Encounter (Signed)
Dr Jana Hakim would like her to come see Sandi Mealy, PA to evaluate.   Will come Thursday at 1:00pm

## 2020-07-14 ENCOUNTER — Telehealth: Payer: Self-pay | Admitting: Medical

## 2020-07-14 ENCOUNTER — Inpatient Hospital Stay: Payer: Medicare Other | Attending: Medical | Admitting: Medical

## 2020-07-14 ENCOUNTER — Other Ambulatory Visit: Payer: Self-pay

## 2020-07-14 VITALS — BP 133/87 | HR 86 | Temp 98.4°F | Resp 18 | Ht 63.0 in | Wt 143.8 lb

## 2020-07-14 DIAGNOSIS — C50211 Malignant neoplasm of upper-inner quadrant of right female breast: Secondary | ICD-10-CM

## 2020-07-14 DIAGNOSIS — E039 Hypothyroidism, unspecified: Secondary | ICD-10-CM | POA: Diagnosis not present

## 2020-07-14 DIAGNOSIS — F32A Depression, unspecified: Secondary | ICD-10-CM | POA: Insufficient documentation

## 2020-07-14 DIAGNOSIS — E1122 Type 2 diabetes mellitus with diabetic chronic kidney disease: Secondary | ICD-10-CM | POA: Insufficient documentation

## 2020-07-14 DIAGNOSIS — M069 Rheumatoid arthritis, unspecified: Secondary | ICD-10-CM | POA: Diagnosis not present

## 2020-07-14 DIAGNOSIS — R222 Localized swelling, mass and lump, trunk: Secondary | ICD-10-CM

## 2020-07-14 DIAGNOSIS — N189 Chronic kidney disease, unspecified: Secondary | ICD-10-CM | POA: Insufficient documentation

## 2020-07-14 DIAGNOSIS — Z79899 Other long term (current) drug therapy: Secondary | ICD-10-CM | POA: Insufficient documentation

## 2020-07-14 DIAGNOSIS — B009 Herpesviral infection, unspecified: Secondary | ICD-10-CM | POA: Diagnosis not present

## 2020-07-14 DIAGNOSIS — E7800 Pure hypercholesterolemia, unspecified: Secondary | ICD-10-CM | POA: Insufficient documentation

## 2020-07-14 DIAGNOSIS — I129 Hypertensive chronic kidney disease with stage 1 through stage 4 chronic kidney disease, or unspecified chronic kidney disease: Secondary | ICD-10-CM | POA: Diagnosis not present

## 2020-07-14 DIAGNOSIS — Z923 Personal history of irradiation: Secondary | ICD-10-CM | POA: Diagnosis not present

## 2020-07-14 DIAGNOSIS — M810 Age-related osteoporosis without current pathological fracture: Secondary | ICD-10-CM | POA: Insufficient documentation

## 2020-07-14 DIAGNOSIS — Z9011 Acquired absence of right breast and nipple: Secondary | ICD-10-CM | POA: Insufficient documentation

## 2020-07-14 DIAGNOSIS — Z170 Estrogen receptor positive status [ER+]: Secondary | ICD-10-CM | POA: Diagnosis not present

## 2020-07-14 MED ORDER — PREDNISONE 50 MG PO TABS: ORAL_TABLET | ORAL | 0 refills | Status: DC

## 2020-07-14 NOTE — Telephone Encounter (Signed)
Scheduled per 10/28 los. Printed avs and calendar for pt.  

## 2020-07-14 NOTE — Patient Instructions (Signed)

## 2020-07-14 NOTE — Progress Notes (Signed)
[facility] Progress Note   Destiny Moody [id number] May 27, 1951 69 y.o.  Destiny Moody is managed by Dr. Lurline Del  Actively treated with chemotherapy/immunotherapy/hormonal therapy: no  Next scheduled appointment with provider: 07/29/2020  Assessment: Plan:    Mass of chest wall, right - Plan: CT CHEST W CONTRAST, MM RT BREAST BX W LOC DEV 1ST LESION IMAGE BX SPEC STEREO GUIDE, predniSONE (DELTASONE) 50 MG tablet  Malignant neoplasm of upper-inner quadrant of right breast in female, estrogen receptor positive ([place]) - Plan: CT CHEST W CONTRAST, MM RT BREAST BX W LOC DEV 1ST LESION IMAGE BX SPEC STEREO GUIDE, predniSONE (DELTASONE) 50 MG tablet   Painful right lateral chest wall mass in a setting of a history of an ER positive malignant neoplasm of the right breast: Ms. Destiny Moody has been referred for a CT scan of the chest and for a biopsy of a left chest wall mass.  A prescription for prednisone was sent for her to take prior to her CT scan given her shellfish allergy.  Additionally, she will be seen by Dr. Jana Hakim in follow-up on 07/29/2020.  Please see After Visit Summary for patient specific instructions.  Future Appointments  Date Time Provider [place]  07/29/2020 10:30 AM [facility] None  07/29/2020 11:00 AM Magrinat, Virgie Dad, MD [facility] None  08/16/2020  2:00 PM [facility] [place] [facility]  08/16/2020  2:40 PM Cherre Robins, MD [facility]    Orders Placed This Encounter  Procedures  . CT CHEST W CONTRAST  . MM RT BREAST BX W LOC DEV 1ST LESION IMAGE BX SPEC STEREO GUIDE       Subjective:   Patient ID:  Destiny Moody is a 69 y.o. (DOB [birth year]) female.  Chief Complaint:  Chief Complaint  Patient presents with  . Nodule/Cyst    HPI Destiny Moody  is a 69 y.o. female with a diagnosis of an ER positive malignant neoplasm of the right breast.  She was last seen by Dr. Jana Hakim on 02/04/2020.  She had  had a mammogram completed on 02/23/2020.  She had reported that she felt a mass in her right lateral chest wall.  Her mammogram returned with the following report and recommendations.  Stable post lumpectomy and postradiation changes on the right. No interval findings suspicious for malignancy in either breast. No mammographically visible mass at the location of the mass felt by the patient, marked with a metallic marker.  Mammographic images were processed with CAD.  On physical exam, there is an approximately 2 x 1.5 cm oval, hard, smoothly marginated, mass-like area at the far lateral aspect of the right breast in the 10 o'clock position, 8 cm from the nipple. This is mildly tender to palpation today.  Targeted ultrasound is performed, showing normal appearing breast tissue throughout the outer right breast at the location of palpable concern. This includes mild fibroglandular tissue. No sonographically visible mass or other findings suspicious for malignancy.  IMPRESSION: No mammographic or sonographic evidence of malignancy in either breast.  RECOMMENDATION: 1. Consider MRI of the breasts for the palpable abnormality on the right to exclude a mammographically and sonographically occult malignancy or underlying rib metastasis. 2. Bilateral screening mammogram in 1 year.  Dr. Jana Hakim ordered an MRI of her breast for evaluation of this area.  She did not have a mammogram completed as ordered since she was [place] of the [place].  She was on tamoxifen from 05/19/2015 through June 2021.  She presents to the clinic today with pain in her right lateral chest wall at the site of a mass.  She reports that she is now having pain radiating down her right arm.  She has not had trauma, changes in activity, or unexplained weight loss.  Medications: I have reviewed the patient's current medications.  Allergies:  Allergies  Allergen Reactions  . Shellfish Allergy Hives    unsure     Past Medical History:  Diagnosis Date  . Allergy   . Arthritis   . Breast cancer ([place]) 08/24/14   right, upper inner  . Cancer ([place])   . Chronic kidney disease   . Depression    no meds  . HSV (herpes simplex virus) anogenital infection 2018  . Hypercholesterolemia   . Hypertension   . Hypothyroidism   . Multinodular thyroid   . NIDDM (non-insulin dependent diabetes mellitus)   . Osteoporosis 2020   T score -2.5 stable from prior DEXA  . Radiation 01/31/15-03/01/15   right  breast  . Rheumatoid arthritis(714.0)    lower back  . Wears contact lenses     Past Surgical History:  Procedure Laterality Date  . BREAST LUMPECTOMY Right 2016  . BREAST SURGERY     Lumpectomy  . CESAREAN SECTION  7939,0300   x 2  . CHOLECYSTECTOMY  1993  . COLONOSCOPY  last 06/15/2016   greater than 12 years but not sure where colonoscopy was performed  . INCISION AND DRAINAGE ABSCESS Right 11/24/2014   Procedure: INCISION AND DRAINAGE RIGHT AXILLA  ABSCESS;  Surgeon: Fanny Skates, MD;  Location: [facility];  Service: General;  Laterality: Right;  . RADIOACTIVE SEED GUIDED PARTIAL MASTECTOMY WITH AXILLARY SENTINEL LYMPH NODE BIOPSY Right 10/12/2014   Procedure: RIGHT  PARTIAL MASTECTOMY WITH RADIOACTIVE SEED LOCALIZATION  RIGHT  AXILLARY SENTINEL  NODE BIOPSY;  Surgeon: Fanny Skates, MD;  Location: [facility];  Service: General;  Laterality: Right;  . TUBAL LIGATION  1996   re annastomosis  . WISDOM TOOTH EXTRACTION      Family History  Problem Relation Age of Onset  . Diabetes Mother        niddm  . Heart disease Mother   . Heart failure Father   . Heart disease Father   . Ovarian cancer Cousin 6  . Hyperlipidemia Sister   . Colon cancer Maternal Uncle 38  . Esophageal cancer Neg Hx   . Rectal cancer Neg Hx   . Stomach cancer Neg Hx   . Colon polyps Neg Hx     Social History   Socioeconomic History  . Marital status: Married    Spouse name: Arnoldo  . Number of  children: 2  . Years of education: College  . Highest education level: Not on file  Occupational History    Comment: Self Employed  Tobacco Use  . Smoking status: Never Smoker  . Smokeless tobacco: Never Used  Vaping Use  . Vaping Use: Never used  Substance and Sexual Activity  . Alcohol use: Not Currently    Alcohol/week: 0.0 standard drinks  . Drug use: No  . Sexual activity: Yes    Birth control/protection: Post-menopausal    Comment: 1st intercourse 65 yo-5 partners  Other Topics Concern  . Not on file  Social History Narrative   Patient lives at home with her spouse.   Caffeine use: 1 cup daily   Social Determinants of Health   Financial Resource Strain:   . Difficulty of  Paying Living Expenses: Not on file  Food Insecurity:   . Worried About Charity fundraiser in the Last Year: Not on file  . Ran Out of Food in the Last Year: Not on file  Transportation Needs:   . Lack of Transportation (Medical): Not on file  . Lack of Transportation (Non-Medical): Not on file  Physical Activity:   . Days of Exercise per Week: Not on file  . Minutes of Exercise per Session: Not on file  Stress:   . Feeling of Stress : Not on file  Social Connections:   . Frequency of Communication with Friends and Family: Not on file  . Frequency of Social Gatherings with Friends and Family: Not on file  . Attends Religious Services: Not on file  . Active Member of Clubs or Organizations: Not on file  . Attends Archivist Meetings: Not on file  . Marital Status: Not on file  Intimate Partner Violence:   . Fear of Current or Ex-Partner: Not on file  . Emotionally Abused: Not on file  . Physically Abused: Not on file  . Sexually Abused: Not on file    Past Medical History, Surgical history, Social history, and Family history were reviewed and updated as appropriate.   Please see review of systems for further details on the patient's review from today.   Review of Systems:    Review of Systems  Constitutional: Negative for chills, diaphoresis and fever.  HENT: Negative for trouble swallowing and voice change.   Respiratory: Negative for cough, chest tightness, shortness of breath and wheezing.   Cardiovascular: Negative for chest pain and palpitations.  Gastrointestinal: Negative for abdominal pain, constipation, diarrhea, nausea and vomiting.  Musculoskeletal: Negative for back pain and myalgias.       Right lateral chest wall mass and pain.  Radiation of pain down the right arm.  Neurological: Negative for dizziness, light-headedness and headaches.    Objective:   Physical Exam:  BP 133/87 (BP Location: Left Arm, Patient Position: Sitting)   Pulse 86   Temp 98.4 F (36.9 C) (Tympanic)   Resp 18   Ht 5\' 3"  (1.6 m)   Wt 143 lb 12.8 oz (65.2 kg)   SpO2 100%   BMI 25.47 kg/m  ECOG: 0  Physical Exam Constitutional:      General: She is not in acute distress.    Appearance: Normal appearance. She is not ill-appearing.  HENT:     Head: Normocephalic and atraumatic.  Cardiovascular:     Rate and Rhythm: Normal rate and regular rhythm.     Heart sounds: No murmur heard.  No friction rub. No gallop.   Pulmonary:     Effort: Pulmonary effort is normal. No respiratory distress.     Breath sounds: Normal breath sounds. No wheezing, rhonchi or rales.  Neurological:     Mental Status: She is alert.    Breasts: The right breast is present with nipple everted.  There is no discharge.  There is a well-healed surgical incision in the superior right breast.  There is a hard 2.5 x 2.5 cm tender mass in the right lateral chest wall.  Lab Review:     Component Value Date/Time   NA 140 02/04/2020 0858   NA 139 02/26/2017 0956   K 4.3 02/04/2020 0858   K 4.0 02/26/2017 0956   CL 107 02/04/2020 0858   CO2 22 02/04/2020 0858   CO2 25 02/26/2017 0956   GLUCOSE 159 (H)  02/04/2020 0858   GLUCOSE 252 (H) 02/26/2017 0956   BUN 17 02/04/2020 0858   BUN 19.7  02/26/2017 0956   CREATININE 0.79 02/04/2020 0858   CREATININE 0.80 02/26/2019 1133   CREATININE 0.8 02/26/2017 0956   CALCIUM 9.1 02/04/2020 0858   CALCIUM 9.4 02/26/2017 0956   PROT 7.1 02/04/2020 0858   PROT 7.0 02/26/2017 0956   ALBUMIN 3.7 02/04/2020 0858   ALBUMIN 3.7 02/26/2017 0956   AST 20 02/04/2020 0858   AST 19 02/26/2019 1133   AST 20 02/26/2017 0956   ALT 18 02/04/2020 0858   ALT 20 02/26/2019 1133   ALT 24 02/26/2017 0956   ALKPHOS 58 02/04/2020 0858   ALKPHOS 61 02/26/2017 0956   BILITOT 0.5 02/04/2020 0858   BILITOT 0.4 02/26/2019 1133   BILITOT 0.44 02/26/2017 0956   GFRNONAA >60 02/04/2020 0858   GFRNONAA >60 02/26/2019 1133   GFRNONAA 89 09/27/2015 2045   GFRAA >60 02/04/2020 0858   GFRAA >60 02/26/2019 1133   GFRAA >89 09/27/2015 2045       Component Value Date/Time   WBC 7.1 02/04/2020 0858   RBC 3.94 02/04/2020 0858   HGB 12.6 02/04/2020 0858   HGB 12.5 02/26/2019 1133   HGB 13.3 02/26/2017 0956   HCT 37.7 02/04/2020 0858   HCT 38.9 02/26/2017 0956   PLT 242 02/04/2020 0858   PLT 228 02/26/2019 1133   PLT 214 02/26/2017 0956   MCV 95.7 02/04/2020 0858   MCV 93.4 02/26/2017 0956   MCH 32.0 02/04/2020 0858   MCHC 33.4 02/04/2020 0858   RDW 13.1 02/04/2020 0858   RDW 13.0 02/26/2017 0956   LYMPHSABS 2.0 02/04/2020 0858   LYMPHSABS 2.1 02/26/2017 0956   MONOABS 0.5 02/04/2020 0858   MONOABS 0.5 02/26/2017 0956   EOSABS 0.1 02/04/2020 0858   EOSABS 0.1 02/26/2017 0956   BASOSABS 0.0 02/04/2020 0858   BASOSABS 0.0 02/26/2017 0956   -------------------------------  Imaging from last 24 hours (if applicable):  Radiology interpretation: No results found.      This case was discussed with Dr. Jana Hakim. He expressed his agreement with my management of this patient.

## 2020-07-14 NOTE — Progress Notes (Signed)
Pt seen by PA Van only, no assessment by [facility] RN at this time (time constraints).  PA aware. 

## 2020-07-15 ENCOUNTER — Other Ambulatory Visit: Payer: Self-pay | Admitting: Family Medicine

## 2020-07-15 DIAGNOSIS — N63 Unspecified lump in unspecified breast: Secondary | ICD-10-CM

## 2020-07-15 DIAGNOSIS — Z853 Personal history of malignant neoplasm of breast: Secondary | ICD-10-CM

## 2020-07-19 ENCOUNTER — Other Ambulatory Visit: Payer: Self-pay

## 2020-07-19 ENCOUNTER — Ambulatory Visit
Admission: RE | Admit: 2020-07-19 | Discharge: 2020-07-19 | Disposition: A | Discharge status: Home / Self Care | Payer: Medicare Other | Source: Ambulatory Visit | Attending: Family Medicine | Admitting: Family Medicine

## 2020-07-19 DIAGNOSIS — Z853 Personal history of malignant neoplasm of breast: Secondary | ICD-10-CM

## 2020-07-19 DIAGNOSIS — N63 Unspecified lump in unspecified breast: Secondary | ICD-10-CM

## 2020-07-19 MED ORDER — GADOBUTROL 1 MMOL/ML IV SOLN
6.0000 mL | Freq: Once | INTRAVENOUS | Status: AC | PRN
  Administered 2020-07-19: 6 mL via INTRAVENOUS

## 2020-07-28 NOTE — Progress Notes (Signed)
[place]  [phone] Fax:(336) [phone]     ID: [name] DOB: 30-Mar-1951  MR#: [id number]  OVF#:[id number]  Patient Care Team: Caren Macadam, MD as PCP - General (Family Medicine) Jannice Beitzel, Virgie Dad, MD as Consulting Physician (Oncology) Fontaine, Belinda Block, MD (Inactive) as Consulting Physician (Gynecology) Robley Fries, MD as Consulting Physician (Urology) Susa Day, MD as Consulting Physician (Orthopedic Surgery) Danis, Kirke Corin, MD as Consulting Physician (Gastroenterology) OTHER MD:  CHIEF COMPLAINT: Estrogen receptor positive breast cancer  CURRENT TREATMENT: Observation   INTERVAL HISTORY: Destiny Moody returns today for follow-up of her estrogen receptor positive breast cancer.   Since her last visit, she presented for her annual mammogram with a palpable firm area in her right breast that she felt has enlarged. She proceeded to bilateral diagnostic mammography with tomography and right breast ultrasonography at [facility] on 02/24/2020 showing: breast density category B; no evidence of malignancy in either breast.   She completed 5 years of tamoxifen in 03/2020.  After stopping the medication she has had no further problems with vaginal discharge and her hair is starting to grow back a little bit thicker  She was evaluated here by our symptom management clinic 07/14/2020 for further evaluation of the painful mass in the right chest wall.  She was referred for a CT scan of the chest which has not yet been scheduled.  She was also referred for an MRI of the breast and MRI biopsy.  However breast MRI on 07/19/2020 to further evaluate the palpable area felt previously showed: breast composition B; no evidence of malignancy in either breast; no findings to explain patient's palpable abnormality.  She also underwent pelvic ultrasound on 02/23/2020, which was stable from previous exam.   REVIEW OF SYSTEMS: Destiny Moody recently traveled to [place]  where they visited the [place], spend some days at [employer], and where they also have some property.  They had to do quite a bit of work at the property apparently.  Aside from this a detailed review of systems today was stable   COVID 19 VACCINATION STATUS: fully vaccinated [employer]) with the booster November 2021   BREAST CANCER HISTORY: From Dr. Ernestina Penna intake note dated 01/17/2015:  "She had abnormal screening mammogram in September 2015, underwent biopsy on 06/15/2014 which was negative for malignancy. She developed I small hematoma after biopsy, which required drainage afterwards. She finally had a bilateral breast MRI , which showed a 1.7 cm non-mass enhancement in the medial right breast. She had repeated mammogram on 08/19/2014 which showed again non-mass like enhancement in the right breast which was biopsied before.   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 She tolerates the biopsy well without any complications "  Her subsequent history is as detailed below.   PAST MEDICAL HISTORY: Past Medical History:  Diagnosis Date  . Allergy   . Arthritis   . Breast cancer ([place]) 08/24/14   right, upper inner  . Cancer ([place])   . Chronic kidney disease   . Depression    no meds  . HSV (herpes simplex virus) anogenital infection 2018  . Hypercholesterolemia   . Hypertension   . Hypothyroidism   . Multinodular thyroid   . NIDDM (non-insulin dependent diabetes mellitus)   . Osteoporosis 2020   T score -2.5 stable from prior DEXA  . Radiation 01/31/15-03/01/15   right  breast  . Rheumatoid arthritis(714.0)    lower  back  . Wears contact lenses     PAST SURGICAL HISTORY: Past Surgical History:  Procedure Laterality Date  . BREAST LUMPECTOMY Right 2016  . BREAST SURGERY     Lumpectomy  . CESAREAN SECTION  [id number]   x 2  . CHOLECYSTECTOMY  1993  . COLONOSCOPY  last 06/15/2016   greater than 12 years  but not sure where colonoscopy was performed  . INCISION AND DRAINAGE ABSCESS Right 11/24/2014   Procedure: INCISION AND DRAINAGE RIGHT AXILLA  ABSCESS;  Surgeon: Claud Kelp, MD;  Location: [facility] OR;  Service: General;  Laterality: Right;  . RADIOACTIVE SEED GUIDED PARTIAL MASTECTOMY WITH AXILLARY SENTINEL LYMPH NODE BIOPSY Right 10/12/2014   Procedure: RIGHT  PARTIAL MASTECTOMY WITH RADIOACTIVE SEED LOCALIZATION  RIGHT  AXILLARY SENTINEL  NODE BIOPSY;  Surgeon: Claud Kelp, MD;  Location: [facility];  Service: General;  Laterality: Right;  . TUBAL LIGATION  1996   re annastomosis  . WISDOM TOOTH EXTRACTION      FAMILY HISTORY Family History  Problem Relation Age of Onset  . Diabetes Mother        niddm  . Heart disease Mother   . Heart failure Father   . Heart disease Father   . Ovarian cancer Cousin 37  . Hyperlipidemia Sister   . Colon cancer Maternal Uncle 70  . Esophageal cancer Neg Hx   . Rectal cancer Neg Hx   . Stomach cancer Neg Hx   . Colon polyps Neg Hx    the patient's father died from a myocardial infarction at age 88. The patient's mother died at age 67 from complications of diabetes. The patient had no brothers, one sister. There is no history of cancer in the immediate family but the patient has one cousin on the mother's side diagnosed with ovarian cancer before the age of 47. Also that cousin's father, the patient's mother's brother, was diagnosed with colon cancer at age 68   GYNECOLOGIC HISTORY:  No LMP recorded. Patient is postmenopausal. Menarche age 63, first live birth age 65. She is GX P2. She went through the change of life approximately age 84. 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 Her husband Jonette Mate is in the rental business. The patient's daughter Angie Fava works at [employer] in [place], and daughter Gean Maidens works at [employer]. The patient has 4  grandchildren. She attends our [employer]    ADVANCED DIRECTIVES: In the absence of any documentation to the contrary, the patient's spouse is their HCPOA.    HEALTH MAINTENANCE: Social History   Tobacco Use  . Smoking status: Never Smoker  . Smokeless tobacco: Never Used  Vaping Use  . Vaping Use: Never used  Substance Use Topics  . Alcohol use: Not Currently    Alcohol/week: 0.0 standard drinks  . Drug use: No     Colonoscopy: September 2017  PAP: 02/02/2019, normal  Bone density: Osteoporosis  Lipid panel:  Allergies  Allergen Reactions  . Shellfish Allergy Hives    unsure    Current Outpatient Medications  Medication Sig Dispense Refill  . acetaminophen (TYLENOL) 325 MG tablet Take 2 tablets (650 mg total) by mouth every 6 (six) hours as needed for mild pain (or Fever >/= 101).    Marland Kitchen amitriptyline (ELAVIL) 25 MG tablet TAKE ONE TABLET BY MOUTH AT BEDTIME  "OFFICE VISIT NEEDED" (Patient taking differently: Take 25  mg by mouth at bedtime as needed for sleep. ) 90 tablet 3  . aspirin 81 MG tablet Take 81 mg by mouth daily.    . Blood Glucose Monitoring Suppl ([place]) w/Device KIT     . Calcium Carbonate-Vitamin D 600-200 MG-UNIT TABS Take 1 tablet by mouth daily.     Marland Kitchen glimepiride (AMARYL) 2 MG tablet Take 2 mg by mouth daily before breakfast.    . Glucosamine 500 MG CAPS Take 1 capsule by mouth daily.    . hydrocortisone (ANUSOL-HC) 2.5 % rectal cream Place 1 application rectally 2 (two) times daily. Make sure cream is inside. (Patient taking differently: Place 1 application rectally daily as needed for hemorrhoids. Make sure cream is inside.) 30 g 1  . liraglutide (VICTOZA) 18 MG/3ML SOPN Inject 1.2 mg into the skin daily.    Marland Kitchen lisinopril (ZESTRIL) 2.5 MG tablet Take 2.5 mg by mouth daily.    . metFORMIN (GLUCOPHAGE-XR) 500 MG 24 hr tablet TAKE TWO TABLETS BY MOUTH TWICE DAILY (Patient taking differently: Take 1,000 mg by mouth in the  morning and at bedtime. ) 360 tablet 0  . Multiple Vitamin (MULTIVITAMIN) capsule Take 1 capsule by mouth daily.    . Omega-3 Fatty Acids (OMEGA-3 FISH OIL PO) Take 1 capsule by mouth daily.     Marland Kitchen oxybutynin (DITROPAN) 5 MG tablet Take 1 tablet (5 mg total) by mouth 2 (two) times daily. 60 tablet 0  . pioglitazone (ACTOS) 45 MG tablet Take 45 mg by mouth daily.    . predniSONE (DELTASONE) 50 MG tablet 50 mg PO 13, 7 and 1 hour before CT scan. Take Benadryl 50 mg PO 1 hour before CT scan. 3 tablet 0  . risedronate (ACTONEL) 150 MG tablet Take 1 tablet (150 mg total) by mouth every 30 (thirty) days. with water on empty stomach, nothing by mouth or lie down for next 30 minutes. 3 tablet 2  . simvastatin (ZOCOR) 20 MG tablet Take 1 tablet (20 mg total) by mouth at bedtime. 90 tablet 3  . tamoxifen (NOLVADEX) 20 MG tablet Take 1 tablet (20 mg total) by mouth daily. 90 tablet 4  . tiZANidine (ZANAFLEX) 2 MG tablet Take 2 mg by mouth daily as needed for muscle spasms.     Marland Kitchen triamcinolone cream (KENALOG) 0.1 % Apply 1 application topically daily as needed (dry skin).     Marland Kitchen trimethoprim (TRIMPEX) 100 MG tablet Take 100 mg by mouth 2 (two) times daily.    . TURMERIC PO Take 1 capsule by mouth daily.     No current facility-administered medications for this visit.    OBJECTIVE: Latin woman who appears well  Vitals:   07/29/20 1105  BP: 121/64  Pulse: 80  Resp: 18  Temp: 97.6 F (36.4 C)  SpO2: 99%     Body mass index is 25.28 kg/m.    ECOG FS:1 - Symptomatic but completely ambulatory  Sclerae unicteric, EOMs intact Wearing a mask No cervical or supraclavicular adenopathy Lungs no rales or rhonchi Heart regular rate and rhythm Abd soft, nontender, positive bowel sounds MSK no focal spinal tenderness, no upper extremity lymphedema; good range of motion right upper extremity Neuro: nonfocal, well oriented, appropriate affect Breasts: The right breast is status post lumpectomy and radiation.   There is no palpable mass.  There are no skin or nipple changes of concern.  The left breast is benign.  Both axillae are benign.   LAB RESULTS:  CMP  Component Value Date/Time   NA 140 07/29/2020 1049   NA 139 02/26/2017 0956   K 4.1 07/29/2020 1049   K 4.0 02/26/2017 0956   CL 107 07/29/2020 1049   CO2 24 07/29/2020 1049   CO2 25 02/26/2017 0956   GLUCOSE 163 (H) 07/29/2020 1049   GLUCOSE 252 (H) 02/26/2017 0956   BUN 16 07/29/2020 1049   BUN 19.7 02/26/2017 0956   CREATININE 0.77 07/29/2020 1049   CREATININE 0.8 02/26/2017 0956   CALCIUM 9.2 07/29/2020 1049   CALCIUM 9.4 02/26/2017 0956   PROT 7.0 07/29/2020 1049   PROT 7.0 02/26/2017 0956   ALBUMIN 3.8 07/29/2020 1049   ALBUMIN 3.7 02/26/2017 0956   AST 17 07/29/2020 1049   AST 20 02/26/2017 0956   ALT 17 07/29/2020 1049   ALT 24 02/26/2017 0956   ALKPHOS 61 07/29/2020 1049   ALKPHOS 61 02/26/2017 0956   BILITOT 0.6 07/29/2020 1049   BILITOT 0.44 02/26/2017 0956   GFRNONAA >60 07/29/2020 1049   GFRNONAA 89 09/27/2015 2045   GFRAA >60 02/04/2020 0858   GFRAA >60 02/26/2019 1133   GFRAA >89 09/27/2015 2045    INo results found for: SPEP, UPEP  Lab Results  Component Value Date   WBC 7.0 07/29/2020   NEUTROABS 4.1 07/29/2020   HGB 12.8 07/29/2020   HCT 38.0 07/29/2020   MCV 94.1 07/29/2020   PLT 244 07/29/2020      Chemistry      Component Value Date/Time   NA 140 07/29/2020 1049   NA 139 02/26/2017 0956   K 4.1 07/29/2020 1049   K 4.0 02/26/2017 0956   CL 107 07/29/2020 1049   CO2 24 07/29/2020 1049   CO2 25 02/26/2017 0956   BUN 16 07/29/2020 1049   BUN 19.7 02/26/2017 0956   CREATININE 0.77 07/29/2020 1049   CREATININE 0.8 02/26/2017 0956      Component Value Date/Time   CALCIUM 9.2 07/29/2020 1049   CALCIUM 9.4 02/26/2017 0956   ALKPHOS 61 07/29/2020 1049   ALKPHOS 61 02/26/2017 0956   AST 17 07/29/2020 1049   AST 20 02/26/2017 0956   ALT 17 07/29/2020 1049   ALT 24 02/26/2017 0956     BILITOT 0.6 07/29/2020 1049   BILITOT 0.44 02/26/2017 0956       No results found for: LABCA2  No components found for: LABCA125  No results for input(s): INR in the last 168 hours.  Urinalysis    Component Value Date/Time   COLORURINE STRAW (A) 12/31/2019 2122   APPEARANCEUR CLEAR 12/31/2019 2122   LABSPEC 1.025 12/31/2019 2122   PHURINE 5.0 12/31/2019 2122   GLUCOSEU >=500 (A) 12/31/2019 2122   HGBUR NEGATIVE 12/31/2019 2122   BILIRUBINUR NEGATIVE 12/31/2019 2122   BILIRUBINUR negative 09/27/2015 2103   KETONESUR 20 (A) 12/31/2019 2122   PROTEINUR NEGATIVE 12/31/2019 2122   UROBILINOGEN 0.2 09/27/2015 2103   UROBILINOGEN 0.2 04/19/2008 1937   NITRITE NEGATIVE 12/31/2019 2122   LEUKOCYTESUR NEGATIVE 12/31/2019 2122    STUDIES: MR BREAST BILATERAL W WO CONTRAST INC CAD  Result Date: 07/21/2020 CLINICAL DATA:  69 year old female with history of right breast cancer post lumpectomy and radiation therapy 2016. The patient has had a palpable abnormality in the upper-outer right breast for several years however feels as if this has recently enlarged and is associated with new radiating pain involving right axilla/right upper extremity. LABS:  Not applicable. EXAM: BILATERAL BREAST MRI WITH AND WITHOUT CONTRAST TECHNIQUE: Multiplanar, multisequence MR  images of both breasts were obtained prior to and following the intravenous administration of 6 ml of Gadavist Three-dimensional MR images were rendered by post-processing of the original MR data on an independent workstation. The three-dimensional MR images were interpreted, and findings are reported in the following complete MRI report for this study. Three dimensional images were evaluated at the independent interpreting workstation using the DynaCAD thin client. COMPARISON:  Previous exams. FINDINGS: Breast composition: b.  Scattered fibroglandular tissue. Background parenchymal enhancement: Mild. Right breast: Lumpectomy and radiation  changes are present within the right breast. Nonenhancing areolar/central skin thickening is likely related to prior radiation and appears stable mammographically. No suspicious enhancing masses or abnormal areas of enhancement in the right breast. Left breast: No suspicious enhancing masses or abnormal areas of enhancement in the left breast. Lymph nodes: No morphologically abnormal axillary or internal mammary lymph nodes. Ancillary findings:  None. IMPRESSION: 1.  No MRI evidence of malignancy in either breast. 2. No MRI findings to explain patient's palpable abnormality in the upper-outer right breast with associated radiating pain involving the right axilla/right upper extremity. RECOMMENDATION: 1. Recommend further management of right breast symptoms be based on clinical assessment. 2.  Recommend annual routine screening mammography, due June 2022. BI-RADS CATEGORY  2: Benign. Electronically Signed   By: Everlean Alstrom M.D.   On: 07/21/2020 12:05     ASSESSMENT: 69 y.o. BRCA negative [place] woman status post right breast upper inner quadrant biopsy 08/24/2014 for a clinical T1c N0, stage IA invasive ductal carcinoma, grade 1 or 2, strongly estrogen and progesterone receptor positive, with an MIB-1 of 12%, and no HER-2 amplification  (1) status post right lumpectomy and sentinel lymph node sampling 10/12/2014 for a pT1c pN0, stage IA invasive ductal carcinoma, grade 1, with ample margins; repeat HER-2 again negative  (a) Oncotype DX score of 14 predicts a 10 year outside the breast recurrence rate of 9% of the patient's only systemic therapy is tamoxifen for 5 years.  (2) Adjuvant radiation 01/31/2015-03/01/2015:  Right breast/ 42.72 Gy at 2.67 Gy per fraction x 21 fractions.  Right breast boost/ 10 Gy at 2 Gy per fraction x 5 fractions  (3) started tamoxifen 05/19/2015, completing 5 years July 2021  (4) genetic testing 04/26/2015 through the [employer] gene panel offered by [employer] [employer]  found no deleterious mutations in ATM, BARD1, BRCA1, BRCA2, BRIP1, CDH1, CHEK2, EPCAM, MLH1, MRE11A, MSH2, MSH6, MUTYH, NBN, NF1, PALB2, PMS2, PTEN, RAD50, RAD51C, RAD51D, SMARCA4, STK11, and TP53.   (5) [facility] density at [facility] 12/20/2016 shows osteoporosis   PLAN: Destiny Moody is now nearly 6 years out from definitive surgery for her breast cancer with no evidence of disease recurrence.  This is very favorable.  She continues to have discomfort in the right shoulder right scapula and right chest wall.  We have evaluated this in various ways without documenting any evidence of disease recurrence.  She is scheduled for a CT scan of the chest and we discussed that today.  She has a questionable history of contrast allergy.  We are going to do the CT without contrast.  I think she would benefit from physical therapy for the right upper extremity even though there is no swelling and she has a good range of motion.  We have placed that order.  She is feeling better overall off tamoxifen.  Otherwise she will see me again in 1 year.  She knows to call for any other issues that may develop before then  Total encounter time 25 minutes.Chauncey Cruel, MD   07/29/2020 11:34 AM Medical Oncology and Hematology [facility] [place], [place] [address] Tel. [phone]    Fax. [phone]   I, Wilburn Mylar, am acting as scribe for Dr. Virgie Dad. Rosario Kushner.  I, Lurline Del MD, have reviewed the above documentation for accuracy and completeness, and I agree with the above.   *Total Encounter Time as defined by the Centers for Medicare and Medicaid Services includes, in addition to the face-to-face time of a patient visit (documented in the note above) non-face-to-face time: obtaining and reviewing outside history, ordering and reviewing medications, tests or procedures, care coordination (communications with other health care professionals or  caregivers) and documentation in the medical record.

## 2020-07-29 ENCOUNTER — Other Ambulatory Visit: Payer: Self-pay | Admitting: *Deleted

## 2020-07-29 ENCOUNTER — Inpatient Hospital Stay: Payer: Medicare Other

## 2020-07-29 ENCOUNTER — Inpatient Hospital Stay: Payer: Medicare Other | Attending: Medical | Admitting: Oncology

## 2020-07-29 ENCOUNTER — Other Ambulatory Visit: Payer: Self-pay

## 2020-07-29 VITALS — BP 121/64 | HR 80 | Temp 97.6°F | Resp 18 | Ht 63.0 in | Wt 142.7 lb

## 2020-07-29 DIAGNOSIS — E1122 Type 2 diabetes mellitus with diabetic chronic kidney disease: Secondary | ICD-10-CM | POA: Diagnosis not present

## 2020-07-29 DIAGNOSIS — Z79810 Long term (current) use of selective estrogen receptor modulators (SERMs): Secondary | ICD-10-CM | POA: Insufficient documentation

## 2020-07-29 DIAGNOSIS — Z79899 Other long term (current) drug therapy: Secondary | ICD-10-CM | POA: Diagnosis not present

## 2020-07-29 DIAGNOSIS — Z7982 Long term (current) use of aspirin: Secondary | ICD-10-CM | POA: Diagnosis not present

## 2020-07-29 DIAGNOSIS — C50211 Malignant neoplasm of upper-inner quadrant of right female breast: Secondary | ICD-10-CM | POA: Insufficient documentation

## 2020-07-29 DIAGNOSIS — N189 Chronic kidney disease, unspecified: Secondary | ICD-10-CM | POA: Diagnosis not present

## 2020-07-29 DIAGNOSIS — Z170 Estrogen receptor positive status [ER+]: Secondary | ICD-10-CM

## 2020-07-29 DIAGNOSIS — M069 Rheumatoid arthritis, unspecified: Secondary | ICD-10-CM | POA: Diagnosis not present

## 2020-07-29 DIAGNOSIS — I129 Hypertensive chronic kidney disease with stage 1 through stage 4 chronic kidney disease, or unspecified chronic kidney disease: Secondary | ICD-10-CM | POA: Insufficient documentation

## 2020-07-29 DIAGNOSIS — E039 Hypothyroidism, unspecified: Secondary | ICD-10-CM | POA: Insufficient documentation

## 2020-07-29 DIAGNOSIS — F329 Major depressive disorder, single episode, unspecified: Secondary | ICD-10-CM | POA: Diagnosis not present

## 2020-07-29 DIAGNOSIS — Z923 Personal history of irradiation: Secondary | ICD-10-CM | POA: Diagnosis not present

## 2020-07-29 DIAGNOSIS — E7800 Pure hypercholesterolemia, unspecified: Secondary | ICD-10-CM | POA: Diagnosis not present

## 2020-07-29 LAB — CMP (CANCER CENTER ONLY)
ALT: 17 U/L (ref 0–44)
AST: 17 U/L (ref 15–41)
Albumin: 3.8 g/dL (ref 3.5–5.0)
Alkaline Phosphatase: 61 U/L (ref 38–126)
Anion gap: 9 (ref 5–15)
BUN: 16 mg/dL (ref 8–23)
CO2: 24 mmol/L (ref 22–32)
Calcium: 9.2 mg/dL (ref 8.9–10.3)
Chloride: 107 mmol/L (ref 98–111)
Creatinine: 0.77 mg/dL (ref 0.44–1.00)
GFR, Estimated: >60 mL/min (ref >60)
Glucose, Bld: 163 mg/dL — ABNORMAL HIGH (ref 70–99)
Potassium: 4.1 mmol/L (ref 3.5–5.1)
Sodium: 140 mmol/L (ref 135–145)
Total Bilirubin: 0.6 mg/dL (ref 0.3–1.2)
Total Protein: 7 g/dL (ref 6.5–8.1)

## 2020-07-29 LAB — CBC WITH DIFFERENTIAL (CANCER CENTER ONLY)
Abs Immature Granulocytes: 0.02 10*3/uL (ref 0.00–0.07)
Basophils Absolute: 0 10*3/uL (ref 0.0–0.1)
Basophils Relative: 0 %
Eosinophils Absolute: 0.1 10*3/uL (ref 0.0–0.5)
Eosinophils Relative: 2 %
HCT: 38 % (ref 36.0–46.0)
Hemoglobin: 12.8 g/dL (ref 12.0–15.0)
Immature Granulocytes: 0 %
Lymphocytes Relative: 31 %
Lymphs Abs: 2.2 10*3/uL (ref 0.7–4.0)
MCH: 31.7 pg (ref 26.0–34.0)
MCHC: 33.7 g/dL (ref 30.0–36.0)
MCV: 94.1 fL (ref 80.0–100.0)
Monocytes Absolute: 0.6 10*3/uL (ref 0.1–1.0)
Monocytes Relative: 8 %
Neutro Abs: 4.1 10*3/uL (ref 1.7–7.7)
Neutrophils Relative %: 59 %
Platelet Count: 244 10*3/uL (ref 150–400)
RBC: 4.04 MIL/uL (ref 3.87–5.11)
RDW: 12.6 % (ref 11.5–15.5)
WBC Count: 7 10*3/uL (ref 4.0–10.5)
nRBC: 0 % (ref 0.0–0.2)

## 2020-08-03 ENCOUNTER — Ambulatory Visit: Payer: Medicare Other | Attending: Oncology

## 2020-08-03 ENCOUNTER — Other Ambulatory Visit: Payer: Self-pay

## 2020-08-03 DIAGNOSIS — R293 Abnormal posture: Secondary | ICD-10-CM | POA: Diagnosis present

## 2020-08-03 DIAGNOSIS — M79601 Pain in right arm: Secondary | ICD-10-CM | POA: Diagnosis present

## 2020-08-03 DIAGNOSIS — R0789 Other chest pain: Secondary | ICD-10-CM | POA: Insufficient documentation

## 2020-08-03 NOTE — Therapy (Signed)
[place], [place], [address] Phone: [phone]   Fax:  [phone]  Physical Therapy Evaluation  Patient Details  Name: Destiny Moody MRN: [id number] Date of Birth: 11-02-1950 Referring Provider (PT): Dr. Jana Hakim   Encounter Date: 08/03/2020   PT End of Session - 08/03/20 1223    Visit Number 1    Number of Visits 13    Date for PT Re-Evaluation 09/14/20    PT Start Time 1115    PT Stop Time 1200    PT Time Calculation (min) 45 min    Activity Tolerance Patient tolerated treatment well    Behavior During Therapy [facility] for tasks assessed/performed           Past Medical History:  Diagnosis Date   Allergy    Arthritis    Breast cancer ([place]) 08/24/14   right, upper inner   Cancer ([place])    Chronic kidney disease    Depression    no meds   HSV (herpes simplex virus) anogenital infection 2018   Hypercholesterolemia    Hypertension    Hypothyroidism    Multinodular thyroid    NIDDM (non-insulin dependent diabetes mellitus)    Osteoporosis 2020   T score -2.5 stable from prior DEXA   Radiation 01/31/15-03/01/15   right  breast   Rheumatoid arthritis(714.0)    lower back   Wears contact lenses     Past Surgical History:  Procedure Laterality Date   BREAST LUMPECTOMY Right 2016   BREAST SURGERY     Lumpectomy   CESAREAN SECTION  6962,9528   x 2   CHOLECYSTECTOMY  1993   COLONOSCOPY  last 06/15/2016   greater than 12 years but not sure where colonoscopy was performed   INCISION AND DRAINAGE ABSCESS Right 11/24/2014   Procedure: INCISION AND DRAINAGE RIGHT AXILLA  ABSCESS;  Surgeon: Fanny Skates, MD;  Location: [place];  Service: General;  Laterality: Right;   RADIOACTIVE SEED GUIDED PARTIAL MASTECTOMY WITH AXILLARY SENTINEL LYMPH NODE BIOPSY Right 10/12/2014   Procedure: RIGHT  PARTIAL MASTECTOMY WITH RADIOACTIVE SEED LOCALIZATION  RIGHT  AXILLARY SENTINEL  NODE  BIOPSY;  Surgeon: Fanny Skates, MD;  Location: [place];  Service: General;  Laterality: Right;   TUBAL LIGATION  1996   re annastomosis   WISDOM TOOTH EXTRACTION      There were no vitals filed for this visit.    Subjective Assessment - 08/03/20 1118    Subjective Pt is referred for pain in the right shoulder, scapula and chest wall which has been present for about a month.  She also feels a nodule at her lateral trunk but has had mammography and [place] that was negative.  She will have a CT next week.  She feels pain started after vacation while she was lifting alot of heavy things.  She has had prior PT here in the past for similar reasons.    Pertinent History Pt had a right breast lumpectomy on 10/12/2014 followed by radiation.  She had an I and D for abcess on 11/24/2014 She has recently completed her 5 years of tamoxifen.    Limitations House hold activities    Diagnostic tests mammogram, [place] negative, pending CT    Patient Stated Goals Be able to use arm normally, reach better, do household chores like vacuuming, Driving    Currently in Pain? Yes    Pain Score 6     Pain Location Axilla  Pain Orientation Right    Pain Onset More than a month ago    Pain Frequency Intermittent              [facility] PT Assessment - 08/03/20 0001      Assessment   Medical Diagnosis Right shoulder/arm pain    Referring Provider (PT) Dr. Jana Hakim    Onset Date/Surgical Date 01//26/16    Hand Dominance Right    Prior Therapy yes      Precautions   Precautions --   lymphedema risk     Restrictions   Weight Bearing Restrictions No      Balance Screen   Has the patient fallen in the past 6 months Yes    How many times? 1    Has the patient had a decrease in activity level because of a fear of falling?  Yes    Is the patient reluctant to leave their home because of a fear of falling?  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   Overall Cognitive Status Within Functional Limits for tasks assessed      Observation/Other Assessments   Skin Integrity WNL      AROM   Right Shoulder Extension 49 Degrees    Right Shoulder Flexion 140 Degrees    Right Shoulder ABduction 127 Degrees    Right Shoulder Internal Rotation 63 Degrees    Right Shoulder External Rotation 110 Degrees    Left Shoulder Extension 68 Degrees    Left Shoulder Flexion 167 Degrees    Left Shoulder ABduction 174 Degrees    Left Shoulder Internal Rotation 68 Degrees    Left Shoulder External Rotation 110 Degrees      Palpation   Palpation comment --   palpable nodule that is tender at right lateral breast, pecs            LYMPHEDEMA/ONCOLOGY QUESTIONNAIRE - 08/03/20 0001      Surgeries   Lumpectomy Date 10/13/19    Other Surgery Date 11/24/14      Treatment   Active Chemotherapy Treatment No    Past Chemotherapy Treatment No    Active Radiation Treatment No    Past Radiation Treatment Yes    Past Hormone Therapy Yes    Drug Name tamoxifen      What other symptoms do you have   Are you Having Heaviness or Tightness Yes    Are you having Pain Yes   Right chest/right breast   Is it Hard or Difficult finding clothes that fit No    Do you have infections Yes    Comments breast abcess    Stemmer Sign No      Right Upper Extremity Lymphedema   At Axilla  31 cm    15 cm Proximal to Olecranon Process 30 cm    10 cm Proximal to Olecranon Process 27.5 cm    Olecranon Process 25 cm    15 cm Proximal to Ulnar Styloid Process 21.9 cm    10 cm Proximal to Ulnar Styloid Process 19.2 cm    Just Proximal to Ulnar Styloid Process 16.5 cm    Across Hand at [employer] 20 cm    At [place] of 2nd Digit  6.9 cm      Left Upper Extremity Lymphedema   At Axilla  31 cm    15 cm Proximal  to Olecranon Process 30 cm    10 cm Proximal to Olecranon Process 27.9 cm    Olecranon Process 25 cm    15 cm Proximal to Ulnar Styloid Process 22.1 cm    10 cm Proximal to Ulnar Styloid Process 20 cm    Just Proximal to Ulnar Styloid Process 16.7 cm    Across Hand at [employer] 20 cm    At [place] of 2nd Digit 7 cm                   Outpatient Rehab from 08/03/2020 [facility]  Lymphedema Life Impact Scale Total Score 51.47 %      Objective measurements completed on examination: See above findings.                    PT Long Term Goals - 08/03/20 1234      PT LONG TERM GOAL #1   Title Pt will be independant and compliant with HEP for right shoulder ROM/strength    Time 3    Period Weeks    Status New    Target Date 08/24/20      PT LONG TERM GOAL #2   Title Pt will report decreased pain/tenderness by greater than 50 % in arm and chest wall    Time 6    Period Weeks    Status New      PT LONG TERM GOAL #3   Title Pt will have right shoulder AROM equal to left for improved function    Time 6    Period Weeks    Status New      PT LONG TERM GOAL #4   Title Pt will be able to drive without increased pain    Time 4    Period Weeks    Status New      PT LONG TERM GOAL #5   Title Pt will be more tolerable of home activities    Time 6    Period Weeks    Status New                  Plan - 08/03/20 1223    Clinical Impression Statement Pt had prior right breast lumpectomy on 10/13/19 with SLNB and an I and D abcess on 11/24/2014.  She presents with complaints of right arm, chest wall and right and axilla UE pain that has been present for about a month.  She has a palpable nodule in right lateral trunk that is tender but has been screened with [place] and a mammogram and is so far reported to be negative. she is limited with reaching, lifting and household chores, as well as driving. She is very tight in her  pectorals and axillar region.  There is no measureable difference in circumference in right arm.    Personal Factors and Comorbidities Comorbidity 2    Comorbidities s/p right lumpectomy with radiation, diabetes    Examination-Activity Limitations Reach Overhead;Other   driving, lifting   Examination-Participation Restrictions Cleaning;Driving    Stability/Clinical Decision Making Stable/Uncomplicated    Clinical Decision Making Low    Rehab Potential Excellent    PT Frequency 2x / week    PT Duration 6 weeks    PT Treatment/Interventions ADLs/Self Care Home Management;Therapeutic activities;Therapeutic exercise;Manual techniques;Patient/family education;Manual lymph drainage;Passive  range of motion    PT Next Visit Plan STW to right chest wall, [place], PROM, progress to scapular strength, AAROM exs for home    Consulted and Agree with Plan of Care Patient           Patient will benefit from skilled therapeutic intervention in order to improve the following deficits and impairments:  Decreased activity tolerance, Decreased knowledge of precautions, Decreased range of motion, Decreased strength, Postural dysfunction, Impaired UE functional use, Pain  Visit Diagnosis: Pain in right arm  Chest wall pain  Abnormal posture     Problem List Patient Active Problem List   Diagnosis Date Noted   Sepsis due to urinary tract infection ([place]) 08/26/2019   Acute pyelonephritis 08/26/2019   Sepsis secondary to UTI ([place]) 05/08/2019   Hyperlipidemia associated with type 2 diabetes mellitus ([place])    History of breast cancer    Hypertension    Hypothyroidism    Age-related osteoporosis without current pathological fracture 12/27/2016   Genital herpes in women 12/20/2016   Wrist pain, chronic, right 08/01/2016   Genetic testing 04/27/2015   Family history of colon cancer    Family history of ovarian cancer    Diabetes mellitus type II, non insulin dependent ([place]) 03/31/2015    Malignant neoplasm of upper-inner quadrant of right breast in female, estrogen receptor positive ([place]) 08/26/2014   Osteoporosis 08/10/2014   Vaginal atrophy 04/27/2014   Diabetes ([place]) 04/17/2011   GERD 04/07/2008   FATTY LIVER DISEASE 04/07/2008    Elsie Ra Teofil Maniaci 08/03/2020, 12:39 PM  [place], [place], [address] Phone: [phone]   Fax:  [phone]  Name: Shakelia Scrivner MRN: [id number] Date of Birth: [birth year] Cheral Almas, PT 08/03/20 12:41 PM

## 2020-08-08 ENCOUNTER — Other Ambulatory Visit: Payer: Self-pay | Admitting: *Deleted

## 2020-08-08 DIAGNOSIS — I739 Peripheral vascular disease, unspecified: Secondary | ICD-10-CM

## 2020-08-09 ENCOUNTER — Other Ambulatory Visit: Payer: Self-pay

## 2020-08-09 ENCOUNTER — Encounter: Payer: Self-pay | Admitting: Oncology

## 2020-08-09 ENCOUNTER — Ambulatory Visit (HOSPITAL_COMMUNITY)
Admission: RE | Admit: 2020-08-09 | Discharge: 2020-08-09 | Disposition: A | Discharge status: Home / Self Care | Payer: Medicare Other | Source: Ambulatory Visit | Attending: Oncology | Admitting: Oncology

## 2020-08-09 ENCOUNTER — Other Ambulatory Visit: Payer: Self-pay | Admitting: Oncology

## 2020-08-09 DIAGNOSIS — C50211 Malignant neoplasm of upper-inner quadrant of right female breast: Secondary | ICD-10-CM | POA: Insufficient documentation

## 2020-08-09 DIAGNOSIS — Z170 Estrogen receptor positive status [ER+]: Secondary | ICD-10-CM | POA: Insufficient documentation

## 2020-08-09 DIAGNOSIS — H903 Sensorineural hearing loss, bilateral: Secondary | ICD-10-CM | POA: Insufficient documentation

## 2020-08-16 ENCOUNTER — Encounter: Payer: Self-pay | Admitting: Vascular Surgery

## 2020-08-16 ENCOUNTER — Ambulatory Visit (INDEPENDENT_AMBULATORY_CARE_PROVIDER_SITE_OTHER): Payer: Medicare Other | Admitting: Vascular Surgery

## 2020-08-16 ENCOUNTER — Ambulatory Visit (HOSPITAL_COMMUNITY)
Admission: RE | Admit: 2020-08-16 | Discharge: 2020-08-16 | Disposition: A | Discharge status: Home / Self Care | Payer: Medicare Other | Source: Ambulatory Visit | Attending: Vascular Surgery | Admitting: Vascular Surgery

## 2020-08-16 ENCOUNTER — Other Ambulatory Visit: Payer: Self-pay

## 2020-08-16 VITALS — BP 112/76 | HR 76 | Temp 97.2°F | Resp 20 | Ht 63.0 in | Wt 144.0 lb

## 2020-08-16 DIAGNOSIS — I739 Peripheral vascular disease, unspecified: Secondary | ICD-10-CM

## 2020-08-16 DIAGNOSIS — I8393 Asymptomatic varicose veins of bilateral lower extremities: Secondary | ICD-10-CM | POA: Diagnosis not present

## 2020-08-16 NOTE — Progress Notes (Signed)
ASSESSMENT & PLAN:  69 y.o. female with episodic lower extremity cramping. No evidence of hemodynamically significant peripheral arterial disease to explain cramping. 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 Scattered reticular veins across bilateral lower extremities.  Recommend trial of compression of at least three months before return to consider intervention for varicosities. Return in three months with venous reflux duplex.   CHIEF COMPLAINT:   Cramping in legs  HISTORY:  HISTORY OF PRESENT ILLNESS: Destiny Moody is a 69 y.o. female with history of DM2, breast cancer status post breast conserving therapy, hyperlipidemia.  She is referred to the office for evaluation of cramping in her lower extremities.  She reports the cramping can occur at anytime of day.  The cramping is most notable when it wakes her from sleep.  She does not relate the cramping to activity or ambulation.  She reports she takes in very little salt, and drinks a lot of water throughout the day.  She has some cosmetic concerns about reticular veins and varicose veins across the lower extremities bilaterally.  These veins do not seem to be particularly bothersome to her from a symptom standpoint.  Past Medical History:  Diagnosis Date  . Allergy   . Arthritis   . Breast cancer ([place]) 08/24/14   right, upper inner  . Cancer ([place])   . Chronic kidney disease   . Depression    no meds  . HSV (herpes simplex virus) anogenital infection 2018  . Hypercholesterolemia   . Hypertension   . Hypothyroidism   . Multinodular thyroid   . NIDDM (non-insulin dependent diabetes mellitus)   . Osteoporosis 2020   T score -2.5 stable from prior DEXA  . Radiation 01/31/15-03/01/15   right  breast  . Rheumatoid arthritis(714.0)    lower back  . Wears contact lenses     Past Surgical History:  Procedure Laterality Date  . BREAST LUMPECTOMY Right 2016  . BREAST SURGERY     Lumpectomy  . CESAREAN  SECTION  [id number]   x 2  . CHOLECYSTECTOMY  1993  . COLONOSCOPY  last 06/15/2016   greater than 12 years but not sure where colonoscopy was performed  . INCISION AND DRAINAGE ABSCESS Right 11/24/2014   Procedure: INCISION AND DRAINAGE RIGHT AXILLA  ABSCESS;  Surgeon: Fanny Skates, MD;  Location: [place];  Service: General;  Laterality: Right;  . RADIOACTIVE SEED GUIDED PARTIAL MASTECTOMY WITH AXILLARY SENTINEL LYMPH NODE BIOPSY Right 10/12/2014   Procedure: RIGHT  PARTIAL MASTECTOMY WITH RADIOACTIVE SEED LOCALIZATION  RIGHT  AXILLARY SENTINEL  NODE BIOPSY;  Surgeon: Fanny Skates, MD;  Location: [place];  Service: General;  Laterality: Right;  . TUBAL LIGATION  1996   re annastomosis  . WISDOM TOOTH EXTRACTION      Family History  Problem Relation Age of Onset  . Diabetes Mother        niddm  . Heart disease Mother   . Heart failure Father   . Heart disease Father   . Ovarian cancer Cousin 37  . Hyperlipidemia Sister   . Colon cancer Maternal Uncle 8  . Esophageal cancer Neg Hx   . Rectal cancer Neg Hx   . Stomach cancer Neg Hx   . Colon polyps Neg Hx     Social History   Socioeconomic History  . Marital status: Married    Spouse name: Arnoldo  . Number of children: 2  . Years of education: College  . Highest  education level: Not on file  Occupational History    Comment: Self Employed  Tobacco Use  . Smoking status: Never Smoker  . Smokeless tobacco: Never Used  Vaping Use  . Vaping Use: Never used  Substance and Sexual Activity  . Alcohol use: Not Currently    Alcohol/week: 0.0 standard drinks  . Drug use: No  . Sexual activity: Yes    Birth control/protection: Post-menopausal    Comment: 1st intercourse 31 yo-5 partners  Other Topics Concern  . Not on file  Social History Narrative   Patient lives at home with her spouse.   Caffeine use: 1 cup daily   Social Determinants of Health   Financial Resource Strain:   . Difficulty of Paying  Living Expenses: Not on file  Food Insecurity:   . Worried About Programme researcher, broadcasting/film/video in the Last Year: Not on file  . Ran Out of Food in the Last Year: Not on file  Transportation Needs:   . Lack of Transportation (Medical): Not on file  . Lack of Transportation (Non-Medical): Not on file  Physical Activity:   . Days of Exercise per Week: Not on file  . Minutes of Exercise per Session: Not on file  Stress:   . Feeling of Stress : Not on file  Social Connections:   . Frequency of Communication with Friends and Family: Not on file  . Frequency of Social Gatherings with Friends and Family: Not on file  . Attends Religious Services: Not on file  . Active Member of Clubs or Organizations: Not on file  . Attends Banker Meetings: Not on file  . Marital Status: Not on file  Intimate Partner Violence:   . Fear of Current or Ex-Partner: Not on file  . Emotionally Abused: Not on file  . Physically Abused: Not on file  . Sexually Abused: Not on file    Allergies  Allergen Reactions  . Shellfish Allergy Hives    unsure    Current Outpatient Medications  Medication Sig Dispense Refill  . acetaminophen (TYLENOL) 325 MG tablet Take 2 tablets (650 mg total) by mouth every 6 (six) hours as needed for mild pain (or Fever >/= 101).    Marland Kitchen amitriptyline (ELAVIL) 25 MG tablet TAKE ONE TABLET BY MOUTH AT BEDTIME  "OFFICE VISIT NEEDED" (Patient taking differently: Take 25 mg by mouth at bedtime as needed for sleep. ) 90 tablet 3  . aspirin 81 MG tablet Take 81 mg by mouth daily.    . Blood Glucose Monitoring Suppl (ONETOUCH VERIO FLEX SYSTEM) w/Device KIT     . Calcium Carbonate-Vitamin D 600-200 MG-UNIT TABS Take 1 tablet by mouth daily.     Marland Kitchen glimepiride (AMARYL) 2 MG tablet Take 2 mg by mouth daily before breakfast.    . Glucosamine 500 MG CAPS Take 1 capsule by mouth daily.    . hydrocortisone (ANUSOL-HC) 2.5 % rectal cream Place 1 application rectally 2 (two) times daily. Make sure  cream is inside. (Patient taking differently: Place 1 application rectally daily as needed for hemorrhoids. Make sure cream is inside.) 30 g 1  . liraglutide (VICTOZA) 18 MG/3ML SOPN Inject 1.2 mg into the skin daily.    Marland Kitchen lisinopril (ZESTRIL) 2.5 MG tablet Take 2.5 mg by mouth daily.    . metFORMIN (GLUCOPHAGE-XR) 500 MG 24 hr tablet TAKE TWO TABLETS BY MOUTH TWICE DAILY (Patient taking differently: Take 1,000 mg by mouth in the morning and at bedtime. ) 360 tablet  0  . Multiple Vitamin (MULTIVITAMIN) capsule Take 1 capsule by mouth daily.    . Omega-3 Fatty Acids (OMEGA-3 FISH OIL PO) Take 1 capsule by mouth daily.     Marland Kitchen oxybutynin (DITROPAN) 5 MG tablet Take 1 tablet (5 mg total) by mouth 2 (two) times daily. 60 tablet 0  . pioglitazone (ACTOS) 45 MG tablet Take 45 mg by mouth daily.    . predniSONE (DELTASONE) 50 MG tablet 50 mg PO 13, 7 and 1 hour before CT scan. Take Benadryl 50 mg PO 1 hour before CT scan. 3 tablet 0  . risedronate (ACTONEL) 150 MG tablet Take 1 tablet (150 mg total) by mouth every 30 (thirty) days. with water on empty stomach, nothing by mouth or lie down for next 30 minutes. 3 tablet 2  . simvastatin (ZOCOR) 20 MG tablet Take 1 tablet (20 mg total) by mouth at bedtime. 90 tablet 3  . tamoxifen (NOLVADEX) 20 MG tablet Take 1 tablet (20 mg total) by mouth daily. 90 tablet 4  . tiZANidine (ZANAFLEX) 2 MG tablet Take 2 mg by mouth daily as needed for muscle spasms.     Marland Kitchen triamcinolone cream (KENALOG) 0.1 % Apply 1 application topically daily as needed (dry skin).     Marland Kitchen trimethoprim (TRIMPEX) 100 MG tablet Take 100 mg by mouth 2 (two) times daily.    . TURMERIC PO Take 1 capsule by mouth daily.     No current facility-administered medications for this visit.    REVIEW OF SYSTEMS:  _0  denotes positive finding, _1  denotes negative finding Cardiac  Comments:  Chest pain or chest pressure:    Shortness of breath upon exertion:    Short of breath when lying flat:      Irregular heart rhythm:        Vascular    Pain in calf, thigh, or hip brought on by ambulation: x   Pain in feet at night that wakes you up from your sleep:     Blood clot in your veins: x   Leg swelling:  x       Pulmonary    Oxygen at home:    Productive cough:     Wheezing:         Neurologic    Sudden weakness in arms or legs:  x   Sudden numbness in arms or legs:     Sudden onset of difficulty speaking or slurred speech:    Temporary loss of vision in one eye:     Problems with dizziness:  x       Gastrointestinal    Blood in stool:     Vomited blood:         Genitourinary    Burning when urinating:     Blood in urine:        Psychiatric    Major depression:         Hematologic    Bleeding problems:    Problems with blood clotting too easily:        Skin    Rashes or ulcers:        Constitutional    Fever or chills:     PHYSICAL EXAM:   Vitals:   08/16/20 1444  BP: 112/76  Pulse: 76  Resp: 20  Temp: (!) 97.2 F (36.2 C)  SpO2: 100%  Weight: 144 lb (65.3 kg)  Height: _2  (1.6 m)    Constitutional: Well appearing in no distress. Appears well  nourished.  Neurologic: Normal gait and station. CN intact. No weakness. No sensory loss. Psychiatric: Mood and affect symmetric and appropriate. Eyes: No icterus. No conjunctival pallor. Ears, nose, throat: mucous membranes moist. Midline trachea. No carotid bruit. Cardiac: regular rate and rhythm.  Respiratory: unlabored. Abdominal: soft, non-tender, non-distended. No palpable pulsatile abdominal mass. Peripheral vascular:  Dorsalis pedis pulse: L 1+ / R 1+  Posterior tibial pulse: L 1+ / R 1+ Extremity: No edema. No cyanosis. No pallor.  Skin: No gangrene. No ulceration.  Lymphatic: No Stemmer's sign. No palpable lymphadenopathy.   DATA REVIEW:    Most recent CBC CBC Latest Ref Rng & Units 07/29/2020 02/04/2020 12/31/2019  WBC 4.0 - 10.5 K/uL 7.0 7.1 10.6(H)  Hemoglobin 12.0 - 15.0 g/dL 12.8  12.6 12.7  Hematocrit 36 - 46 % 38.0 37.7 38.2  Platelets 150 - 400 K/uL 244 242 248     Most recent CMP CMP Latest Ref Rng & Units 07/29/2020 02/04/2020 12/31/2019  Glucose 70 - 99 mg/dL 163(H) 159(H) 438(H)  BUN 8 - 23 mg/dL 16 17 27(H)  Creatinine 0.44 - 1.00 mg/dL 0.77 0.79 0.82  Sodium 135 - 145 mmol/L 140 140 134(L)  Potassium 3.5 - 5.1 mmol/L 4.1 4.3 4.7  Chloride 98 - 111 mmol/L 107 107 103  CO2 22 - 32 mmol/L 24 22 20(L)  Calcium 8.9 - 10.3 mg/dL 9.2 9.1 9.5  Total Protein 6.5 - 8.1 g/dL 7.0 7.1 -  Total Bilirubin 0.3 - 1.2 mg/dL 0.6 0.5 -  Alkaline Phos 38 - 126 U/L 61 58 -  AST 15 - 41 U/L 17 20 -  ALT 0 - 44 U/L 17 18 -    Renal function Estimated Creatinine Clearance: 62.1 mL/min (by C-G formula based on SCr of 0.77 mg/dL).  Hgb A1c MFr Bld (%)  Date Value  08/26/2019 7.0 (H)    LDL Cholesterol  Date Value Ref Range Status  07/14/2016 76 <130 mg/dL Final    Comment:      Total Cholesterol/HDL Ratio:CHD Risk                        Coronary Heart Disease Risk Table                                        Men       Women          1/2 Average Risk              3.4        3.3              Average Risk              5.0        4.4           2X Average Risk              9.6        7.1           3X Average Risk             23.4       11.0 Use the calculated Patient Ratio above and the CHD Risk table  to determine the patient's CHD Risk.      Vascular Imaging: ABI 08/16/20    Yevonne Aline. Stanford Breed, MD [facility] Phone Number: [phone] 08/16/2020 3:05 PM

## 2020-08-17 ENCOUNTER — Other Ambulatory Visit: Payer: Self-pay

## 2020-08-17 ENCOUNTER — Ambulatory Visit: Payer: Medicare Other | Attending: Oncology

## 2020-08-17 DIAGNOSIS — M79601 Pain in right arm: Secondary | ICD-10-CM | POA: Diagnosis not present

## 2020-08-17 DIAGNOSIS — R0789 Other chest pain: Secondary | ICD-10-CM | POA: Diagnosis present

## 2020-08-17 DIAGNOSIS — R293 Abnormal posture: Secondary | ICD-10-CM | POA: Insufficient documentation

## 2020-08-17 DIAGNOSIS — I8393 Asymptomatic varicose veins of bilateral lower extremities: Secondary | ICD-10-CM

## 2020-08-17 NOTE — Therapy (Signed)
[place], [place], [address] Phone: [phone]   Fax:  [phone]  Physical Therapy Treatment  Patient Details  Name: Destiny Moody MRN: [id number] Date of Birth: June 08, 1951 Referring Provider (PT): Dr. Jana Hakim   Encounter Date: 08/17/2020   PT End of Session - 08/17/20 1201    Visit Number 2    Number of Visits 13    Date for PT Re-Evaluation 09/14/20    PT Start Time 1104    PT Stop Time 1154    PT Time Calculation (min) 50 min    Activity Tolerance Patient tolerated treatment well    Behavior During Therapy [facility] for tasks assessed/performed           Past Medical History:  Diagnosis Date  . Allergy   . Arthritis   . Breast cancer ([place]) 08/24/14   right, upper inner  . Cancer ([place])   . Chronic kidney disease   . Depression    no meds  . HSV (herpes simplex virus) anogenital infection 2018  . Hypercholesterolemia   . Hypertension   . Hypothyroidism   . Multinodular thyroid   . NIDDM (non-insulin dependent diabetes mellitus)   . Osteoporosis 2020   T score -2.5 stable from prior DEXA  . Radiation 01/31/15-03/01/15   right  breast  . Rheumatoid arthritis(714.0)    lower back  . Wears contact lenses     Past Surgical History:  Procedure Laterality Date  . BREAST LUMPECTOMY Right 2016  . BREAST SURGERY     Lumpectomy  . CESAREAN SECTION  6294,[id number]   x 2  . CHOLECYSTECTOMY  1993  . COLONOSCOPY  last 06/15/2016   greater than 12 years but not sure where colonoscopy was performed  . INCISION AND DRAINAGE ABSCESS Right 11/24/2014   Procedure: INCISION AND DRAINAGE RIGHT AXILLA  ABSCESS;  Surgeon: Fanny Skates, MD;  Location: [place];  Service: General;  Laterality: Right;  . RADIOACTIVE SEED GUIDED PARTIAL MASTECTOMY WITH AXILLARY SENTINEL LYMPH NODE BIOPSY Right 10/12/2014   Procedure: RIGHT  PARTIAL MASTECTOMY WITH RADIOACTIVE SEED LOCALIZATION  RIGHT  AXILLARY SENTINEL  NODE BIOPSY;   Surgeon: Fanny Skates, MD;  Location: [place];  Service: General;  Laterality: Right;  . TUBAL LIGATION  1996   re annastomosis  . WISDOM TOOTH EXTRACTION      There were no vitals filed for this visit.   Subjective Assessment - 08/17/20 1104    Subjective Had a CT last week of nodular area at right lateral breast that was also negative. She has another CT of her head and neck next week looking at her thyroid. Still painful in the night in the elbow area.  Axilla is still tight, Still feel the nodule in her right lateral breast area that is tender.    Pertinent History Pt had a right breast lumpectomy on 10/12/2014 followed by radiation.  She had an I and D for abcess on 11/24/2014 She has recently completed her 5 years of tamoxifen.    Patient Stated Goals Be able to use arm normally, reach better, do household chores like vacuuming, Driving    Currently in Pain? Yes    Pain Score 8     Pain Location Shoulder    Pain Orientation Right    Pain Descriptors / Indicators Tightness;Throbbing    Pain Type Chronic pain    Pain Onset More than a month ago    Pain Frequency Intermittent  [facility]  Lymphedema Life Impact Scale Total Score 51.47 %            [facility] Adult PT Treatment/Exercise - 08/17/20 0001      Manual Therapy   Soft tissue mobilization STW to right pectorals, axillary area and scapular region with coconut oil.  Large nodular area noted at lateral breast that does not seem to change.    Myofascial Release MFR to lateral trunk, axilla area during ROM    Passive ROM Passive ROM right shoulder with VC's to pt to relax.                  PT Education - 08/17/20 1200    Education Details Pt educated in supine AAAROM for flex and International aid/development worker and given illustrated instructions    Person(s) Educated Patient    Methods  Explanation;Demonstration;Handout    Comprehension Returned demonstration;Verbalized understanding               PT Long Term Goals - 08/03/20 1234      PT LONG TERM GOAL #1   Title Pt will be independant and compliant with HEP for right shoulder ROM/strength    Time 3    Period Weeks    Status New    Target Date 08/24/20      PT LONG TERM GOAL #2   Title Pt will report decreased pain/tenderness by greater than 50 % in arm and chest wall    Time 6    Period Weeks    Status New      PT LONG TERM GOAL #3   Title Pt will have right shoulder AROM equal to left for improved function    Time 6    Period Weeks    Status New      PT LONG TERM GOAL #4   Title Pt will be able to drive without increased pain    Time 4    Period Weeks    Status New      PT LONG TERM GOAL #5   Title Pt will be more tolerable of home activities    Time 6    Period Weeks    Status New                 Plan - 08/17/20 1202    Clinical Impression Statement Pt continues with palpable nodular area in right lateral breast that was determined to be negative with [place], mammogram and CT.  She is still complaining of right arm, chest wall and axilla pain over the last month.  She is limited with use of her right arm for household chores.  Treatment consisted of soft tissue work to pectorals, scapular area and axilla, as well as Right shoulder PROM with multiple VC's to relax and instruction in beginning level HEP.  She was tender throughout pectoral and scapular region and there was no noticeable difference in nodular area after rx.    Personal Factors and Comorbidities Comorbidity 2    Comorbidities s/p right lumpectomy with radiation, diabetes    Examination-Activity Limitations Reach Overhead;Other    Examination-Participation Restrictions Cleaning;Driving    Stability/Clinical Decision Making Stable/Uncomplicated    Clinical Decision Making Low    Rehab Potential Excellent    PT Frequency 2x /  week    PT Duration 6 weeks    PT Treatment/Interventions ADLs/Self Care Home Management;Therapeutic activities;Therapeutic exercise;Manual techniques;Patient/family education;Manual lymph drainage;Passive range of motion  PT Next Visit Plan STW to right chest wall, Pecs, PROM, progress to scapular strength, AAROM exs for home    PT Home Exercise Plan AAclasped hands flexion in supine, Stargazer in supine    Consulted and Agree with Plan of Care Patient           Patient will benefit from skilled therapeutic intervention in order to improve the following deficits and impairments:  Decreased activity tolerance, Decreased knowledge of precautions, Decreased range of motion, Decreased strength, Postural dysfunction, Impaired UE functional use, Pain  Visit Diagnosis: Pain in right arm  Chest wall pain  Abnormal posture     Problem List Patient Active Problem List   Diagnosis Date Noted  . Sepsis due to urinary tract infection ([place]) 08/26/2019  . Acute pyelonephritis 08/26/2019  . Sepsis secondary to UTI ([place]) 05/08/2019  . Hyperlipidemia associated with type 2 diabetes mellitus ([place])   . History of breast cancer   . Hypertension   . Hypothyroidism   . Age-related osteoporosis without current pathological fracture 12/27/2016  . Genital herpes in women 12/20/2016  . Wrist pain, chronic, right 08/01/2016  . Genetic testing 04/27/2015  . Family history of colon cancer   . Family history of ovarian cancer   . Diabetes mellitus type II, non insulin dependent ([place]) 03/31/2015  . Malignant neoplasm of upper-inner quadrant of right breast in female, estrogen receptor positive ([place]) 08/26/2014  . Osteoporosis 08/10/2014  . Vaginal atrophy 04/27/2014  . Diabetes ([place]) 04/17/2011  . GERD 04/07/2008  . FATTY LIVER DISEASE 04/07/2008    Claris Pong 08/17/2020, 12:10 PM  [place], [place],  [address] Phone: [phone]   Fax:  [phone]  Name: Labria Wos MRN: [id number] Date of Birth: [birth year] Cheral Almas, PT 08/17/20 12:13 PM

## 2020-08-17 NOTE — Patient Instructions (Signed)
Pt was educated in supine AA flex with clasped hands and supine stargazer stretch and was given written instructions.  Educated not to push into pain, but just a gentle stretch with 5 sec hold

## 2020-08-19 ENCOUNTER — Ambulatory Visit: Payer: Medicare Other

## 2020-08-19 ENCOUNTER — Other Ambulatory Visit: Payer: Self-pay

## 2020-08-19 DIAGNOSIS — M79601 Pain in right arm: Secondary | ICD-10-CM | POA: Diagnosis not present

## 2020-08-19 DIAGNOSIS — R293 Abnormal posture: Secondary | ICD-10-CM

## 2020-08-19 DIAGNOSIS — R0789 Other chest pain: Secondary | ICD-10-CM

## 2020-08-19 NOTE — Therapy (Signed)
[place], [place], [address] Phone: [phone]   Fax:  [phone]  Physical Therapy Treatment  Patient Details  Name: Destiny Moody MRN: [id number] Date of Birth: 1950-12-13 Referring Provider (PT): Dr. Jana Hakim   Encounter Date: 08/19/2020   PT End of Session - 08/19/20 1154    Visit Number 3    Number of Visits 13    Date for PT Re-Evaluation 09/14/20    PT Start Time 1055    PT Stop Time 1152    PT Time Calculation (min) 57 min    Activity Tolerance Patient tolerated treatment well    Behavior During Therapy [facility] for tasks assessed/performed           Past Medical History:  Diagnosis Date  . Allergy   . Arthritis   . Breast cancer ([place]) 08/24/14   right, upper inner  . Cancer ([place])   . Chronic kidney disease   . Depression    no meds  . HSV (herpes simplex virus) anogenital infection 2018  . Hypercholesterolemia   . Hypertension   . Hypothyroidism   . Multinodular thyroid   . NIDDM (non-insulin dependent diabetes mellitus)   . Osteoporosis 2020   T score -2.5 stable from prior DEXA  . Radiation 01/31/15-03/01/15   right  breast  . Rheumatoid arthritis(714.0)    lower back  . Wears contact lenses     Past Surgical History:  Procedure Laterality Date  . BREAST LUMPECTOMY Right 2016  . BREAST SURGERY     Lumpectomy  . CESAREAN SECTION  [id number]   x 2  . CHOLECYSTECTOMY  1993  . COLONOSCOPY  last 06/15/2016   greater than 12 years but not sure where colonoscopy was performed  . INCISION AND DRAINAGE ABSCESS Right 11/24/2014   Procedure: INCISION AND DRAINAGE RIGHT AXILLA  ABSCESS;  Surgeon: Fanny Skates, MD;  Location: [place];  Service: General;  Laterality: Right;  . RADIOACTIVE SEED GUIDED PARTIAL MASTECTOMY WITH AXILLARY SENTINEL LYMPH NODE BIOPSY Right 10/12/2014   Procedure: RIGHT  PARTIAL MASTECTOMY WITH RADIOACTIVE SEED LOCALIZATION  RIGHT  AXILLARY SENTINEL  NODE BIOPSY;   Surgeon: Fanny Skates, MD;  Location: [place];  Service: General;  Laterality: Right;  . TUBAL LIGATION  1996   re annastomosis  . WISDOM TOOTH EXTRACTION      There were no vitals filed for this visit.   Subjective Assessment - 08/19/20 1055    Subjective I was sore the day after I was here.  I don't think it was too much. The day I was here it was OK. Have been doing the exercises at home    Pertinent History Pt had a right breast lumpectomy on 10/12/2014 followed by radiation.  She had an I and D for abcess on 11/24/2014 She has recently completed her 5 years of tamoxifen.    Diagnostic tests mammogram, [place] negative,  CT negative    Patient Stated Goals Be able to use arm normally, reach better, do household chores like vacuuming, Driving    Currently in Pain? No/denies    Pain Onset More than a month ago                       Outpatient Rehab from 08/03/2020 in [place]  Lymphedema Life Impact Scale Total Score 51.47 %            [facility] Adult PT Treatment/Exercise - 08/19/20  0001      Shoulder Exercises: Pulleys   Flexion 1 minute    ABduction 1 minute      Shoulder Exercises: Therapy Ball   Flexion Both;10 reps   on wall     Manual Therapy   Soft tissue mobilization STW to right pectorals, axillary area and scapular region with coconut oil.  Large nodular area noted at lateral breast that does not seem to change.    Myofascial Release MFR to lateral trunk, axilla area during ROM    Passive ROM Passive ROM right shoulder with VC's to pt to relax.                       PT Long Term Goals - 08/03/20 1234      PT LONG TERM GOAL #1   Title Pt will be independant and compliant with HEP for right shoulder ROM/strength    Time 3    Period Weeks    Status New    Target Date 08/24/20      PT LONG TERM GOAL #2   Title Pt will report decreased pain/tenderness by greater than 50 % in arm and  chest wall    Time 6    Period Weeks    Status New      PT LONG TERM GOAL #3   Title Pt will have right shoulder AROM equal to left for improved function    Time 6    Period Weeks    Status New      PT LONG TERM GOAL #4   Title Pt will be able to drive without increased pain    Time 4    Period Weeks    Status New      PT LONG TERM GOAL #5   Title Pt will be more tolerable of home activities    Time 6    Period Weeks    Status New                 Plan - 08/19/20 1154    Clinical Impression Statement Pt complains of right arm, chest wall and axilla pain, but had no pain when first arriving today..  Treatment consisted of soft tissue work to pectorals, scapular area, UT and lats as well as right shoulder PROM with MFR to chest, and axilla. Pt does not relax well.  She also notes right elbow pain and is tender over the lateral epicondyle and forearm. We should include forearm next visit. Pt reminded multiple times with exercises to keep in comfortable ROM    Personal Factors and Comorbidities Comorbidity 2    Comorbidities s/p right lumpectomy with radiation, diabetes    Examination-Activity Limitations Reach Overhead;Other    Examination-Participation Restrictions Cleaning;Driving    Stability/Clinical Decision Making Stable/Uncomplicated    Clinical Decision Making Low    Rehab Potential Excellent    PT Frequency 2x / week    PT Duration 6 weeks    PT Treatment/Interventions ADLs/Self Care Home Management;Therapeutic activities;Therapeutic exercise;Manual techniques;Patient/family education;Manual lymph drainage;Passive range of motion    PT Next Visit Plan STW to right chest wall, Pecs,axillary region, scapular region, Right forearm and lateral epicondyle, PROM shoulder,Lat stretch, progress to scapular strength, AAROM exs for home    PT Home Exercise Plan AA clasped hands flexion in supine, star gazer in supine    Consulted and Agree with Plan of Care Patient  Patient will benefit from skilled therapeutic intervention in order to improve the following deficits and impairments:  Decreased activity tolerance, Decreased knowledge of precautions, Decreased range of motion, Decreased strength, Postural dysfunction, Impaired UE functional use, Pain  Visit Diagnosis: Chest wall pain  Abnormal posture  Pain in right arm     Problem List Patient Active Problem List   Diagnosis Date Noted  . Sepsis due to urinary tract infection ([place]) 08/26/2019  . Acute pyelonephritis 08/26/2019  . Sepsis secondary to UTI ([place]) 05/08/2019  . Hyperlipidemia associated with type 2 diabetes mellitus ([place])   . History of breast cancer   . Hypertension   . Hypothyroidism   . Age-related osteoporosis without current pathological fracture 12/27/2016  . Genital herpes in women 12/20/2016  . Wrist pain, chronic, right 08/01/2016  . Genetic testing 04/27/2015  . Family history of colon cancer   . Family history of ovarian cancer   . Diabetes mellitus type II, non insulin dependent ([place]) 03/31/2015  . Malignant neoplasm of upper-inner quadrant of right breast in female, estrogen receptor positive ([place]) 08/26/2014  . Osteoporosis 08/10/2014  . Vaginal atrophy 04/27/2014  . Diabetes ([place]) 04/17/2011  . GERD 04/07/2008  . FATTY LIVER DISEASE 04/07/2008    Claris Pong 08/19/2020, 12:03 PM  [place], [place], [address] Phone: [phone]   Fax:  [phone]  Name: Halaina Vanduzer MRN: [id number] Date of Birth: [birth year]  Cheral Almas, PT 08/19/20 12:04 PM

## 2020-08-22 ENCOUNTER — Ambulatory Visit: Payer: Medicare Other

## 2020-08-22 ENCOUNTER — Other Ambulatory Visit: Payer: Self-pay

## 2020-08-22 DIAGNOSIS — R0789 Other chest pain: Secondary | ICD-10-CM

## 2020-08-22 DIAGNOSIS — M79601 Pain in right arm: Secondary | ICD-10-CM | POA: Diagnosis not present

## 2020-08-22 DIAGNOSIS — R293 Abnormal posture: Secondary | ICD-10-CM

## 2020-08-22 NOTE — Therapy (Signed)
[place], [place], [address] Phone: [phone]   Fax:  [phone]  Physical Therapy Treatment  Patient Details  Name: Destiny Moody MRN: [id number] Date of Birth: November 22, 1950 Referring Provider (PT): Dr. Jana Hakim   Encounter Date: 08/22/2020   PT End of Session - 08/22/20 1144    Visit Number 4    Number of Visits 13    Date for PT Re-Evaluation 09/14/20    PT Start Time 1100    PT Stop Time 1155    PT Time Calculation (min) 55 min    Activity Tolerance Patient tolerated treatment well    Behavior During Therapy [facility] for tasks assessed/performed           Past Medical History:  Diagnosis Date  . Allergy   . Arthritis   . Breast cancer ([place]) 08/24/14   right, upper inner  . Cancer ([place])   . Chronic kidney disease   . Depression    no meds  . HSV (herpes simplex virus) anogenital infection 2018  . Hypercholesterolemia   . Hypertension   . Hypothyroidism   . Multinodular thyroid   . NIDDM (non-insulin dependent diabetes mellitus)   . Osteoporosis 2020   T score -2.5 stable from prior DEXA  . Radiation 01/31/15-03/01/15   right  breast  . Rheumatoid arthritis(714.0)    lower back  . Wears contact lenses     Past Surgical History:  Procedure Laterality Date  . BREAST LUMPECTOMY Right 2016  . BREAST SURGERY     Lumpectomy  . CESAREAN SECTION  1962,[id number]   x 2  . CHOLECYSTECTOMY  1993  . COLONOSCOPY  last 06/15/2016   greater than 12 years but not sure where colonoscopy was performed  . INCISION AND DRAINAGE ABSCESS Right 11/24/2014   Procedure: INCISION AND DRAINAGE RIGHT AXILLA  ABSCESS;  Surgeon: Fanny Skates, MD;  Location: [place];  Service: General;  Laterality: Right;  . RADIOACTIVE SEED GUIDED PARTIAL MASTECTOMY WITH AXILLARY SENTINEL LYMPH NODE BIOPSY Right 10/12/2014   Procedure: RIGHT  PARTIAL MASTECTOMY WITH RADIOACTIVE SEED LOCALIZATION  RIGHT  AXILLARY SENTINEL  NODE BIOPSY;   Surgeon: Fanny Skates, MD;  Location: [place];  Service: General;  Laterality: Right;  . TUBAL LIGATION  1996   re annastomosis  . WISDOM TOOTH EXTRACTION      There were no vitals filed for this visit.   Subjective Assessment - 08/22/20 1059    Subjective I was sore afterwards but when I rest it goes away.  Took tylenol this morning so don't have too much pain    Pertinent History Pt had a right breast lumpectomy on 10/12/2014 followed by radiation.  She had an I and D for abcess on 11/24/2014 She has recently completed her 5 years of tamoxifen.    Limitations House hold activities    Diagnostic tests mammogram, [place] negative,  CT negative    Patient Stated Goals Be able to use arm normally, reach better, do household chores like vacuuming, Driving    Currently in Pain? Yes    Pain Score 5     Pain Location Axilla    Pain Orientation Right                       [facility]  Lymphedema Life Impact Scale Total Score 51.47 %  [facility] Adult PT Treatment/Exercise - 08/22/20 0001      Shoulder Exercises: Standing   Retraction Strengthening;Left;10 reps    Theraband Level (Shoulder Retraction) Level 1 (Yellow)    Retraction Limitations --   required multiple VC's and TCs to do properly     Shoulder Exercises: Pulleys   Flexion 1 minute    ABduction 1 minute      Manual Therapy   Soft tissue mobilization STW to right pectorals, axillary area and scapular region, and Right UE wrist to axilla to decrease tissue tension/soreness done with coconut oil.in supine and SL  Large nodular area noted at lateral breast that does not seem to change.    Myofascial Release MFR to lateral trunk, axilla area during ROM    Passive ROM Passive ROM right shoulder with VC's to pt to relax.                       PT Long Term Goals - 08/03/20 1234      PT LONG TERM GOAL #1    Title Pt will be independant and compliant with HEP for right shoulder ROM/strength    Time 3    Period Weeks    Status New    Target Date 08/24/20      PT LONG TERM GOAL #2   Title Pt will report decreased pain/tenderness by greater than 50 % in arm and chest wall    Time 6    Period Weeks    Status New      PT LONG TERM GOAL #3   Title Pt will have right shoulder AROM equal to left for improved function    Time 6    Period Weeks    Status New      PT LONG TERM GOAL #4   Title Pt will be able to drive without increased pain    Time 4    Period Weeks    Status New      PT LONG TERM GOAL #5   Title Pt will be more tolerable of home activities    Time 6    Period Weeks    Status New                 Plan - 08/22/20 1157    Clinical Impression Statement Tratment consisted of STW to right arm from wrist to shoulder, lats axilla area and med and lateral scapular border,  MFR techniques to axilla and lats during PROM, and PROM to right shoulder.  Pt still requires multiple VC's to relax.  She had increased shoulder pain when lowering from pulleys today and we discontinued.  We did Scap retraction with and without theraband and she had difficulty performing exs correctly and had increased pain.  We stopped and reviewed again and used tactile cues to correct form and she was able to do without increased pain.  She was advised not to do at home until she can be monitored again.  Increased tissue tension was found throughout right UE and good softening was noted especially at anterior and lateral upper arm.    Personal Factors and Comorbidities Comorbidity 2    Comorbidities s/p right lumpectomy with radiation, diabetes    Examination-Activity Limitations Reach Overhead;Other    Examination-Participation Restrictions Cleaning;Driving    Stability/Clinical Decision Making Stable/Uncomplicated    Clinical Decision Making Low    Rehab Potential Excellent    PT Frequency 2x / week  PT Duration 6 weeks    PT Treatment/Interventions ADLs/Self Care Home Management;Therapeutic activities;Therapeutic exercise;Manual techniques;Patient/family education;Manual lymph drainage;Passive range of motion    PT Next Visit Plan STW to right chest wall, Pecs,axillary region, scapular region, Right forearm and lateral epicondyle, PROM shoulder,Lat stretch, progress to scapular strength, AAROM exs for home    PT Home Exercise Plan AA clasped hands flexion in supine, star gazer in supine    Consulted and Agree with Plan of Care Patient           Patient will benefit from skilled therapeutic intervention in order to improve the following deficits and impairments:  Decreased activity tolerance, Decreased knowledge of precautions, Decreased range of motion, Decreased strength, Postural dysfunction, Impaired UE functional use, Pain  Visit Diagnosis: Chest wall pain  Abnormal posture  Pain in right arm     Problem List Patient Active Problem List   Diagnosis Date Noted  . Sepsis due to urinary tract infection ([place]) 08/26/2019  . Acute pyelonephritis 08/26/2019  . Sepsis secondary to UTI ([place]) 05/08/2019  . Hyperlipidemia associated with type 2 diabetes mellitus ([place])   . History of breast cancer   . Hypertension   . Hypothyroidism   . Age-related osteoporosis without current pathological fracture 12/27/2016  . Genital herpes in women 12/20/2016  . Wrist pain, chronic, right 08/01/2016  . Genetic testing 04/27/2015  . Family history of colon cancer   . Family history of ovarian cancer   . Diabetes mellitus type II, non insulin dependent ([place]) 03/31/2015  . Malignant neoplasm of upper-inner quadrant of right breast in female, estrogen receptor positive ([place]) 08/26/2014  . Osteoporosis 08/10/2014  . Vaginal atrophy 04/27/2014  . Diabetes ([place]) 04/17/2011  . GERD 04/07/2008  . FATTY LIVER DISEASE 04/07/2008    Claris Pong 08/22/2020, 12:07 PM  [place], [place], [address] Phone: [phone]   Fax:  [phone]  Name: Destiny Moody MRN: [id number] Date of Birth: December 20, 1950  Cheral Almas, PT 08/22/20 12:08 PM

## 2020-08-23 ENCOUNTER — Ambulatory Visit (HOSPITAL_COMMUNITY)
Admission: RE | Admit: 2020-08-23 | Discharge: 2020-08-23 | Disposition: A | Discharge status: Home / Self Care | Payer: Medicare Other | Source: Ambulatory Visit | Attending: Oncology | Admitting: Oncology

## 2020-08-23 DIAGNOSIS — C50211 Malignant neoplasm of upper-inner quadrant of right female breast: Secondary | ICD-10-CM | POA: Diagnosis not present

## 2020-08-23 DIAGNOSIS — Z170 Estrogen receptor positive status [ER+]: Secondary | ICD-10-CM

## 2020-08-24 ENCOUNTER — Other Ambulatory Visit: Payer: Self-pay

## 2020-08-24 ENCOUNTER — Ambulatory Visit: Payer: Medicare Other

## 2020-08-24 DIAGNOSIS — M79601 Pain in right arm: Secondary | ICD-10-CM

## 2020-08-24 DIAGNOSIS — R293 Abnormal posture: Secondary | ICD-10-CM

## 2020-08-24 DIAGNOSIS — R0789 Other chest pain: Secondary | ICD-10-CM

## 2020-08-24 NOTE — Therapy (Signed)
[place], [place], [address] Phone: [phone]   Fax:  [phone]  Physical Therapy Evaluation  Patient Details  Name: Destiny Moody MRN: [id number] Date of Birth: 12/13/1950 Referring Provider (PT): Dr. Jana Hakim   Encounter Date: 08/24/2020   PT End of Session - 08/24/20 1148    Visit Number 5    Number of Visits 13    Date for PT Re-Evaluation 09/14/20    PT Start Time 1100    PT Stop Time 1146    PT Time Calculation (min) 46 min    Activity Tolerance Patient tolerated treatment well    Behavior During Therapy [facility] for tasks assessed/performed           Past Medical History:  Diagnosis Date  . Allergy   . Arthritis   . Breast cancer ([place]) 08/24/14   right, upper inner  . Cancer ([place])   . Chronic kidney disease   . Depression    no meds  . HSV (herpes simplex virus) anogenital infection 2018  . Hypercholesterolemia   . Hypertension   . Hypothyroidism   . Multinodular thyroid   . NIDDM (non-insulin dependent diabetes mellitus)   . Osteoporosis 2020   T score -2.5 stable from prior DEXA  . Radiation 01/31/15-03/01/15   right  breast  . Rheumatoid arthritis(714.0)    lower back  . Wears contact lenses     Past Surgical History:  Procedure Laterality Date  . BREAST LUMPECTOMY Right 2016  . BREAST SURGERY     Lumpectomy  . CESAREAN SECTION  0315,[id number]   x 2  . CHOLECYSTECTOMY  1993  . COLONOSCOPY  last 06/15/2016   greater than 12 years but not sure where colonoscopy was performed  . INCISION AND DRAINAGE ABSCESS Right 11/24/2014   Procedure: INCISION AND DRAINAGE RIGHT AXILLA  ABSCESS;  Surgeon: Fanny Skates, MD;  Location: [place];  Service: General;  Laterality: Right;  . RADIOACTIVE SEED GUIDED PARTIAL MASTECTOMY WITH AXILLARY SENTINEL LYMPH NODE BIOPSY Right 10/12/2014   Procedure: RIGHT  PARTIAL MASTECTOMY WITH RADIOACTIVE SEED LOCALIZATION  RIGHT  AXILLARY SENTINEL  NODE BIOPSY;   Surgeon: Fanny Skates, MD;  Location: [place];  Service: General;  Laterality: Right;  . TUBAL LIGATION  1996   re annastomosis  . WISDOM TOOTH EXTRACTION      There were no vitals filed for this visit.    Subjective Assessment - 08/24/20 1100    Subjective Arm is fine.  No pain today. Its worse at night. I was very sore Monday after therapy but fine yesterday.If I don't put alot of pressure into my hand its OK    Pertinent History Pt had a right breast lumpectomy on 10/12/2014 followed by radiation.  She had an I and D for abcess on 11/24/2014 She has recently completed her 5 years of tamoxifen.    Limitations House hold activities    Currently in Pain? No/denies    Pain Score 0-No pain                          Outpatient Rehab from 08/03/2020 in [place]  Lymphedema Life Impact Scale Total Score 51.47 %      Objective measurements completed on examination: See above findings.       [facility] Adult PT Treatment/Exercise - 08/24/20 0001      Shoulder Exercises: Standing   External Rotation Both;10 reps  ROM blocked by PT   Theraband Level (Shoulder External Rotation) Level 1 (Yellow)    Extension Both;10 reps    Theraband Level (Shoulder Extension) Level 1 (Yellow)    Retraction Strengthening;Left;10 reps    Theraband Level (Shoulder Retraction) Level 1 (Yellow)   requires VC and TC tp do properly     Manual Therapy   Soft tissue mobilization STW to right pectorals, axillary area and scapular region, and Right UE wrist to axilla to decrease tissue tension/soreness done with coconut oil.in supine and SL  Large nodular area noted at lateral breast that does not seem to change.    Myofascial Release MFR to lateral trunk, axilla area during ROM    Passive ROM Passive ROM right shoulder with VC's to pt to relax.                       PT Long Term Goals - 08/03/20 1234      PT LONG TERM GOAL #1    Title Pt will be independant and compliant with HEP for right shoulder ROM/strength    Time 3    Period Weeks    Status New    Target Date 08/24/20      PT LONG TERM GOAL #2   Title Pt will report decreased pain/tenderness by greater than 50 % in arm and chest wall    Time 6    Period Weeks    Status New      PT LONG TERM GOAL #3   Title Pt will have right shoulder AROM equal to left for improved function    Time 6    Period Weeks    Status New      PT LONG TERM GOAL #4   Title Pt will be able to drive without increased pain    Time 4    Period Weeks    Status New      PT LONG TERM GOAL #5   Title Pt will be more tolerable of home activities    Time 6    Period Weeks    Status New                  Plan - 08/24/20 1150    Clinical Impression Statement Pt consisted of Soft tissue work to Right UE, lats, pecs, lateral border of scapual, MFR techniques and exercises.  Pt has great difficulty return demonstrating exs and requires multiple tactile and verbal cues.  No new exercises were added secondary to pt requiring so much cueing.  Pt is tender over the supraspinatus insertion and may benefit from Ionto.    Personal Factors and Comorbidities Comorbidity 2    Comorbidities s/p right lumpectomy with radiation, diabetes    Examination-Activity Limitations Reach Overhead;Other    Examination-Participation Restrictions Cleaning;Driving    Stability/Clinical Decision Making Stable/Uncomplicated    Rehab Potential Excellent    PT Frequency 2x / week    PT Treatment/Interventions ADLs/Self Care Home Management;Therapeutic activities;Therapeutic exercise;Manual techniques;Patient/family education;Manual lymph drainage;Passive range of motion;Iontophoresis 4mg /ml Dexamethasone;Taping    PT Next Visit Plan Ionto if recert signed, Pecs,axillary region, scapular region, Right forearm and lateral epicondyle, PROM shoulder,Lat stretch, progress to scapular strength, AAROM exs for home     Consulted and Agree with Plan of Care Patient           Patient will benefit from skilled therapeutic intervention in order to improve the following deficits and impairments:  Decreased activity tolerance, Decreased  knowledge of precautions, Decreased range of motion, Decreased strength, Postural dysfunction, Impaired UE functional use, Pain  Visit Diagnosis: Chest wall pain  Abnormal posture  Pain in right arm     Problem List Patient Active Problem List   Diagnosis Date Noted  . Sepsis due to urinary tract infection ([place]) 08/26/2019  . Acute pyelonephritis 08/26/2019  . Sepsis secondary to UTI ([place]) 05/08/2019  . Hyperlipidemia associated with type 2 diabetes mellitus ([place])   . History of breast cancer   . Hypertension   . Hypothyroidism   . Age-related osteoporosis without current pathological fracture 12/27/2016  . Genital herpes in women 12/20/2016  . Wrist pain, chronic, right 08/01/2016  . Genetic testing 04/27/2015  . Family history of colon cancer   . Family history of ovarian cancer   . Diabetes mellitus type II, non insulin dependent ([place]) 03/31/2015  . Malignant neoplasm of upper-inner quadrant of right breast in female, estrogen receptor positive ([place]) 08/26/2014  . Osteoporosis 08/10/2014  . Vaginal atrophy 04/27/2014  . Diabetes ([place]) 04/17/2011  . GERD 04/07/2008  . FATTY LIVER DISEASE 04/07/2008    Claris Pong 08/24/2020, 11:59 AM  [place], [place], [address] Phone: [phone]   Fax:  [phone]  Name: Destiny Moody MRN: [id number] Date of Birth: 18-Feb-1951 Cheral Almas, PT 08/24/20 12:00 PM

## 2020-08-30 ENCOUNTER — Other Ambulatory Visit: Payer: Self-pay | Admitting: Family Medicine

## 2020-08-30 DIAGNOSIS — M79604 Pain in right leg: Secondary | ICD-10-CM

## 2020-08-31 ENCOUNTER — Ambulatory Visit: Payer: Medicare Other

## 2020-09-01 ENCOUNTER — Ambulatory Visit: Payer: Medicare Other

## 2020-09-01 ENCOUNTER — Other Ambulatory Visit: Payer: Self-pay

## 2020-09-01 DIAGNOSIS — M79601 Pain in right arm: Secondary | ICD-10-CM | POA: Diagnosis not present

## 2020-09-01 DIAGNOSIS — R293 Abnormal posture: Secondary | ICD-10-CM

## 2020-09-01 NOTE — Therapy (Signed)
[place], [place], [address] Phone: [phone]   Fax:  [phone]  Physical Therapy Treatment  Patient Details  Name: Destiny Moody MRN: [id number] Date of Birth: [birth year] Referring Provider (PT): Dr. Jana Hakim   Encounter Date: 09/01/2020   PT End of Session - 09/01/20 1157    Visit Number 6    Number of Visits 13    Date for PT Re-Evaluation 09/14/20    PT Start Time 1102    PT Stop Time 1155    PT Time Calculation (min) 53 min    Activity Tolerance Patient tolerated treatment well           Past Medical History:  Diagnosis Date  . Allergy   . Arthritis   . Breast cancer ([place]) 08/24/14   right, upper inner  . Cancer ([place])   . Chronic kidney disease   . Depression    no meds  . HSV (herpes simplex virus) anogenital infection 2018  . Hypercholesterolemia   . Hypertension   . Hypothyroidism   . Multinodular thyroid   . NIDDM (non-insulin dependent diabetes mellitus)   . Osteoporosis 2020   T score -2.5 stable from prior DEXA  . Radiation 01/31/15-03/01/15   right  breast  . Rheumatoid arthritis(714.0)    lower back  . Wears contact lenses     Past Surgical History:  Procedure Laterality Date  . BREAST LUMPECTOMY Right 2016  . BREAST SURGERY     Lumpectomy  . CESAREAN SECTION  [id number]   x 2  . CHOLECYSTECTOMY  1993  . COLONOSCOPY  last 06/15/2016   greater than 12 years but not sure where colonoscopy was performed  . INCISION AND DRAINAGE ABSCESS Right 11/24/2014   Procedure: INCISION AND DRAINAGE RIGHT AXILLA  ABSCESS;  Surgeon: Fanny Skates, MD;  Location: [place];  Service: General;  Laterality: Right;  . RADIOACTIVE SEED GUIDED PARTIAL MASTECTOMY WITH AXILLARY SENTINEL LYMPH NODE BIOPSY Right 10/12/2014   Procedure: RIGHT  PARTIAL MASTECTOMY WITH RADIOACTIVE SEED LOCALIZATION  RIGHT  AXILLARY SENTINEL  NODE BIOPSY;  Surgeon: Fanny Skates, MD;  Location: [place];  Service: General;  Laterality: Right;  . TUBAL LIGATION  1996   re annastomosis  . WISDOM TOOTH EXTRACTION      There were no vitals filed for this visit.   Subjective Assessment - 09/01/20 1101    Subjective Arm is the same.  My husband was rubbing my arm and he felt some little bumps.  It felt better after he did it.  Went to church one day and after walking alot my right leg and foot was really swollen. I saw the Doctor and he ordered US on Jan. 3.  the swelling and pain is better now. I knew the shoes were bothering me some. My arm seems to be improving until I do something heavy and then it hurts.    Pertinent History Pt had a right breast lumpectomy on 10/12/2014 followed by radiation.  She had an I and D for abcess on 11/24/2014 She has recently completed her 5 years of tamoxifen.    Currently in Pain? No/denies                     Flowsheet Row Outpatient [facility]  Lymphedema Life Impact Scale Total Score 51.47 %            [facility] Adult PT Treatment/Exercise -  09/01/20 0001      Shoulder Exercises: Standing   External Rotation Both;10 reps   ROM blocked by PT   Theraband Level (Shoulder External Rotation) Level 1 (Yellow)    Extension Right;10 reps    Theraband Level (Shoulder Extension) Level 1 (Yellow)    Retraction Strengthening;Left;10 reps    Theraband Level (Shoulder Retraction) Level 1 (Yellow)   requires VC and TC tp do properly     Iontophoresis   Type of Iontophoresis Dexamethasone    Location right prox. biceps    Dose 4 mg/ml    Time 4 hours      Manual Therapy   Soft tissue mobilization STW to right pectorals, axillary area and scapular region, and Right UE wrist to axilla to decrease tissue tension/soreness done with coconut oil.in supine and SL  Large nodular area noted at lateral breast that does not seem to change.    Myofascial Release MFR to lateral trunk, axilla area  during ROM    Passive ROM Passive ROM right shoulder with VC's to pt to relax.                       PT Long Term Goals - 08/03/20 1234      PT LONG TERM GOAL #1   Title Pt will be independant and compliant with HEP for right shoulder ROM/strength    Time 3    Period Weeks    Status New    Target Date 08/24/20      PT LONG TERM GOAL #2   Title Pt will report decreased pain/tenderness by greater than 50 % in arm and chest wall    Time 6    Period Weeks    Status New      PT LONG TERM GOAL #3   Title Pt will have right shoulder AROM equal to left for improved function    Time 6    Period Weeks    Status New      PT LONG TERM GOAL #4   Title Pt will be able to drive without increased pain    Time 4    Period Weeks    Status New      PT LONG TERM GOAL #5   Title Pt will be more tolerable of home activities    Time 6    Period Weeks    Status New                 Plan - 09/01/20 1158    Clinical Impression Statement Pt consisted of STW to Right UE pecs, lats, lat. border of scapula, UT and Right UE upper arm and forearm and, PROM and MFR techniques during PROM.  Also performed theraband exs in clinic with verbal and tactile cues still required.  initiated Iontophoresis with 4 mg/ml of dexamethasone to right proximal biceps with explanation to pt and pt to remove in 4 hours. increased tissue tension noted in upper biceps and mid delt today. Pt is still limited with lifting and heavier activities.    Personal Factors and Comorbidities Comorbidity 2    Comorbidities s/p right lumpectomy with radiation, diabetes    Examination-Activity Limitations Reach Overhead;Other    Examination-Participation Restrictions Cleaning;Driving    Stability/Clinical Decision Making Stable/Uncomplicated    Rehab Potential Good    PT Frequency 2x / week    PT Duration 6 weeks    PT Treatment/Interventions ADLs/Self Care Home Management;Therapeutic activities;Therapeutic  exercise;Manual techniques;Patient/family education;Manual  lymph drainage;Passive range of motion;Iontophoresis 4mg /ml Dexamethasone;Taping    PT Next Visit Plan Continue manual work, strength exs with VC and TC prn.  Would like to give for HEP but pt has trouble return demonstrating properly, continue Ionto    PT Home Exercise Plan AA clasped hands flexion in supine, star gazer in supine    Consulted and Agree with Plan of Care Patient           Patient will benefit from skilled therapeutic intervention in order to improve the following deficits and impairments:  Decreased activity tolerance,Decreased knowledge of precautions,Decreased range of motion,Decreased strength,Postural dysfunction,Impaired UE functional use,Pain  Visit Diagnosis: Abnormal posture  Pain in right arm     Problem List Patient Active Problem List   Diagnosis Date Noted  . Sepsis due to urinary tract infection ([place]) 08/26/2019  . Acute pyelonephritis 08/26/2019  . Sepsis secondary to UTI ([place]) 05/08/2019  . Hyperlipidemia associated with type 2 diabetes mellitus ([place])   . History of breast cancer   . Hypertension   . Hypothyroidism   . Age-related osteoporosis without current pathological fracture 12/27/2016  . Genital herpes in women 12/20/2016  . Wrist pain, chronic, right 08/01/2016  . Genetic testing 04/27/2015  . Family history of colon cancer   . Family history of ovarian cancer   . Diabetes mellitus type II, non insulin dependent ([place]) 03/31/2015  . Malignant neoplasm of upper-inner quadrant of right breast in female, estrogen receptor positive ([place]) 08/26/2014  . Osteoporosis 08/10/2014  . Vaginal atrophy 04/27/2014  . Diabetes ([place]) 04/17/2011  . GERD 04/07/2008  . FATTY LIVER DISEASE 04/07/2008    Claris Pong 09/01/2020, 12:04 PM  [place], [place], [address] Phone: [phone]   Fax:   [phone]  Name: Destiny Moody MRN: [id number] Date of Birth: 03/19/1951  Cheral Almas, PT 09/01/20 12:05 PM

## 2020-09-05 ENCOUNTER — Other Ambulatory Visit: Payer: Self-pay

## 2020-09-05 ENCOUNTER — Ambulatory Visit: Payer: Medicare Other

## 2020-09-05 DIAGNOSIS — M79601 Pain in right arm: Secondary | ICD-10-CM

## 2020-09-05 DIAGNOSIS — R293 Abnormal posture: Secondary | ICD-10-CM

## 2020-09-05 DIAGNOSIS — R0789 Other chest pain: Secondary | ICD-10-CM

## 2020-09-05 NOTE — Therapy (Signed)
[place], [place], [address] Phone: [phone]   Fax:  [phone]  Physical Therapy Treatment  Patient Details  Name: Destiny Moody MRN: [id number] Date of Birth: 1950-11-29 Referring Provider (PT): Dr. Jana Hakim   Encounter Date: 09/05/2020   PT End of Session - 09/05/20 1203    Visit Number 7    Number of Visits 13    Date for PT Re-Evaluation 09/14/20    PT Start Time 1118    PT Stop Time 1200    PT Time Calculation (min) 42 min    Activity Tolerance Patient tolerated treatment well    Behavior During Therapy [facility] for tasks assessed/performed           Past Medical History:  Diagnosis Date  . Allergy   . Arthritis   . Breast cancer ([place]) 08/24/14   right, upper inner  . Cancer ([place])   . Chronic kidney disease   . Depression    no meds  . HSV (herpes simplex virus) anogenital infection 2018  . Hypercholesterolemia   . Hypertension   . Hypothyroidism   . Multinodular thyroid   . NIDDM (non-insulin dependent diabetes mellitus)   . Osteoporosis 2020   T score -2.5 stable from prior DEXA  . Radiation 01/31/15-03/01/15   right  breast  . Rheumatoid arthritis(714.0)    lower back  . Wears contact lenses     Past Surgical History:  Procedure Laterality Date  . BREAST LUMPECTOMY Right 2016  . BREAST SURGERY     Lumpectomy  . CESAREAN SECTION  [id number]   x 2  . CHOLECYSTECTOMY  1993  . COLONOSCOPY  last 06/15/2016   greater than 12 years but not sure where colonoscopy was performed  . INCISION AND DRAINAGE ABSCESS Right 11/24/2014   Procedure: INCISION AND DRAINAGE RIGHT AXILLA  ABSCESS;  Surgeon: Fanny Skates, MD;  Location: [place];  Service: General;  Laterality: Right;  . RADIOACTIVE SEED GUIDED PARTIAL MASTECTOMY WITH AXILLARY SENTINEL LYMPH NODE BIOPSY Right 10/12/2014   Procedure: RIGHT  PARTIAL MASTECTOMY WITH RADIOACTIVE SEED LOCALIZATION  RIGHT  AXILLARY SENTINEL  NODE BIOPSY;   Surgeon: Fanny Skates, MD;  Location: [place];  Service: General;  Laterality: Right;  . TUBAL LIGATION  1996   re annastomosis  . WISDOM TOOTH EXTRACTION      There were no vitals filed for this visit.   Subjective Assessment - 09/05/20 1120    Subjective The patch seemed to help my shoulder last time.  I still feel the bump at my lateral breast and it feels like there is another one there too.  No shoulder pain presently.  My shoulder is overall better because it used to hurt all the time now it only hurts if I put wt into it. Elbow is still hurting on both sides    Pertinent History Pt had a right breast lumpectomy on 10/12/2014 followed by radiation.  She had an I and D for abcess on 11/24/2014 She has recently completed her 5 years of tamoxifen.    Limitations House hold activities    Diagnostic tests mammogram, [place] negative,  CT negative    Patient Stated Goals Be able to use arm normally, reach better, do household chores like vacuuming, Driving                     Flowsheet Row Outpatient Rehab from 08/03/2020 in [place]  Impact Scale Total Score 51.47 %            [facility] Adult PT Treatment/Exercise - 09/05/20 0001      Shoulder Exercises: Standing   External Rotation Both;10 reps   ROM blocked by PT   Theraband Level (Shoulder External Rotation) Level 1 (Yellow)    Extension Right;10 reps    Theraband Level (Shoulder Extension) Level 1 (Yellow)    Retraction Strengthening;Left;10 reps    Theraband Level (Shoulder Retraction) Level 1 (Yellow)   requires VC and TC tp do properly     Shoulder Exercises: Pulleys   Flexion 1 minute    ABduction 1 minute      Iontophoresis   Type of Iontophoresis Dexamethasone    Location right prox. biceps    Dose 4 mg/ml    Time 4 hours      Manual Therapy   Soft tissue mobilization STW to right pectorals, axillary area and scapular region, and Right  UE wrist to axilla to decrease tissue tension/soreness done with coconut oil.in supine and SL  Large nodular area noted at lateral breast that does not seem to change.    Myofascial Release MFR to lateral trunk, axilla area during ROM    Passive ROM Passive ROM right shoulder with VC's to pt to relax.                       PT Long Term Goals - 08/03/20 1234      PT LONG TERM GOAL #1   Title Pt will be independant and compliant with HEP for right shoulder ROM/strength    Time 3    Period Weeks    Status New    Target Date 08/24/20      PT LONG TERM GOAL #2   Title Pt will report decreased pain/tenderness by greater than 50 % in arm and chest wall    Time 6    Period Weeks    Status New      PT LONG TERM GOAL #3   Title Pt will have right shoulder AROM equal to left for improved function    Time 6    Period Weeks    Status New      PT LONG TERM GOAL #4   Title Pt will be able to drive without increased pain    Time 4    Period Weeks    Status New      PT LONG TERM GOAL #5   Title Pt will be more tolerable of home activities    Time 6    Period Weeks    Status New                 Plan - 09/05/20 1204    Clinical Impression Statement Pts shoulder pain is overall improved as pain use to be constant and now it is mainly with putting wt into her hand.  She is still complaining of med and lateral elbow pain with tenderness and has increased tissue tension at proximal asocet of biceps, and extensior forearm.  She had good tolerance for Ionto last time and it was repeated again today.  She used better form with TB exs today and pulleys, but had mild elbow pain nearing the end of her 10 reps of scapular retraction    Personal Factors and Comorbidities Comorbidity 2    Comorbidities s/p right lumpectomy with radiation, diabetes    Examination-Activity Limitations Reach Overhead;Other  Examination-Participation Restrictions Cleaning;Driving     Stability/Clinical Decision Making Stable/Uncomplicated    Clinical Decision Making Low    Rehab Potential Good    PT Frequency 2x / week    PT Duration 6 weeks    PT Treatment/Interventions ADLs/Self Care Home Management;Therapeutic activities;Therapeutic exercise;Manual techniques;Patient/family education;Manual lymph drainage;Passive range of motion;Iontophoresis 4mg /ml Dexamethasone;Taping    PT Next Visit Plan Continue manual work, strength exs with VC and TC prn.  Would like to give for HEP but pt has trouble return demonstrating properly, continue Ionto           Patient will benefit from skilled therapeutic intervention in order to improve the following deficits and impairments:  Decreased activity tolerance,Decreased knowledge of precautions,Decreased range of motion,Decreased strength,Postural dysfunction,Impaired UE functional use,Pain  Visit Diagnosis: Abnormal posture  Pain in right arm  Chest wall pain     Problem List Patient Active Problem List   Diagnosis Date Noted  . Sepsis due to urinary tract infection ([place]) 08/26/2019  . Acute pyelonephritis 08/26/2019  . Sepsis secondary to UTI ([place]) 05/08/2019  . Hyperlipidemia associated with type 2 diabetes mellitus ([place])   . History of breast cancer   . Hypertension   . Hypothyroidism   . Age-related osteoporosis without current pathological fracture 12/27/2016  . Genital herpes in women 12/20/2016  . Wrist pain, chronic, right 08/01/2016  . Genetic testing 04/27/2015  . Family history of colon cancer   . Family history of ovarian cancer   . Diabetes mellitus type II, non insulin dependent ([place]) 03/31/2015  . Malignant neoplasm of upper-inner quadrant of right breast in female, estrogen receptor positive ([place]) 08/26/2014  . Osteoporosis 08/10/2014  . Vaginal atrophy 04/27/2014  . Diabetes ([place]) 04/17/2011  . GERD 04/07/2008  . FATTY LIVER DISEASE 04/07/2008    Claris Pong 09/05/2020, 12:09 PM  [place], [place], [address] Phone: [phone]   Fax:  [phone]  Name: Destiny Moody MRN: [id number] Date of Birth: 1950/11/17 Cheral Almas, PT 09/05/20 12:09 PM

## 2020-09-08 ENCOUNTER — Other Ambulatory Visit: Payer: Self-pay

## 2020-09-08 ENCOUNTER — Ambulatory Visit: Payer: Medicare Other

## 2020-09-08 DIAGNOSIS — M79601 Pain in right arm: Secondary | ICD-10-CM | POA: Diagnosis not present

## 2020-09-08 DIAGNOSIS — R0789 Other chest pain: Secondary | ICD-10-CM

## 2020-09-08 DIAGNOSIS — R293 Abnormal posture: Secondary | ICD-10-CM

## 2020-09-08 NOTE — Therapy (Signed)
[place], [place], [address] Phone: [phone]   Fax:  [phone]  Physical Therapy Treatment  Patient Details  Name: Destiny Moody MRN: [id number] Date of Birth: 1950-09-29 Referring Provider (PT): Dr. Jana Hakim   Encounter Date: 09/08/2020   PT End of Session - 09/08/20 1203    Visit Number 8    Number of Visits 13    Date for PT Re-Evaluation 09/14/20    PT Start Time 1108    PT Stop Time 1201    PT Time Calculation (min) 53 min    Activity Tolerance Patient tolerated treatment well    Behavior During Therapy [facility] for tasks assessed/performed           Past Medical History:  Diagnosis Date  . Allergy   . Arthritis   . Breast cancer ([place]) 08/24/14   right, upper inner  . Cancer ([place])   . Chronic kidney disease   . Depression    no meds  . HSV (herpes simplex virus) anogenital infection 2018  . Hypercholesterolemia   . Hypertension   . Hypothyroidism   . Multinodular thyroid   . NIDDM (non-insulin dependent diabetes mellitus)   . Osteoporosis 2020   T score -2.5 stable from prior DEXA  . Radiation 01/31/15-03/01/15   right  breast  . Rheumatoid arthritis(714.0)    lower back  . Wears contact lenses     Past Surgical History:  Procedure Laterality Date  . BREAST LUMPECTOMY Right 2016  . BREAST SURGERY     Lumpectomy  . CESAREAN SECTION  4627,0350   x 2  . CHOLECYSTECTOMY  1993  . COLONOSCOPY  last 06/15/2016   greater than 12 years but not sure where colonoscopy was performed  . INCISION AND DRAINAGE ABSCESS Right 11/24/2014   Procedure: INCISION AND DRAINAGE RIGHT AXILLA  ABSCESS;  Surgeon: Fanny Skates, MD;  Location: [place];  Service: General;  Laterality: Right;  . RADIOACTIVE SEED GUIDED PARTIAL MASTECTOMY WITH AXILLARY SENTINEL LYMPH NODE BIOPSY Right 10/12/2014   Procedure: RIGHT  PARTIAL MASTECTOMY WITH RADIOACTIVE SEED LOCALIZATION  RIGHT  AXILLARY SENTINEL  NODE BIOPSY;   Surgeon: Fanny Skates, MD;  Location: [place];  Service: General;  Laterality: Right;  . TUBAL LIGATION  1996   re annastomosis  . WISDOM TOOTH EXTRACTION      There were no vitals filed for this visit.   Subjective Assessment - 09/08/20 1112    Subjective The patch stayed on well.  My shoulder feels better with the patch on but when I remove it the pain starts hurting, but she has been more active also cleaning.  She washed windows and vacuumed. pain around my arm/axilla is better, but my forearm has a lot of "bubbles in it. Sometimes use a topical rub on medial forearm. My right elbow is still very sore.    Pertinent History Pt had a right breast lumpectomy on 10/12/2014 followed by radiation.  She had an I and D for abcess on 11/24/2014 She has recently completed her 5 years of tamoxifen.    Limitations House hold activities    Diagnostic tests mammogram, [place] negative,  CT negative    Currently in Pain? Yes    Pain Score 7     Pain Location Elbow    Pain Orientation Right    Pain Onset More than a month ago    Pain Frequency Intermittent    Effect of Pain on Daily Activities  disturbed sleep, difficulty driving                     Flowsheet Row [facility]patient [facility]  Lymphedema Life Impact Scale Total Score 51.47 %            [facility] Adult PT Treatment/Exercise - 09/08/20 0001      Shoulder Exercises: Supine   Other Supine Exercises on half foam roll flex, scaption, horizontal abd x 5      Iontophoresis   Type of Iontophoresis Dexamethasone    Location right prox. biceps    Dose 4 mg/ml    Time 4 hours      Manual Therapy   Soft tissue mobilization STW to right pectorals, axillary area and scapular region, and Right UE wrist to axilla to decrease tissue tension/soreness done with coconut oil.in supine and SL  Large nodular area noted at lateral breast that does not seem to change.     Myofascial Release MFR to lateral trunk, axilla area during ROM    Passive ROM Passive ROM right shoulder with VC's to pt to relax.                       PT Long Term Goals - 08/03/20 1234      PT LONG TERM GOAL #1   Title Pt will be independant and compliant with HEP for right shoulder ROM/strength    Time 3    Period Weeks    Status New    Target Date 08/24/20      PT LONG TERM GOAL #2   Title Pt will report decreased pain/tenderness by greater than 50 % in arm and chest wall    Time 6    Period Weeks    Status New      PT LONG TERM GOAL #3   Title Pt will have right shoulder AROM equal to left for improved function    Time 6    Period Weeks    Status New      PT LONG TERM GOAL #4   Title Pt will be able to drive without increased pain    Time 4    Period Weeks    Status New      PT LONG TERM GOAL #5   Title Pt will be more tolerable of home activities    Time 6    Period Weeks    Status New                 Plan - 09/08/20 1204    Clinical Impression Statement Pt reports good relief of shoulder pain during [place] patch but reports pain returns as soon as she takes it off. Biggest complaint now is med and lateral elbow pain.  Pain around the right axilla is pretty much gone.  She does still have increased tissue tension throughout the right upper arm and forearm and tenderness at prox biceps and med/lateral epicondyle.  Exercises need to be monitored for form.    Personal Factors and Comorbidities Comorbidity 2    Comorbidities s/p right lumpectomy with radiation, diabetes    Examination-Activity Limitations Reach Overhead;Other;Sleep    Examination-Participation Restrictions Cleaning;Driving    Stability/Clinical Decision Making Stable/Uncomplicated    Rehab Potential Good    PT Frequency 2x / week    PT Duration 6 weeks    PT Treatment/Interventions ADLs/Self Care Home Management;Therapeutic activities;Therapeutic exercise;Manual  techniques;Patient/family education;Manual lymph  drainage;Passive range of motion;Iontophoresis 4mg /ml Dexamethasone;Taping    PT Next Visit Plan recert next week, continue manual work, strength exs, Ionto?    Consulted and Agree with Plan of Care Patient           Patient will benefit from skilled therapeutic intervention in order to improve the following deficits and impairments:  Decreased activity tolerance,Decreased knowledge of precautions,Decreased range of motion,Decreased strength,Postural dysfunction,Impaired UE functional use,Pain  Visit Diagnosis: Abnormal posture  Pain in right arm  Chest wall pain     Problem List Patient Active Problem List   Diagnosis Date Noted  . Sepsis due to urinary tract infection ([place]) 08/26/2019  . Acute pyelonephritis 08/26/2019  . Sepsis secondary to UTI ([place]) 05/08/2019  . Hyperlipidemia associated with type 2 diabetes mellitus ([place])   . History of breast cancer   . Hypertension   . Hypothyroidism   . Age-related osteoporosis without current pathological fracture 12/27/2016  . Genital herpes in women 12/20/2016  . Wrist pain, chronic, right 08/01/2016  . Genetic testing 04/27/2015  . Family history of colon cancer   . Family history of ovarian cancer   . Diabetes mellitus type II, non insulin dependent ([place]) 03/31/2015  . Malignant neoplasm of upper-inner quadrant of right breast in female, estrogen receptor positive ([place]) 08/26/2014  . Osteoporosis 08/10/2014  . Vaginal atrophy 04/27/2014  . Diabetes ([place]) 04/17/2011  . GERD 04/07/2008  . FATTY LIVER DISEASE 04/07/2008    Claris Pong 09/08/2020, 12:08 PM  [place], [place], [address] Phone: [phone]   Fax:  [phone]  Name: Destiny Moody MRN: [id number] Date of Birth: April 30, 1951  Cheral Almas, PT 09/08/20 12:09 PM

## 2020-09-14 ENCOUNTER — Other Ambulatory Visit: Payer: Self-pay

## 2020-09-14 ENCOUNTER — Ambulatory Visit: Payer: Medicare Other

## 2020-09-14 DIAGNOSIS — M79601 Pain in right arm: Secondary | ICD-10-CM | POA: Diagnosis not present

## 2020-09-14 DIAGNOSIS — R293 Abnormal posture: Secondary | ICD-10-CM

## 2020-09-14 DIAGNOSIS — R0789 Other chest pain: Secondary | ICD-10-CM

## 2020-09-14 NOTE — Therapy (Signed)
[place], [place], [address] Phone: [phone]   Fax:  [phone]  Physical Therapy Treatment  Patient Details  Name: Destiny Moody MRN: [id number] Date of Birth: 04/10/1951 Referring Provider (PT): Dr. Jana Hakim   Encounter Date: 09/14/2020   PT End of Session - 09/14/20 1155    Visit Number 9    Number of Visits 13    Date for PT Re-Evaluation 09/14/20    PT Start Time 1105    PT Stop Time 1147    PT Time Calculation (min) 42 min    Activity Tolerance Patient tolerated treatment well    Behavior During Therapy [facility] for tasks assessed/performed           Past Medical History:  Diagnosis Date  . Allergy   . Arthritis   . Breast cancer ([place]) 08/24/14   right, upper inner  . Cancer ([place])   . Chronic kidney disease   . Depression    no meds  . HSV (herpes simplex virus) anogenital infection 2018  . Hypercholesterolemia   . Hypertension   . Hypothyroidism   . Multinodular thyroid   . NIDDM (non-insulin dependent diabetes mellitus)   . Osteoporosis 2020   T score -2.5 stable from prior DEXA  . Radiation 01/31/15-03/01/15   right  breast  . Rheumatoid arthritis(714.0)    lower back  . Wears contact lenses     Past Surgical History:  Procedure Laterality Date  . BREAST LUMPECTOMY Right 2016  . BREAST SURGERY     Lumpectomy  . CESAREAN SECTION  8527,7824   x 2  . CHOLECYSTECTOMY  1993  . COLONOSCOPY  last 06/15/2016   greater than 12 years but not sure where colonoscopy was performed  . INCISION AND DRAINAGE ABSCESS Right 11/24/2014   Procedure: INCISION AND DRAINAGE RIGHT AXILLA  ABSCESS;  Surgeon: Fanny Skates, MD;  Location: [facility];  Service: General;  Laterality: Right;  . RADIOACTIVE SEED GUIDED PARTIAL MASTECTOMY WITH AXILLARY SENTINEL LYMPH NODE BIOPSY Right 10/12/2014   Procedure: RIGHT  PARTIAL MASTECTOMY WITH RADIOACTIVE SEED LOCALIZATION  RIGHT  AXILLARY SENTINEL  NODE BIOPSY;   Surgeon: Fanny Skates, MD;  Location: [facility] [place];  Service: General;  Laterality: Right;  . TUBAL LIGATION  1996   re annastomosis  . WISDOM TOOTH EXTRACTION      There were no vitals filed for this visit.   Subjective Assessment - 09/14/20 1151    Subjective The patch stayed on OK.  The axillary pain is much better but the shoulder and both sides of my elbow really hurt.  Muscles feel tight in forearm and upper arm. Still have the thickening at lateral breast on right that has been tested multiple times as negative.  Overall pain is much better.  Pain in right UE used to be constant but is now mainly with use.  I still have pain with housework and driving.    Pertinent History Pt had a right breast lumpectomy on 10/12/2014 followed by radiation.  She had an I and D for abcess on 11/24/2014 She has recently completed her 5 years of tamoxifen.    Limitations House hold activities    Diagnostic tests mammogram, [place] negative,  CT negative for lateral breast nodule    Patient Stated Goals Be able to use arm normally, reach better, do household chores like vacuuming, Driving              [facility] PT Assessment -  09/14/20 0001      Assessment   Medical Diagnosis Right shoulder/arm pain    Referring Provider (PT) Dr. Jana Hakim    Onset Date/Surgical Date 01/11/15    Hand Dominance Right      AROM   Right Shoulder Extension 56 Degrees    Right Shoulder Flexion 160 Degrees    Right Shoulder ABduction 170 Degrees    Right Shoulder Internal Rotation 63 Degrees    Right Shoulder External Rotation 100 Degrees             LYMPHEDEMA/ONCOLOGY QUESTIONNAIRE - 09/14/20 0001      Surgeries   Lumpectomy Date 01/11/20      Right Upper Extremity Lymphedema   At Axilla  30.8 cm    15 cm Proximal to Olecranon Process 30 cm    10 cm Proximal to Olecranon Process 28.2 cm    Olecranon Process 25 cm    15 cm Proximal to Ulnar Styloid Process 21.7 cm    10 cm Proximal to Ulnar  Styloid Process 19.2 cm    Just Proximal to Ulnar Styloid Process 17 cm    Across Hand at [employer] 19.7 cm    At [place] of 2nd Digit 6.7 cm              Flowsheet Row [facility]  Lymphedema Life Impact Scale Total Score 51.47 %            [facility] Adult PT Treatment/Exercise - 09/14/20 0001      Manual Therapy   Passive ROM Passive ROM right shoulder with VC's to pt to relax.                  PT Education - 09/14/20 1155    Education Details Pt. was advised to follow up with an orthopedic regarding shoulder and elbow pain that has been limiting driving and household chores.    Person(s) Educated Patient    Methods Explanation    Comprehension Verbalized understanding               PT Long Term Goals - 09/14/20 1201      PT LONG TERM GOAL #1   Title Pt will be independant and compliant with HEP for right shoulder ROM/strength    Time 3    Period Weeks      PT LONG TERM GOAL #2   Title Pt will report decreased pain/tenderness by greater than 50 % in arm and chest wall    Baseline was having constant pain, now only with activity    Time 6    Period Weeks    Status Achieved      PT LONG TERM GOAL #3   Title Pt will have right shoulder AROM equal to left for improved function    Time 6    Period Weeks    Status Partially Met      PT LONG TERM GOAL #4   Title Pt will be able to drive without increased pain    Time 4    Period Weeks    Status Not Met      PT LONG TERM GOAL #5   Title Pt will be more tolerable of home activities    Time 6    Period Weeks    Status Partially Met                 Plan - 09/14/20 1156  Clinical Impression Statement Pt has had good improvement in right shoulder ROM and pain is no longer constant, however pain does continue with driving and household activities.  She has been advised to see an orthopedic MD for her right shoulder  and elbow pain that continues to bother her.  She also has some other medical concerns to attend to.  She is being discharged to continue with her HEP and see orthopedic. She continues with tenderness at proximal biceps, medial and lateral epicondyle, and forearm musculature.    Personal Factors and Comorbidities Comorbidity 2    Comorbidities s/p right lumpectomy with radiation, diabetes    Examination-Activity Limitations Reach Overhead;Other;Sleep    Examination-Participation Restrictions Cleaning;Driving    Stability/Clinical Decision Making Stable/Uncomplicated    Rehab Potential Good    PT Frequency 2x / week    PT Duration 6 weeks    PT Treatment/Interventions ADLs/Self Care Home Management;Therapeutic activities;Therapeutic exercise;Manual techniques;Patient/family education;Manual lymph drainage;Passive range of motion;Iontophoresis 36m/ml Dexamethasone;Taping    PT Next Visit Plan NA    PT Home Exercise Plan AA clasped hands flexion in supine, star gazer in supine    Consulted and Agree with Plan of Care Patient           Patient will benefit from skilled therapeutic intervention in order to improve the following deficits and impairments:  Decreased activity tolerance,Decreased knowledge of precautions,Decreased range of motion,Decreased strength,Postural dysfunction,Impaired UE functional use,Pain  Visit Diagnosis: Abnormal posture  Pain in right arm  Chest wall pain     Problem List Patient Active Problem List   Diagnosis Date Noted  . [place] 08/26/2019  . Acute pyelonephritis 08/26/2019  . Sepsis secondary to UTI [place] 05/08/2019  . Hyperlipidemia associated with type 2 diabetes mellitus ([place]   . History of breast cancer   . Hypertension   . Hypothyroidism   . Age-related osteoporosis without current pathological fracture 12/27/2016  . Genital herpes in women 12/20/2016  . Wrist pain, chronic, right 08/01/2016  . Genetic  testing 04/27/2015  . Family history of colon cancer   . Family history of ovarian cancer   . Diabetes mellitus type II, non insulin dependent ([place] 03/31/2015  . Malignant neoplasm of upper-inner quadrant of right breast in female, estrogen receptor positive ([place] 08/26/2014  . Osteoporosis 08/10/2014  . Vaginal atrophy 04/27/2014  . Diabetes ([place] 04/17/2011  . GERD 04/07/2008  . FATTY LIVER DISEASE 04/07/2008   PHYSICAL THERAPY DISCHARGE SUMMARY  Visits from Start of Care: 9  Current functional level related to goals / functional outcomes:independent but requires modification with household chores:uses left arm more    Remaining deficits:Pain with driving, household chores   Education / Equipment: NA  Plan: Patient agrees to discharge.  Patient goals were not met. Patient is being discharged due to lack of progress.  ?????      RCheral Almas PT 09/14/20 12:08 PM   RClaris Pong12/29/2021, 12:03 PM  [place] [address]Phone: [phone]  Fax:  [phone] Name: EJenicka CoxeMRN: [id number] of Birth: [birth year]

## 2020-09-21 ENCOUNTER — Ambulatory Visit
Admission: RE | Admit: 2020-09-21 | Discharge: 2020-09-21 | Disposition: A | Discharge status: Home / Self Care | Payer: Medicare Other | Source: Ambulatory Visit | Attending: Family Medicine | Admitting: Family Medicine

## 2020-09-21 DIAGNOSIS — M79661 Pain in right lower leg: Secondary | ICD-10-CM | POA: Diagnosis not present

## 2020-09-21 DIAGNOSIS — M79604 Pain in right leg: Secondary | ICD-10-CM

## 2020-09-21 DIAGNOSIS — M7989 Other specified soft tissue disorders: Secondary | ICD-10-CM | POA: Diagnosis not present

## 2020-09-29 DIAGNOSIS — J069 Acute upper respiratory infection, unspecified: Secondary | ICD-10-CM | POA: Diagnosis not present

## 2020-10-06 ENCOUNTER — Encounter (HOSPITAL_COMMUNITY): Payer: Self-pay | Admitting: Emergency Medicine

## 2020-10-06 ENCOUNTER — Other Ambulatory Visit: Payer: Self-pay

## 2020-10-06 ENCOUNTER — Emergency Department (HOSPITAL_COMMUNITY)
Admission: EM | Admit: 2020-10-06 | Discharge: 2020-10-07 | Disposition: A | Discharge status: Home / Self Care | Payer: Medicare Other | Attending: Emergency Medicine | Admitting: Emergency Medicine

## 2020-10-06 ENCOUNTER — Emergency Department (HOSPITAL_COMMUNITY): Payer: Medicare Other

## 2020-10-06 DIAGNOSIS — I129 Hypertensive chronic kidney disease with stage 1 through stage 4 chronic kidney disease, or unspecified chronic kidney disease: Secondary | ICD-10-CM | POA: Diagnosis not present

## 2020-10-06 DIAGNOSIS — B349 Viral infection, unspecified: Secondary | ICD-10-CM | POA: Insufficient documentation

## 2020-10-06 DIAGNOSIS — N189 Chronic kidney disease, unspecified: Secondary | ICD-10-CM | POA: Diagnosis not present

## 2020-10-06 DIAGNOSIS — Z7982 Long term (current) use of aspirin: Secondary | ICD-10-CM | POA: Diagnosis not present

## 2020-10-06 DIAGNOSIS — Z7984 Long term (current) use of oral hypoglycemic drugs: Secondary | ICD-10-CM | POA: Diagnosis not present

## 2020-10-06 DIAGNOSIS — E1122 Type 2 diabetes mellitus with diabetic chronic kidney disease: Secondary | ICD-10-CM | POA: Insufficient documentation

## 2020-10-06 DIAGNOSIS — Z79899 Other long term (current) drug therapy: Secondary | ICD-10-CM | POA: Diagnosis not present

## 2020-10-06 DIAGNOSIS — E039 Hypothyroidism, unspecified: Secondary | ICD-10-CM | POA: Diagnosis not present

## 2020-10-06 DIAGNOSIS — R079 Chest pain, unspecified: Secondary | ICD-10-CM | POA: Diagnosis not present

## 2020-10-06 DIAGNOSIS — Z20822 Contact with and (suspected) exposure to covid-19: Secondary | ICD-10-CM | POA: Insufficient documentation

## 2020-10-06 DIAGNOSIS — Z853 Personal history of malignant neoplasm of breast: Secondary | ICD-10-CM | POA: Diagnosis not present

## 2020-10-06 DIAGNOSIS — R059 Cough, unspecified: Secondary | ICD-10-CM | POA: Diagnosis not present

## 2020-10-06 DIAGNOSIS — R0602 Shortness of breath: Secondary | ICD-10-CM | POA: Diagnosis not present

## 2020-10-06 LAB — BASIC METABOLIC PANEL
Anion gap: 11 (ref 5–15)
BUN: 16 mg/dL (ref 8–23)
CO2: 19 mmol/L — ABNORMAL LOW (ref 22–32)
Calcium: 9.8 mg/dL (ref 8.9–10.3)
Chloride: 103 mmol/L (ref 98–111)
Creatinine, Ser: 0.74 mg/dL (ref 0.44–1.00)
GFR, Estimated: >60 mL/min (ref >60)
Glucose, Bld: 127 mg/dL — ABNORMAL HIGH (ref 70–99)
Potassium: 3.5 mmol/L (ref 3.5–5.1)
Sodium: 133 mmol/L — ABNORMAL LOW (ref 135–145)

## 2020-10-06 LAB — CBC
HCT: 40.3 % (ref 36.0–46.0)
Hemoglobin: 13.4 g/dL (ref 12.0–15.0)
MCH: 31.3 pg (ref 26.0–34.0)
MCHC: 33.3 g/dL (ref 30.0–36.0)
MCV: 94.2 fL (ref 80.0–100.0)
Platelets: 305 10*3/uL (ref 150–400)
RBC: 4.28 MIL/uL (ref 3.87–5.11)
RDW: 12.7 % (ref 11.5–15.5)
WBC: 8.1 10*3/uL (ref 4.0–10.5)
nRBC: 0 % (ref 0.0–0.2)

## 2020-10-06 LAB — SARS CORONAVIRUS 2 BY RT PCR (HOSPITAL ORDER, PERFORMED IN ~~LOC~~ HOSPITAL LAB): SARS Coronavirus 2: NEGATIVE

## 2020-10-06 LAB — TROPONIN I (HIGH SENSITIVITY)
Troponin I (High Sensitivity): 5 ng/L (ref <18)
Troponin I (High Sensitivity): 4 ng/L (ref <18)

## 2020-10-06 NOTE — ED Triage Notes (Signed)
Pt presents with multiple complaints. C/o intermittent chest pain, headaches, cough, chills, nausea/vomiting/diarrhea. Symptoms present x 1 week.

## 2020-10-06 NOTE — ED Notes (Signed)
Received pt from lobby, ambulatory with steady gait.  

## 2020-10-07 LAB — SARS CORONAVIRUS 2 (TAT 6-24 HRS): SARS Coronavirus 2: NEGATIVE

## 2020-10-07 MED ORDER — LACTATED RINGERS IV BOLUS
1000.0000 mL | Freq: Once | INTRAVENOUS | Status: AC
  Administered 2020-10-07: 1000 mL via INTRAVENOUS

## 2020-10-07 NOTE — ED Provider Notes (Signed)
[facility] EMERGENCY DEPARTMENT Provider Note   CSN: [id number] Arrival date & time: 10/06/20  1458     History Chief Complaint  Patient presents with   Chest Pain    Destiny Moody is a 70 y.o. female.   Chest Pain Pain location:  Unable to specify Pain quality: aching, sharp and stabbing   Pain radiates to:  Does not radiate Pain severity:  Mild Onset quality:  Gradual Duration:  1 week Timing:  Constant Progression:  Improving Chronicity:  New Context: breathing   Relieved by:  None tried Worsened by:  Nothing Ineffective treatments:  None tried Associated symptoms: cough, fever, shortness of breath, vomiting and weakness        Past Medical History:  Diagnosis Date   Allergy    Arthritis    Breast cancer ([place]) 08/24/14   right, upper inner   Cancer ([place])    Chronic kidney disease    Depression    no meds   HSV (herpes simplex virus) anogenital infection 2018   Hypercholesterolemia    Hypertension    Hypothyroidism    Multinodular thyroid    NIDDM (non-insulin dependent diabetes mellitus)    Osteoporosis 2020   T score -2.5 stable from prior DEXA   Radiation 01/31/15-03/01/15   right  breast   Rheumatoid arthritis(714.0)    lower back   Wears contact lenses     Patient Active Problem List   Diagnosis Date Noted   Sepsis due to urinary tract infection ([place]) 08/26/2019   Acute pyelonephritis 08/26/2019   Sepsis secondary to UTI ([place]) 05/08/2019   Hyperlipidemia associated with type 2 diabetes mellitus ([place])    History of breast cancer    Hypertension    Hypothyroidism    Age-related osteoporosis without current pathological fracture 12/27/2016   Genital herpes in women 12/20/2016   Wrist pain, chronic, right 08/01/2016   Genetic testing 04/27/2015   Family history of colon cancer    Family history of ovarian cancer    Diabetes mellitus type II, non insulin dependent ([place]) 03/31/2015    Malignant neoplasm of upper-inner quadrant of right breast in female, estrogen receptor positive ([place]) 08/26/2014   Osteoporosis 08/10/2014   Vaginal atrophy 04/27/2014   Diabetes ([place]) 04/17/2011   GERD 04/07/2008   FATTY LIVER DISEASE 04/07/2008    Past Surgical History:  Procedure Laterality Date   BREAST LUMPECTOMY Right 2016   BREAST SURGERY     Lumpectomy   CESAREAN SECTION  6962,9528   x 2   CHOLECYSTECTOMY  1993   COLONOSCOPY  last 06/15/2016   greater than 12 years but not sure where colonoscopy was performed   INCISION AND DRAINAGE ABSCESS Right 11/24/2014   Procedure: INCISION AND DRAINAGE RIGHT AXILLA  ABSCESS;  Surgeon: Fanny Skates, MD;  Location: [facility];  Service: General;  Laterality: Right;   RADIOACTIVE SEED GUIDED PARTIAL MASTECTOMY WITH AXILLARY SENTINEL LYMPH NODE BIOPSY Right 10/12/2014   Procedure: RIGHT  PARTIAL MASTECTOMY WITH RADIOACTIVE SEED LOCALIZATION  RIGHT  AXILLARY SENTINEL  NODE BIOPSY;  Surgeon: Fanny Skates, MD;  Location: [facility];  Service: General;  Laterality: Right;   TUBAL LIGATION  1996   re annastomosis   WISDOM TOOTH EXTRACTION       OB History    Gravida  2   Para  2   Term      Preterm      AB      Living  2  SAB      IAB      Ectopic      Multiple      Live Births              Family History  Problem Relation Age of Onset   Diabetes Mother        niddm   Heart disease Mother    Heart failure Father    Heart disease Father    Ovarian cancer Cousin 51   Hyperlipidemia Sister    Colon cancer Maternal Uncle 75   Esophageal cancer Neg Hx    Rectal cancer Neg Hx    Stomach cancer Neg Hx    Colon polyps Neg Hx     Social History   Tobacco Use   Smoking status: Never Smoker   Smokeless tobacco: Never Used  Vaping Use   Vaping Use: Never used  Substance Use Topics   Alcohol use: Not Currently    Alcohol/week: 0.0 standard drinks   Drug use: No     Home Medications Prior to Admission medications   Medication Sig Start Date End Date Taking? Authorizing Provider  acetaminophen (TYLENOL) 325 MG tablet Take 2 tablets (650 mg total) by mouth every 6 (six) hours as needed for mild pain (or Fever >/= 101). 08/31/19   Blain Pais, MD  amitriptyline (ELAVIL) 25 MG tablet TAKE ONE TABLET BY MOUTH AT BEDTIME  "OFFICE VISIT NEEDED" Patient taking differently: Take 25 mg by mouth at bedtime as needed for sleep.  10/18/15   Copland, Gay Filler, MD  aspirin 81 MG tablet Take 81 mg by mouth daily.    [provider]  Blood Glucose Monitoring Suppl ([place]) w/Device KIT  06/03/19   [provider]  Calcium Carbonate-Vitamin D 600-200 MG-UNIT TABS Take 1 tablet by mouth daily.     [provider]  glimepiride (AMARYL) 2 MG tablet Take 2 mg by mouth daily before breakfast. 07/29/19   [provider]  Glucosamine 500 MG CAPS Take 1 capsule by mouth daily.    [provider]  hydrocortisone (ANUSOL-HC) 2.5 % rectal cream Place 1 application rectally 2 (two) times daily. Make sure cream is inside. Patient taking differently: Place 1 application rectally daily as needed for hemorrhoids. Make sure cream is inside. 05/21/19   Willia Craze, NP  liraglutide (VICTOZA) 18 MG/3ML SOPN Inject 1.2 mg into the skin daily.    [provider]  lisinopril (ZESTRIL) 2.5 MG tablet Take 2.5 mg by mouth daily. 07/07/19   [provider]  metFORMIN (GLUCOPHAGE-XR) 500 MG 24 hr tablet TAKE TWO TABLETS BY MOUTH TWICE DAILY Patient taking differently: Take 1,000 mg by mouth in the morning and at bedtime.  10/28/16   Forrest Moron, MD  Multiple Vitamin (MULTIVITAMIN) capsule Take 1 capsule by mouth daily.    [provider]  Omega-3 Fatty Acids (OMEGA-3 FISH OIL PO) Take 1 capsule by mouth daily.     [provider]  oxybutynin (DITROPAN) 5 MG tablet Take 1 tablet (5 mg  total) by mouth 2 (two) times daily. 08/31/19   Blain Pais, MD  pioglitazone (ACTOS) 45 MG tablet Take 45 mg by mouth daily. 07/11/19   [provider]  predniSONE (DELTASONE) 50 MG tablet 50 mg PO 13, 7 and 1 hour before CT scan. Take Benadryl 50 mg PO 1 hour before CT scan. 07/14/20   Tanner, Lyndon Code., PA-C  risedronate (ACTONEL) 150 MG  tablet Take 1 tablet (150 mg total) by mouth every 30 (thirty) days. with water on empty stomach, nothing by mouth or lie down for next 30 minutes. 07/08/20   Joseph Pierini, MD  simvastatin (ZOCOR) 20 MG tablet Take 1 tablet (20 mg total) by mouth at bedtime. 10/18/15   Copland, Gay Filler, MD  tamoxifen (NOLVADEX) 20 MG tablet Take 1 tablet (20 mg total) by mouth daily. 02/26/19   Magrinat, Virgie Dad, MD  tiZANidine (ZANAFLEX) 2 MG tablet Take 2 mg by mouth daily as needed for muscle spasms.  12/09/19   [provider]  triamcinolone cream (KENALOG) 0.1 % Apply 1 application topically daily as needed (dry skin).  03/26/19   [provider]  trimethoprim (TRIMPEX) 100 MG tablet Take 100 mg by mouth 2 (two) times daily.    [provider]  TURMERIC PO Take 1 capsule by mouth daily.    [provider]    Allergies    Shellfish allergy  Review of Systems   Review of Systems  Constitutional: Positive for fever.  Respiratory: Positive for cough and shortness of breath.   Cardiovascular: Positive for chest pain.  Gastrointestinal: Positive for vomiting.  Neurological: Positive for weakness.  All other systems reviewed and are negative.   Physical Exam Updated Vital Signs BP (!) 113/59    Pulse 70    Temp (!) 97.5 F (36.4 C) (Oral)    Resp (!) 24    Ht 5' 2" (1.575 m)    Wt 63.5 kg    SpO2 100%    BMI 25.61 kg/m   Physical Exam Vitals and nursing note reviewed.  Constitutional:      Appearance: She is well-developed and well-nourished.  HENT:     Head: Normocephalic and atraumatic.  Cardiovascular:      Rate and Rhythm: Normal rate and regular rhythm.  Pulmonary:     Effort: No respiratory distress.     Breath sounds: No stridor.  Abdominal:     General: There is no distension.  Musculoskeletal:     Cervical back: Normal range of motion.  Neurological:     Mental Status: She is alert.     ED Results / Procedures / Treatments   Labs (all labs ordered are listed, but only abnormal results are displayed) Labs Reviewed  BASIC METABOLIC PANEL - Abnormal; Notable for the following components:      Result Value   Sodium 133 (*)    CO2 19 (*)    Glucose, Bld 127 (*)    All other components within normal limits  SARS CORONAVIRUS 2 BY RT PCR (HOSPITAL ORDER, [facility])  SARS CORONAVIRUS 2 (TAT 6-24 HRS)  CBC  TROPONIN I (HIGH SENSITIVITY)  TROPONIN I (HIGH SENSITIVITY)    EKG None  Radiology DG Chest Portable 1 View  Result Date: 10/06/2020 CLINICAL DATA:  Chest pain COVID symptoms EXAM: PORTABLE CHEST 1 VIEW COMPARISON:  08/25/2019, CT 08/09/2020 FINDINGS: Postsurgical changes over the right breast. No focal opacity or pleural effusion. Normal cardiomediastinal silhouette with aortic atherosclerosis. No pneumothorax IMPRESSION: No active disease. Electronically Signed   By: Donavan Foil M.D.   On: 10/06/2020 15:45    Procedures Procedures (including critical care time)  Medications Ordered in ED Medications  lactated ringers bolus 1,000 mL (0 mLs Intravenous Stopped 10/07/20 0107)    ED Course  I have reviewed the triage vital signs and the nursing notes.  Pertinent labs & imaging results  that were available during my care of the patient were reviewed by me and considered in my medical decision making (see chart for details).    MDM Rules/Calculators/A&P                          Multiple viral like symptoms that seem to be improving. Couldn't see PCP in office (told to come to ED) for follow up so is here. Low suspicion for ACS, PE,  penumonia or other acute causes.   Final Clinical Impression(s) / ED Diagnoses Final diagnoses:  Viral illness    Rx / DC Orders ED Discharge Orders    None       Devina Bezold, Corene Cornea, MD 10/07/20 [phone]

## 2020-11-10 ENCOUNTER — Telehealth: Payer: Self-pay | Admitting: *Deleted

## 2020-11-10 NOTE — Telephone Encounter (Signed)
Patient called sating her insurance told her the Actonel 150 mg tablet is no longer covered and she will need to switch to another Rx. Please advise

## 2020-11-11 MED ORDER — ALENDRONATE SODIUM 70 MG PO TABS: 70.0000 mg | ORAL_TABLET | ORAL | 3 refills | Status: DC

## 2020-11-11 NOTE — Telephone Encounter (Signed)
Patient informed. Rx sent 

## 2020-11-11 NOTE — Telephone Encounter (Signed)
If she has never had Fosamax (alendronate) before, let's start her on this at 70 mg weekly

## 2020-11-16 DIAGNOSIS — M25511 Pain in right shoulder: Secondary | ICD-10-CM | POA: Diagnosis not present

## 2020-11-16 DIAGNOSIS — M79601 Pain in right arm: Secondary | ICD-10-CM | POA: Diagnosis not present

## 2020-11-17 ENCOUNTER — Inpatient Hospital Stay (HOSPITAL_COMMUNITY): Admission: RE | Admit: 2020-11-17 | Payer: Medicare Other | Source: Ambulatory Visit

## 2020-11-17 ENCOUNTER — Ambulatory Visit: Payer: Medicare Other | Admitting: Vascular Surgery

## 2020-11-23 DIAGNOSIS — M25521 Pain in right elbow: Secondary | ICD-10-CM | POA: Diagnosis not present

## 2020-11-23 DIAGNOSIS — M7711 Lateral epicondylitis, right elbow: Secondary | ICD-10-CM | POA: Diagnosis not present

## 2020-12-05 DIAGNOSIS — M25521 Pain in right elbow: Secondary | ICD-10-CM | POA: Diagnosis not present

## 2020-12-05 DIAGNOSIS — E118 Type 2 diabetes mellitus with unspecified complications: Secondary | ICD-10-CM | POA: Diagnosis not present

## 2020-12-05 DIAGNOSIS — E7800 Pure hypercholesterolemia, unspecified: Secondary | ICD-10-CM | POA: Diagnosis not present

## 2020-12-14 DIAGNOSIS — M25521 Pain in right elbow: Secondary | ICD-10-CM | POA: Diagnosis not present

## 2020-12-15 ENCOUNTER — Encounter: Payer: Self-pay | Admitting: Vascular Surgery

## 2020-12-21 DIAGNOSIS — M7711 Lateral epicondylitis, right elbow: Secondary | ICD-10-CM | POA: Diagnosis not present

## 2021-01-15 HISTORY — PX: OTHER SURGICAL HISTORY: SHX169

## 2021-01-17 DIAGNOSIS — M7711 Lateral epicondylitis, right elbow: Secondary | ICD-10-CM | POA: Diagnosis not present

## 2021-01-25 ENCOUNTER — Other Ambulatory Visit: Payer: Self-pay

## 2021-01-25 MED ORDER — ALENDRONATE SODIUM 70 MG PO TABS
70.0000 mg | ORAL_TABLET | ORAL | 0 refills | Status: DC
Start: 2021-01-25 — End: 2021-07-13

## 2021-02-08 DIAGNOSIS — M7711 Lateral epicondylitis, right elbow: Secondary | ICD-10-CM | POA: Diagnosis not present

## 2021-02-20 DIAGNOSIS — H25813 Combined forms of age-related cataract, bilateral: Secondary | ICD-10-CM | POA: Diagnosis not present

## 2021-02-20 DIAGNOSIS — H0288A Meibomian gland dysfunction right eye, upper and lower eyelids: Secondary | ICD-10-CM | POA: Diagnosis not present

## 2021-02-20 DIAGNOSIS — H04123 Dry eye syndrome of bilateral lacrimal glands: Secondary | ICD-10-CM | POA: Diagnosis not present

## 2021-02-20 DIAGNOSIS — H35033 Hypertensive retinopathy, bilateral: Secondary | ICD-10-CM | POA: Diagnosis not present

## 2021-02-20 DIAGNOSIS — E119 Type 2 diabetes mellitus without complications: Secondary | ICD-10-CM | POA: Diagnosis not present

## 2021-02-20 DIAGNOSIS — H0288B Meibomian gland dysfunction left eye, upper and lower eyelids: Secondary | ICD-10-CM | POA: Diagnosis not present

## 2021-02-23 DIAGNOSIS — M25521 Pain in right elbow: Secondary | ICD-10-CM | POA: Diagnosis not present

## 2021-02-27 ENCOUNTER — Ambulatory Visit (INDEPENDENT_AMBULATORY_CARE_PROVIDER_SITE_OTHER): Payer: Medicare Other | Admitting: Obstetrics & Gynecology

## 2021-02-27 ENCOUNTER — Other Ambulatory Visit: Payer: Self-pay

## 2021-02-27 ENCOUNTER — Encounter: Payer: Self-pay | Admitting: Obstetrics & Gynecology

## 2021-02-27 VITALS — BP 118/60 | Ht 61.75 in | Wt 140.0 lb

## 2021-02-27 DIAGNOSIS — Z01419 Encounter for gynecological examination (general) (routine) without abnormal findings: Secondary | ICD-10-CM | POA: Diagnosis not present

## 2021-02-27 DIAGNOSIS — Z170 Estrogen receptor positive status [ER+]: Secondary | ICD-10-CM

## 2021-02-27 DIAGNOSIS — M810 Age-related osteoporosis without current pathological fracture: Secondary | ICD-10-CM

## 2021-02-27 DIAGNOSIS — C50211 Malignant neoplasm of upper-inner quadrant of right female breast: Secondary | ICD-10-CM

## 2021-02-27 DIAGNOSIS — Z780 Asymptomatic menopausal state: Secondary | ICD-10-CM

## 2021-02-27 NOTE — Progress Notes (Signed)
Destiny Moody [birth year] [id number]   History:    70 y.o. G2P2L2 Married  RP:  Established patient presenting for annual gyn exam   HPI: Postmenopausal, on no HRT.  No PMB.  No pelvic pain. Pap smear negative 01/2019.  Pelvic US 02/2020 Endometrium thin at 2.2 mm and stable very small simple remnants of follicles on the left ovary.  History ER/PR positive right breast cancer 5 yrs ago followed by Dr Jana Hakim.  Colonoscopy 2017.  DEXA 02/2019.  T score -2.5 at Rt femoral neck, BMD stable.  Stopped Boniva because it was not covered by insurance.  Health labs with Fam MD.  BMI 25.81.  Past medical history,surgical history, family history and social history were all reviewed and documented in the EPIC chart.  Gynecologic History No LMP recorded. Patient is postmenopausal.  Obstetric History OB History  Gravida Para Term Preterm AB Living  2 2       2   SAB IAB Ectopic Multiple Live Births               # Outcome Date GA Lbr Len/2nd Weight Sex Delivery Anes PTL Lv  2 Para      CS-LTranv     1 Para      CS-LTranv        ROS: A ROS was performed and pertinent positives and negatives are included in the history.  GENERAL: No fevers or chills. HEENT: No change in vision, no earache, sore throat or sinus congestion. NECK: No pain or stiffness. CARDIOVASCULAR: No chest pain or pressure. No palpitations. PULMONARY: No shortness of breath, cough or wheeze. GASTROINTESTINAL: No abdominal pain, nausea, vomiting or diarrhea, melena or bright red blood per rectum. GENITOURINARY: No urinary frequency, urgency, hesitancy or dysuria. MUSCULOSKELETAL: No joint or muscle pain, no back pain, no recent trauma. DERMATOLOGIC: No rash, no itching, no lesions. ENDOCRINE: No polyuria, polydipsia, no heat or cold intolerance. No recent change in weight. HEMATOLOGICAL: No anemia or easy bruising or bleeding. NEUROLOGIC: No headache, seizures, numbness, tingling or weakness. PSYCHIATRIC: No depression, no loss of  interest in normal activity or change in sleep pattern.     Exam:   BP 118/60   Ht 5' 1.75" (1.568 m)   Wt 140 lb (63.5 kg)   BMI 25.81 kg/m   Body mass index is 25.81 kg/m.  General appearance : Well developed well nourished female. No acute distress HEENT: Eyes: no retinal hemorrhage or exudates,  Neck supple, trachea midline, no carotid bruits, no thyroidmegaly Lungs: Clear to auscultation, no rhonchi or wheezes, or rib retractions  Heart: Regular rate and rhythm, no murmurs or gallops Breast:Examined in sitting and supine position were symmetrical in appearance, no palpable masses or tenderness,  no skin retraction, no nipple inversion, no nipple discharge, no skin discoloration, no axillary or supraclavicular lymphadenopathy Abdomen: no palpable masses or tenderness, no rebound or guarding Extremities: no edema or skin discoloration or tenderness  Pelvic: Vulva: Normal             Vagina: No gross lesions or discharge  Cervix: No gross lesions or discharge  Uterus  AV, normal size, shape and consistency, non-tender and mobile  Adnexa  Without masses or tenderness  Anus: Normal   Assessment/Plan:  70 y.o. female for annual exam   1. Well female exam with routine gynecological exam Normal gynecologic exam in menopause.  No indication for a pap test this year, Pap neg 01/2019.  Breasts normal with H/O Rt Breast Ca.  Bilateral MRI Neg 07/2020.  [place] 2017.  Health labs with Fam MD.  BMI 25.81.  2. Postmenopause Well on no HRT.  No PMB.  3. Osteoporosis, unspecified osteoporosis type, unspecified pathological fracture presence Repeat BD here now.  Will probably need to restart on Bone medication.  Vit D supplement, Ca++ 1.5 g/d total, weight bearing physical activities.   - DG Bone Density; Future  4. Malignant neoplasm of upper-inner quadrant of right breast in female, estrogen receptor positive ([place])  Followed by Dr Jana Hakim.  Princess Bruins MD, 11:29 AM  02/27/2021

## 2021-03-07 DIAGNOSIS — J029 Acute pharyngitis, unspecified: Secondary | ICD-10-CM | POA: Diagnosis not present

## 2021-03-07 DIAGNOSIS — R0981 Nasal congestion: Secondary | ICD-10-CM | POA: Diagnosis not present

## 2021-03-07 DIAGNOSIS — R059 Cough, unspecified: Secondary | ICD-10-CM | POA: Diagnosis not present

## 2021-03-07 DIAGNOSIS — R5383 Other fatigue: Secondary | ICD-10-CM | POA: Diagnosis not present

## 2021-03-07 DIAGNOSIS — Z03818 Encounter for observation for suspected exposure to other biological agents ruled out: Secondary | ICD-10-CM | POA: Diagnosis not present

## 2021-03-13 DIAGNOSIS — E11319 Type 2 diabetes mellitus with unspecified diabetic retinopathy without macular edema: Secondary | ICD-10-CM | POA: Diagnosis not present

## 2021-03-13 DIAGNOSIS — E7800 Pure hypercholesterolemia, unspecified: Secondary | ICD-10-CM | POA: Diagnosis not present

## 2021-03-13 DIAGNOSIS — Z7984 Long term (current) use of oral hypoglycemic drugs: Secondary | ICD-10-CM | POA: Diagnosis not present

## 2021-03-21 ENCOUNTER — Other Ambulatory Visit: Payer: Self-pay | Admitting: Obstetrics & Gynecology

## 2021-03-21 ENCOUNTER — Other Ambulatory Visit: Payer: Self-pay

## 2021-03-21 ENCOUNTER — Ambulatory Visit (INDEPENDENT_AMBULATORY_CARE_PROVIDER_SITE_OTHER): Payer: Medicare Other

## 2021-03-21 DIAGNOSIS — M810 Age-related osteoporosis without current pathological fracture: Secondary | ICD-10-CM | POA: Diagnosis not present

## 2021-03-21 DIAGNOSIS — Z780 Asymptomatic menopausal state: Secondary | ICD-10-CM

## 2021-03-21 DIAGNOSIS — H524 Presbyopia: Secondary | ICD-10-CM | POA: Diagnosis not present

## 2021-03-21 DIAGNOSIS — H0288A Meibomian gland dysfunction right eye, upper and lower eyelids: Secondary | ICD-10-CM | POA: Diagnosis not present

## 2021-03-21 DIAGNOSIS — H527 Unspecified disorder of refraction: Secondary | ICD-10-CM | POA: Diagnosis not present

## 2021-03-21 DIAGNOSIS — H52203 Unspecified astigmatism, bilateral: Secondary | ICD-10-CM | POA: Diagnosis not present

## 2021-03-21 DIAGNOSIS — H5213 Myopia, bilateral: Secondary | ICD-10-CM | POA: Diagnosis not present

## 2021-03-21 DIAGNOSIS — H35033 Hypertensive retinopathy, bilateral: Secondary | ICD-10-CM | POA: Diagnosis not present

## 2021-03-21 DIAGNOSIS — E119 Type 2 diabetes mellitus without complications: Secondary | ICD-10-CM | POA: Diagnosis not present

## 2021-03-21 DIAGNOSIS — H25813 Combined forms of age-related cataract, bilateral: Secondary | ICD-10-CM | POA: Diagnosis not present

## 2021-03-21 DIAGNOSIS — H0288B Meibomian gland dysfunction left eye, upper and lower eyelids: Secondary | ICD-10-CM | POA: Diagnosis not present

## 2021-03-21 DIAGNOSIS — H04123 Dry eye syndrome of bilateral lacrimal glands: Secondary | ICD-10-CM | POA: Diagnosis not present

## 2021-03-23 DIAGNOSIS — M25621 Stiffness of right elbow, not elsewhere classified: Secondary | ICD-10-CM | POA: Diagnosis not present

## 2021-06-26 DIAGNOSIS — R519 Headache, unspecified: Secondary | ICD-10-CM | POA: Diagnosis not present

## 2021-06-26 DIAGNOSIS — Z7984 Long term (current) use of oral hypoglycemic drugs: Secondary | ICD-10-CM | POA: Diagnosis not present

## 2021-06-26 DIAGNOSIS — R202 Paresthesia of skin: Secondary | ICD-10-CM | POA: Diagnosis not present

## 2021-06-27 ENCOUNTER — Encounter: Payer: Self-pay | Admitting: Gastroenterology

## 2021-07-10 ENCOUNTER — Other Ambulatory Visit: Payer: Self-pay | Admitting: Obstetrics & Gynecology

## 2021-07-10 ENCOUNTER — Telehealth: Payer: Self-pay

## 2021-07-10 DIAGNOSIS — Z1231 Encounter for screening mammogram for malignant neoplasm of breast: Secondary | ICD-10-CM

## 2021-07-10 NOTE — Telephone Encounter (Signed)
Patient called because she received her BD results from July. She is out of medication and would like to have the once a month medication "as it is better for me".  (On her BD result is states "continue Jaclyn Prime' but I see Fosamax in her chart".

## 2021-07-13 ENCOUNTER — Other Ambulatory Visit: Payer: Self-pay

## 2021-07-13 NOTE — Telephone Encounter (Signed)
Left message Dr. Tiburcio Pea Rx. Asked her to call me with preferred pharmacy.

## 2021-07-15 ENCOUNTER — Ambulatory Visit
Admission: RE | Admit: 2021-07-15 | Discharge: 2021-07-15 | Disposition: A | Discharge status: Home / Self Care | Payer: Medicare Other | Source: Ambulatory Visit | Attending: Obstetrics & Gynecology | Admitting: Obstetrics & Gynecology

## 2021-07-15 DIAGNOSIS — Z1231 Encounter for screening mammogram for malignant neoplasm of breast: Secondary | ICD-10-CM

## 2021-07-18 MED ORDER — IBANDRONATE SODIUM 150 MG PO TABS: 150.0000 mg | ORAL_TABLET | ORAL | 2 refills | Status: DC

## 2021-07-18 NOTE — Telephone Encounter (Signed)
I sent Rx to [place] on [facility] as that is where she was getting her Fosamax.

## 2021-07-19 ENCOUNTER — Emergency Department (HOSPITAL_COMMUNITY)
Admission: EM | Admit: 2021-07-19 | Discharge: 2021-07-20 | Disposition: A | Discharge status: Home / Self Care | Payer: Medicare Other | Attending: Emergency Medicine | Admitting: Emergency Medicine

## 2021-07-19 ENCOUNTER — Other Ambulatory Visit: Payer: Self-pay

## 2021-07-19 ENCOUNTER — Emergency Department (HOSPITAL_COMMUNITY): Payer: Medicare Other

## 2021-07-19 ENCOUNTER — Encounter (HOSPITAL_COMMUNITY): Payer: Self-pay

## 2021-07-19 DIAGNOSIS — Z7984 Long term (current) use of oral hypoglycemic drugs: Secondary | ICD-10-CM | POA: Diagnosis not present

## 2021-07-19 DIAGNOSIS — Z79899 Other long term (current) drug therapy: Secondary | ICD-10-CM | POA: Diagnosis not present

## 2021-07-19 DIAGNOSIS — I129 Hypertensive chronic kidney disease with stage 1 through stage 4 chronic kidney disease, or unspecified chronic kidney disease: Secondary | ICD-10-CM | POA: Diagnosis not present

## 2021-07-19 DIAGNOSIS — E1122 Type 2 diabetes mellitus with diabetic chronic kidney disease: Secondary | ICD-10-CM | POA: Diagnosis not present

## 2021-07-19 DIAGNOSIS — E785 Hyperlipidemia, unspecified: Secondary | ICD-10-CM | POA: Diagnosis not present

## 2021-07-19 DIAGNOSIS — R1013 Epigastric pain: Secondary | ICD-10-CM | POA: Diagnosis not present

## 2021-07-19 DIAGNOSIS — Z853 Personal history of malignant neoplasm of breast: Secondary | ICD-10-CM | POA: Diagnosis not present

## 2021-07-19 DIAGNOSIS — K219 Gastro-esophageal reflux disease without esophagitis: Secondary | ICD-10-CM | POA: Diagnosis not present

## 2021-07-19 DIAGNOSIS — R1110 Vomiting, unspecified: Secondary | ICD-10-CM | POA: Diagnosis not present

## 2021-07-19 DIAGNOSIS — E1169 Type 2 diabetes mellitus with other specified complication: Secondary | ICD-10-CM | POA: Diagnosis not present

## 2021-07-19 DIAGNOSIS — E039 Hypothyroidism, unspecified: Secondary | ICD-10-CM | POA: Diagnosis not present

## 2021-07-19 DIAGNOSIS — R109 Unspecified abdominal pain: Secondary | ICD-10-CM | POA: Diagnosis not present

## 2021-07-19 DIAGNOSIS — N189 Chronic kidney disease, unspecified: Secondary | ICD-10-CM | POA: Diagnosis not present

## 2021-07-19 LAB — COMPREHENSIVE METABOLIC PANEL
ALT: 19 U/L (ref 0–44)
AST: 21 U/L (ref 15–41)
Albumin: 4.3 g/dL (ref 3.5–5.0)
Alkaline Phosphatase: 45 U/L (ref 38–126)
Anion gap: 12 (ref 5–15)
BUN: 17 mg/dL (ref 8–23)
CO2: 21 mmol/L — ABNORMAL LOW (ref 22–32)
Calcium: 9.9 mg/dL (ref 8.9–10.3)
Chloride: 105 mmol/L (ref 98–111)
Creatinine, Ser: 0.7 mg/dL (ref 0.44–1.00)
GFR, Estimated: >60 mL/min (ref >60)
Glucose, Bld: 112 mg/dL — ABNORMAL HIGH (ref 70–99)
Potassium: 3.3 mmol/L — ABNORMAL LOW (ref 3.5–5.1)
Sodium: 138 mmol/L (ref 135–145)
Total Bilirubin: 0.9 mg/dL (ref 0.3–1.2)
Total Protein: 7.5 g/dL (ref 6.5–8.1)

## 2021-07-19 LAB — URINALYSIS, ROUTINE W REFLEX MICROSCOPIC
Bilirubin Urine: NEGATIVE
Glucose, UA: NEGATIVE mg/dL
Hgb urine dipstick: NEGATIVE
Ketones, ur: NEGATIVE mg/dL
Leukocytes,Ua: NEGATIVE
Nitrite: NEGATIVE
Protein, ur: NEGATIVE mg/dL
Specific Gravity, Urine: 1.004 — ABNORMAL LOW (ref 1.005–1.030)
pH: 9 — ABNORMAL HIGH (ref 5.0–8.0)

## 2021-07-19 LAB — CBC WITH DIFFERENTIAL/PLATELET
Abs Immature Granulocytes: 0.02 10*3/uL (ref 0.00–0.07)
Basophils Absolute: 0 10*3/uL (ref 0.0–0.1)
Basophils Relative: 1 %
Eosinophils Absolute: 0 10*3/uL (ref 0.0–0.5)
Eosinophils Relative: 0 %
HCT: 39.8 % (ref 36.0–46.0)
Hemoglobin: 13.4 g/dL (ref 12.0–15.0)
Immature Granulocytes: 0 %
Lymphocytes Relative: 35 %
Lymphs Abs: 3 10*3/uL (ref 0.7–4.0)
MCH: 31.2 pg (ref 26.0–34.0)
MCHC: 33.7 g/dL (ref 30.0–36.0)
MCV: 92.6 fL (ref 80.0–100.0)
Monocytes Absolute: 0.7 10*3/uL (ref 0.1–1.0)
Monocytes Relative: 8 %
Neutro Abs: 4.8 10*3/uL (ref 1.7–7.7)
Neutrophils Relative %: 56 %
Platelets: 285 10*3/uL (ref 150–400)
RBC: 4.3 MIL/uL (ref 3.87–5.11)
RDW: 12.5 % (ref 11.5–15.5)
WBC: 8.6 10*3/uL (ref 4.0–10.5)
nRBC: 0 % (ref 0.0–0.2)

## 2021-07-19 LAB — LIPASE, BLOOD: Lipase: 43 U/L (ref 11–51)

## 2021-07-19 MED ORDER — IOHEXOL 350 MG/ML SOLN
80.0000 mL | Freq: Once | INTRAVENOUS | Status: AC | PRN
  Administered 2021-07-19: 80 mL via INTRAVENOUS

## 2021-07-19 NOTE — ED Triage Notes (Signed)
Pt c/o abdominal pain, N/V x2 weeks. Pt denies diarrhea.

## 2021-07-19 NOTE — ED Provider Notes (Signed)
Emergency Medicine Provider Triage Evaluation Note  Destiny Moody , a 70 y.o. female  was evaluated in triage.  Pt complains of diffuse abdominal pain over the last 2 weeks.  He is also reporting associated nausea and vomiting.  Today began having urinary symptoms.  No fever or chills.  Review of Systems  Positive: Dysuria, urinary frequency  Negative:   Physical Exam  BP 120/83 (BP Location: Left Arm)   Pulse 87   Temp 98.1 F (36.7 C) (Oral)   Resp 16   SpO2 100%  Gen:   Awake, no distress   Resp:  Normal effort  MSK:   Moves extremities without difficulty  Other:  Moderate suprapubic tenderness  Medical Decision Making  Medically screening exam initiated at 6:36 PM.  Appropriate orders placed.  [name] was informed that the remainder of the evaluation will be completed by another provider, this initial triage assessment does not replace that evaluation, and the importance of remaining in the ED until their evaluation is complete.     Myna Bright [place], PA-C 07/19/21 Kristian Covey    Dorie Rank, MD 07/20/21 1104

## 2021-07-20 MED ORDER — SUCRALFATE 1 G PO TABS: 1.0000 g | ORAL_TABLET | Freq: Three times a day (TID) | ORAL | 1 refills | Status: DC

## 2021-07-20 MED ORDER — TRAMADOL HCL 50 MG PO TABS: 50.0000 mg | ORAL_TABLET | Freq: Four times a day (QID) | ORAL | 0 refills | Status: DC | PRN

## 2021-07-20 MED ORDER — PANTOPRAZOLE SODIUM 20 MG PO TBEC: 20.0000 mg | DELAYED_RELEASE_TABLET | Freq: Two times a day (BID) | ORAL | 1 refills | Status: DC

## 2021-07-20 NOTE — Discharge Instructions (Signed)
Begin taking Protonix and Carafate as prescribed.  Take tramadol as prescribed as needed for pain.  Follow-up with [facility] gastroenterology if your symptoms are not improving in the next week.  Return to the emergency department in the meantime if you develop worsening pain, high fever, bloody stool or vomit, or other new and concerning symptoms.

## 2021-07-20 NOTE — ED Provider Notes (Signed)
[facility] DEPT Provider Note   CSN: [id number] Arrival date & time: 07/19/21  1635     History Chief Complaint  Patient presents with   Abdominal Pain   Emesis    Destiny Moody is a 70 y.o. female.  Patient is a 70 year old female with past medical history of hypertension, hypothyroidism, type 2 diabetes, prior cholecystectomy, and breast cancer.  Patient presenting today with complaints of abdominal pain.  She describes crampy, intermittent pain to the epigastric region that has been occurring for the past 2 weeks.  This seems worse when she attempts to eat.  She describes bloating and belching.  She denies having any diarrhea or constipation.  She denies any black or bloody stools.  She denies any fevers or chills.  Patient tells me she has been on medicine for ulcers in the past, however has not been on these medications for over 6 months.  The history is provided by the patient.  Abdominal Pain Pain location:  Epigastric Pain quality: cramping   Pain radiates to:  Does not radiate Pain severity:  Moderate Onset quality:  Sudden Duration:  2 weeks Timing:  Intermittent Chronicity:  New Relieved by:  Nothing Worsened by:  Palpation and eating Associated symptoms: vomiting   Emesis Associated symptoms: abdominal pain       Past Medical History:  Diagnosis Date   Allergy    Arthritis    Breast cancer ([place]) 08/24/14   right, upper inner   Cancer ([place])    Chronic kidney disease    Depression    no meds   HSV (herpes simplex virus) anogenital infection 2018   Hypercholesterolemia    Hypertension    Hypothyroidism    Multinodular thyroid    NIDDM (non-insulin dependent diabetes mellitus)    Osteoporosis 2020   T score -2.5 stable from prior DEXA   Radiation 01/31/15-03/01/15   right  breast   Rheumatoid arthritis(714.0)    lower back   Wears contact lenses     Patient Active Problem List   Diagnosis Date Noted   Sepsis due to  urinary tract infection ([place]) 08/26/2019   Acute pyelonephritis 08/26/2019   Sepsis secondary to UTI ([place]) 05/08/2019   Hyperlipidemia associated with type 2 diabetes mellitus ([place])    History of breast cancer    Hypertension    Hypothyroidism    Age-related osteoporosis without current pathological fracture 12/27/2016   Genital herpes in women 12/20/2016   Wrist pain, chronic, right 08/01/2016   Genetic testing 04/27/2015   Family history of colon cancer    Family history of ovarian cancer    Diabetes mellitus type II, non insulin dependent ([place]) 03/31/2015   Malignant neoplasm of upper-inner quadrant of right breast in female, estrogen receptor positive ([place]) 08/26/2014   Osteoporosis 08/10/2014   Vaginal atrophy 04/27/2014   Diabetes ([place]) 04/17/2011   GERD 04/07/2008   FATTY LIVER DISEASE 04/07/2008    Past Surgical History:  Procedure Laterality Date   BREAST LUMPECTOMY Right 2016   BREAST SURGERY     Lumpectomy   CESAREAN SECTION  3474,2595   x 2   CHOLECYSTECTOMY  1993   COLONOSCOPY  last 06/15/2016   greater than 12 years but not sure where colonoscopy was performed   INCISION AND DRAINAGE ABSCESS Right 11/24/2014   Procedure: INCISION AND DRAINAGE RIGHT AXILLA  ABSCESS;  Surgeon: Fanny Skates, MD;  Location: [facility];  Service: General;  Laterality: Right;   RADIOACTIVE SEED GUIDED PARTIAL  MASTECTOMY WITH AXILLARY SENTINEL LYMPH NODE BIOPSY Right 10/12/2014   Procedure: RIGHT  PARTIAL MASTECTOMY WITH RADIOACTIVE SEED LOCALIZATION  RIGHT  AXILLARY SENTINEL  NODE BIOPSY;  Surgeon: Fanny Skates, MD;  Location: [facility];  Service: General;  Laterality: Right;   repair of lateral epicondylitis of right humerus  01/15/2021   TUBAL LIGATION  1996   re annastomosis   WISDOM TOOTH EXTRACTION       OB History     Gravida  2   Para  2   Term      Preterm      AB      Living  2      SAB      IAB      Ectopic      Multiple      Live  Births              Family History  Problem Relation Age of Onset   Diabetes Mother        niddm   Heart disease Mother    Heart failure Father    Heart disease Father    Ovarian cancer Cousin 45   Hyperlipidemia Sister    Colon cancer Maternal Uncle 85   Esophageal cancer Neg Hx    Rectal cancer Neg Hx    Stomach cancer Neg Hx    Colon polyps Neg Hx     Social History   Tobacco Use   Smoking status: Never   Smokeless tobacco: Never  Vaping Use   Vaping Use: Never used  Substance Use Topics   Alcohol use: Not Currently    Alcohol/week: 0.0 standard drinks   Drug use: No    Home Medications Prior to Admission medications   Medication Sig Start Date End Date Taking? Authorizing Provider  acetaminophen (TYLENOL) 325 MG tablet Take 2 tablets (650 mg total) by mouth every 6 (six) hours as needed for mild pain (or Fever >/= 101). 08/31/19   Blain Pais, MD  amitriptyline (ELAVIL) 25 MG tablet TAKE ONE TABLET BY MOUTH AT BEDTIME  "OFFICE VISIT NEEDED" Patient taking differently: Take 25 mg by mouth at bedtime as needed for sleep.  10/18/15   Copland, Gay Filler, MD  aspirin 81 MG tablet Take 81 mg by mouth daily. Patient not taking: Reported on 02/27/2021    [provider]  Blood Glucose Monitoring Suppl ([place]) w/Device KIT  06/03/19   [provider]  Calcium Carbonate-Vitamin D 600-200 MG-UNIT TABS Take 1 tablet by mouth daily.     [provider]  glimepiride (AMARYL) 2 MG tablet Take 2 mg by mouth daily before breakfast. 07/29/19   [provider]  Glucosamine 500 MG CAPS Take 1 capsule by mouth daily.    [provider]  hydrocortisone (ANUSOL-HC) 2.5 % rectal cream Place 1 application rectally 2 (two) times daily. Make sure cream is inside. Patient taking differently: Place 1 application rectally daily as needed for hemorrhoids. Make sure cream is inside. 05/21/19   Willia Craze, NP   ibandronate (BONIVA) 150 MG tablet Take 1 tablet (150 mg total) by mouth every 30 (thirty) days. Take in the morning with a full glass of water, on an empty stomach, and do not take anything else by mouth or lie down for the next 30 min. 07/18/21   Princess Bruins, MD  liraglutide (VICTOZA) 18 MG/3ML SOPN Inject 1.2 mg into the skin daily.    [provider]  lisinopril (ZESTRIL) 2.5 MG tablet Take 2.5 mg by mouth daily. 07/07/19   [provider]  metFORMIN (GLUCOPHAGE-XR) 500 MG 24 hr tablet TAKE TWO TABLETS BY MOUTH TWICE DAILY Patient taking differently: Take 1,000 mg by mouth in the morning and at bedtime.  10/28/16   Forrest Moron, MD  Multiple Vitamin (MULTIVITAMIN) capsule Take 1 capsule by mouth daily.    [provider]  Omega-3 Fatty Acids (OMEGA-3 FISH OIL PO) Take 1 capsule by mouth daily.     [provider]  oxybutynin (DITROPAN) 5 MG tablet Take 1 tablet (5 mg total) by mouth 2 (two) times daily. 08/31/19   Blain Pais, MD  simvastatin (ZOCOR) 20 MG tablet Take 1 tablet (20 mg total) by mouth at bedtime. 10/18/15   Copland, Gay Filler, MD  triamcinolone cream (KENALOG) 0.1 % Apply 1 application topically daily as needed (dry skin).  03/26/19   [provider]  TURMERIC PO Take 1 capsule by mouth daily.    [provider]    Allergies    Shellfish allergy  Review of Systems   Review of Systems  Gastrointestinal:  Positive for abdominal pain and vomiting.  All other systems reviewed and are negative.  Physical Exam Updated Vital Signs BP 128/65 (BP Location: Left Arm) Comment (BP Location): Must take BP in the Left due to the right arm because to breast cancer on the right side.  Pulse 70   Temp 97.6 F (36.4 C) (Oral)   Resp 18   Ht 5' 1.75" (1.568 m)   Wt 63.5 kg   SpO2 99%   BMI 25.81 kg/m   Physical Exam Vitals and nursing note reviewed.  Constitutional:      General: She is not in acute  distress.    Appearance: She is well-developed. She is not diaphoretic.  HENT:     Head: Normocephalic and atraumatic.  Cardiovascular:     Rate and Rhythm: Normal rate and regular rhythm.     Heart sounds: No murmur heard.   No friction rub. No gallop.  Pulmonary:     Effort: Pulmonary effort is normal. No respiratory distress.     Breath sounds: Normal breath sounds. No wheezing.  Abdominal:     General: Bowel sounds are normal. There is no distension.     Palpations: Abdomen is soft.     Tenderness: There is abdominal tenderness in the epigastric area. There is no right CVA tenderness, left CVA tenderness, guarding or rebound.  Musculoskeletal:        General: Normal range of motion.     Cervical back: Normal range of motion and neck supple.  Skin:    General: Skin is warm and dry.  Neurological:     General: No focal deficit present.     Mental Status: She is alert and oriented to person, place, and time.    ED Results / Procedures / Treatments   Labs (all labs ordered are listed, but only abnormal results are displayed) Labs Reviewed  COMPREHENSIVE METABOLIC PANEL - Abnormal; Notable for the following components:      Result Value   Potassium 3.3 (*)    CO2 21 (*)    Glucose, Bld 112 (*)    All other components within normal limits  URINALYSIS, ROUTINE W REFLEX MICROSCOPIC - Abnormal; Notable for the following components:   Specific Gravity, Urine 1.004 (*)    pH 9.0 (*)    All other components  within normal limits  CBC WITH DIFFERENTIAL/PLATELET  LIPASE, BLOOD    EKG None  Radiology CT Abdomen Pelvis W Contrast  Result Date: 07/19/2021 CLINICAL DATA:  Acute abdominal pain. EXAM: CT ABDOMEN AND PELVIS WITH CONTRAST TECHNIQUE: Multidetector CT imaging of the abdomen and pelvis was performed using the standard protocol following bolus administration of intravenous contrast. CONTRAST:  84mL OMNIPAQUE IOHEXOL 350 MG/ML SOLN COMPARISON:  CT abdomen and pelvis  08/25/2019. FINDINGS: Lower chest: No acute abnormality. Hepatobiliary: No focal liver abnormality is seen. Status post cholecystectomy. No biliary dilatation. Pancreas: Unremarkable. No pancreatic ductal dilatation or surrounding inflammatory changes. Spleen: Normal in size without focal abnormality. Adrenals/Urinary Tract: There is some scarring in the superior pole the right kidney. Kidneys otherwise appear within normal limits. Adrenal glands and bladder are within normal limits. Stomach/Bowel: Stomach is within normal limits. Appendix is not seen. No evidence of bowel wall thickening, distention, or inflammatory changes. Vascular/Lymphatic: Aortic atherosclerosis. No enlarged abdominal or pelvic lymph nodes. Reproductive: Uterus and bilateral adnexa are unremarkable. Other: No abdominal wall hernia or abnormality. There is no ascites or free air. There is a small fat containing umbilical hernia and a small fat containing ventral hernia left of midline inferior to the umbilicus. These are unchanged. Musculoskeletal: Degenerative changes affect the spine IMPRESSION: 1. No acute localizing process in the abdomen or pelvis. 2. Stable fat containing ventral hernia and fat containing umbilical hernia. 3. Aortic Atherosclerosis (ICD10-I70.0). Electronically Signed   By: Ronney Asters M.D.   On: 07/19/2021 20:49    Procedures Procedures   Medications Ordered in ED Medications  iohexol (OMNIPAQUE) 350 MG/ML injection 80 mL (80 mLs Intravenous Contrast Given 07/19/21 2034)    ED Course  I have reviewed the triage vital signs and the nursing notes.  Pertinent labs & imaging results that were available during my care of the patient were reviewed by me and considered in my medical decision making (see chart for details).    MDM Rules/Calculators/A&P  Patient presenting here with complaints of epigastric pain that I suspect is related to gastritis.  She has been on antacid medications in the past, however  has been off of these for over 6 months.  Patient's work-up here is unremarkable.  She has no white count, normal LFTs, normal lipase, and CT scan that shows no acute process.  At this point, I feel as though patient can safely be discharged.  She will be started on Protonix and Carafate.  She does have a GI doctor she can follow-up with if not improving.  She understands to return if symptoms worsen or change.  Final Clinical Impression(s) / ED Diagnoses Final diagnoses:  None    Rx / DC Orders ED Discharge Orders     None        Veryl Speak, MD 07/20/21 0030

## 2021-07-21 DIAGNOSIS — M7711 Lateral epicondylitis, right elbow: Secondary | ICD-10-CM | POA: Diagnosis not present

## 2021-07-25 ENCOUNTER — Encounter: Payer: Self-pay | Admitting: Gastroenterology

## 2021-07-25 ENCOUNTER — Ambulatory Visit (INDEPENDENT_AMBULATORY_CARE_PROVIDER_SITE_OTHER): Payer: Medicare Other | Admitting: Gastroenterology

## 2021-07-25 VITALS — BP 127/70 | HR 73 | Ht 61.0 in | Wt 134.0 lb

## 2021-07-25 DIAGNOSIS — R1013 Epigastric pain: Secondary | ICD-10-CM | POA: Diagnosis not present

## 2021-07-25 NOTE — Progress Notes (Signed)
[name] Progress Note  Chief Complaint: Epigastric pain  Subjective  History: Destiny Moody was last seen in October 2020 for colonoscopy after CT scan revealed possible rectal abnormality.  Colonoscopy normal, as it had been in September 2017 after similar CT scan finding. She was in the ED on 07/19/2021 with about 2 weeks of intermittent postprandial upper abdominal pain bloating and belching.  Work-up unrevealing, prescribed Protonix and Carafate at office follow-up arranged.  It was somewhat difficult to get a clear description of the symptoms, but Destiny Moody says that a few months ago she was having some intermittent diarrhea that eventually resolved.  Several weeks ago she began having upper abdominal pain with belching that she perceived as reflux.  She was under a lot of stress related to caring for her daughter around the time of having her first child.  With that she was also having bloating and visible distention and at times her stomach was "hard" Currently taking pantoprazole but not Carafate. Denies dysphagia or odynophagia No new meds including diabetic meds prior to onset of digestive symptoms. She does not recall ever having been tested or treated for H. pylori, though she thinks she may have had an upper endoscopy in the distant past. ROS: Cardiovascular:  no chest pain Respiratory: no dyspnea Remainder of systems negative except as above The patient's Past Medical, Family and Social History were reviewed and are on file in the EMR. Past Medical History:  Diagnosis Date   Allergy    Arthritis    Breast cancer ([place]) 08/24/14   right, upper inner   Cancer ([place])    Chronic kidney disease    Depression    no meds   HSV (herpes simplex virus) anogenital infection 2018   Hypercholesterolemia    Hypertension    Hypothyroidism    Multinodular thyroid    NIDDM (non-insulin dependent diabetes mellitus)    Osteoporosis 2020   T score -2.5 stable from prior DEXA    Radiation 01/31/15-03/01/15   right  breast   Rheumatoid arthritis(714.0)    lower back   Wears contact lenses    Past Surgical History:  Procedure Laterality Date   BREAST LUMPECTOMY Right 2016   BREAST SURGERY     Lumpectomy   CESAREAN SECTION  7341,9379   x 2   CHOLECYSTECTOMY  1993   COLONOSCOPY  last 06/15/2016   greater than 12 years but not sure where colonoscopy was performed   INCISION AND DRAINAGE ABSCESS Right 11/24/2014   Procedure: INCISION AND DRAINAGE RIGHT AXILLA  ABSCESS;  Surgeon: Fanny Skates, MD;  Location: [facility];  Service: General;  Laterality: Right;   RADIOACTIVE SEED GUIDED PARTIAL MASTECTOMY WITH AXILLARY SENTINEL LYMPH NODE BIOPSY Right 10/12/2014   Procedure: RIGHT  PARTIAL MASTECTOMY WITH RADIOACTIVE SEED LOCALIZATION  RIGHT  AXILLARY SENTINEL  NODE BIOPSY;  Surgeon: Fanny Skates, MD;  Location: [facility];  Service: General;  Laterality: Right;   repair of lateral epicondylitis of right humerus  01/15/2021   TUBAL LIGATION  1996   re annastomosis   WISDOM TOOTH EXTRACTION      Objective:  Med list reviewed  Current Outpatient Medications:    amitriptyline (ELAVIL) 25 MG tablet, TAKE ONE TABLET BY MOUTH AT BEDTIME  "OFFICE VISIT NEEDED" (Patient taking differently: Take 25 mg by mouth at bedtime as needed for sleep.), Disp: 90 tablet, Rfl: 3   Blood Glucose Monitoring Suppl (ONETOUCH VERIO FLEX SYSTEM) w/Device KIT, , Disp: , Rfl:  Calcium Carbonate-Vitamin D 600-200 MG-UNIT TABS, Take 1 tablet by mouth daily. , Disp: , Rfl:    glimepiride (AMARYL) 2 MG tablet, Take 2 mg by mouth daily before breakfast., Disp: , Rfl:    Glucosamine 500 MG CAPS, Take 1 capsule by mouth daily., Disp: , Rfl:    hydrocortisone (ANUSOL-HC) 2.5 % rectal cream, Place 1 application rectally 2 (two) times daily. Make sure cream is inside. (Patient taking differently: Place 1 application rectally daily as needed for hemorrhoids. Make sure cream is inside.),  Disp: 30 g, Rfl: 1   ibandronate (BONIVA) 150 MG tablet, Take 1 tablet (150 mg total) by mouth every 30 (thirty) days. Take in the morning with a full glass of water, on an empty stomach, and do not take anything else by mouth or lie down for the next 30 min., Disp: 3 tablet, Rfl: 2   liraglutide (VICTOZA) 18 MG/3ML SOPN, Inject 1.2 mg into the skin daily., Disp: , Rfl:    lisinopril (ZESTRIL) 2.5 MG tablet, Take 2.5 mg by mouth daily., Disp: , Rfl:    metFORMIN (GLUCOPHAGE-XR) 500 MG 24 hr tablet, TAKE TWO TABLETS BY MOUTH TWICE DAILY (Patient taking differently: Take 1,000 mg by mouth in the morning and at bedtime.), Disp: 360 tablet, Rfl: 0   Multiple Vitamin (MULTIVITAMIN) capsule, Take 1 capsule by mouth daily., Disp: , Rfl:    Omega-3 Fatty Acids (OMEGA-3 FISH OIL PO), Take 1 capsule by mouth daily. , Disp: , Rfl:    oxybutynin (DITROPAN) 5 MG tablet, Take 1 tablet (5 mg total) by mouth 2 (two) times daily., Disp: 60 tablet, Rfl: 0   pantoprazole (PROTONIX) 20 MG tablet, Take 1 tablet (20 mg total) by mouth 2 (two) times daily before a meal., Disp: 40 tablet, Rfl: 1   simvastatin (ZOCOR) 20 MG tablet, Take 1 tablet (20 mg total) by mouth at bedtime., Disp: 90 tablet, Rfl: 3   traMADol (ULTRAM) 50 MG tablet, Take 1 tablet (50 mg total) by mouth every 6 (six) hours as needed., Disp: 10 tablet, Rfl: 0   triamcinolone cream (KENALOG) 0.1 %, Apply 1 application topically daily as needed (dry skin). , Disp: , Rfl:    TURMERIC PO, Take 1 capsule by mouth daily., Disp: , Rfl:    Vital signs in last 24 hrs: Vitals:   07/25/21 1023  BP: 127/70  Pulse: 73  SpO2: 100%   Wt Readings from Last 3 Encounters:  07/25/21 134 lb (60.8 kg)  07/19/21 140 lb (63.5 kg)  02/27/21 140 lb (63.5 kg)    Physical Exam  Well-appearing HEENT: sclera anicteric, oral mucosa moist without lesions Neck: supple, no thyromegaly, JVD or lymphadenopathy Cardiac: RRR without murmurs, S1S2 heard, no peripheral  edema Pulm: clear to auscultation bilaterally, normal RR and effort noted Abdomen: soft, epigastric tenderness, with active bowel sounds. No guarding or palpable hepatosplenomegaly. Skin; warm and dry, no jaundice or rash  Labs:  CBC Latest Ref Rng & Units 07/19/2021 10/06/2020 07/29/2020  WBC 4.0 - 10.5 K/uL 8.6 8.1 7.0  Hemoglobin 12.0 - 15.0 g/dL 13.4 13.4 12.8  Hematocrit 36.0 - 46.0 % 39.8 40.3 38.0  Platelets 150 - 400 K/uL 285 305 244   CMP Latest Ref Rng & Units 07/19/2021 10/06/2020 07/29/2020  Glucose 70 - 99 mg/dL 112(H) 127(H) 163(H)  BUN 8 - 23 mg/dL _0 Creatinine 0.44 - 1.00 mg/dL 0.70 0.74 0.77  Sodium 135 - 145 mmol/L 138 133(L) 140  Potassium 3.5 -  5.1 mmol/L 3.3(L) 3.5 4.1  Chloride 98 - 111 mmol/L 105 103 107  CO2 22 - 32 mmol/L 21(L) 19(L) 24  Calcium 8.9 - 10.3 mg/dL 9.9 9.8 9.2  Total Protein 6.5 - 8.1 g/dL 7.5 - 7.0  Total Bilirubin 0.3 - 1.2 mg/dL 0.9 - 0.6  Alkaline Phos 38 - 126 U/L 45 - 61  AST 15 - 41 U/L 21 - 17  ALT 0 - 44 U/L 19 - 17   Lipase normal at 43   ___________________________________________ Radiologic studies:  CLINICAL DATA:  Acute abdominal pain.   EXAM: CT ABDOMEN AND PELVIS WITH CONTRAST   TECHNIQUE: Multidetector CT imaging of the abdomen and pelvis was performed using the standard protocol following bolus administration of intravenous contrast.   CONTRAST:  48m OMNIPAQUE IOHEXOL 350 MG/ML SOLN   COMPARISON:  CT abdomen and pelvis 08/25/2019.   FINDINGS: Lower chest: No acute abnormality.   Hepatobiliary: No focal liver abnormality is seen. Status post cholecystectomy. No biliary dilatation.   Pancreas: Unremarkable. No pancreatic ductal dilatation or surrounding inflammatory changes.   Spleen: Normal in size without focal abnormality.   Adrenals/Urinary Tract: There is some scarring in the superior pole the right kidney. Kidneys otherwise appear within normal limits. Adrenal glands and bladder are within  normal limits.   Stomach/Bowel: Stomach is within normal limits. Appendix is not seen. No evidence of bowel wall thickening, distention, or inflammatory changes.   Vascular/Lymphatic: Aortic atherosclerosis. No enlarged abdominal or pelvic lymph nodes.   Reproductive: Uterus and bilateral adnexa are unremarkable.   Other: No abdominal wall hernia or abnormality. There is no ascites or free air. There is a small fat containing umbilical hernia and a small fat containing ventral hernia left of midline inferior to the umbilicus. These are unchanged.   Musculoskeletal: Degenerative changes affect the spine   IMPRESSION: 1. No acute localizing process in the abdomen or pelvis. 2. Stable fat containing ventral hernia and fat containing umbilical hernia. 3. Aortic Atherosclerosis (ICD10-I70.0).     Electronically Signed   By: ARonney AstersM.D.   On: 07/19/2021 20:49  ____________________________________________ Other:   _____________________________________________ Assessment & Plan  Assessment: Encounter Diagnosis  Name Primary?   Epigastric pain Yes   Recent onset upper abdominal pain with associated belching and bloating.  Possible H. pylori, ulcer, less likely malignancy since she looks well and no findings on CT scan. No evidence of pancreatic or biliary disease, LFTs normal.  She said at times the pain felt reminiscent of her gallbladder, though less likely to be CBD stone with normal LFTs.  Plan: H. pylori antibody  Upper endoscopy.  She was agreeable after discussion of procedure and risks.  The benefits and risks of the planned procedure were described in detail with the patient or (when appropriate) their health care proxy.  Risks were outlined as including, but not limited to, bleeding, infection, perforation, adverse medication reaction leading to cardiac or pulmonary decompensation, pancreatitis (if ERCP).  The limitation of incomplete mucosal visualization was  also discussed.  No guarantees or warranties were given.  She was planning to check with her insurance about the projected cost.  Meanwhile, continue pantoprazole since it seems to be helping her "a little".  HNelida MeuseIII

## 2021-07-25 NOTE — Patient Instructions (Addendum)
If you are age 68 or older, your body mass index should be between 23-30. Your Body mass index is 25.32 kg/m. If this is out of the aforementioned range listed, please consider follow up with your Primary Care Provider.  If you are age 37 or younger, your body mass index should be between 19-25. Your Body mass index is 25.32 kg/m. If this is out of the aformentioned range listed, please consider follow up with your Primary Care Provider.   ________________________________________________________  The [facility] providers would like to encourage you to use [facility] to communicate with providers for non-urgent requests or questions.  Due to long hold times on the telephone, sending your provider a message by [facility] may be a faster and more efficient way to get a response.  Please allow 48 business hours for a response.  Please remember that this is for non-urgent requests.  _______________________________________________________  Dennis Bast have been scheduled for an endoscopy. Please follow written instructions given to you at your visit today. If you use inhalers (even only as needed), please bring them with you on the day of your procedure.  Your provider has requested that you go to the basement level for lab work before leaving today. Press "B" on the elevator. The lab is located at the first door on the left as you exit the elevator.  Due to recent changes in healthcare laws, you may see the results of your imaging and laboratory studies on MyChart before your provider has had a chance to review them.  We understand that in some cases there may be results that are confusing or concerning to you. Not all laboratory results come back in the same time frame and the provider may be waiting for multiple results in order to interpret others.  Please give [place] 48 hours in order for your provider to thoroughly review all the results before contacting the office for clarification of your results.    It was a  pleasure to see you today!  Thank you for trusting me with your gastrointestinal care!

## 2021-07-27 DIAGNOSIS — R1013 Epigastric pain: Secondary | ICD-10-CM | POA: Diagnosis not present

## 2021-07-27 DIAGNOSIS — Z8673 Personal history of transient ischemic attack (TIA), and cerebral infarction without residual deficits: Secondary | ICD-10-CM | POA: Diagnosis not present

## 2021-07-27 DIAGNOSIS — E041 Nontoxic single thyroid nodule: Secondary | ICD-10-CM | POA: Diagnosis not present

## 2021-07-27 DIAGNOSIS — E1169 Type 2 diabetes mellitus with other specified complication: Secondary | ICD-10-CM | POA: Diagnosis not present

## 2021-07-27 DIAGNOSIS — E7800 Pure hypercholesterolemia, unspecified: Secondary | ICD-10-CM | POA: Diagnosis not present

## 2021-07-28 ENCOUNTER — Other Ambulatory Visit: Payer: Self-pay | Admitting: Family Medicine

## 2021-07-28 DIAGNOSIS — R202 Paresthesia of skin: Secondary | ICD-10-CM

## 2021-07-28 DIAGNOSIS — E041 Nontoxic single thyroid nodule: Secondary | ICD-10-CM

## 2021-07-28 DIAGNOSIS — Z8673 Personal history of transient ischemic attack (TIA), and cerebral infarction without residual deficits: Secondary | ICD-10-CM

## 2021-07-28 DIAGNOSIS — R2990 Unspecified symptoms and signs involving the nervous system: Secondary | ICD-10-CM

## 2021-08-02 ENCOUNTER — Other Ambulatory Visit: Payer: Self-pay

## 2021-08-02 ENCOUNTER — Other Ambulatory Visit: Payer: Self-pay | Admitting: Family Medicine

## 2021-08-02 ENCOUNTER — Other Ambulatory Visit: Payer: Self-pay | Admitting: *Deleted

## 2021-08-02 DIAGNOSIS — R202 Paresthesia of skin: Secondary | ICD-10-CM

## 2021-08-02 DIAGNOSIS — Z170 Estrogen receptor positive status [ER+]: Secondary | ICD-10-CM

## 2021-08-02 DIAGNOSIS — Z8673 Personal history of transient ischemic attack (TIA), and cerebral infarction without residual deficits: Secondary | ICD-10-CM

## 2021-08-02 DIAGNOSIS — C50211 Malignant neoplasm of upper-inner quadrant of right female breast: Secondary | ICD-10-CM

## 2021-08-02 DIAGNOSIS — R2990 Unspecified symptoms and signs involving the nervous system: Secondary | ICD-10-CM

## 2021-08-02 NOTE — Progress Notes (Signed)
[facility]  Telephone:([phone]) [phone] [phone]) [phone]     ID: [name] DOB: 07/06/1951  MR#: [id number]  UKR#:[id number]  Patient Care Team: Caren Macadam, MD as PCP - General (Family Medicine) Bettyann Birchler, Virgie Dad, MD as Consulting Physician (Oncology) Fontaine, Belinda Block, MD (Inactive) as Consulting Physician (Gynecology) Robley Fries, MD as Consulting Physician (Urology) Susa Day, MD as Consulting Physician (Orthopedic Surgery) Danis, Kirke Corin, MD as Consulting Physician (Gastroenterology) OTHER MD:  CHIEF COMPLAINT: Estrogen receptor positive breast cancer  CURRENT TREATMENT: Observation   INTERVAL HISTORY: Destiny Moody returns today for follow-up of her estrogen receptor positive breast cancer. She is now under observation.  Since her last visit here she underwent bilateral screening mammography at the [facility] 07/15/2021 showing breast density category B.  There was no evidence of malignancy.  She also had a CT scan of the abdomen and pelvis with contrast 07/19/2021 for evaluation of acute abdominal pain.  This showed no abnormality of concern, no evidence of metastatic disease.  There was evidence of aortic atherosclerosis.  REVIEW OF SYSTEMS: Destiny Moody tells me her fifth grandson was born 06/30/2021.  This was a very difficult pregnancy and she had significant problems with her daughter, the baby's mother.  She thinks that really accounts for the worsening of her reflux problems.  She is now taking omeprazole and that is much better.  Aside from that a detailed review of systems today was noncontributory   COVID 19 VACCINATION STATUS: fully vaccinated [employer]) with the booster November 2021   BREAST CANCER HISTORY: From Dr. Ernestina Penna intake note dated 01/17/2015:  "She had abnormal screening mammogram in September 2015, underwent biopsy on 06/15/2014 which was negative for malignancy. She developed I small hematoma after biopsy, which required  drainage afterwards. She finally had a bilateral breast MRI , which showed a 1.7 cm non-mass enhancement in the medial right breast. She had repeated mammogram on 08/19/2014 which showed again non-mass like enhancement in the right breast which was biopsied before.   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 She tolerates the biopsy well without any complications "  Her subsequent history is as detailed below.   PAST MEDICAL HISTORY: Past Medical History:  Diagnosis Date   Allergy    Arthritis    Breast cancer ([facility]) 08/24/14   right, upper inner   Cancer ([place])    Chronic kidney disease    Depression    no meds   HSV (herpes simplex virus) anogenital infection 2018   Hypercholesterolemia    Hypertension    Hypothyroidism    Multinodular thyroid    NIDDM (non-insulin dependent diabetes mellitus)    Osteoporosis 2020   T score -2.5 stable from prior DEXA   Radiation 01/31/15-03/01/15   right  breast   Rheumatoid arthritis(714.0)    lower back   Wears contact lenses     PAST SURGICAL HISTORY: Past Surgical History:  Procedure Laterality Date   BREAST LUMPECTOMY Right 2016   BREAST SURGERY     Lumpectomy   CESAREAN SECTION  5436,0677   x 2   CHOLECYSTECTOMY  1993   COLONOSCOPY  last 06/15/2016   greater than 12 years but not sure where colonoscopy was performed   INCISION AND DRAINAGE ABSCESS Right 11/24/2014   Procedure: INCISION AND DRAINAGE RIGHT AXILLA  ABSCESS;  Surgeon: Fanny Skates, MD;  Location: [facility];  Service: General;  Laterality: Right;   RADIOACTIVE SEED GUIDED  PARTIAL MASTECTOMY WITH AXILLARY SENTINEL LYMPH NODE BIOPSY Right 10/12/2014   Procedure: RIGHT  PARTIAL MASTECTOMY WITH RADIOACTIVE SEED LOCALIZATION  RIGHT  AXILLARY SENTINEL  NODE BIOPSY;  Surgeon: Fanny Skates, MD;  Location: [facility];  Service: General;  Laterality: Right;   repair of lateral epicondylitis of  right humerus  01/15/2021   TUBAL LIGATION  1996   re annastomosis   WISDOM TOOTH EXTRACTION      FAMILY HISTORY Family History  Problem Relation Age of Onset   Diabetes Mother        niddm   Heart disease Mother    Heart failure Father    Heart disease Father    Ovarian cancer Cousin 37   Hyperlipidemia Sister    Colon cancer Maternal Uncle 53   Esophageal cancer Neg Hx    Rectal cancer Neg Hx    Stomach cancer Neg Hx    Colon polyps Neg Hx    the patient's father died from a myocardial infarction at age 57. The patient's mother died at age 1 from complications of diabetes. The patient had no brothers, one sister. There is no history of cancer in the immediate family but the patient has one cousin on the mother's side diagnosed with ovarian cancer before the age of 84. Also that cousin's father, the patient's mother's brother, was diagnosed with colon cancer at age 68   GYNECOLOGIC HISTORY:  No LMP recorded. Patient is postmenopausal. Menarche age 47, first live birth age 77. She is GX P2. She went through the change of life approximately age 70. 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 Her husband [name] is in the rental business. The patient's daughter Lajean Manes works at [employer] in [place], and daughter Elray Mcgregor works at [employer]. The patient has 4 grandchildren. She attends our [place]: In the absence of any documentation to the contrary, the patient's spouse is their HCPOA.    HEALTH MAINTENANCE: Social History   Tobacco Use   Smoking status: Never   Smokeless tobacco: Never  Vaping Use   Vaping Use: Never used  Substance Use Topics   Alcohol use: Not Currently    Alcohol/week: 0.0 standard drinks   Drug use: No     Colonoscopy: September 2017  PAP: 02/02/2019, normal  Bone density: Osteoporosis  Lipid panel:  Allergies  Allergen  Reactions   Shellfish Allergy Hives    unsure    Current Outpatient Medications  Medication Sig Dispense Refill   amitriptyline (ELAVIL) 25 MG tablet TAKE ONE TABLET BY MOUTH AT BEDTIME  "OFFICE VISIT NEEDED" (Patient taking differently: Take 25 mg by mouth at bedtime as needed for sleep.) 90 tablet 3   Blood Glucose Monitoring Suppl (ONETOUCH VERIO FLEX SYSTEM) w/Device KIT      Calcium Carbonate-Vitamin D 600-200 MG-UNIT TABS Take 1 tablet by mouth daily.      glimepiride (AMARYL) 2 MG tablet Take 2 mg by mouth daily before breakfast.     Glucosamine 500 MG CAPS Take 1 capsule by mouth daily.     hydrocortisone (ANUSOL-HC) 2.5 % rectal cream Place 1 application rectally 2 (two) times daily. Make sure cream is inside. (Patient taking differently: Place 1 application rectally daily as needed for hemorrhoids. Make sure cream is inside.) 30 g 1   ibandronate (BONIVA) 150 MG tablet Take 1 tablet (150 mg  total) by mouth every 30 (thirty) days. Take in the morning with a full glass of water, on an empty stomach, and do not take anything else by mouth or lie down for the next 30 min. 3 tablet 2   liraglutide (VICTOZA) 18 MG/3ML SOPN Inject 1.2 mg into the skin daily.     lisinopril (ZESTRIL) 2.5 MG tablet Take 2.5 mg by mouth daily.     metFORMIN (GLUCOPHAGE-XR) 500 MG 24 hr tablet TAKE TWO TABLETS BY MOUTH TWICE DAILY (Patient taking differently: Take 1,000 mg by mouth in the morning and at bedtime.) 360 tablet 0   Multiple Vitamin (MULTIVITAMIN) capsule Take 1 capsule by mouth daily.     Omega-3 Fatty Acids (OMEGA-3 FISH OIL PO) Take 1 capsule by mouth daily.      oxybutynin (DITROPAN) 5 MG tablet Take 1 tablet (5 mg total) by mouth 2 (two) times daily. 60 tablet 0   pantoprazole (PROTONIX) 20 MG tablet Take 1 tablet (20 mg total) by mouth 2 (two) times daily before a meal. 40 tablet 1   simvastatin (ZOCOR) 20 MG tablet Take 1 tablet (20 mg total) by mouth at bedtime. 90 tablet 3   traMADol  (ULTRAM) 50 MG tablet Take 1 tablet (50 mg total) by mouth every 6 (six) hours as needed. 10 tablet 0   triamcinolone cream (KENALOG) 0.1 % Apply 1 application topically daily as needed (dry skin).      TURMERIC PO Take 1 capsule by mouth daily.     No current facility-administered medications for this visit.    OBJECTIVE: Latin woman in no acute distress  Vitals:   08/03/21 1132  BP: 117/60  Pulse: 80  Resp: 18  Temp: (!) 97.4 F (36.3 C)  SpO2: 100%      Body mass index is 25.39 kg/m.    ECOG FS:1 - Symptomatic but completely ambulatory  Sclerae unicteric, EOMs intact Wearing a mask No cervical or supraclavicular adenopathy Lungs no rales or rhonchi Heart regular rate and rhythm Abd soft, nontender, positive bowel sounds MSK no focal spinal tenderness, no upper extremity lymphedema Neuro: nonfocal, well oriented, appropriate affect Breasts: The right breast is status postlumpectomy and radiation.  There is no evidence of disease recurrence per the left breast and both axillae are benign.   LAB RESULTS:  CMP     Component Value Date/Time   NA 138 07/19/2021 1842   NA 139 02/26/2017 0956   K 3.3 (L) 07/19/2021 1842   K 4.0 02/26/2017 0956   CL 105 07/19/2021 1842   CO2 21 (L) 07/19/2021 1842   CO2 25 02/26/2017 0956   GLUCOSE 112 (H) 07/19/2021 1842   GLUCOSE 252 (H) 02/26/2017 0956   BUN 17 07/19/2021 1842   BUN 19.7 02/26/2017 0956   CREATININE 0.70 07/19/2021 1842   CREATININE 0.77 07/29/2020 1049   CREATININE 0.8 02/26/2017 0956   CALCIUM 9.9 07/19/2021 1842   CALCIUM 9.4 02/26/2017 0956   PROT 7.5 07/19/2021 1842   PROT 7.0 02/26/2017 0956   ALBUMIN 4.3 07/19/2021 1842   ALBUMIN 3.7 02/26/2017 0956   AST 21 07/19/2021 1842   AST 17 07/29/2020 1049   AST 20 02/26/2017 0956   ALT 19 07/19/2021 1842   ALT 17 07/29/2020 1049   ALT 24 02/26/2017 0956   ALKPHOS 45 07/19/2021 1842   ALKPHOS 61 02/26/2017 0956   BILITOT 0.9 07/19/2021 1842   BILITOT 0.6  07/29/2020 1049   BILITOT 0.44 02/26/2017 0956  GFRNONAA >60 07/19/2021 1842   GFRNONAA >60 07/29/2020 1049   GFRNONAA 89 09/27/2015 2045   GFRAA >60 02/04/2020 0858   GFRAA >60 02/26/2019 1133   GFRAA >89 09/27/2015 2045    INo results found for: SPEP, UPEP  Lab Results  Component Value Date   WBC 8.6 07/19/2021   NEUTROABS 4.8 07/19/2021   HGB 13.4 07/19/2021   HCT 39.8 07/19/2021   MCV 92.6 07/19/2021   PLT 285 07/19/2021      Chemistry      Component Value Date/Time   NA 138 07/19/2021 1842   NA 139 02/26/2017 0956   K 3.3 (L) 07/19/2021 1842   K 4.0 02/26/2017 0956   CL 105 07/19/2021 1842   CO2 21 (L) 07/19/2021 1842   CO2 25 02/26/2017 0956   BUN 17 07/19/2021 1842   BUN 19.7 02/26/2017 0956   CREATININE 0.70 07/19/2021 1842   CREATININE 0.77 07/29/2020 1049   CREATININE 0.8 02/26/2017 0956      Component Value Date/Time   CALCIUM 9.9 07/19/2021 1842   CALCIUM 9.4 02/26/2017 0956   ALKPHOS 45 07/19/2021 1842   ALKPHOS 61 02/26/2017 0956   AST 21 07/19/2021 1842   AST 17 07/29/2020 1049   AST 20 02/26/2017 0956   ALT 19 07/19/2021 1842   ALT 17 07/29/2020 1049   ALT 24 02/26/2017 0956   BILITOT 0.9 07/19/2021 1842   BILITOT 0.6 07/29/2020 1049   BILITOT 0.44 02/26/2017 0956       No results found for: LABCA2  No components found for: LABCA125  No results for input(s): INR in the last 168 hours.  Urinalysis    Component Value Date/Time   COLORURINE YELLOW 07/19/2021 1930   APPEARANCEUR CLEAR 07/19/2021 1930   LABSPEC 1.004 (L) 07/19/2021 1930   PHURINE 9.0 (H) 07/19/2021 1930   GLUCOSEU NEGATIVE 07/19/2021 1930   HGBUR NEGATIVE 07/19/2021 1930   [place] NEGATIVE 07/19/2021 1930   BILIRUBINUR negative 09/27/2015 2103   KETONESUR NEGATIVE 07/19/2021 1930   PROTEINUR NEGATIVE 07/19/2021 1930   UROBILINOGEN 0.2 09/27/2015 2103   UROBILINOGEN 0.2 04/19/2008 1937   NITRITE NEGATIVE 07/19/2021 1930   LEUKOCYTESUR NEGATIVE 07/19/2021  1930    STUDIES: CT Abdomen Pelvis W Contrast  Result Date: 07/19/2021 CLINICAL DATA:  Acute abdominal pain. EXAM: CT ABDOMEN AND PELVIS WITH CONTRAST TECHNIQUE: Multidetector CT imaging of the abdomen and pelvis was performed using the standard protocol following bolus administration of intravenous contrast. CONTRAST:  14mL OMNIPAQUE IOHEXOL 350 MG/ML SOLN COMPARISON:  CT abdomen and pelvis 08/25/2019. FINDINGS: Lower chest: No acute abnormality. Hepatobiliary: No focal liver abnormality is seen. Status post cholecystectomy. No biliary dilatation. Pancreas: Unremarkable. No pancreatic ductal dilatation or surrounding inflammatory changes. Spleen: Normal in size without focal abnormality. Adrenals/Urinary Tract: There is some scarring in the superior pole the right kidney. Kidneys otherwise appear within normal limits. Adrenal glands and bladder are within normal limits. Stomach/Bowel: Stomach is within normal limits. Appendix is not seen. No evidence of bowel wall thickening, distention, or inflammatory changes. Vascular/Lymphatic: Aortic atherosclerosis. No enlarged abdominal or pelvic lymph nodes. Reproductive: Uterus and bilateral adnexa are unremarkable. Other: No abdominal wall hernia or abnormality. There is no ascites or free air. There is a small fat containing umbilical hernia and a small fat containing ventral hernia left of midline inferior to the umbilicus. These are unchanged. Musculoskeletal: Degenerative changes affect the spine IMPRESSION: 1. No acute localizing process in the abdomen or pelvis. 2. Stable fat containing ventral  hernia and fat containing umbilical hernia. 3. Aortic Atherosclerosis (ICD10-I70.0). Electronically Signed   By: Ronney Asters M.D.   On: 07/19/2021 20:49   MM 3D SCREEN BREAST BILATERAL  Result Date: 07/17/2021 CLINICAL DATA:  Screening. History of RIGHT breast cancer, lumpectomy and treatment in 2015 EXAM: DIGITAL SCREENING BILATERAL MAMMOGRAM WITH TOMOSYNTHESIS  AND CAD TECHNIQUE: Bilateral screening digital craniocaudal and mediolateral oblique mammograms were obtained. Bilateral screening digital breast tomosynthesis was performed. The images were evaluated with computer-aided detection. COMPARISON:  Previous exam(s). ACR Breast Density Category b: There are scattered areas of fibroglandular density. FINDINGS: There are no findings suspicious for malignancy. RIGHT breast surgical and treatment changes again noted. IMPRESSION: No mammographic evidence of malignancy. A result letter of this screening mammogram will be mailed directly to the patient. RECOMMENDATION: Screening mammogram in one year. (Code:SM-B-01Y) BI-RADS CATEGORY  2: Benign. Electronically Signed   By: Margarette Canada M.D.   On: 07/17/2021 15:20      ASSESSMENT: 70 y.o. BRCA negative [place] woman status post right breast upper inner quadrant biopsy 08/24/2014 for a clinical T1c N0, stage IA invasive ductal carcinoma, grade 1 or 2, strongly estrogen and progesterone receptor positive, with an MIB-1 of 12%, and no HER-2 amplification  (1) status post right lumpectomy and sentinel lymph node sampling 10/12/2014 for a pT1c pN0, stage IA invasive ductal carcinoma, grade 1, with ample margins; repeat HER-2 again negative  (a) Oncotype DX score of 14 predicts a 10 year outside the breast recurrence rate of 9% of the patient's only systemic therapy is tamoxifen for 5 years.  (2) Adjuvant radiation 01/31/2015-03/01/2015:    Right breast/ 42.72 Gy at 2.67 Gy per fraction x 21 fractions.   Right breast boost/ 10 Gy at 2 Gy per fraction x 5 fractions  (3) started tamoxifen 05/19/2015, completing 5 years July 2021  (4) genetic testing 04/26/2015 through the [employer] gene panel offered by [employer] found no deleterious mutations in ATM, BARD1, BRCA1, BRCA2, BRIP1, CDH1, CHEK2, EPCAM, MLH1, MRE11A, MSH2, MSH6, MUTYH, NBN, NF1, PALB2, PMS2, PTEN, RAD50, RAD51C, RAD51D, SMARCA4, STK11, and TP53.   (5)  Bone density at [facility] 12/20/2016 shows osteoporosis   PLAN: Destiny Moody is coming up on 7 years from definitive surgery for her breast cancer with no evidence of disease recurrence.  This is very favorable.  At this point I feel comfortable releasing her from follow-up here.  All she will need in terms of breast cancer monitoring is her yearly mammography and a yearly physician breast exam.  We will be glad to see Destiny Moody again at any point in the future if and when the need arises but as of now are making no further routine appointments for her here.  Total encounter time 20 minutes.*  Total encounter time 25 minutes.Chauncey Cruel, MD   08/03/2021 3:51 PM Medical Oncology and Hematology [facility] [address], [place] [address] Tel. [phone]    Fax. [phone]   I, Wilburn Mylar, am acting as scribe for Dr. Virgie Dad. Sherra Kimmons.  I, Lurline Del MD, have reviewed the above documentation for accuracy and completeness, and I agree with the above.   *Total Encounter Time as defined by the Centers for Medicare and Medicaid Services includes, in addition to the face-to-face time of a patient visit (documented in the note above) non-face-to-face time: obtaining and reviewing outside history, ordering and reviewing medications, tests or procedures, care coordination (communications with other health care professionals  or caregivers) and documentation in the medical record.

## 2021-08-03 ENCOUNTER — Inpatient Hospital Stay: Payer: Medicare Other

## 2021-08-03 ENCOUNTER — Inpatient Hospital Stay: Payer: Medicare Other | Attending: Oncology | Admitting: Oncology

## 2021-08-03 ENCOUNTER — Encounter: Payer: Self-pay | Admitting: Neurology

## 2021-08-03 ENCOUNTER — Other Ambulatory Visit: Payer: Self-pay

## 2021-08-03 VITALS — BP 117/60 | HR 80 | Temp 97.4°F | Resp 18 | Ht 61.0 in | Wt 134.4 lb

## 2021-08-03 DIAGNOSIS — C50211 Malignant neoplasm of upper-inner quadrant of right female breast: Secondary | ICD-10-CM

## 2021-08-03 DIAGNOSIS — Z170 Estrogen receptor positive status [ER+]: Secondary | ICD-10-CM

## 2021-08-04 ENCOUNTER — Encounter: Payer: Self-pay | Admitting: Gastroenterology

## 2021-08-04 ENCOUNTER — Ambulatory Visit (AMBULATORY_SURGERY_CENTER): Payer: Medicare Other | Admitting: Gastroenterology

## 2021-08-04 ENCOUNTER — Telehealth: Payer: Self-pay

## 2021-08-04 VITALS — BP 119/68 | HR 78 | Temp 96.0°F | Resp 12 | Ht 61.0 in | Wt 134.0 lb

## 2021-08-04 DIAGNOSIS — E7800 Pure hypercholesterolemia, unspecified: Secondary | ICD-10-CM | POA: Insufficient documentation

## 2021-08-04 DIAGNOSIS — R202 Paresthesia of skin: Secondary | ICD-10-CM | POA: Insufficient documentation

## 2021-08-04 DIAGNOSIS — G43909 Migraine, unspecified, not intractable, without status migrainosus: Secondary | ICD-10-CM | POA: Insufficient documentation

## 2021-08-04 DIAGNOSIS — H35039 Hypertensive retinopathy, unspecified eye: Secondary | ICD-10-CM | POA: Insufficient documentation

## 2021-08-04 DIAGNOSIS — I739 Peripheral vascular disease, unspecified: Secondary | ICD-10-CM | POA: Insufficient documentation

## 2021-08-04 DIAGNOSIS — R1013 Epigastric pain: Secondary | ICD-10-CM

## 2021-08-04 DIAGNOSIS — F324 Major depressive disorder, single episode, in partial remission: Secondary | ICD-10-CM | POA: Insufficient documentation

## 2021-08-04 DIAGNOSIS — F418 Other specified anxiety disorders: Secondary | ICD-10-CM | POA: Insufficient documentation

## 2021-08-04 DIAGNOSIS — K3189 Other diseases of stomach and duodenum: Secondary | ICD-10-CM

## 2021-08-04 DIAGNOSIS — F4320 Adjustment disorder, unspecified: Secondary | ICD-10-CM | POA: Insufficient documentation

## 2021-08-04 DIAGNOSIS — E11319 Type 2 diabetes mellitus with unspecified diabetic retinopathy without macular edema: Secondary | ICD-10-CM | POA: Insufficient documentation

## 2021-08-04 DIAGNOSIS — R112 Nausea with vomiting, unspecified: Secondary | ICD-10-CM

## 2021-08-04 DIAGNOSIS — F321 Major depressive disorder, single episode, moderate: Secondary | ICD-10-CM | POA: Insufficient documentation

## 2021-08-04 DIAGNOSIS — K5900 Constipation, unspecified: Secondary | ICD-10-CM | POA: Insufficient documentation

## 2021-08-04 DIAGNOSIS — N649 Disorder of breast, unspecified: Secondary | ICD-10-CM | POA: Insufficient documentation

## 2021-08-04 MED ORDER — SODIUM CHLORIDE 0.9 % IV SOLN: 500.0000 mL | Freq: Once | INTRAVENOUS | Status: DC

## 2021-08-04 NOTE — Op Note (Signed)
[facility] Patient Name: Destiny Moody Procedure Date: 08/04/2021 10:38 AM MRN: [id number] Endoscopist: Mallie Mussel L. Loletha Carrow , MD Age: 70 Referring MD:  Date of Birth: 06/10/1951 Gender: Female Account #: [id number] Procedure:                Upper GI endoscopy Indications:              Epigastric abdominal pain, Eructation Medicines:                Monitored Anesthesia Care Procedure:                Pre-Anesthesia Assessment:                           - Prior to the procedure, a History and Physical                            was performed, and patient medications and                            allergies were reviewed. The patient's tolerance of                            previous anesthesia was also reviewed. The risks                            and benefits of the procedure and the sedation                            options and risks were discussed with the patient.                            All questions were answered, and informed consent                            was obtained. Prior Anticoagulants: The patient has                            taken no previous anticoagulant or antiplatelet                            agents. ASA Grade Assessment: III - A patient with                            severe systemic disease. After reviewing the risks                            and benefits, the patient was deemed in                            satisfactory condition to undergo the procedure.                           After obtaining informed consent, the endoscope was  passed under direct vision. Throughout the                            procedure, the patient's blood pressure, pulse, and                            oxygen saturations were monitored continuously. The                            #[id number] Olympus Endoscope was introduced through the                            mouth, and advanced to the second part of duodenum.                            The upper  GI endoscopy was accomplished without                            difficulty. The patient tolerated the procedure                            well. Scope In: Scope Out: Findings:                 The esophagus was normal.                           A medium amount of food (residue) was found in the                            gastric body. Several biopsies were obtained on the                            greater curvature of the gastric body, on the                            lesser curvature of the gastric body, on the                            greater curvature of the gastric antrum and on the                            lesser curvature of the gastric antrum with cold                            forceps for histology.                           The cardia and gastric fundus were normal on                            retroflexion.                           The examined duodenum was normal. Complications:  No immediate complications. Estimated Blood Loss:     Estimated blood loss was minimal. Impression:               - Normal esophagus.                           - A medium amount of food (residue) in the stomach.                           - Normal examined duodenum.                           - Several biopsies were obtained on the greater                            curvature of the gastric body, on the lesser                            curvature of the gastric body, on the greater                            curvature of the gastric antrum and on the lesser                            curvature of the gastric antrum. Recommendation:           - Patient has a contact number available for                            emergencies. The signs and symptoms of potential                            delayed complications were discussed with the                            patient. Return to normal activities tomorrow.                            Written discharge instructions were provided to the                             patient.                           - Resume previous diet.                           - Continue present medications.                           - Await pathology results.                           - Do a gastric emptying study at appointment to be  scheduled, with a follow up office appointment                            afterward. Alechia Lezama L. Loletha Carrow, MD 08/04/2021 10:59:19 AM This report has been signed electronically.

## 2021-08-04 NOTE — Progress Notes (Signed)
Called to room to assist during endoscopic procedure.  Patient ID and intended procedure confirmed with present staff. Received instructions for my participation in the procedure from the performing physician.  

## 2021-08-04 NOTE — Telephone Encounter (Signed)
Attempted to reach patient, unable to leave a vm at this time. Pt's vm is full. Will attempt again at a later time.  Patient is scheduled for GES at [facility] on Monday, 09/04/21 at 7:30 am. Pt will need to arrive at [facility] by 7:15 am. Hold GI meds for 8 hours. NPO after midnight.   Patient is scheduled for a follow up with Dr. Loletha Carrow on Tuesday, 09/19/21 at 11 am.

## 2021-08-04 NOTE — Progress Notes (Signed)
1042 Robinul 0.1 mg IV given due large amount of secretions upon assessment.  MD made aware, vss  

## 2021-08-04 NOTE — Progress Notes (Signed)
Pt's states no medical or surgical changes since previsit or office visit.  Vitals DT 

## 2021-08-04 NOTE — Patient Instructions (Signed)
YOU HAD AN ENDOSCOPIC PROCEDURE TODAY AT THE [facility]:   Refer to the procedure report that was given to you for any specific questions about what was found during the examination.  If the procedure report does not answer your questions, please call your gastroenterologist to clarify.  If you requested that your care partner not be given the details of your procedure findings, then the procedure report has been included in a sealed envelope for you to review at your convenience later.  YOU SHOULD EXPECT: Some feelings of bloating in the abdomen. Passage of more gas than usual.  Walking can help get rid of the air that was put into your GI tract during the procedure and reduce the bloating. If you had a lower endoscopy (such as a colonoscopy or flexible sigmoidoscopy) you may notice spotting of blood in your stool or on the toilet paper. If you underwent a bowel prep for your procedure, you may not have a normal bowel movement for a few days.  Please Note:  You might notice some irritation and congestion in your nose or some drainage.  This is from the oxygen used during your procedure.  There is no need for concern and it should clear up in a day or so.  SYMPTOMS TO REPORT IMMEDIATELY:    Following upper endoscopy (EGD)  Vomiting of blood or coffee ground material  New chest pain or pain under the shoulder blades  Painful or persistently difficult swallowing  New shortness of breath  Fever of 100F or higher  Black, tarry-looking stools  For urgent or emergent issues, a gastroenterologist can be reached at any hour by calling [phone]. Do not use MyChart messaging for urgent concerns.    DIET:  We do recommend a small meal at first, but then you may proceed to your regular diet.  Drink plenty of fluids but you should avoid alcoholic beverages for 24 hours.  ACTIVITY:  You should plan to take it easy for the rest of today and you should NOT DRIVE or use heavy machinery  until tomorrow (because of the sedation medicines used during the test).    FOLLOW UP: Our staff will call the number listed on your records 48-72 hours following your procedure to check on you and address any questions or concerns that you may have regarding the information given to you following your procedure. If we do not reach you, we will leave a message.  We will attempt to reach you two times.  During this call, we will ask if you have developed any symptoms of COVID 19. If you develop any symptoms (ie: fever, flu-like symptoms, shortness of breath, cough etc.) before then, please call [phone].  If you test positive for Covid 19 in the 2 weeks post procedure, please call and report this information to us.    If any biopsies were taken you will be contacted by phone or by letter within the next 1-3 weeks.  Please call us at [phone] if you have not heard about the biopsies in 3 weeks.    SIGNATURES/CONFIDENTIALITY: You and/or your care partner have signed paperwork which will be entered into your electronic medical record.  These signatures attest to the fact that that the information above on your After Visit Summary has been reviewed and is understood.  Full responsibility of the confidentiality of this discharge information lies with you and/or your care-partner. 

## 2021-08-04 NOTE — Progress Notes (Signed)
No changes to clinical history since GI office visit on 07/25/21.  The patient is appropriate for an endoscopic procedure in the ambulatory setting.

## 2021-08-04 NOTE — Telephone Encounter (Signed)
-----   Message from Savannah, MD sent at 08/04/2021  1:47 PM EST ----- Please arrange a gastric emptying study (epigastric pain, retained food on EGD) and a follow up clinic visit with me after that.  - HD

## 2021-08-04 NOTE — Progress Notes (Signed)
Report given to PACU, vss 

## 2021-08-08 ENCOUNTER — Telehealth: Payer: Self-pay

## 2021-08-08 ENCOUNTER — Telehealth: Payer: Self-pay | Admitting: Gastroenterology

## 2021-08-08 ENCOUNTER — Other Ambulatory Visit: Payer: Self-pay

## 2021-08-08 ENCOUNTER — Ambulatory Visit
Admission: RE | Admit: 2021-08-08 | Discharge: 2021-08-08 | Disposition: A | Discharge status: Home / Self Care | Payer: Medicare Other | Source: Ambulatory Visit | Attending: Family Medicine | Admitting: Family Medicine

## 2021-08-08 DIAGNOSIS — I10 Essential (primary) hypertension: Secondary | ICD-10-CM | POA: Diagnosis not present

## 2021-08-08 DIAGNOSIS — E119 Type 2 diabetes mellitus without complications: Secondary | ICD-10-CM | POA: Diagnosis not present

## 2021-08-08 DIAGNOSIS — R202 Paresthesia of skin: Secondary | ICD-10-CM | POA: Diagnosis not present

## 2021-08-08 DIAGNOSIS — Z8673 Personal history of transient ischemic attack (TIA), and cerebral infarction without residual deficits: Secondary | ICD-10-CM

## 2021-08-08 DIAGNOSIS — E041 Nontoxic single thyroid nodule: Secondary | ICD-10-CM | POA: Diagnosis not present

## 2021-08-08 DIAGNOSIS — E785 Hyperlipidemia, unspecified: Secondary | ICD-10-CM | POA: Diagnosis not present

## 2021-08-08 NOTE — Telephone Encounter (Signed)
  Follow up Call-  Call back number 08/04/2021 06/29/2019  Post procedure Call Back phone  # [phone]  Permission to leave phone message Yes Yes  Some recent data might be hidden     Patient questions:  Do you have a fever, pain , or abdominal swelling? No. Pain Score  0 *  Have you tolerated food without any problems? Yes.    Have you been able to return to your normal activities? Yes.    Do you have any questions about your discharge instructions: Diet   No. Medications  No. Follow up visit  No.  Do you have questions or concerns about your Care? No.  Actions: * If pain score is 4 or above: No action needed, pain <4.

## 2021-08-08 NOTE — Telephone Encounter (Signed)
Inbound call from Patient states that she is confused about the prior conversation you had with her. Please call back to advise.

## 2021-08-08 NOTE — Telephone Encounter (Signed)
Attempted f/u call back. No answer, left VM. 

## 2021-08-08 NOTE — Telephone Encounter (Signed)
Called and spoke with patient in regards to her appointments. Pt is aware of appts. Advised that I will send the information to her my chart and will place a copy of the information in the mail. Pt verbalized understanding and had no concerns at the end of the call.

## 2021-08-08 NOTE — Telephone Encounter (Signed)
Returned call to patient, she thought her GES was on a Saturday, advised that it is on a Monday. Pt states that she thinks she may have gotten that appt confused with another one that she has scheduled. I re-reviewed her upcoming appts for our office. Pt verbalized understanding and had no concerns at the end of the call.

## 2021-08-15 ENCOUNTER — Other Ambulatory Visit: Payer: Self-pay | Admitting: Family Medicine

## 2021-08-15 DIAGNOSIS — E041 Nontoxic single thyroid nodule: Secondary | ICD-10-CM

## 2021-08-17 ENCOUNTER — Other Ambulatory Visit (HOSPITAL_COMMUNITY)
Admission: RE | Admit: 2021-08-17 | Discharge: 2021-08-17 | Disposition: A | Discharge status: Home / Self Care | Payer: Medicare Other | Source: Ambulatory Visit | Attending: Physician Assistant | Admitting: Physician Assistant

## 2021-08-17 ENCOUNTER — Ambulatory Visit
Admission: RE | Admit: 2021-08-17 | Discharge: 2021-08-17 | Disposition: A | Discharge status: Home / Self Care | Payer: Medicare Other | Source: Ambulatory Visit | Attending: Family Medicine | Admitting: Family Medicine

## 2021-08-17 DIAGNOSIS — E041 Nontoxic single thyroid nodule: Secondary | ICD-10-CM | POA: Insufficient documentation

## 2021-08-18 LAB — CYTOLOGY - NON PAP

## 2021-08-22 DIAGNOSIS — E041 Nontoxic single thyroid nodule: Secondary | ICD-10-CM | POA: Diagnosis not present

## 2021-08-24 ENCOUNTER — Ambulatory Visit
Admission: RE | Admit: 2021-08-24 | Discharge: 2021-08-24 | Disposition: A | Discharge status: Home / Self Care | Payer: Medicare Other | Source: Ambulatory Visit | Attending: Family Medicine | Admitting: Family Medicine

## 2021-08-24 ENCOUNTER — Other Ambulatory Visit: Payer: Self-pay

## 2021-08-24 DIAGNOSIS — Z8673 Personal history of transient ischemic attack (TIA), and cerebral infarction without residual deficits: Secondary | ICD-10-CM | POA: Diagnosis not present

## 2021-08-24 DIAGNOSIS — R202 Paresthesia of skin: Secondary | ICD-10-CM

## 2021-08-24 DIAGNOSIS — R2990 Unspecified symptoms and signs involving the nervous system: Secondary | ICD-10-CM

## 2021-08-24 MED ORDER — GADOBENATE DIMEGLUMINE 529 MG/ML IV SOLN
12.0000 mL | Freq: Once | INTRAVENOUS | Status: AC | PRN
  Administered 2021-08-24: 12 mL via INTRAVENOUS

## 2021-08-28 NOTE — Progress Notes (Signed)
NEUROLOGY CONSULTATION NOTE  Destiny Moody MRN: [id number] DOB: Jan 28, 1951  Referring provider: Caren Macadam, MD Primary care provider: Caren Macadam, MD  Reason for consult:  TIA  Assessment/Plan:   Transient ischemic attack Type 2 diabetes mellitus  1  Check complete echocardiogram and 30 days cardiac event monitor 2.  Change from ASA to Plavix $RemoveB'75mg'fvnSuLXd$  daily 3   Secondary stroke prevention as managed by PCP:  - sent script for Plavix with refills.  Going forward, refills may be handled by PCP  - Statin.  LDL goal less than 70  - Normotensive blood pressure  - Hgb A1c goal less than 7 4  Follow up 7 months.   Subjective:  Destiny Moody is a 70 year old right-handed female with DM II, migraines and history of breast cancer s/p lumpectomy and radiation who presents for TIA.  History supplemented by referring provider's note.   In August, she developed left-sided numbness of face, arm and leg with some left arm and leg weakness lasting 5 minutes.  She was on vacation at the time.  She saw her PCP when she came home.  Carotid ultrasound on 08/08/2021 showed no hemodynamically significant ICA stenosis.  MRI of brain with and without contrast on 08/24/2021 personally reviewed showed chronic small vessel ischemic changes in the cerebral white matter but no acute/subacute stroke.  MRA of head personally reviewed was negative.  10-15 years ago, she had 2 other TIAs.  One time she lost vision for 5-10 minutes.  Another time, she had a severe headache.  She has been on ASA $Remo'81mg'EWTck$  for over 10 years.       PAST MEDICAL HISTORY: Past Medical History:  Diagnosis Date   Allergy    Arthritis    Breast cancer ([facility]) 08/24/14   right, upper inner   Cancer ([facility])    Chronic kidney disease    Depression    no meds   HSV (herpes simplex virus) anogenital infection 2018   Hypercholesterolemia    Hypertension    Hypothyroidism    Multinodular thyroid    NIDDM (non-insulin dependent diabetes  mellitus)    Osteoporosis 2020   T score -2.5 stable from prior DEXA   Radiation 01/31/15-03/01/15   right  breast   Rheumatoid arthritis(714.0)    lower back   Wears contact lenses     PAST SURGICAL HISTORY: Past Surgical History:  Procedure Laterality Date   BREAST LUMPECTOMY Right 2016   BREAST SURGERY     Lumpectomy   CESAREAN SECTION  1165,7903   x 2   CHOLECYSTECTOMY  1993   COLONOSCOPY  last 06/15/2016   greater than 12 years but not sure where colonoscopy was performed   INCISION AND DRAINAGE ABSCESS Right 11/24/2014   Procedure: INCISION AND DRAINAGE RIGHT AXILLA  ABSCESS;  Surgeon: Fanny Skates, MD;  Location: [facility];  Service: General;  Laterality: Right;   RADIOACTIVE SEED GUIDED PARTIAL MASTECTOMY WITH AXILLARY SENTINEL LYMPH NODE BIOPSY Right 10/12/2014   Procedure: RIGHT  PARTIAL MASTECTOMY WITH RADIOACTIVE SEED LOCALIZATION  RIGHT  AXILLARY SENTINEL  NODE BIOPSY;  Surgeon: Fanny Skates, MD;  Location: [facility];  Service: General;  Laterality: Right;   repair of lateral epicondylitis of right humerus  01/15/2021   TUBAL LIGATION  1996   re annastomosis   WISDOM TOOTH EXTRACTION      MEDICATIONS: Current Outpatient Medications on File Prior to Visit  Medication Sig Dispense Refill   amitriptyline (ELAVIL) 25 MG tablet TAKE  ONE TABLET BY MOUTH AT BEDTIME  "OFFICE VISIT NEEDED" (Patient taking differently: Take 25 mg by mouth at bedtime as needed for sleep.) 90 tablet 3   Blood Glucose Monitoring Suppl (ONETOUCH VERIO FLEX SYSTEM) w/Device KIT      Calcium Carbonate-Vitamin D 600-200 MG-UNIT TABS Take 1 tablet by mouth daily.      DULoxetine (CYMBALTA) 30 MG capsule 1 capsule     EQUATE STOOL SOFTENER 100 MG capsule Take 100 mg by mouth daily.     glimepiride (AMARYL) 2 MG tablet Take 2 mg by mouth daily before breakfast.     Glucosamine 500 MG CAPS Take 1 capsule by mouth daily.     hydrocortisone (ANUSOL-HC) 2.5 % rectal cream Place 1  application rectally 2 (two) times daily. Make sure cream is inside. (Patient taking differently: Place 1 application rectally daily as needed for hemorrhoids. Make sure cream is inside.) 30 g 1   ibandronate (BONIVA) 150 MG tablet Take 1 tablet (150 mg total) by mouth every 30 (thirty) days. Take in the morning with a full glass of water, on an empty stomach, and do not take anything else by mouth or lie down for the next 30 min. 3 tablet 2   insulin glargine (LANTUS SOLOSTAR) 100 UNIT/ML Solostar Pen Inject into the skin. (Patient not taking: Reported on 08/04/2021)     liraglutide (VICTOZA) 18 MG/3ML SOPN Inject 1.2 mg into the skin daily.     lisinopril (ZESTRIL) 2.5 MG tablet Take 2.5 mg by mouth daily.     metFORMIN (GLUCOPHAGE-XR) 500 MG 24 hr tablet TAKE TWO TABLETS BY MOUTH TWICE DAILY (Patient taking differently: Take 1,000 mg by mouth in the morning and at bedtime.) 360 tablet 0   Multiple Vitamin (MULTIVITAMIN) capsule Take 1 capsule by mouth daily.     Omega-3 Fatty Acids (OMEGA-3 FISH OIL PO) Take 1 capsule by mouth daily.      oxybutynin (DITROPAN) 5 MG tablet Take 1 tablet (5 mg total) by mouth 2 (two) times daily. 60 tablet 0   pantoprazole (PROTONIX) 20 MG tablet Take 1 tablet (20 mg total) by mouth 2 (two) times daily before a meal. (Patient not taking: Reported on 08/04/2021) 40 tablet 1   pioglitazone (ACTOS) 45 MG tablet Take 45 mg by mouth daily.     ReliOn Ultra Thin Lancets MISC See admin instructions.     simvastatin (ZOCOR) 20 MG tablet Take 1 tablet (20 mg total) by mouth at bedtime. 90 tablet 3   traMADol (ULTRAM) 50 MG tablet Take 1 tablet (50 mg total) by mouth every 6 (six) hours as needed. (Patient not taking: Reported on 08/04/2021) 10 tablet 0   triamcinolone cream (KENALOG) 0.1 % Apply 1 application topically daily as needed (dry skin).  (Patient not taking: Reported on 08/04/2021)     TURMERIC PO Take 1 capsule by mouth daily.     No current facility-administered  medications on file prior to visit.    ALLERGIES: Allergies  Allergen Reactions   Shellfish Allergy Hives    unsure    FAMILY HISTORY: Family History  Problem Relation Age of Onset   Diabetes Mother        niddm   Heart disease Mother    Heart failure Father    Heart disease Father    Ovarian cancer Cousin 15   Hyperlipidemia Sister    Colon cancer Maternal Uncle 5   Esophageal cancer Neg Hx    Rectal cancer Neg Hx  Stomach cancer Neg Hx    Colon polyps Neg Hx     Objective:  Blood pressure 114/65, pulse 71, height $RemoveBe'5\' 2"'kGEZVJdxH$  (1.575 m), weight 135 lb 3.2 oz (61.3 kg), SpO2 100 %. General: No acute distress.  Patient appears well-groomed.   Head:  Normocephalic/atraumatic Eyes:  fundi examined but not visualized Neck: supple, no paraspinal tenderness, full range of motion Back: No paraspinal tenderness Heart: regular rate and rhythm Lungs: Clear to auscultation bilaterally. Vascular: No carotid bruits. Neurological Exam: Mental status: alert and oriented to person, place, and time, recent and remote memory intact, fund of knowledge intact, attention and concentration intact, speech fluent and not dysarthric, language intact. Cranial nerves: CN I: not tested CN II: pupils equal, round and reactive to light, visual fields intact CN III, IV, VI:  full range of motion, no nystagmus, no ptosis CN V: facial sensation intact. CN VII: upper and lower face symmetric CN VIII: hearing intact CN IX, X: gag intact, uvula midline CN XI: sternocleidomastoid and trapezius muscles intact CN XII: tongue midline Bulk & Tone: normal, no fasciculations. Motor:  muscle strength 5/5 throughout Sensation:  Pinprick, temperature and vibratory sensation intact. Deep Tendon Reflexes:  2+ throughout,  toes downgoing.   Finger to nose testing:  Without dysmetria.   Heel to shin:  Without dysmetria.   Gait:  Normal station and stride.  Romberg negative.    Thank you for allowing me to take  part in the care of this patient.  Metta Clines, DO  CC: Caren Macadam, MD

## 2021-08-29 ENCOUNTER — Other Ambulatory Visit: Payer: Self-pay

## 2021-08-29 ENCOUNTER — Ambulatory Visit (INDEPENDENT_AMBULATORY_CARE_PROVIDER_SITE_OTHER): Payer: Medicare Other | Admitting: Neurology

## 2021-08-29 ENCOUNTER — Encounter: Payer: Self-pay | Admitting: Neurology

## 2021-08-29 VITALS — BP 114/65 | HR 71 | Ht 62.0 in | Wt 135.2 lb

## 2021-08-29 DIAGNOSIS — G459 Transient cerebral ischemic attack, unspecified: Secondary | ICD-10-CM

## 2021-08-29 DIAGNOSIS — E785 Hyperlipidemia, unspecified: Secondary | ICD-10-CM

## 2021-08-29 DIAGNOSIS — E118 Type 2 diabetes mellitus with unspecified complications: Secondary | ICD-10-CM | POA: Diagnosis not present

## 2021-08-29 MED ORDER — CLOPIDOGREL BISULFATE 75 MG PO TABS: 75.0000 mg | ORAL_TABLET | Freq: Every day | ORAL | 3 refills | Status: DC

## 2021-08-29 NOTE — Patient Instructions (Signed)
Stop aspirin.  Start clopidogrel 75mg  daily Continue simvastatin.   Continue trying to get diabetes under control Will check complete echocardiogram Will check 30 day cardiac event monitor Follow up 7 months.

## 2021-09-01 DIAGNOSIS — E7800 Pure hypercholesterolemia, unspecified: Secondary | ICD-10-CM | POA: Diagnosis not present

## 2021-09-01 DIAGNOSIS — Z7984 Long term (current) use of oral hypoglycemic drugs: Secondary | ICD-10-CM | POA: Diagnosis not present

## 2021-09-01 DIAGNOSIS — E041 Nontoxic single thyroid nodule: Secondary | ICD-10-CM | POA: Diagnosis not present

## 2021-09-01 DIAGNOSIS — H9203 Otalgia, bilateral: Secondary | ICD-10-CM | POA: Diagnosis not present

## 2021-09-01 DIAGNOSIS — Z1389 Encounter for screening for other disorder: Secondary | ICD-10-CM | POA: Diagnosis not present

## 2021-09-01 DIAGNOSIS — E118 Type 2 diabetes mellitus with unspecified complications: Secondary | ICD-10-CM | POA: Diagnosis not present

## 2021-09-01 DIAGNOSIS — G8929 Other chronic pain: Secondary | ICD-10-CM | POA: Diagnosis not present

## 2021-09-01 DIAGNOSIS — Z0000 Encounter for general adult medical examination without abnormal findings: Secondary | ICD-10-CM | POA: Diagnosis not present

## 2021-09-04 ENCOUNTER — Encounter (HOSPITAL_COMMUNITY)
Admission: RE | Admit: 2021-09-04 | Discharge: 2021-09-04 | Disposition: A | Discharge status: Home / Self Care | Payer: Medicare Other | Source: Ambulatory Visit | Attending: Gastroenterology | Admitting: Gastroenterology

## 2021-09-04 ENCOUNTER — Encounter (HOSPITAL_COMMUNITY): Payer: Self-pay

## 2021-09-04 DIAGNOSIS — R1013 Epigastric pain: Secondary | ICD-10-CM | POA: Diagnosis not present

## 2021-09-04 DIAGNOSIS — R109 Unspecified abdominal pain: Secondary | ICD-10-CM | POA: Diagnosis not present

## 2021-09-04 DIAGNOSIS — K3189 Other diseases of stomach and duodenum: Secondary | ICD-10-CM | POA: Diagnosis not present

## 2021-09-04 DIAGNOSIS — R112 Nausea with vomiting, unspecified: Secondary | ICD-10-CM | POA: Insufficient documentation

## 2021-09-04 MED ORDER — TECHNETIUM TC 99M SULFUR COLLOID
2.0000 | Freq: Once | INTRAVENOUS | Status: AC | PRN
  Administered 2021-09-04: 08:00:00 2 via INTRAVENOUS

## 2021-09-05 ENCOUNTER — Telehealth: Payer: Self-pay | Admitting: Gastroenterology

## 2021-09-05 MED ORDER — PANTOPRAZOLE SODIUM 20 MG PO TBEC: 20.0000 mg | DELAYED_RELEASE_TABLET | Freq: Two times a day (BID) | ORAL | 1 refills | Status: DC

## 2021-09-05 NOTE — Telephone Encounter (Signed)
Called and spoke with patient. She states that she has been having a stomach ache since the GES yesterday. Pt reports that she did pass some dark stools, advised that the radioactive material is released in the stool. Pt states that she does not have anymore Protonix  mg and asked that we refill this for her. Advised pt that she can also try Pepto-bismol or Tums over the counter. She is aware that Pepto can cause her stools to turn black as well. Pt has an upcoming follow up appt on 09/19/21. Pt had no concerns at the end of the call.

## 2021-09-05 NOTE — Telephone Encounter (Signed)
Patient called stating that she is having stomach aches since she had her Gastric emptying procedure yesterday done at [facility]. Seeking advice, please advise.

## 2021-09-07 ENCOUNTER — Other Ambulatory Visit: Payer: Self-pay

## 2021-09-07 ENCOUNTER — Ambulatory Visit (INDEPENDENT_AMBULATORY_CARE_PROVIDER_SITE_OTHER): Payer: Medicare Other

## 2021-09-07 ENCOUNTER — Ambulatory Visit (HOSPITAL_COMMUNITY)
Admission: RE | Admit: 2021-09-07 | Discharge: 2021-09-07 | Disposition: A | Discharge status: Home / Self Care | Payer: Medicare Other | Source: Ambulatory Visit | Attending: Neurology | Admitting: Neurology

## 2021-09-07 DIAGNOSIS — E119 Type 2 diabetes mellitus without complications: Secondary | ICD-10-CM | POA: Diagnosis not present

## 2021-09-07 DIAGNOSIS — I4891 Unspecified atrial fibrillation: Secondary | ICD-10-CM

## 2021-09-07 DIAGNOSIS — G459 Transient cerebral ischemic attack, unspecified: Secondary | ICD-10-CM | POA: Diagnosis not present

## 2021-09-07 DIAGNOSIS — I10 Essential (primary) hypertension: Secondary | ICD-10-CM | POA: Diagnosis not present

## 2021-09-07 LAB — ECHOCARDIOGRAM COMPLETE
AR max vel: 2.16 cm2
AV Peak grad: 5.4 mmHg
Ao pk vel: 1.16 m/s
Area-P 1/2: 4.29 cm2
Calc EF: 60.8 %
S' Lateral: 3.1 cm
Single Plane A2C EF: 62.3 %
Single Plane A4C EF: 62.7 %

## 2021-09-13 ENCOUNTER — Other Ambulatory Visit: Payer: Self-pay

## 2021-09-13 ENCOUNTER — Ambulatory Visit (INDEPENDENT_AMBULATORY_CARE_PROVIDER_SITE_OTHER): Payer: Medicare Other | Admitting: Interventional Cardiology

## 2021-09-13 ENCOUNTER — Encounter: Payer: Self-pay | Admitting: Interventional Cardiology

## 2021-09-13 VITALS — BP 112/66 | HR 79 | Ht 62.0 in | Wt 138.0 lb

## 2021-09-13 DIAGNOSIS — I700 Atherosclerosis of aorta: Secondary | ICD-10-CM

## 2021-09-13 DIAGNOSIS — G459 Transient cerebral ischemic attack, unspecified: Secondary | ICD-10-CM

## 2021-09-13 DIAGNOSIS — E1159 Type 2 diabetes mellitus with other circulatory complications: Secondary | ICD-10-CM | POA: Diagnosis not present

## 2021-09-13 DIAGNOSIS — E782 Mixed hyperlipidemia: Secondary | ICD-10-CM | POA: Diagnosis not present

## 2021-09-13 MED ORDER — ROSUVASTATIN CALCIUM 20 MG PO TABS: 20.0000 mg | ORAL_TABLET | Freq: Every day | ORAL | 3 refills | Status: DC

## 2021-09-13 NOTE — Progress Notes (Signed)
Cardiology Office Note   Date:  09/13/2021   ID:  Destiny Moody, DOB [birth year], MRN [id number]  PCP:  Aliene Beams, MD    No chief complaint on file.  Possible TIA  Wt Readings from Last 3 Encounters:  09/13/21 138 lb (62.6 kg)  08/29/21 135 lb 3.2 oz (61.3 kg)  08/04/21 134 lb (60.8 kg)       History of Present Illness: Destiny Moody is a 70 y.o. female who is being seen today for the evaluation of TIA at the request of Aliene Beams, MD.  H/o Breast cancer.  Had radiation and oral chemo.  Father died at 27 with MI. Mother was diabetic and passed away form heart MI.  She was in [place] and October 2022.  At that time, she had some left-sided hand and arm numbness.  She had some numbness on the left side of her face.  There is no change in her speech.  Symptoms lasted for about 5 minutes and then resolved.  MRI was done.  Mild carotid atherosclerosis was seen on carotid duplex.  She has been on oral therapy for her diabetes for some time.  She has been on Zocor for hyperlipidemia.  Echo in 2022 showed: "Left ventricular ejection fraction, by estimation, is 60 to 65%. The  left ventricle has normal function. The left ventricle has no regional  wall motion abnormalities. Left ventricular diastolic parameters are  consistent with Grade I diastolic  dysfunction (impaired relaxation). The average left ventricular global  longitudinal strain is -19.1 %. The global longitudinal strain is normal.   2. Right ventricular systolic function is normal. The right ventricular  size is normal.   3. The mitral valve is normal in structure. No evidence of mitral valve  regurgitation. No evidence of mitral stenosis.   4. The aortic valve is normal in structure. Aortic valve regurgitation is  not visualized. No aortic stenosis is present.   5. The inferior vena cava is normal in size with greater than 50%  respiratory variability, suggesting right atrial pressure of 3 mmHg. "  Denies  : Chest pain. Dizziness. Leg edema. Nitroglycerin use. Orthopnea. Palpitations. Paroxysmal nocturnal dyspnea. Shortness of breath. Syncope.      Past Medical History:  Diagnosis Date   Allergy    Arthritis    Breast cancer ([facility]) 08/24/14   right, upper inner   Cancer ([facility])    Chronic kidney disease    Depression    no meds   HSV (herpes simplex virus) anogenital infection 2018   Hypercholesterolemia    Hypertension    Hypothyroidism    Multinodular thyroid    NIDDM (non-insulin dependent diabetes mellitus)    Osteoporosis 2020   T score -2.5 stable from prior DEXA   Radiation 01/31/15-03/01/15   right  breast   Rheumatoid arthritis(714.0)    lower back   Wears contact lenses     Past Surgical History:  Procedure Laterality Date   BREAST LUMPECTOMY Right 2016   BREAST SURGERY     Lumpectomy   CESAREAN SECTION  5179,1861   x 2   CHOLECYSTECTOMY  1993   COLONOSCOPY  last 06/15/2016   greater than 12 years but not sure where colonoscopy was performed   INCISION AND DRAINAGE ABSCESS Right 11/24/2014   Procedure: INCISION AND DRAINAGE RIGHT AXILLA  ABSCESS;  Surgeon: Claud Kelp, MD;  Location[facility];  Service: General;  Laterality: Right;   RADIOACTIVE SEED GUIDED PARTIAL MASTECTOMY WITH AXILLARY SENTINEL  LYMPH NODE BIOPSY Right 10/12/2014   Procedure: RIGHT  PARTIAL MASTECTOMY WITH RADIOACTIVE SEED LOCALIZATION  RIGHT  AXILLARY SENTINEL  NODE BIOPSY;  Surgeon: Fanny Skates, MD;  Location: [facility];  Service: General;  Laterality: Right;   repair of lateral epicondylitis of right humerus  01/15/2021   TUBAL LIGATION  1996   re annastomosis   WISDOM TOOTH EXTRACTION       Current Outpatient Medications  Medication Sig Dispense Refill   Blood Glucose Monitoring Suppl (ONETOUCH VERIO FLEX SYSTEM) w/Device KIT      Calcium Carbonate-Vitamin D 600-200 MG-UNIT TABS Take 1 tablet by mouth daily.      clopidogrel (PLAVIX) 75 MG tablet Take 1 tablet (75 mg  total) by mouth daily. 30 tablet 3   DULoxetine (CYMBALTA) 30 MG capsule 1 capsule     EQUATE STOOL SOFTENER 100 MG capsule Take 100 mg by mouth daily.     glimepiride (AMARYL) 2 MG tablet Take 2 mg by mouth daily before breakfast.     Glucosamine 500 MG CAPS Take 1 capsule by mouth daily.     hydrocortisone (ANUSOL-HC) 2.5 % rectal cream Place 1 application rectally 2 (two) times daily. Make sure cream is inside. (Patient taking differently: Place 1 application rectally daily as needed for hemorrhoids. Make sure cream is inside.) 30 g 1   ibandronate (BONIVA) 150 MG tablet Take 1 tablet (150 mg total) by mouth every 30 (thirty) days. Take in the morning with a full glass of water, on an empty stomach, and do not take anything else by mouth or lie down for the next 30 min. 3 tablet 2   liraglutide (VICTOZA) 18 MG/3ML SOPN Inject 1.2 mg into the skin daily.     lisinopril (ZESTRIL) 2.5 MG tablet Take 2.5 mg by mouth daily.     metFORMIN (GLUCOPHAGE-XR) 500 MG 24 hr tablet TAKE TWO TABLETS BY MOUTH TWICE DAILY (Patient taking differently: Take 1,000 mg by mouth in the morning and at bedtime.) 360 tablet 0   Multiple Vitamin (MULTIVITAMIN) capsule Take 1 capsule by mouth daily.     Omega-3 Fatty Acids (OMEGA-3 FISH OIL PO) Take 1 capsule by mouth daily.      oxybutynin (DITROPAN) 5 MG tablet Take 1 tablet (5 mg total) by mouth 2 (two) times daily. 60 tablet 0   pantoprazole (PROTONIX) 20 MG tablet Take 1 tablet (20 mg total) by mouth 2 (two) times daily before a meal. 60 tablet 1   pioglitazone (ACTOS) 45 MG tablet Take 45 mg by mouth daily.     ReliOn Ultra Thin Lancets MISC See admin instructions.     simvastatin (ZOCOR) 20 MG tablet Take 1 tablet (20 mg total) by mouth at bedtime. 90 tablet 3   triamcinolone cream (KENALOG) 0.1 % Apply 1 application topically daily as needed (dry skin).     TURMERIC PO Take 1 capsule by mouth daily.     amitriptyline (ELAVIL) 25 MG tablet TAKE ONE TABLET BY MOUTH  AT BEDTIME  "OFFICE VISIT NEEDED" (Patient not taking: Reported on 09/13/2021) 90 tablet 3   insulin glargine (LANTUS SOLOSTAR) 100 UNIT/ML Solostar Pen Inject into the skin. (Patient not taking: Reported on 09/13/2021)     traMADol (ULTRAM) 50 MG tablet Take 1 tablet (50 mg total) by mouth every 6 (six) hours as needed. (Patient not taking: Reported on 09/13/2021) 10 tablet 0   No current facility-administered medications for this visit.    Allergies:   Shellfish  allergy    Social History:  The patient  reports that she has never smoked. She has never used smokeless tobacco. She reports that she does not currently use alcohol. She reports that she does not use drugs.   Family History:  The patient's family history includes Colon cancer (age of onset: 26) in her maternal uncle; Diabetes in her mother; Heart disease in her father and mother; Heart failure in her father; Hyperlipidemia in her sister; Ovarian cancer (age of onset: 60) in her cousin.    ROS:  Please see the history of present illness.   Otherwise, review of systems are positive for .   All other systems are reviewed and negative.    PHYSICAL EXAM: VS:  BP 112/66 (BP Location: Left Arm, Patient Position: Sitting, Cuff Size: Normal)    Pulse 79    Ht $R'5\' 2"'NL$  (1.575 m)    Wt 138 lb (62.6 kg)    SpO2 99%    BMI 25.24 kg/m  , BMI Body mass index is 25.24 kg/m. GEN: Well nourished, well developed, in no acute distress HEENT: normal Neck: no JVD, carotid bruits, or masses Cardiac: RRR; no murmurs, rubs, or gallops,no edema  Respiratory:  clear to auscultation bilaterally, normal work of breathing GI: soft, nontender, nondistended, + BS MS: no deformity or atrophy Skin: warm and dry, no rash Neuro:  Strength and sensation are intact Psych: euthymic mood, full affect   EKG:   The ekg ordered today demonstrates normal ECG   Recent Labs: 07/19/2021: ALT 19; BUN 17; Creatinine, Ser 0.70; Hemoglobin 13.4; Platelets 285; Potassium  3.3; Sodium 138   Lipid Panel    Component Value Date/Time   CHOL 150 07/14/2016 1437   TRIG 161 (H) 07/14/2016 1437   HDL 42 (L) 07/14/2016 1437   CHOLHDL 3.6 07/14/2016 1437   VLDL 32 (H) 07/14/2016 1437   LDLCALC 76 07/14/2016 1437     Other studies Reviewed: Additional studies/ records that were reviewed today with results demonstrating: labs reviewed; abdominal CT; brain MRI reviewed.   ASSESSMENT AND PLAN:  TIA: No recurrence of sx.  MRI and carotid Doppler ok.   DM: A1C 7.0.  Whole food, plant based diet. Increase fiber intake.  Avoid processed foods.  Decrease meat intake. Husband eats healthy.  Hyperlipidemia: LDL 67.  Stop Zocor and start rosuvastatin 20 mg daily.  Family h/o CAD: Parents with MI.  Sister with "big heart." Aortic atherosclerosis: Increase intensity of statin.    Current medicines are reviewed at length with the patient today.  The patient concerns regarding her medicines were addressed.  The following changes have been made:  No change  Labs/ tests ordered today include:  No orders of the defined types were placed in this encounter.   Recommend 150 minutes/week of aerobic exercise Low fat, low carb, high fiber diet recommended  Disposition:   FU in 1 year   Signed, Larae Grooms, MD  09/13/2021 4:17 PM    [facility] [place], [place], [place]  [address] Phone: [phone]; Fax: [phone]

## 2021-09-13 NOTE — Patient Instructions (Signed)
Medication Instructions:  1) DISCONTINUE Simvastatin 2) START Rosuvastatin 20mg  once daily  *If you need a refill on your cardiac medications before your next appointment, please call your pharmacy*   Lab Work: Lipid and Liver in 3 months.  You will need to be fasting for these labs (nothing to eat or drink after midnight except water and black coffee).  If you have labs (blood work) drawn today and your tests are completely normal, you will receive your results only by: [place] (if you have MyChart) OR A paper copy in the mail If you have any lab test that is abnormal or we need to change your treatment, we will call you to review the results.   Testing/Procedures: None   Follow-Up: At [facility], you and your health needs are our priority.  As part of our continuing mission to provide you with exceptional heart care, we have created designated Provider Care Teams.  These Care Teams include your primary Cardiologist (physician) and Advanced Practice Providers (APPs -  Physician Assistants and Nurse Practitioners) who all work together to provide you with the care you need, when you need it.  We recommend signing up for the patient portal called "MyChart".  Sign up information is provided on this After Visit Summary.  MyChart is used to connect with patients for Virtual Visits (Telemedicine).  Patients are able to view lab/test results, encounter notes, upcoming appointments, etc.  Non-urgent messages can be sent to your provider as well.   To learn more about what you can do with MyChart, go to [email or web address].    Your next appointment:   1 year(s)  The format for your next appointment:   In Person  Provider:   Larae Grooms, MD     Other Instructions

## 2021-09-14 DIAGNOSIS — E041 Nontoxic single thyroid nodule: Secondary | ICD-10-CM | POA: Diagnosis not present

## 2021-09-17 HISTORY — PX: THYROIDECTOMY, PARTIAL: SHX18

## 2021-09-19 ENCOUNTER — Ambulatory Visit (INDEPENDENT_AMBULATORY_CARE_PROVIDER_SITE_OTHER): Payer: Medicare Other | Admitting: Gastroenterology

## 2021-09-19 ENCOUNTER — Encounter: Payer: Self-pay | Admitting: Gastroenterology

## 2021-09-19 VITALS — BP 102/54 | HR 80 | Ht 62.0 in | Wt 139.4 lb

## 2021-09-19 DIAGNOSIS — R6881 Early satiety: Secondary | ICD-10-CM | POA: Diagnosis not present

## 2021-09-19 DIAGNOSIS — R1013 Epigastric pain: Secondary | ICD-10-CM | POA: Diagnosis not present

## 2021-09-19 MED ORDER — PANTOPRAZOLE SODIUM 20 MG PO TBEC: 20.0000 mg | DELAYED_RELEASE_TABLET | Freq: Two times a day (BID) | ORAL | 0 refills | Status: DC

## 2021-09-19 NOTE — Progress Notes (Signed)
[facility] Progress Note  Chief Complaint: Epigastric pain  Subjective  History: From 07/25/2021 clinic note: "It was somewhat difficult to get a clear description of the symptoms, but Destiny Moody says that a few months ago she was having some intermittent diarrhea that eventually resolved.  Several weeks ago she began having upper abdominal pain with belching that she perceived as reflux.  She was under a lot of stress related to caring for her daughter around the time of having her first child.  With that she was also having bloating and visible distention and at times her stomach was "hard" Currently taking pantoprazole but not Carafate. Denies dysphagia or odynophagia No new meds including diabetic meds prior to onset of digestive symptoms. She does not recall ever having been tested or treated for H. pylori, though she thinks she may have had an upper endoscopy in the distant past."  EGD with retained food in the stomach, limiting visualization somewhat.  Biopsies taken to rule out H. pylori and were negative.  Gastric emptying study scheduled with report as below.  She called our office with worsened upper abdominal pain the day after her GES, and was prescribed pantoprazole 20 mg twice daily (by covering physician while I was out of the office).  She feels that is helped somewhat and is lately improved.  Still has early satiety, denies vomiting or dysphagia  Although I had inquired at the last office visit, I again checked on her medicines to make sure she had not been prescribed any new diabetic medicines or change of dose in the last 4 to 6 months and she indicated that she had not.  ROS: Cardiovascular:  no chest pain Respiratory: no dyspnea Mood stable Remainder systems negative except as above The patient's Past Medical, Family and Social History were reviewed and are on file in the EMR.  Objective:  Med list reviewed  Current Outpatient Medications:    amitriptyline  (ELAVIL) 25 MG tablet, TAKE ONE TABLET BY MOUTH AT BEDTIME  "OFFICE VISIT NEEDED", Disp: 90 tablet, Rfl: 3   Blood Glucose Monitoring Suppl (ONETOUCH VERIO FLEX SYSTEM) w/Device KIT, , Disp: , Rfl:    Calcium Carbonate-Vitamin D 600-200 MG-UNIT TABS, Take 1 tablet by mouth daily. , Disp: , Rfl:    clopidogrel (PLAVIX) 75 MG tablet, Take 1 tablet (75 mg total) by mouth daily., Disp: 30 tablet, Rfl: 3   DULoxetine (CYMBALTA) 30 MG capsule, 1 capsule, Disp: , Rfl:    Glucosamine 500 MG CAPS, Take 1 capsule by mouth daily., Disp: , Rfl:    hydrocortisone (ANUSOL-HC) 2.5 % rectal cream, Place 1 application rectally 2 (two) times daily. Make sure cream is inside. (Patient taking differently: Place 1 application rectally daily as needed for hemorrhoids. Make sure cream is inside.), Disp: 30 g, Rfl: 1   ibandronate (BONIVA) 150 MG tablet, Take 1 tablet (150 mg total) by mouth every 30 (thirty) days. Take in the morning with a full glass of water, on an empty stomach, and do not take anything else by mouth or lie down for the next 30 min., Disp: 3 tablet, Rfl: 2   liraglutide (VICTOZA) 18 MG/3ML SOPN, Inject 1.2 mg into the skin daily., Disp: , Rfl:    lisinopril (ZESTRIL) 2.5 MG tablet, Take 2.5 mg by mouth daily., Disp: , Rfl:    metFORMIN (GLUCOPHAGE-XR) 500 MG 24 hr tablet, TAKE TWO TABLETS BY MOUTH TWICE DAILY (Patient taking differently: Take 1,000 mg by mouth in the morning  and at bedtime.), Disp: 360 tablet, Rfl: 0   Multiple Vitamin (MULTIVITAMIN) capsule, Take 1 capsule by mouth daily., Disp: , Rfl:    Omega-3 Fatty Acids (OMEGA-3 FISH OIL PO), Take 1 capsule by mouth daily. , Disp: , Rfl:    oxybutynin (DITROPAN) 5 MG tablet, Take 1 tablet (5 mg total) by mouth 2 (two) times daily., Disp: 60 tablet, Rfl: 0   pioglitazone (ACTOS) 45 MG tablet, Take 45 mg by mouth daily., Disp: , Rfl:    ReliOn Ultra Thin Lancets MISC, See admin instructions., Disp: , Rfl:    rosuvastatin (CRESTOR) 20 MG tablet, Take  1 tablet (20 mg total) by mouth daily., Disp: 90 tablet, Rfl: 3   traMADol (ULTRAM) 50 MG tablet, Take 1 tablet (50 mg total) by mouth every 6 (six) hours as needed., Disp: 10 tablet, Rfl: 0   triamcinolone cream (KENALOG) 0.1 %, Apply 1 application topically daily as needed (dry skin)., Disp: , Rfl:    TURMERIC PO, Take 1 capsule by mouth daily., Disp: , Rfl:    pantoprazole (PROTONIX) 20 MG tablet, Take 1 tablet (20 mg total) by mouth 2 (two) times daily before a meal., Disp: 30 tablet, Rfl: 0   Vital signs in last 24 hrs: Vitals:   09/19/21 1105  BP: (!) 102/54  Pulse: 80   Wt Readings from Last 3 Encounters:  09/19/21 139 lb 6 oz (63.2 kg)  09/13/21 138 lb (62.6 kg)  08/29/21 135 lb 3.2 oz (61.3 kg)    Physical Exam  Well-appearing HEENT: sclera anicteric, oral mucosa moist without lesions Neck: supple, no thyromegaly, JVD or lymphadenopathy Cardiac: RRR without murmurs, S1S2 heard, no peripheral edema Pulm: clear to auscultation bilaterally, normal RR and effort noted Abdomen: soft, no tenderness, with active bowel sounds. No guarding or palpable hepatosplenomegaly.  Labs:  Gastric biopsies negative H. pylori ___________________________________________ Radiologic studies:  CLINICAL DATA:  Retained food on EGD, nausea vomiting stomach pains   EXAM: NUCLEAR MEDICINE GASTRIC EMPTYING SCAN   TECHNIQUE: After oral ingestion of radiolabeled meal, sequential abdominal images were obtained for 4 hours. Percentage of activity emptying the stomach was calculated at 1 hour, 2 hour, 3 hour, and 4 hours.   RADIOPHARMACEUTICALS:  2.0 mCi Tc-67m sulfur colloid in standardized meal   COMPARISON:  CT July 19, 2021   FINDINGS: Expected location of the stomach in the left upper quadrant. Ingested meal empties the stomach gradually over the course of the study.   24% emptied at 1 hr ( normal >= 10%)   48% emptied at 2 hr ( normal >= 40%)   73% emptied at 3 hr ( normal >=  70%)   97% emptied at 4 hr ( normal >= 90%)   IMPRESSION: Normal gastric emptying study.     Electronically Signed   By: Dahlia Bailiff M.D.   On: 09/04/2021 15:25  ____________________________________________ Other:   _____________________________________________ Assessment & Plan  Assessment: Encounter Diagnoses  Name Primary?   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  Perhaps she has mild intermittent delay in stomach emptying with heightened sensitivity. Recommended smaller more frequent meal pattern, decrease PPI to once daily for 2 weeks and then stop (new prescription sent), monitor for changes and see me as needed.   30 minutes were spent on this encounter (including chart review, history/exam, counseling/coordination of care, and documentation) > 50% of that time was spent on counseling and coordination of care.  [name]

## 2021-09-19 NOTE — Patient Instructions (Signed)
If you are age 70 or older, your body mass index should be between 23-30. Your Body mass index is 25.49 kg/m. If this is out of the aforementioned range listed, please consider follow up with your Primary Care Provider.  If you are age 29 or younger, your body mass index should be between 19-25. Your Body mass index is 25.49 kg/m. If this is out of the aformentioned range listed, please consider follow up with your Primary Care Provider.   ________________________________________________________  The [facility] providers would like to encourage you to use [facility] to communicate with providers for non-urgent requests or questions.  Due to long hold times on the telephone, sending your provider a message by [facility] may be a faster and more efficient way to get a response.  Please allow 48 business hours for a response.  Please remember that this is for non-urgent requests.  _______________________________________________________  It was a pleasure to see you today!  Thank you for trusting me with your gastrointestinal care!

## 2021-09-29 ENCOUNTER — Other Ambulatory Visit: Payer: Self-pay | Admitting: Family Medicine

## 2021-09-29 DIAGNOSIS — Z8673 Personal history of transient ischemic attack (TIA), and cerebral infarction without residual deficits: Secondary | ICD-10-CM

## 2021-10-03 DIAGNOSIS — M26621 Arthralgia of right temporomandibular joint: Secondary | ICD-10-CM | POA: Diagnosis not present

## 2021-10-03 DIAGNOSIS — L299 Pruritus, unspecified: Secondary | ICD-10-CM | POA: Diagnosis not present

## 2021-10-10 ENCOUNTER — Ambulatory Visit
Admission: RE | Admit: 2021-10-10 | Discharge: 2021-10-10 | Disposition: A | Discharge status: Home / Self Care | Payer: Medicare Other | Source: Ambulatory Visit | Attending: Family Medicine | Admitting: Family Medicine

## 2021-10-10 DIAGNOSIS — Z8673 Personal history of transient ischemic attack (TIA), and cerebral infarction without residual deficits: Secondary | ICD-10-CM | POA: Diagnosis not present

## 2021-10-10 DIAGNOSIS — I6523 Occlusion and stenosis of bilateral carotid arteries: Secondary | ICD-10-CM | POA: Diagnosis not present

## 2021-10-13 ENCOUNTER — Other Ambulatory Visit: Payer: Self-pay | Admitting: Neurology

## 2021-10-13 DIAGNOSIS — G459 Transient cerebral ischemic attack, unspecified: Secondary | ICD-10-CM

## 2021-10-13 DIAGNOSIS — I4891 Unspecified atrial fibrillation: Secondary | ICD-10-CM

## 2021-10-20 NOTE — Progress Notes (Signed)
Pt advised of her results.

## 2021-10-25 DIAGNOSIS — K219 Gastro-esophageal reflux disease without esophagitis: Secondary | ICD-10-CM | POA: Diagnosis not present

## 2021-10-25 DIAGNOSIS — Z7902 Long term (current) use of antithrombotics/antiplatelets: Secondary | ICD-10-CM | POA: Diagnosis not present

## 2021-10-25 DIAGNOSIS — E041 Nontoxic single thyroid nodule: Secondary | ICD-10-CM | POA: Diagnosis not present

## 2021-10-25 DIAGNOSIS — E0789 Other specified disorders of thyroid: Secondary | ICD-10-CM | POA: Diagnosis not present

## 2021-10-25 DIAGNOSIS — F32A Depression, unspecified: Secondary | ICD-10-CM | POA: Diagnosis not present

## 2021-10-25 DIAGNOSIS — Z8673 Personal history of transient ischemic attack (TIA), and cerebral infarction without residual deficits: Secondary | ICD-10-CM | POA: Diagnosis not present

## 2021-10-25 DIAGNOSIS — Z01810 Encounter for preprocedural cardiovascular examination: Secondary | ICD-10-CM | POA: Diagnosis not present

## 2021-10-25 DIAGNOSIS — E119 Type 2 diabetes mellitus without complications: Secondary | ICD-10-CM | POA: Diagnosis not present

## 2021-10-25 DIAGNOSIS — Z853 Personal history of malignant neoplasm of breast: Secondary | ICD-10-CM | POA: Diagnosis not present

## 2021-10-25 DIAGNOSIS — Z794 Long term (current) use of insulin: Secondary | ICD-10-CM | POA: Diagnosis not present

## 2021-10-25 DIAGNOSIS — E785 Hyperlipidemia, unspecified: Secondary | ICD-10-CM | POA: Diagnosis not present

## 2021-10-26 DIAGNOSIS — Z8673 Personal history of transient ischemic attack (TIA), and cerebral infarction without residual deficits: Secondary | ICD-10-CM | POA: Diagnosis not present

## 2021-10-26 DIAGNOSIS — Z794 Long term (current) use of insulin: Secondary | ICD-10-CM | POA: Diagnosis not present

## 2021-10-26 DIAGNOSIS — K219 Gastro-esophageal reflux disease without esophagitis: Secondary | ICD-10-CM | POA: Diagnosis not present

## 2021-10-26 DIAGNOSIS — E041 Nontoxic single thyroid nodule: Secondary | ICD-10-CM | POA: Diagnosis not present

## 2021-10-26 DIAGNOSIS — Z853 Personal history of malignant neoplasm of breast: Secondary | ICD-10-CM | POA: Diagnosis not present

## 2021-10-26 DIAGNOSIS — E785 Hyperlipidemia, unspecified: Secondary | ICD-10-CM | POA: Diagnosis not present

## 2021-10-26 DIAGNOSIS — E119 Type 2 diabetes mellitus without complications: Secondary | ICD-10-CM | POA: Diagnosis not present

## 2021-10-26 DIAGNOSIS — Z01810 Encounter for preprocedural cardiovascular examination: Secondary | ICD-10-CM | POA: Diagnosis not present

## 2021-10-26 DIAGNOSIS — Z7902 Long term (current) use of antithrombotics/antiplatelets: Secondary | ICD-10-CM | POA: Diagnosis not present

## 2021-10-26 DIAGNOSIS — F32A Depression, unspecified: Secondary | ICD-10-CM | POA: Diagnosis not present

## 2021-10-31 DIAGNOSIS — J019 Acute sinusitis, unspecified: Secondary | ICD-10-CM | POA: Diagnosis not present

## 2021-10-31 DIAGNOSIS — J40 Bronchitis, not specified as acute or chronic: Secondary | ICD-10-CM | POA: Diagnosis not present

## 2021-12-06 DIAGNOSIS — Z8673 Personal history of transient ischemic attack (TIA), and cerebral infarction without residual deficits: Secondary | ICD-10-CM | POA: Diagnosis not present

## 2021-12-06 DIAGNOSIS — E7800 Pure hypercholesterolemia, unspecified: Secondary | ICD-10-CM | POA: Diagnosis not present

## 2021-12-06 DIAGNOSIS — E1169 Type 2 diabetes mellitus with other specified complication: Secondary | ICD-10-CM | POA: Diagnosis not present

## 2021-12-06 DIAGNOSIS — K30 Functional dyspepsia: Secondary | ICD-10-CM | POA: Diagnosis not present

## 2021-12-11 ENCOUNTER — Telehealth: Payer: Self-pay | Admitting: Gastroenterology

## 2021-12-11 NOTE — Telephone Encounter (Signed)
Returned call to patient. She reports that when she stopped the Protonix her symptoms returned. She restarted Protonix 20 mg daily and had no relief, so now pt is taking Protonix 20 mg BID but now has diarrhea. Pt reports that she will have several episodes of diarrhea every other day. She reports that she had 4 small episodes of diarrhea today. Pt has not tried any antidiarrheals. Please advise, thanks.  ?

## 2021-12-11 NOTE — Telephone Encounter (Signed)
She does not necessarily need to be on a PPI, especially if it is not clearly helping her dyspepsia and it may be causing diarrhea. ? ?Recommendation: ? ?Stop pantoprazole ? ?Famotidine 40 mg, 1 tablet twice daily ?Dispense 60 tablets, 1 refill ? ?She is welcome to make a clinic appointment to see me for further advice. ? ?- HD ? ? ?

## 2021-12-11 NOTE — Telephone Encounter (Signed)
Inbound call from patient requesting a call back to discuss pantoprazole. States it gives her diarrhea every other day ?

## 2021-12-12 ENCOUNTER — Other Ambulatory Visit: Payer: Self-pay

## 2021-12-12 ENCOUNTER — Other Ambulatory Visit: Payer: Medicare Other | Admitting: *Deleted

## 2021-12-12 DIAGNOSIS — E782 Mixed hyperlipidemia: Secondary | ICD-10-CM | POA: Diagnosis not present

## 2021-12-12 LAB — LIPID PANEL
Chol/HDL Ratio: 2.2 ratio (ref 0.0–4.4)
Cholesterol, Total: 116 mg/dL (ref 100–199)
HDL: 52 mg/dL (ref >39)
LDL Chol Calc (NIH): 50 mg/dL (ref 0–99)
Triglycerides: 65 mg/dL (ref 0–149)
VLDL Cholesterol Cal: 14 mg/dL (ref 5–40)

## 2021-12-12 LAB — HEPATIC FUNCTION PANEL
ALT: 18 IU/L (ref 0–32)
AST: 21 IU/L (ref 0–40)
Albumin: 4.1 g/dL (ref 3.8–4.8)
Alkaline Phosphatase: 80 IU/L (ref 44–121)
Bilirubin Total: 0.3 mg/dL (ref 0.0–1.2)
Bilirubin, Direct: 0.13 mg/dL (ref 0.00–0.40)
Total Protein: 6.4 g/dL (ref 6.0–8.5)

## 2021-12-12 MED ORDER — FAMOTIDINE 40 MG PO TABS: 40.0000 mg | ORAL_TABLET | Freq: Two times a day (BID) | ORAL | 1 refills | Status: DC

## 2021-12-12 NOTE — Telephone Encounter (Signed)
Lm on vm for patient to return call. ? ?RX for Famotidine sent to pharmacy on file. ?

## 2021-12-13 NOTE — Telephone Encounter (Signed)
Pt returned call. I have reviewed Dr. Loletha Carrow' recommendations with patient. Pt knows to stop Pantoprazole and begin Famotidine 40 mg BID. Pt has been scheduled for a follow up appt with Dr. Loletha Carrow on Tuesday, 01/30/22 at 9:20 am. Pt verbalized understanding and had no concerns at the end of the call. ?

## 2021-12-20 ENCOUNTER — Other Ambulatory Visit: Payer: Self-pay | Admitting: Neurology

## 2021-12-26 ENCOUNTER — Other Ambulatory Visit: Payer: Self-pay | Admitting: Neurology

## 2021-12-26 DIAGNOSIS — Z7901 Long term (current) use of anticoagulants: Secondary | ICD-10-CM | POA: Diagnosis not present

## 2021-12-26 DIAGNOSIS — T148XXA Other injury of unspecified body region, initial encounter: Secondary | ICD-10-CM | POA: Diagnosis not present

## 2021-12-26 DIAGNOSIS — R5383 Other fatigue: Secondary | ICD-10-CM | POA: Diagnosis not present

## 2021-12-29 ENCOUNTER — Other Ambulatory Visit: Payer: Self-pay | Admitting: Neurology

## 2022-01-02 ENCOUNTER — Other Ambulatory Visit: Payer: Self-pay | Admitting: Neurology

## 2022-01-04 ENCOUNTER — Telehealth: Payer: Self-pay | Admitting: Neurology

## 2022-01-04 NOTE — Telephone Encounter (Signed)
Pt came into the office stating her PCP wants Dr. Tomi Likens to decide if she can continue taking the Plavix. She is going out of town on Monday and would like to know what to do. She is having to go back and forth with both offices and is getting frustrated.  ?

## 2022-01-05 ENCOUNTER — Other Ambulatory Visit: Payer: Self-pay

## 2022-01-05 MED ORDER — CLOPIDOGREL BISULFATE 75 MG PO TABS: 75.0000 mg | ORAL_TABLET | Freq: Every day | ORAL | 0 refills | Status: DC

## 2022-01-05 NOTE — Telephone Encounter (Signed)
Sent 81 tablets to pharmacy, no refills given will discuss on follow up per Dr.Jaffe.  ?

## 2022-01-26 DIAGNOSIS — M654 Radial styloid tenosynovitis [de Quervain]: Secondary | ICD-10-CM | POA: Diagnosis not present

## 2022-01-26 DIAGNOSIS — M7712 Lateral epicondylitis, left elbow: Secondary | ICD-10-CM | POA: Diagnosis not present

## 2022-01-30 ENCOUNTER — Encounter: Payer: Self-pay | Admitting: Gastroenterology

## 2022-01-30 ENCOUNTER — Ambulatory Visit (INDEPENDENT_AMBULATORY_CARE_PROVIDER_SITE_OTHER): Payer: Medicare Other | Admitting: Gastroenterology

## 2022-01-30 VITALS — BP 108/56 | HR 81 | Ht 62.0 in | Wt 137.4 lb

## 2022-01-30 DIAGNOSIS — K30 Functional dyspepsia: Secondary | ICD-10-CM

## 2022-01-30 NOTE — Patient Instructions (Signed)
If you are age 69 or older, your body mass index should be between 23-30. Your Body mass index is 25.13 kg/m?Marland Kitchen If this is out of the aforementioned range listed, please consider follow up with your Primary Care Provider. ? ?If you are age 55 or younger, your body mass index should be between 19-25. Your Body mass index is 25.13 kg/m?Marland Kitchen If this is out of the aformentioned range listed, please consider follow up with your Primary Care Provider.  ? ?________________________________________________________ ? ?The [facility] providers would like to encourage you to use [facility] to communicate with providers for non-urgent requests or questions.  Due to long hold times on the telephone, sending your provider a message by [facility] may be a faster and more efficient way to get a response.  Please allow 48 business hours for a response.  Please remember that this is for non-urgent requests.  ?_______________________________________________________ ? ?It was a pleasure to see you today! ? ?Thank you for trusting me with your gastrointestinal care!   ? ? ?

## 2022-01-30 NOTE — Progress Notes (Signed)
? ? ? ? GI Progress Note ? ?Chief Complaint: Nonulcer dyspepsia ? ?Subjective  ?History: ?I saw Destiny Moody in the office November 2022 for chronic upper abdominal pain, characterized in that note.  Incidentally, she reported a lot of stress helping her daughter care for her new grandchild.  ? Upper endoscopy 08/04/2021 retained food in the stomach.  Patient on Victoza, but that had not been a new medicine prior to onset of symptoms by her report. ?At follow-up office visit 09/19/21, she was having some intermittent dyspeptic symptoms improved on PPI.  GES shortly before that showed normal emptying. ? ?Destiny Moody is generally feeling well.  She felt the PPI either did not agree with her, so it was recently changed to famotidine 40 mg, and she is taking it once daily.  Difficult to say if it is helping, but she thinks probably yes.  She also feels she is coping with stress better. ?Appetite good, weight stable, denies dysphagia or vomiting. ? ?ROS: ?Cardiovascular:  no chest pain ?Respiratory: no dyspnea ? ?The patient's Past Medical, Family and Social History were reviewed and are on file in the EMR. ? ?Objective: ? ?Med list reviewed ? ?Current Outpatient Medications:  ?  amitriptyline (ELAVIL) 25 MG tablet, TAKE ONE TABLET BY MOUTH AT BEDTIME  "OFFICE VISIT NEEDED", Disp: 90 tablet, Rfl: 3 ?  Blood Glucose Monitoring Suppl (ONETOUCH VERIO FLEX SYSTEM) w/Device KIT, , Disp: , Rfl:  ?  Calcium Carbonate-Vitamin D 600-200 MG-UNIT TABS, Take 1 tablet by mouth daily. , Disp: , Rfl:  ?  clopidogrel (PLAVIX) 75 MG tablet, Take 1 tablet (75 mg total) by mouth daily., Disp: 81 tablet, Rfl: 0 ?  DULoxetine (CYMBALTA) 30 MG capsule, 1 capsule, Disp: , Rfl:  ?  famotidine (PEPCID) 40 MG tablet, Take 1 tablet (40 mg total) by mouth 2 (two) times daily before a meal., Disp: 60 tablet, Rfl: 1 ?  Glucosamine 500 MG CAPS, Take 1 capsule by mouth daily., Disp: , Rfl:  ?  hydrocortisone (ANUSOL-HC) 2.5 % rectal cream, Place 1  application rectally 2 (two) times daily. Make sure cream is inside. (Patient taking differently: Place 1 application. rectally daily as needed for hemorrhoids. Make sure cream is inside.), Disp: 30 g, Rfl: 1 ?  ibandronate (BONIVA) 150 MG tablet, Take 1 tablet (150 mg total) by mouth every 30 (thirty) days. Take in the morning with a full glass of water, on an empty stomach, and do not take anything else by mouth or lie down for the next 30 min., Disp: 3 tablet, Rfl: 2 ?  liraglutide (VICTOZA) 18 MG/3ML SOPN, Inject 1.2 mg into the skin daily., Disp: , Rfl:  ?  lisinopril (ZESTRIL) 2.5 MG tablet, Take 2.5 mg by mouth daily., Disp: , Rfl:  ?  metFORMIN (GLUCOPHAGE-XR) 500 MG 24 hr tablet, TAKE TWO TABLETS BY MOUTH TWICE DAILY (Patient taking differently: Take 1,000 mg by mouth in the morning and at bedtime.), Disp: 360 tablet, Rfl: 0 ?  Multiple Vitamin (MULTIVITAMIN) capsule, Take 1 capsule by mouth daily., Disp: , Rfl:  ?  Omega-3 Fatty Acids (OMEGA-3 FISH OIL PO), Take 1 capsule by mouth daily. , Disp: , Rfl:  ?  oxybutynin (DITROPAN) 5 MG tablet, Take 1 tablet (5 mg total) by mouth 2 (two) times daily., Disp: 60 tablet, Rfl: 0 ?  pioglitazone (ACTOS) 45 MG tablet, Take 45 mg by mouth daily., Disp: , Rfl:  ?  ReliOn Ultra Thin Lancets MISC, See admin instructions., Disp: , Rfl:  ?  rosuvastatin (CRESTOR) 20 MG tablet, Take 1 tablet (20 mg total) by mouth daily., Disp: 90 tablet, Rfl: 3 ?  traMADol (ULTRAM) 50 MG tablet, Take 1 tablet (50 mg total) by mouth every 6 (six) hours as needed., Disp: 10 tablet, Rfl: 0 ?  triamcinolone cream (KENALOG) 0.1 %, Apply 1 application topically daily as needed (dry skin)., Disp: , Rfl:  ?  TURMERIC PO, Take 1 capsule by mouth daily., Disp: , Rfl:  ? ? ?Vital signs in last 24 hrs: ?Vitals:  ? 01/30/22 0917  ?BP: (!) 108/56  ?Pulse: 81  ? ?Wt Readings from Last 3 Encounters:  ?01/30/22 137 lb 6 oz (62.3 kg)  ?09/19/21 139 lb 6 oz (63.2 kg)  ?09/13/21 138 lb (62.6 kg)  ?   ?Physical Exam ? ?Well-appearing ? ?Cardiac: RRR without murmurs, S1S2 heard, no peripheral edema ?Pulm: clear to auscultation bilaterally, normal RR and effort noted ?Abdomen: soft, no tenderness, with active bowel sounds. No guarding or palpable hepatosplenomegaly. ? ?Labs: ? ? ?___________________________________________ ?Radiologic studies: ? ? ?____________________________________________ ?Other: ? ? ?_____________________________________________ ?Assessment & Plan  ?Assessment: ?Encounter Diagnosis  ?Name Primary?  ? Nonulcer dyspepsia Yes  ? ?She says the most helpful thing lately has been taking apple cider vinegar.  Difficult to say if the famotidine is helping, so I recommended she stop it and see what happens.  She can always resume it if she develops dyspepsia or heartburn. ? ?No further testing planned, and she plans to see me as needed. ? ? ?[name] ? ?

## 2022-02-02 DIAGNOSIS — E119 Type 2 diabetes mellitus without complications: Secondary | ICD-10-CM | POA: Diagnosis not present

## 2022-02-07 DIAGNOSIS — M25522 Pain in left elbow: Secondary | ICD-10-CM | POA: Diagnosis not present

## 2022-02-13 DIAGNOSIS — H938X3 Other specified disorders of ear, bilateral: Secondary | ICD-10-CM | POA: Diagnosis not present

## 2022-02-13 DIAGNOSIS — L299 Pruritus, unspecified: Secondary | ICD-10-CM | POA: Diagnosis not present

## 2022-02-13 DIAGNOSIS — J3489 Other specified disorders of nose and nasal sinuses: Secondary | ICD-10-CM | POA: Diagnosis not present

## 2022-02-13 DIAGNOSIS — R0989 Other specified symptoms and signs involving the circulatory and respiratory systems: Secondary | ICD-10-CM | POA: Diagnosis not present

## 2022-02-15 ENCOUNTER — Emergency Department (HOSPITAL_COMMUNITY)
Admission: EM | Admit: 2022-02-15 | Discharge: 2022-02-16 | Disposition: A | Discharge status: Home / Self Care | Payer: Medicare Other | Attending: Emergency Medicine | Admitting: Emergency Medicine

## 2022-02-15 DIAGNOSIS — I10 Essential (primary) hypertension: Secondary | ICD-10-CM | POA: Insufficient documentation

## 2022-02-15 DIAGNOSIS — E1165 Type 2 diabetes mellitus with hyperglycemia: Secondary | ICD-10-CM | POA: Insufficient documentation

## 2022-02-15 DIAGNOSIS — R739 Hyperglycemia, unspecified: Secondary | ICD-10-CM

## 2022-02-15 DIAGNOSIS — Z7984 Long term (current) use of oral hypoglycemic drugs: Secondary | ICD-10-CM | POA: Diagnosis not present

## 2022-02-15 DIAGNOSIS — E871 Hypo-osmolality and hyponatremia: Secondary | ICD-10-CM | POA: Insufficient documentation

## 2022-02-15 DIAGNOSIS — Z79899 Other long term (current) drug therapy: Secondary | ICD-10-CM | POA: Insufficient documentation

## 2022-02-15 DIAGNOSIS — M25521 Pain in right elbow: Secondary | ICD-10-CM | POA: Diagnosis not present

## 2022-02-15 DIAGNOSIS — E872 Acidosis, unspecified: Secondary | ICD-10-CM | POA: Diagnosis not present

## 2022-02-15 DIAGNOSIS — Z7901 Long term (current) use of anticoagulants: Secondary | ICD-10-CM | POA: Diagnosis not present

## 2022-02-16 ENCOUNTER — Other Ambulatory Visit: Payer: Self-pay

## 2022-02-16 ENCOUNTER — Encounter (HOSPITAL_COMMUNITY): Payer: Self-pay

## 2022-02-16 DIAGNOSIS — E1165 Type 2 diabetes mellitus with hyperglycemia: Secondary | ICD-10-CM | POA: Diagnosis not present

## 2022-02-16 LAB — BASIC METABOLIC PANEL
Anion gap: 12 (ref 5–15)
BUN: 26 mg/dL — ABNORMAL HIGH (ref 8–23)
CO2: 17 mmol/L — ABNORMAL LOW (ref 22–32)
Calcium: 9.5 mg/dL (ref 8.9–10.3)
Chloride: 104 mmol/L (ref 98–111)
Creatinine, Ser: 0.98 mg/dL (ref 0.44–1.00)
GFR, Estimated: >60 mL/min (ref >60)
Glucose, Bld: 429 mg/dL — ABNORMAL HIGH (ref 70–99)
Potassium: 4.5 mmol/L (ref 3.5–5.1)
Sodium: 133 mmol/L — ABNORMAL LOW (ref 135–145)

## 2022-02-16 LAB — URINALYSIS, ROUTINE W REFLEX MICROSCOPIC
Bacteria, UA: NONE SEEN
Bilirubin Urine: NEGATIVE
Glucose, UA: >=500 mg/dL — AB
Hgb urine dipstick: NEGATIVE
Ketones, ur: 5 mg/dL — AB
Leukocytes,Ua: NEGATIVE
Nitrite: NEGATIVE
Protein, ur: NEGATIVE mg/dL
Specific Gravity, Urine: 1.025 (ref 1.005–1.030)
pH: 5 (ref 5.0–8.0)

## 2022-02-16 LAB — CBC WITH DIFFERENTIAL/PLATELET
Abs Immature Granulocytes: 0.04 10*3/uL (ref 0.00–0.07)
Basophils Absolute: 0 10*3/uL (ref 0.0–0.1)
Basophils Relative: 0 %
Eosinophils Absolute: 0 10*3/uL (ref 0.0–0.5)
Eosinophils Relative: 0 %
HCT: 35.8 % — ABNORMAL LOW (ref 36.0–46.0)
Hemoglobin: 12 g/dL (ref 12.0–15.0)
Immature Granulocytes: 0 %
Lymphocytes Relative: 5 %
Lymphs Abs: 0.5 10*3/uL — ABNORMAL LOW (ref 0.7–4.0)
MCH: 32 pg (ref 26.0–34.0)
MCHC: 33.5 g/dL (ref 30.0–36.0)
MCV: 95.5 fL (ref 80.0–100.0)
Monocytes Absolute: 0.1 10*3/uL (ref 0.1–1.0)
Monocytes Relative: 1 %
Neutro Abs: 9.6 10*3/uL — ABNORMAL HIGH (ref 1.7–7.7)
Neutrophils Relative %: 94 %
Platelets: 264 10*3/uL (ref 150–400)
RBC: 3.75 MIL/uL — ABNORMAL LOW (ref 3.87–5.11)
RDW: 12.8 % (ref 11.5–15.5)
WBC: 10.2 10*3/uL (ref 4.0–10.5)
nRBC: 0 % (ref 0.0–0.2)

## 2022-02-16 LAB — BLOOD GAS, VENOUS
Acid-base deficit: 4.9 mmol/L — ABNORMAL HIGH (ref 0.0–2.0)
Bicarbonate: 17.9 mmol/L — ABNORMAL LOW (ref 20.0–28.0)
O2 Saturation: 99.4 %
Patient temperature: 37
pCO2, Ven: 27 mmHg — ABNORMAL LOW (ref 44–60)
pH, Ven: 7.43 (ref 7.25–7.43)
pO2, Ven: 122 mmHg — ABNORMAL HIGH (ref 32–45)

## 2022-02-16 LAB — CBG MONITORING, ED
Glucose-Capillary: 318 mg/dL — ABNORMAL HIGH (ref 70–99)
Glucose-Capillary: 451 mg/dL — ABNORMAL HIGH (ref 70–99)

## 2022-02-16 MED ORDER — ACETAMINOPHEN 325 MG PO TABS
650.0000 mg | ORAL_TABLET | Freq: Once | ORAL | Status: AC
  Administered 2022-02-16: 650 mg via ORAL
  Filled 2022-02-16: qty 2

## 2022-02-16 MED ORDER — LACTATED RINGERS IV BOLUS
1000.0000 mL | Freq: Once | INTRAVENOUS | Status: AC
  Administered 2022-02-16: 1000 mL via INTRAVENOUS

## 2022-02-16 MED ORDER — INSULIN ASPART 100 UNIT/ML IV SOLN
5.0000 [IU] | Freq: Once | INTRAVENOUS | Status: AC
  Administered 2022-02-16: 5 [IU] via INTRAVENOUS
  Filled 2022-02-16: qty 0.05

## 2022-02-16 NOTE — ED Notes (Signed)
I provided reinforced discharge education based off of discharge instructions. Pt acknowledged and understood my education. Pt had no further questions/concerns for provider/myself.  °

## 2022-02-16 NOTE — ED Provider Notes (Signed)
[facility] DEPT Provider Note   CSN: [id number] Arrival date & time: 02/15/22  2358     History  Chief complaint: Elevated blood sugar  Destiny Moody is a 71 y.o. female.  The history is provided by the patient.  She has history of hypertension, diabetes, hyperlipidemia and comes in because of elevated blood sugar at home.  She received a steroid injection in her left elbow this afternoon, and had had problems with hyperglycemia following steroid injections in the past.  Glucose at home got as high as 543.  She does admit to slight blurring of vision and some urinary frequency.  She denies fever or chills.   Home Medications Prior to Admission medications   Medication Sig Start Date End Date Taking? Authorizing Provider  amitriptyline (ELAVIL) 25 MG tablet TAKE ONE TABLET BY MOUTH AT BEDTIME  "OFFICE VISIT NEEDED" 10/18/15   Copland, Gay Filler, MD  Blood Glucose Monitoring Suppl ([place]) w/Device KIT  06/03/19   [provider]  Calcium Carbonate-Vitamin D 600-200 MG-UNIT TABS Take 1 tablet by mouth daily.     [provider]  clopidogrel (PLAVIX) 75 MG tablet Take 1 tablet (75 mg total) by mouth daily. 01/05/22   Pieter Partridge, DO  DULoxetine (CYMBALTA) 30 MG capsule 1 capsule    [provider]  famotidine (PEPCID) 40 MG tablet Take 1 tablet (40 mg total) by mouth 2 (two) times daily before a meal. 12/12/21   Danis, Estill Cotta III, MD  Glucosamine 500 MG CAPS Take 1 capsule by mouth daily.    [provider]  hydrocortisone (ANUSOL-HC) 2.5 % rectal cream Place 1 application rectally 2 (two) times daily. Make sure cream is inside. Patient taking differently: Place 1 application. rectally daily as needed for hemorrhoids. Make sure cream is inside. 05/21/19   Willia Craze, NP  ibandronate (BONIVA) 150 MG tablet Take 1 tablet (150 mg total) by mouth every 30 (thirty) days. Take in the morning with a full  glass of water, on an empty stomach, and do not take anything else by mouth or lie down for the next 30 min. 07/18/21   Princess Bruins, MD  liraglutide (VICTOZA) 18 MG/3ML SOPN Inject 1.2 mg into the skin daily.    [provider]  lisinopril (ZESTRIL) 2.5 MG tablet Take 2.5 mg by mouth daily. 07/07/19   [provider]  metFORMIN (GLUCOPHAGE-XR) 500 MG 24 hr tablet TAKE TWO TABLETS BY MOUTH TWICE DAILY Patient taking differently: Take 1,000 mg by mouth in the morning and at bedtime. 10/28/16   Forrest Moron, MD  Multiple Vitamin (MULTIVITAMIN) capsule Take 1 capsule by mouth daily.    [provider]  Omega-3 Fatty Acids (OMEGA-3 FISH OIL PO) Take 1 capsule by mouth daily.     [provider]  oxybutynin (DITROPAN) 5 MG tablet Take 1 tablet (5 mg total) by mouth 2 (two) times daily. 08/31/19   Blain Pais, MD  pioglitazone (ACTOS) 45 MG tablet Take 45 mg by mouth daily. 06/12/21   [provider]  ReliOn Ultra Thin Lancets MISC See admin instructions. 01/25/20   [provider]  rosuvastatin (CRESTOR) 20 MG tablet Take 1 tablet (20 mg total) by mouth daily. 09/13/21   Jettie Booze, MD  traMADol (ULTRAM) 50 MG tablet Take 1 tablet (50 mg total) by mouth every 6 (six) hours as needed. 07/20/21   Veryl Speak, MD  triamcinolone cream (KENALOG) 0.1 %  Apply 1 application topically daily as needed (dry skin). 03/26/19   [provider]  TURMERIC PO Take 1 capsule by mouth daily.    [provider]      Allergies    Shellfish allergy    Review of Systems   Review of Systems  All other systems reviewed and are negative.  Physical Exam Updated Vital Signs BP 121/82 (BP Location: Left Arm)   Pulse 100   Temp 98.3 F (36.8 C) (Oral)   Resp 18   SpO2 98%  Physical Exam Vitals and nursing note reviewed.  71 year old female, resting comfortably and in no acute distress. Vital signs are normal. Oxygen  saturation is 98%, which is normal. Head is normocephalic and atraumatic. PERRLA, EOMI. Oropharynx is clear. Neck is nontender and supple without adenopathy or JVD. Back is nontender and there is no CVA tenderness. Lungs are clear without rales, wheezes, or rhonchi. Chest is nontender. Heart has regular rate and rhythm without murmur. Abdomen is soft, flat, nontender. Extremities have no cyanosis or edema, full range of motion is present. Skin is warm and dry without rash. Neurologic: Mental status is normal, cranial nerves are intact, moves all extremities equally.  ED Results / Procedures / Treatments   Labs (all labs ordered are listed, but only abnormal results are displayed) Labs Reviewed  BASIC METABOLIC PANEL - Abnormal; Notable for the following components:      Result Value   Sodium 133 (*)    CO2 17 (*)    Glucose, Bld 429 (*)    BUN 26 (*)    All other components within normal limits  CBC WITH DIFFERENTIAL/PLATELET - Abnormal; Notable for the following components:   RBC 3.75 (*)    HCT 35.8 (*)    Neutro Abs 9.6 (*)    Lymphs Abs 0.5 (*)    All other components within normal limits  URINALYSIS, ROUTINE W REFLEX MICROSCOPIC - Abnormal; Notable for the following components:   Color, Urine STRAW (*)    Glucose, UA >=500 (*)    Ketones, ur 5 (*)    All other components within normal limits  BLOOD GAS, VENOUS - Abnormal; Notable for the following components:   pCO2, Ven 27 (*)    pO2, Ven 122 (*)    Bicarbonate 17.9 (*)    Acid-base deficit 4.9 (*)    All other components within normal limits  CBG MONITORING, ED - Abnormal; Notable for the following components:   Glucose-Capillary 451 (*)    All other components within normal limits  CBG MONITORING, ED - Abnormal; Notable for the following components:   Glucose-Capillary 318 (*)    All other components within normal limits   Procedures Procedures    Medications Ordered in ED Medications  lactated ringers  bolus 1,000 mL (has no administration in time range)  insulin aspart (novoLOG) injection 5 Units (has no administration in time range)    ED Course/ Medical Decision Making/ A&P                           Medical Decision Making Amount and/or Complexity of Data Reviewed Labs: ordered.  Risk OTC drugs.   Hyperglycemia secondary to steroid injection.  Old records are reviewed, and she was seen in the emergency department on 12/31/2019 with hyperglycemia following an epidural steroid injection.  She will be given IV fluids, insulin.  We will check urinalysis and metabolic panel to make  sure that she is not in ketoacidosis, although that does not seem likely based on physical exam.  I have reviewed and interpreted all of the lab tests.  Metabolic acidosis is noted with CO2 of 17, but normal anion gap.  Doubt ketoacidosis, but venous blood gas was obtained showing normal pH, confirming no ketoacidosis.  CBC is normal.  Urinalysis did show minimal amount of ketones, but once again, no evidence of ketoacidosis.  After IV fluids and insulin, glucoses come down to 318.  She is felt to be safe for discharge at this point.  Final Clinical Impression(s) / ED Diagnoses Final diagnoses:  Hyperglycemia  Hyponatremia    Rx / DC Orders ED Discharge Orders     None         Delora Fuel, MD 35/68/61 [phone]

## 2022-02-16 NOTE — Discharge Instructions (Addendum)
Your blood sugar went up because of the steroid injection.  As the steroid wears off, your blood sugar should come back down to its usual level.  Return to the emergency department if you are having any problems.

## 2022-02-16 NOTE — ED Triage Notes (Signed)
Pt presents to ED with c/o high blood sugar. Pt states she takes Victoza and Metformin but has still had elevated readings today. Pt endorses headache and dizziness.

## 2022-02-21 DIAGNOSIS — I10 Essential (primary) hypertension: Secondary | ICD-10-CM | POA: Diagnosis not present

## 2022-02-21 DIAGNOSIS — K219 Gastro-esophageal reflux disease without esophagitis: Secondary | ICD-10-CM | POA: Diagnosis not present

## 2022-02-21 DIAGNOSIS — M810 Age-related osteoporosis without current pathological fracture: Secondary | ICD-10-CM | POA: Diagnosis not present

## 2022-02-21 DIAGNOSIS — E7800 Pure hypercholesterolemia, unspecified: Secondary | ICD-10-CM | POA: Diagnosis not present

## 2022-02-21 DIAGNOSIS — E890 Postprocedural hypothyroidism: Secondary | ICD-10-CM | POA: Diagnosis not present

## 2022-02-21 DIAGNOSIS — E1165 Type 2 diabetes mellitus with hyperglycemia: Secondary | ICD-10-CM | POA: Diagnosis not present

## 2022-03-13 DIAGNOSIS — M654 Radial styloid tenosynovitis [de Quervain]: Secondary | ICD-10-CM | POA: Diagnosis not present

## 2022-03-13 DIAGNOSIS — M25522 Pain in left elbow: Secondary | ICD-10-CM | POA: Diagnosis not present

## 2022-03-13 DIAGNOSIS — M7712 Lateral epicondylitis, left elbow: Secondary | ICD-10-CM | POA: Diagnosis not present

## 2022-03-15 ENCOUNTER — Other Ambulatory Visit (HOSPITAL_COMMUNITY)
Admission: RE | Admit: 2022-03-15 | Discharge: 2022-03-15 | Disposition: A | Discharge status: Home / Self Care | Payer: Medicare Other | Source: Ambulatory Visit | Attending: Obstetrics & Gynecology | Admitting: Obstetrics & Gynecology

## 2022-03-15 ENCOUNTER — Encounter: Payer: Self-pay | Admitting: Obstetrics & Gynecology

## 2022-03-15 ENCOUNTER — Ambulatory Visit (INDEPENDENT_AMBULATORY_CARE_PROVIDER_SITE_OTHER): Payer: Medicare Other | Admitting: Obstetrics & Gynecology

## 2022-03-15 VITALS — BP 108/60 | HR 76 | Ht 61.75 in | Wt 134.0 lb

## 2022-03-15 DIAGNOSIS — Z01419 Encounter for gynecological examination (general) (routine) without abnormal findings: Secondary | ICD-10-CM

## 2022-03-15 DIAGNOSIS — M810 Age-related osteoporosis without current pathological fracture: Secondary | ICD-10-CM

## 2022-03-15 DIAGNOSIS — Z124 Encounter for screening for malignant neoplasm of cervix: Secondary | ICD-10-CM

## 2022-03-15 DIAGNOSIS — Z9189 Other specified personal risk factors, not elsewhere classified: Secondary | ICD-10-CM | POA: Diagnosis not present

## 2022-03-15 DIAGNOSIS — Z780 Asymptomatic menopausal state: Secondary | ICD-10-CM | POA: Diagnosis not present

## 2022-03-15 DIAGNOSIS — Z853 Personal history of malignant neoplasm of breast: Secondary | ICD-10-CM

## 2022-03-15 DIAGNOSIS — A6009 Herpesviral infection of other urogenital tract: Secondary | ICD-10-CM

## 2022-03-15 NOTE — Progress Notes (Signed)
Destiny Moody 1951-04-07 [id number]   History:    71 y.o.  G2P2L2 Married   RP:  Established patient presenting for annual gyn exam    HPI: Postmenopausal, on no HRT.  No PMB.  No pelvic pain. Pap smear negative 01/2019.  Pap reflex today.  History ER/PR positive right breast cancer.  Breasts normal.  Mammo 06/2021 Neg. Colonoscopy 06/2019.  DEXA 03/2021.  T score -2.9 at Rt femoral neck.  On Boniva on-off x 10 years.  Will stop Boniva and change to Prolia.  Health labs with Fam MD.  BMI 24.71.    Past medical history,surgical history, family history and social history were all reviewed and documented in the EPIC chart.  Gynecologic History No LMP recorded. Patient is postmenopausal.  Obstetric History OB History  Gravida Para Term Preterm AB Living  2 2 0     2  SAB IAB Ectopic Multiple Live Births               # Outcome Date GA Lbr Len/2nd Weight Sex Delivery Anes PTL Lv  2 Para      CS-LTranv     1 Para      CS-LTranv        ROS: A ROS was performed and pertinent positives and negatives are included in the history.  GENERAL: No fevers or chills. HEENT: No change in vision, no earache, sore throat or sinus congestion. NECK: No pain or stiffness. CARDIOVASCULAR: No chest pain or pressure. No palpitations. PULMONARY: No shortness of breath, cough or wheeze. GASTROINTESTINAL: No abdominal pain, nausea, vomiting or diarrhea, melena or bright red blood per rectum. GENITOURINARY: No urinary frequency, urgency, hesitancy or dysuria. MUSCULOSKELETAL: No joint or muscle pain, no back pain, no recent trauma. DERMATOLOGIC: No rash, no itching, no lesions. ENDOCRINE: No polyuria, polydipsia, no heat or cold intolerance. No recent change in weight. HEMATOLOGICAL: No anemia or easy bruising or bleeding. NEUROLOGIC: No headache, seizures, numbness, tingling or weakness. PSYCHIATRIC: No depression, no loss of interest in normal activity or change in sleep pattern.     Exam:   BP 108/60    Pulse 76   Ht 5' 1.75" (1.568 m)   Wt 134 lb (60.8 kg)   SpO2 99%   BMI 24.71 kg/m   Body mass index is 24.71 kg/m.  General appearance : Well developed well nourished female. No acute distress HEENT: Eyes: no retinal hemorrhage or exudates,  Neck supple, trachea midline, no carotid bruits, no thyroidmegaly Lungs: Clear to auscultation, no rhonchi or wheezes, or rib retractions  Heart: Regular rate and rhythm, no murmurs or gallops Breast:Examined in sitting and supine position were symmetrical in appearance, no palpable masses or tenderness,  no skin retraction, no nipple inversion, no nipple discharge, no skin discoloration, no axillary or supraclavicular lymphadenopathy Abdomen: no palpable masses or tenderness, no rebound or guarding Extremities: no edema or skin discoloration or tenderness  Pelvic: Vulva: Normal             Vagina: No gross lesions or discharge  Cervix: No gross lesions or discharge.  Pap reflex done.  Uterus  AV, normal size, shape and consistency, non-tender and mobile  Adnexa  Without masses or tenderness  Anus: Normal   Assessment/Plan:  71 y.o. female for annual exam   1. Encounter for routine gynecological examination with Papanicolaou smear of cervix Postmenopausal, on no HRT.  No PMB.  No pelvic pain. Pap smear negative 01/2019.  Pap reflex today.  History ER/PR positive right breast cancer.  Breasts normal.  Mammo 06/2021 Neg. Colonoscopy 06/2019.  DEXA 03/2021.  T score -2.9 at Rt femoral neck.  On Boniva on-off x 10 years.  Will stop Boniva and change to Prolia.  Health labs with Fam MD.  BMI 24.71. - Cytology - PAP( [place])  2. Postmenopause Postmenopausal, on no HRT.  No PMB.  No pelvic pain  3. Osteoporosis, unspecified osteoporosis type, unspecified pathological fracture presence  DEXA 03/2021.  T score -2.9 at Rt femoral neck.  On Boniva on-off x 10 years.  Will stop Boniva and change to Prolia.  BMI 24.71.  Vit D, Ca++ 1.5 g/d total and  regular weight bearing physical activities. - Cytology - PAP( [place]) 4. Personal history of breast cancer  Other orders - glimepiride (AMARYL) 2 MG tablet; Take 2 mg by mouth daily. - levothyroxine (SYNTHROID) 25 MCG tablet; Take 25 mcg by mouth every morning.   Princess Bruins MD, 11:37 AM 03/15/2022

## 2022-03-16 LAB — CYTOLOGY - PAP: Diagnosis: NEGATIVE

## 2022-03-18 ENCOUNTER — Other Ambulatory Visit: Payer: Self-pay | Admitting: Neurology

## 2022-03-19 NOTE — Telephone Encounter (Signed)
Called patient and left a message for a call back. Need to call patient and ask if her PCP has agreed to fill her Plavix. See last telephone encounter in regards to Plavix.

## 2022-03-21 ENCOUNTER — Other Ambulatory Visit: Payer: Self-pay | Admitting: Gastroenterology

## 2022-03-21 ENCOUNTER — Telehealth: Payer: Self-pay | Admitting: *Deleted

## 2022-03-21 DIAGNOSIS — Z7901 Long term (current) use of anticoagulants: Secondary | ICD-10-CM | POA: Diagnosis not present

## 2022-03-21 DIAGNOSIS — Z853 Personal history of malignant neoplasm of breast: Secondary | ICD-10-CM | POA: Diagnosis not present

## 2022-03-21 DIAGNOSIS — E038 Other specified hypothyroidism: Secondary | ICD-10-CM | POA: Diagnosis not present

## 2022-03-21 DIAGNOSIS — R1311 Dysphagia, oral phase: Secondary | ICD-10-CM | POA: Diagnosis not present

## 2022-03-21 DIAGNOSIS — J392 Other diseases of pharynx: Secondary | ICD-10-CM | POA: Diagnosis not present

## 2022-03-21 DIAGNOSIS — E1169 Type 2 diabetes mellitus with other specified complication: Secondary | ICD-10-CM | POA: Diagnosis not present

## 2022-03-21 DIAGNOSIS — M810 Age-related osteoporosis without current pathological fracture: Secondary | ICD-10-CM | POA: Diagnosis not present

## 2022-03-21 NOTE — Telephone Encounter (Signed)
-----   Message from Princess Bruins, MD sent at 03/15/2022 11:55 AM EDT ----- Regarding: Start on Prolia Osteoporosis.  On Boniva on-off x 10 years.  Stop Boniva and start on Prolia.

## 2022-03-21 NOTE — Telephone Encounter (Signed)
Called patient and left a message for a call back.  

## 2022-03-21 NOTE — Telephone Encounter (Signed)
Prolia insurance verification has been sent awaiting Summary of benefits  

## 2022-03-26 ENCOUNTER — Ambulatory Visit (INDEPENDENT_AMBULATORY_CARE_PROVIDER_SITE_OTHER): Payer: Medicare Other | Admitting: Gastroenterology

## 2022-03-26 ENCOUNTER — Encounter: Payer: Self-pay | Admitting: Gastroenterology

## 2022-03-26 VITALS — BP 110/70 | HR 81 | Ht 61.75 in | Wt 135.0 lb

## 2022-03-26 DIAGNOSIS — R1314 Dysphagia, pharyngoesophageal phase: Secondary | ICD-10-CM | POA: Diagnosis not present

## 2022-03-26 DIAGNOSIS — R0989 Other specified symptoms and signs involving the circulatory and respiratory systems: Secondary | ICD-10-CM

## 2022-03-26 NOTE — Progress Notes (Unsigned)
NEUROLOGY FOLLOW UP OFFICE NOTE  Kytzia Gienger [id number]  Assessment/Plan:   Transient ischemic attack Type 2 diabetes mellitus Hypertension Hyperlipidemia    1   Secondary stroke prevention as managed by PCP:             - Due to bruising, may discontinue Plavix.  Restart ASA $RemoveBef'81mg'XWcqRyGuNX$  daily             - Statin.  LDL goal less than 70             - Normotensive blood pressure             - Hgb A1c goal less than 7 2    Mediterranean diet 3    Routine exercise 4    Follow up as needed.     Subjective:  Dejanee Thibeaux is a 71 year old right-handed female with DM II, migraines and history of breast cancer s/p lumpectomy and radiation who follows up for TIA.  UPDATE: Current medications:  Plavix $Remov'75mg'mNcoTX$  daily, Amaryl, lisinopril, metformin, Actos, rosumvastatin  2D echocardiogram on 09/07/2021 showed EF 60-65% with no valvular disease or interatrial shunt.  Carotid doppler on 10/10/2021 showed eccentric atherosclerotic plaque in the proximal right ICA resulting in less than 50% stenosis and no other hemodynamically significant stenosis.  30 day cardiac event monitor in January revealed no a fib. LDL from 12/12/2021 was 50.  After starting Plavix, she started experiencing hematomas.    HISTORY: In August 2022, she developed left-sided numbness of face, arm and leg with some left arm and leg weakness lasting 5 minutes.  She was on vacation at the time.  She saw her PCP when she came home.  Carotid ultrasound on 08/08/2021 showed no hemodynamically significant ICA stenosis.  MRI of brain with and without contrast on 08/24/2021 personally reviewed showed chronic small vessel ischemic changes in the cerebral white matter but no acute/subacute stroke.  MRA of head personally reviewed was negative.   10-15 years ago, she had 2 other TIAs.  One time she lost vision for 5-10 minutes.  Another time, she had a severe headache.  She has been on ASA $Remo'81mg'SOuxf$  for over 10 years.    PAST MEDICAL  HISTORY: Past Medical History:  Diagnosis Date   Allergy    Arthritis    Breast cancer ([place]) 08/24/14   right, upper inner   Cancer ([place])    Chronic kidney disease    Depression    no meds   HSV (herpes simplex virus) anogenital infection 2018   Hypercholesterolemia    Hypertension    Hypothyroidism    Multinodular thyroid    NIDDM (non-insulin dependent diabetes mellitus)    Osteoporosis 2020   T score -2.5 stable from prior DEXA   Radiation 01/31/15-03/01/15   right  breast   Rheumatoid arthritis(714.0)    lower back   Wears contact lenses     MEDICATIONS: Current Outpatient Medications on File Prior to Visit  Medication Sig Dispense Refill   amitriptyline (ELAVIL) 25 MG tablet TAKE ONE TABLET BY MOUTH AT BEDTIME  "OFFICE VISIT NEEDED" 90 tablet 3   Blood Glucose Monitoring Suppl (ONETOUCH VERIO FLEX SYSTEM) w/Device KIT      Calcium Carbonate-Vitamin D 600-200 MG-UNIT TABS Take 1 tablet by mouth daily.      clopidogrel (PLAVIX) 75 MG tablet Take 1 tablet (75 mg total) by mouth daily. 81 tablet 0   DULoxetine (CYMBALTA) 30 MG capsule 1 capsule     glimepiride (AMARYL)  2 MG tablet Take 2 mg by mouth daily.     Glucosamine 500 MG CAPS Take 1 capsule by mouth daily.     ibandronate (BONIVA) 150 MG tablet Take 1 tablet (150 mg total) by mouth every 30 (thirty) days. Take in the morning with a full glass of water, on an empty stomach, and do not take anything else by mouth or lie down for the next 30 min. 3 tablet 2   levothyroxine (SYNTHROID) 25 MCG tablet Take 25 mcg by mouth every morning.     liraglutide (VICTOZA) 18 MG/3ML SOPN Inject 1.2 mg into the skin daily.     lisinopril (ZESTRIL) 2.5 MG tablet Take 2.5 mg by mouth daily.     metFORMIN (GLUCOPHAGE-XR) 500 MG 24 hr tablet TAKE TWO TABLETS BY MOUTH TWICE DAILY (Patient taking differently: Take 1,000 mg by mouth in the morning and at bedtime.) 360 tablet 0   Multiple Vitamin (MULTIVITAMIN) capsule Take 1 capsule by mouth  daily.     Omega-3 Fatty Acids (OMEGA-3 FISH OIL PO) Take 1 capsule by mouth daily.      pioglitazone (ACTOS) 45 MG tablet Take 45 mg by mouth daily.     rosuvastatin (CRESTOR) 20 MG tablet Take 1 tablet (20 mg total) by mouth daily. 90 tablet 3   TURMERIC PO Take 1 capsule by mouth daily.     No current facility-administered medications on file prior to visit.    ALLERGIES: Allergies  Allergen Reactions   Shellfish Allergy Hives    unsure    FAMILY HISTORY: Family History  Problem Relation Age of Onset   Diabetes Mother        niddm   Heart disease Mother    Heart failure Father    Heart disease Father    Hyperlipidemia Sister    Colon cancer Maternal Uncle 64   Ovarian cancer Cousin 37      Objective:  Blood pressure 129/71, pulse 77, height 5' 1.5" (1.562 m), weight 135 lb (61.2 kg), SpO2 98 %. General: No acute distress.  Patient appears well-groomed.   Head:  Normocephalic/atraumatic Eyes:  Fundi examined but not visualized Neck: supple, no paraspinal tenderness, full range of motion Heart:  Regular rate and rhythm Neurological Exam: alert and oriented to person, place, and time.  Speech fluent and not dysarthric, language intact.  CN II-XII intact. Bulk and tone normal, muscle strength 5/5 throughout.  Sensation to temperature and vibration intact.  Deep tendon reflexes 2+ throughout, toes downgoing.  Finger to nose testing intact.  Gait normal, Romberg negative.   Metta Clines, DO  CC: Caren Macadam, MD

## 2022-03-26 NOTE — Patient Instructions (Signed)
If you are age 69 or older, your body mass index should be between 23-30. Your Body mass index is 24.89 kg/m. If this is out of the aforementioned range listed, please consider follow up with your Primary Care Provider.  If you are age 12 or younger, your body mass index should be between 19-25. Your Body mass index is 24.89 kg/m. If this is out of the aformentioned range listed, please consider follow up with your Primary Care Provider.   ________________________________________________________  The [facility] providers would like to encourage you to use [facility] to communicate with providers for non-urgent requests or questions.  Due to long hold times on the telephone, sending your provider a message by [facility] may be a faster and more efficient way to get a response.  Please allow 48 business hours for a response.  Please remember that this is for non-urgent requests.  _______________________________________________________  Dennis Bast have been scheduled for a Barium Esophogram at [facility] (1st floor of the hospital) on 04-04-2022 at 10:30am. Please arrive 15 minutes prior to your appointment for registration. Make certain not to have anything to eat or drink 3 hours prior to your test. If you need to reschedule for any reason, please contact radiology at [phone] to do so. __________________________________________________________________ A barium swallow is an examination that concentrates on views of the esophagus. This tends to be a double contrast exam (barium and two liquids which, when combined, create a gas to distend the wall of the oesophagus) or single contrast (non-ionic iodine based). The study is usually tailored to your symptoms so a good history is essential. Attention is paid during the study to the form, structure and configuration of the esophagus, looking for functional disorders (such as aspiration, dysphagia, achalasia, motility and reflux) EXAMINATION You may be  asked to change into a gown, depending on the type of swallow being performed. A radiologist and radiographer will perform the procedure. The radiologist will advise you of the type of contrast selected for your procedure and direct you during the exam. You will be asked to stand, sit or lie in several different positions and to hold a small amount of fluid in your mouth before being asked to swallow while the imaging is performed .In some instances you may be asked to swallow barium coated marshmallows to assess the motility of a solid food bolus. The exam can be recorded as a digital or video fluoroscopy procedure. POST PROCEDURE It will take 1-2 days for the barium to pass through your system. To facilitate this, it is important, unless otherwise directed, to increase your fluids for the next 24-48hrs and to resume your normal diet.  This test typically takes about 30 minutes to perform. __________________________________________________________________________________  It was a pleasure to see you today!  Thank you for trusting me with your gastrointestinal care!

## 2022-03-26 NOTE — Progress Notes (Signed)
[facility] Progress Note  Chief Complaint: Throat discomfort and dysphagia  Subjective  History: Ja was last seen May 16 for follow-up of nonulcer dyspepsia.  EGD November 2022 normal except for retained food in the stomach which I felt was likely due to her GLP-1 agonist.  Subsequent GES normal  She is referred back at this time by primary care for throat discomfort and sensation of difficulty swallowing.  She saw a PA at a local ENT office in late May for ear itching and globus sensation, fiberoptic laryngoscopy normal.  Underwent thyroid lobectomy February of this year.  [name] says she has a persistent feeling of a knot or lump in her throat and the sensation that medicines, food and liquids sometimes "do not pass well" and points to the sternal notch. She has mucus production and cough as well. ROS: Cardiovascular:  no chest pain Respiratory: no dyspnea  The patient's Past Medical, Family and Social History were reviewed and are on file in the EMR.  Objective:  Med list reviewed  Current Outpatient Medications:    amitriptyline (ELAVIL) 25 MG tablet, TAKE ONE TABLET BY MOUTH AT BEDTIME  "OFFICE VISIT NEEDED", Disp: 90 tablet, Rfl: 3   Blood Glucose Monitoring Suppl (ONETOUCH VERIO FLEX SYSTEM) w/Device KIT, , Disp: , Rfl:    Calcium Carbonate-Vitamin D 600-200 MG-UNIT TABS, Take 1 tablet by mouth daily. , Disp: , Rfl:    clopidogrel (PLAVIX) 75 MG tablet, Take 1 tablet (75 mg total) by mouth daily., Disp: 81 tablet, Rfl: 0   DULoxetine (CYMBALTA) 30 MG capsule, 1 capsule, Disp: , Rfl:    glimepiride (AMARYL) 2 MG tablet, Take 2 mg by mouth daily., Disp: , Rfl:    Glucosamine 500 MG CAPS, Take 1 capsule by mouth daily., Disp: , Rfl:    ibandronate (BONIVA) 150 MG tablet, Take 1 tablet (150 mg total) by mouth every 30 (thirty) days. Take in the morning with a full glass of water, on an empty stomach, and do not take anything else by mouth or lie down for the next 30  min., Disp: 3 tablet, Rfl: 2   levothyroxine (SYNTHROID) 25 MCG tablet, Take 25 mcg by mouth every morning., Disp: , Rfl:    liraglutide (VICTOZA) 18 MG/3ML SOPN, Inject 1.2 mg into the skin daily., Disp: , Rfl:    lisinopril (ZESTRIL) 2.5 MG tablet, Take 2.5 mg by mouth daily., Disp: , Rfl:    metFORMIN (GLUCOPHAGE-XR) 500 MG 24 hr tablet, TAKE TWO TABLETS BY MOUTH TWICE DAILY (Patient taking differently: Take 1,000 mg by mouth in the morning and at bedtime.), Disp: 360 tablet, Rfl: 0   Multiple Vitamin (MULTIVITAMIN) capsule, Take 1 capsule by mouth daily., Disp: , Rfl:    Omega-3 Fatty Acids (OMEGA-3 FISH OIL PO), Take 1 capsule by mouth daily. , Disp: , Rfl:    pioglitazone (ACTOS) 45 MG tablet, Take 45 mg by mouth daily., Disp: , Rfl:    rosuvastatin (CRESTOR) 20 MG tablet, Take 1 tablet (20 mg total) by mouth daily., Disp: 90 tablet, Rfl: 3   TURMERIC PO, Take 1 capsule by mouth daily., Disp: , Rfl:    Vital signs in last 24 hrs: Vitals:   03/26/22 1349  BP: 110/70  Pulse: 81   Wt Readings from Last 3 Encounters:  03/26/22 135 lb (61.2 kg)  03/15/22 134 lb (60.8 kg)  02/16/22 137 lb 6 oz (62.3 kg)    Physical Exam  Mild hoarseness HEENT: sclera anicteric,  oral mucosa moist without lesions Neck: supple, no thyromegaly, JVD or lymphadenopathy Cardiac: Regular without murmur,  no peripheral edema Pulm: clear to auscultation bilaterally, normal RR and effort noted   Labs:   ___________________________________________ Radiologic studies:   ____________________________________________ Other:   _____________________________________________ Assessment & Plan  Assessment: Encounter Diagnoses  Name Primary?   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  This is not related to GERD or other upper digestive condition, nor do I think it is related to any of her  medicines.  I have ordered a barium swallow, and she should have reevaluation by ENT.  No upper endoscopy planned, as it do not expect she has developed an esophageal stricture given the reported symptoms and normal upper endoscopy last November.    [name]

## 2022-03-27 ENCOUNTER — Ambulatory Visit (INDEPENDENT_AMBULATORY_CARE_PROVIDER_SITE_OTHER): Payer: Medicare Other | Admitting: Neurology

## 2022-03-27 ENCOUNTER — Encounter: Payer: Self-pay | Admitting: Neurology

## 2022-03-27 VITALS — BP 129/71 | HR 77 | Ht 61.5 in | Wt 135.0 lb

## 2022-03-27 DIAGNOSIS — G459 Transient cerebral ischemic attack, unspecified: Secondary | ICD-10-CM | POA: Diagnosis not present

## 2022-03-27 DIAGNOSIS — I10 Essential (primary) hypertension: Secondary | ICD-10-CM

## 2022-03-27 DIAGNOSIS — E118 Type 2 diabetes mellitus with unspecified complications: Secondary | ICD-10-CM

## 2022-03-27 DIAGNOSIS — E785 Hyperlipidemia, unspecified: Secondary | ICD-10-CM

## 2022-03-27 NOTE — Patient Instructions (Addendum)
Stop clopidogrel.  Instead, take aspirin '81mg'$  daily Continue cholesterol and diabetes medication Mediterranean diet (see below) Routine exercise Follow up as needed    Mediterranean Diet A Mediterranean diet refers to food and lifestyle choices that are based on the traditions of countries located on the [employer]. It focuses on eating more fruits, vegetables, whole grains, beans, nuts, seeds, and heart-healthy fats, and eating less dairy, meat, eggs, and processed foods with added sugar, salt, and fat. This way of eating has been shown to help prevent certain conditions and improve outcomes for people who have chronic diseases, like kidney disease and heart disease. What are tips for following this plan? Reading food labels Check the serving size of packaged foods. For foods such as rice and pasta, the serving size refers to the amount of cooked product, not dry. Check the total fat in packaged foods. Avoid foods that have saturated fat or trans fats. Check the ingredient list for added sugars, such as corn syrup. Shopping  Buy a variety of foods that offer a balanced diet, including: Fresh fruits and vegetables (produce). Grains, beans, nuts, and seeds. Some of these may be available in unpackaged forms or large amounts (in bulk). Fresh seafood. Poultry and eggs. Low-fat dairy products. Buy whole ingredients instead of prepackaged foods. Buy fresh fruits and vegetables in-season from local farmers markets. Buy plain frozen fruits and vegetables. If you do not have access to quality fresh seafood, buy precooked frozen shrimp or canned fish, such as tuna, salmon, or sardines. Stock your pantry so you always have certain foods on hand, such as olive oil, canned tuna, canned tomatoes, rice, pasta, and beans. Cooking Cook foods with extra-virgin olive oil instead of using butter or other vegetable oils. Have meat as a side dish, and have vegetables or grains as your main dish. This  means having meat in small portions or adding small amounts of meat to foods like pasta or stew. Use beans or vegetables instead of meat in common dishes like chili or lasagna. Experiment with different cooking methods. Try roasting, broiling, steaming, and sauting vegetables. Add frozen vegetables to soups, stews, pasta, or rice. Add nuts or seeds for added healthy fats and plant protein at each meal. You can add these to yogurt, salads, or vegetable dishes. Marinate fish or vegetables using olive oil, lemon juice, garlic, and fresh herbs. Meal planning Plan to eat one vegetarian meal one day each week. Try to work up to two vegetarian meals, if possible. Eat seafood two or more times a week. Have healthy snacks readily available, such as: Vegetable sticks with hummus. Greek yogurt. Fruit and nut trail mix. Eat balanced meals throughout the week. This includes: Fruit: 2-3 servings a day. Vegetables: 4-5 servings a day. Low-fat dairy: 2 servings a day. Fish, poultry, or lean meat: 1 serving a day. Beans and legumes: 2 or more servings a week. Nuts and seeds: 1-2 servings a day. Whole grains: 6-8 servings a day. Extra-virgin olive oil: 3-4 servings a day. Limit red meat and sweets to only a few servings a month. Lifestyle  Cook and eat meals together with your family, when possible. Drink enough fluid to keep your urine pale yellow. Be physically active every day. This includes: Aerobic exercise like running or swimming. Leisure activities like gardening, walking, or housework. Get 7-8 hours of sleep each night. If recommended by your health care provider, drink red wine in moderation. This means 1 glass a day for nonpregnant women and 2 glasses  a day for men. A glass of wine equals 5 oz (150 mL). What foods should I eat? Fruits Apples. Apricots. Avocado. Berries. Bananas. Cherries. Dates. Figs. Grapes. Lemons. Melon. Oranges. Peaches. Plums. Pomegranate. Vegetables Artichokes.  Beets. Broccoli. Cabbage. Carrots. Eggplant. Green beans. Chard. Kale. Spinach. Onions. Leeks. Peas. Squash. Tomatoes. Peppers. Radishes. Grains Whole-grain pasta. Brown rice. Bulgur wheat. Polenta. Couscous. Whole-wheat bread. [place]. Meats and other proteins Beans. Almonds. Sunflower seeds. Pine nuts. Peanuts. [place]. Salmon. Scallops. Shrimp. [place]. Tilapia. Clams. Oysters. Eggs. Poultry without skin. Dairy Low-fat milk. Cheese. Greek yogurt. Fats and oils Extra-virgin olive oil. Avocado oil. Grapeseed oil. Beverages Water. Red wine. Herbal tea. Sweets and desserts Greek yogurt with honey. Baked apples. Poached pears. Trail mix. Seasonings and condiments Basil. Cilantro. Coriander. Cumin. Mint. Parsley. Sage. Rosemary. Tarragon. Garlic. Oregano. Thyme. Pepper. Balsamic vinegar. Tahini. Hummus. Tomato sauce. Olives. Mushrooms. The items listed above may not be a complete list of foods and beverages you can eat. Contact a dietitian for more information. What foods should I limit? This is a list of foods that should be eaten rarely or only on special occasions. Fruits Fruit canned in syrup. Vegetables Deep-fried potatoes (french fries). Grains Prepackaged pasta or rice dishes. Prepackaged cereal with added sugar. Prepackaged snacks with added sugar. Meats and other proteins Beef. Pork. Lamb. Poultry with skin. Hot dogs. Berniece Salines. Dairy Ice cream. Sour cream. Whole milk. Fats and oils Butter. Canola oil. Vegetable oil. Beef fat (tallow). Lard. Beverages Juice. Sugar-sweetened soft drinks. Beer. Liquor and spirits. Sweets and desserts Cookies. Cakes. Pies. Candy. Seasonings and condiments Mayonnaise. Pre-made sauces and marinades. The items listed above may not be a complete list of foods and beverages you should limit. Contact a dietitian for more information. Summary The Mediterranean diet includes both food and lifestyle choices. Eat a variety of fresh fruits and vegetables,  beans, nuts, seeds, and whole grains. Limit the amount of red meat and sweets that you eat. If recommended by your health care provider, drink red wine in moderation. This means 1 glass a day for nonpregnant women and 2 glasses a day for men. A glass of wine equals 5 oz (150 mL). This information is not intended to replace advice given to you by your health care provider. Make sure you discuss any questions you have with your health care provider. Document Revised: 10/09/2019 Document Reviewed: 08/06/2019 Elsevier Patient Education  [place].

## 2022-03-28 DIAGNOSIS — R5383 Other fatigue: Secondary | ICD-10-CM | POA: Diagnosis not present

## 2022-03-28 DIAGNOSIS — R059 Cough, unspecified: Secondary | ICD-10-CM | POA: Diagnosis not present

## 2022-03-28 DIAGNOSIS — J3489 Other specified disorders of nose and nasal sinuses: Secondary | ICD-10-CM | POA: Diagnosis not present

## 2022-03-28 DIAGNOSIS — Z03818 Encounter for observation for suspected exposure to other biological agents ruled out: Secondary | ICD-10-CM | POA: Diagnosis not present

## 2022-03-28 DIAGNOSIS — J029 Acute pharyngitis, unspecified: Secondary | ICD-10-CM | POA: Diagnosis not present

## 2022-04-04 ENCOUNTER — Ambulatory Visit (HOSPITAL_COMMUNITY): Admission: RE | Admit: 2022-04-04 | Payer: Medicare Other | Source: Ambulatory Visit

## 2022-04-04 NOTE — Telephone Encounter (Addendum)
[place] will [place] in [place]. Patient states she will call me back when she gets back  Deductible n/a  Annual exam 03/15/2022  Calcium    9.5         Date 02/16/2022  Upcoming dental procedures NO  Prior Authorization needed approved valid 04/04/2022-04/05/2023 Clinicals Josem Kaufmann  [id number] Fax# [phone] PRIOR AUTH SCANNED IN SYSTEM  Pt estimated Cost $304       COVERAGE DETAILS: For the primary MD Purchase option, [employer] will be subject to a 20% coinsurance up to a $3600 out of pocket max ($739.16 met). Once met, coverage increases to 100%. Administration is subject to $20 copay, which do contribute to OOP max. No deductible applies. Referral is not required. We have provided in network benefits only. AUTHORIZATION REQUIRED: Yes PA PROCESS DETAILS: PA is required and is currently not on file. Effective 09/17/2021 if the patient is new to [facility], Prior authorization and Step Therapy are required & not on file. Please go to [email or web address] or call [phone] to initiate the prior authorization. For exception to the policy please visit [email or web address] and review Policy Number [id number] NETWORK STATUS: In [place]

## 2022-06-20 DIAGNOSIS — R09A2 Foreign body sensation, throat: Secondary | ICD-10-CM | POA: Diagnosis not present

## 2022-06-20 DIAGNOSIS — Z8673 Personal history of transient ischemic attack (TIA), and cerebral infarction without residual deficits: Secondary | ICD-10-CM | POA: Diagnosis not present

## 2022-06-20 DIAGNOSIS — E7800 Pure hypercholesterolemia, unspecified: Secondary | ICD-10-CM | POA: Diagnosis not present

## 2022-06-20 DIAGNOSIS — E890 Postprocedural hypothyroidism: Secondary | ICD-10-CM | POA: Diagnosis not present

## 2022-06-20 DIAGNOSIS — R5383 Other fatigue: Secondary | ICD-10-CM | POA: Diagnosis not present

## 2022-06-20 DIAGNOSIS — Z23 Encounter for immunization: Secondary | ICD-10-CM | POA: Diagnosis not present

## 2022-06-20 DIAGNOSIS — E118 Type 2 diabetes mellitus with unspecified complications: Secondary | ICD-10-CM | POA: Diagnosis not present

## 2022-07-16 DIAGNOSIS — R1310 Dysphagia, unspecified: Secondary | ICD-10-CM | POA: Diagnosis not present

## 2022-07-23 ENCOUNTER — Ambulatory Visit (HOSPITAL_COMMUNITY)
Admission: RE | Admit: 2022-07-23 | Discharge: 2022-07-23 | Disposition: A | Discharge status: Home / Self Care | Payer: Medicare Other | Source: Ambulatory Visit | Attending: Gastroenterology | Admitting: Gastroenterology

## 2022-07-23 DIAGNOSIS — R09A2 Foreign body sensation, throat: Secondary | ICD-10-CM | POA: Insufficient documentation

## 2022-07-23 DIAGNOSIS — R1314 Dysphagia, pharyngoesophageal phase: Secondary | ICD-10-CM | POA: Insufficient documentation

## 2022-07-23 DIAGNOSIS — K2289 Other specified disease of esophagus: Secondary | ICD-10-CM | POA: Diagnosis not present

## 2022-07-25 DIAGNOSIS — R42 Dizziness and giddiness: Secondary | ICD-10-CM | POA: Diagnosis not present

## 2022-07-31 ENCOUNTER — Other Ambulatory Visit: Payer: Self-pay | Admitting: Obstetrics & Gynecology

## 2022-07-31 DIAGNOSIS — Z1231 Encounter for screening mammogram for malignant neoplasm of breast: Secondary | ICD-10-CM

## 2022-08-03 ENCOUNTER — Encounter: Payer: Self-pay | Admitting: *Deleted

## 2022-08-03 ENCOUNTER — Telehealth: Payer: Self-pay | Admitting: *Deleted

## 2022-08-03 NOTE — Telephone Encounter (Signed)
Left message for pt to return my call. My chart message sent as well

## 2022-08-03 NOTE — Telephone Encounter (Signed)
error 

## 2022-08-13 ENCOUNTER — Ambulatory Visit: Payer: Medicare Other

## 2022-08-13 DIAGNOSIS — J069 Acute upper respiratory infection, unspecified: Secondary | ICD-10-CM | POA: Diagnosis not present

## 2022-08-16 DIAGNOSIS — J329 Chronic sinusitis, unspecified: Secondary | ICD-10-CM | POA: Diagnosis not present

## 2022-08-17 ENCOUNTER — Encounter: Payer: Self-pay | Admitting: Physical Therapy

## 2022-08-17 ENCOUNTER — Ambulatory Visit: Payer: Medicare Other | Attending: Family Medicine | Admitting: Physical Therapy

## 2022-08-17 VITALS — BP 100/64 | HR 82

## 2022-08-17 DIAGNOSIS — R2681 Unsteadiness on feet: Secondary | ICD-10-CM | POA: Diagnosis not present

## 2022-08-17 DIAGNOSIS — R42 Dizziness and giddiness: Secondary | ICD-10-CM | POA: Insufficient documentation

## 2022-08-17 DIAGNOSIS — R293 Abnormal posture: Secondary | ICD-10-CM | POA: Insufficient documentation

## 2022-08-17 NOTE — Therapy (Signed)
OUTPATIENT PHYSICAL THERAPY VESTIBULAR EVALUATION     Patient Name: Destiny Moody MRN: [id number] DOB:Oct 09, 1950, 71 y.o., female Today's Date: 08/17/2022  END OF SESSION:  PT End of Session - 08/17/22 1023     Visit Number 1    Number of Visits 5    Date for PT Re-Evaluation 09/16/22    PT Start Time 0935    PT Stop Time 8295    PT Time Calculation (min) 40 min    Activity Tolerance Patient tolerated treatment well    Behavior During Therapy [facility] for tasks assessed/performed             Past Medical History:  Diagnosis Date   Allergy    Arthritis    Breast cancer ([place]) 08/24/14   right, upper inner   Cancer ([place])    Chronic kidney disease    Depression    no meds   HSV (herpes simplex virus) anogenital infection 2018   Hypercholesterolemia    Hypertension    Hypothyroidism    Multinodular thyroid    NIDDM (non-insulin dependent diabetes mellitus)    Osteoporosis 2020   T score -2.5 stable from prior DEXA   Radiation 01/31/15-03/01/15   right  breast   Rheumatoid arthritis(714.0)    lower back   Wears contact lenses    Past Surgical History:  Procedure Laterality Date   BREAST LUMPECTOMY Right 2016   BREAST SURGERY     Lumpectomy   CESAREAN SECTION  6213,0865   x 2   CHOLECYSTECTOMY  1993   COLONOSCOPY  last 06/15/2016   greater than 12 years but not sure where colonoscopy was performed   INCISION AND DRAINAGE ABSCESS Right 11/24/2014   Procedure: INCISION AND DRAINAGE RIGHT AXILLA  ABSCESS;  Surgeon: Fanny Skates, MD;  Location: [facility];  Service: General;  Laterality: Right;   RADIOACTIVE SEED GUIDED PARTIAL MASTECTOMY WITH AXILLARY SENTINEL LYMPH NODE BIOPSY Right 10/12/2014   Procedure: RIGHT  PARTIAL MASTECTOMY WITH RADIOACTIVE SEED LOCALIZATION  RIGHT  AXILLARY SENTINEL  NODE BIOPSY;  Surgeon: Fanny Skates, MD;  Location: [facility];  Service: General;  Laterality: Right;   repair of lateral epicondylitis of right humerus  01/15/2021    THYROIDECTOMY, PARTIAL  2023   TUBAL LIGATION  1996   re annastomosis   [place] EXTRACTION     Patient Active Problem List   Diagnosis Date Noted   Adjustment disorder 08/04/2021   Claudication ([place]) 08/04/2021   Constipation 08/04/2021   Diabetic retinopathy associated with type 2 diabetes mellitus ([place]) 08/04/2021   Disorder of breast 08/04/2021   Facial paresthesia 08/04/2021   Hypertensive retinopathy 08/04/2021   Migraine 08/04/2021   Mixed anxiety and depressive disorder 08/04/2021   Major depression single episode, in partial remission ([place]) 08/04/2021   Moderate major depression, single episode ([place]) 08/04/2021   Pure hypercholesterolemia 08/04/2021   Bilateral sensorineural hearing loss 08/09/2020   Personal history of malignant neoplasm of breast 11/18/2019   Degeneration of lumbar intervertebral disc 11/18/2019   Scoliosis deformity of spine 11/18/2019   Sepsis due to urinary tract infection ([place]) 08/26/2019   Acute pyelonephritis 08/26/2019   Sepsis secondary to UTI ([place]) 05/08/2019   Hyperlipidemia associated with type 2 diabetes mellitus ([place])    Hypertension    Thyroid nodule    Age-related osteoporosis without current pathological fracture 12/27/2016   Genital herpes in women 12/20/2016   Wrist pain, chronic, right 08/01/2016   Genetic testing 04/27/2015   Family history of  colon cancer    Family history of ovarian cancer    Type 2 diabetes mellitus with unspecified complications ([place]) 09/98/3382   Malignant neoplasm of upper-inner quadrant of right breast in female, estrogen receptor positive ([place]) 08/26/2014   Osteoporosis 08/10/2014   Vaginal atrophy 04/27/2014   Type 2 diabetes mellitus ([place]) 04/17/2011   GERD 04/07/2008   FATTY LIVER DISEASE 04/07/2008    PCP: Caren Macadam, MD  REFERRING PROVIDER: Olen Cordial, PA  REFERRING DIAG: R42 (ICD-10-CM) - Dizziness and giddiness  THERAPY DIAG:  Dizziness and giddiness  Unsteadiness on  feet  ONSET DATE: 07/25/2022  Rationale for Evaluation and Treatment: Rehabilitation  SUBJECTIVE:   SUBJECTIVE STATEMENT: Pt reports that she has had dizziness for a while, about 10 years, but has not been treated for it before. In October, had a molar removed and reports that she had episodes of dizziness. Some days she feels dizzy, other days she doesn't. Certain movements in bed will make her dizzy or when she goes to stand up. 2 weeks ago was her last episode of dizziness. Was prescribed Meclizine for her dizziness, but is not taking it. Reports dizziness will last about an hour.    PT having trouble getting straight forward answers from pt regarding her vertigo.    Pt accompanied by: self  PERTINENT HISTORY: PMH: Type II Diabetes, Migraine headaches, hx of breast cancer, HTN, Osteoporosis, Hypothyroidism, hx of TIA  PAIN:  Are you having pain? No  Vitals:   08/17/22 0948  BP: 100/64  Pulse: 82     PRECAUTIONS: None  WEIGHT BEARING RESTRICTIONS: No  FALLS: Has patient fallen in last 6 months? No  LIVING ENVIRONMENT: Lives with: lives with their spouse Lives in: House/apartment Stairs: Yes: Internal: split level steps; on right going up Has following equipment at home: None  PLOF: Independent  PATIENT GOALS: Wants to help the vertigo   OBJECTIVE:   DIAGNOSTIC FINDINGS:   On 08/29/2021: IMPRESSION: MRI:   1. No evidence of acute intracranial abnormality. 2. Moderate patchy T2/FLAIR hyperintensities in the white matter which are nonspecific but most likely related to chronic microvascular ischemic disease given the patient's risk factors. Chronic demyelination is a differential consideration.   MRA:   No large vessel occlusion or evidence of proximal hemodynamically significant stenosis.   RANSFERS: Assistive device utilized: None  Sit to stand: Complete Independence Stand to sit: Complete Independence   GAIT: Gait pattern: WFL and step through  pattern Distance walked: Clinic distances  Assistive device utilized: None Level of assistance: SBA  PATIENT SURVEYS:  Academic librarian did not capture.   VESTIBULAR ASSESSMENT:  GENERAL OBSERVATION: Ambulates in with no AD.    SYMPTOM BEHAVIOR:  Subjective history: See above.   Non-Vestibular symptoms: neck pain, headaches, tinnitus, and nausea/vomiting  Type of dizziness: Imbalance (Disequilibrium) and Spinning/Vertigo  Frequency: about once a month.   Duration: 1 hr or sometimes maybe less.   Aggravating factors: Induced by position change: supine to sit and pt unsure of anything else, does not pay attention.   Relieving factors: lying supine  Progression of symptoms: unchanged  OCULOMOTOR EXAM:  Ocular Alignment: normal  Ocular ROM: No Limitations  Spontaneous Nystagmus: absent  Gaze-Induced Nystagmus: absent  Smooth Pursuits: intact and pt needing cues to just move her eyes vs. Her head   Saccades: intact and pt reporting dizziness   VESTIBULAR - OCULAR REFLEX:   Slow VOR: Normal - mild dizziness   VOR Cancellation: Normal  Head-Impulse Test: HIT  Right: positive HIT Left: negative  Pt would close her eyes throughout, needing cues to keep eyes open.   Dynamic Visual Acuity: Did not assess.    POSITIONAL TESTING: Right Dix-Hallpike: no nystagmus Left Dix-Hallpike: no nystagmus Right Roll Test: no nystagmus Left Roll Test: no nystagmus Right Sidelying: no nystagmus and pt reporting mild dizziness that subsided quickly  Left Sidelying: no nystagmus  MOTION SENSITIVITY:  Motion Sensitivity Quotient Intensity: 0 = none, 1 = Lightheaded, 2 = Mild, 3 = Moderate, 4 = Severe, 5 = Vomiting  Intensity  1. Sitting to supine 2  2. Supine to L side 0  3. Supine to R side 0  4. Supine to sitting 0  5. L Hallpike-Dix 0  6. Up from L  0  7. R Hallpike-Dix 0  8. Up from R  0  9. Sitting, head tipped to L knee 0  10. Head up from L knee 0  11. Sitting, head tipped to R knee 0   12. Head up from R knee 0  13. Sitting head turns x5 0  14.Sitting head nods x5 2  15. In stance, 180 turn to L  0  16. In stance, 180 turn to R 0       M-CTSIB  Condition 1: Firm Surface, EO 30 Sec, Normal Sway  Condition 2: Firm Surface, EC 30 Sec, Normal Sway  Condition 3: Foam Surface, EO 30 Sec, Mild Sway  Condition 4: Foam Surface, EC 30 Sec, Mild/Mod Sway       VESTIBULAR TREATMENT:                                                                                                   N/A during eval.   PATIENT EDUCATION: Education details: Clinical findings, POC.  Person educated: Patient Education method: Explanation Education comprehension: verbalized understanding  HOME EXERCISE PROGRAM:  GOALS: Goals reviewed with patient? Yes  SHORT TERM GOALS: ALL STGS = LTGS   LONG TERM GOALS: Target date: 09/14/2022  Pt will be independent with final HEP for vestibular deficits in order to build upon functional gains made in therapy. Baseline:  Goal status: INITIAL  2.  Pt will undergo further testing with with SOT with LTG written.  Baseline:  Goal status: INITIAL  3.  Pt will demonstrate no dizziness with bed mobility (R sidelying, supine to sit, and sidelying > sit) in order to demo decr motion sensitivity.  Baseline:  Goal status: INITIAL  4.  DVA to be assessed with goal written as appropriate.  Baseline:  Goal status: INITIAL  ASSESSMENT:  CLINICAL IMPRESSION: Patient is a 71 year old female referred to Neuro OPPT for Dizziness/Vertigo. Pt reporting a 10 year hx of vertigo, but reports it has gotten worse recently. Pt mainly notes dizziness when getting up from lying down. The following deficits were present during the exam: positive HIT to the R indicating impaired VOR, dizziness with slow VOR and saccades, incr postural sway with condition 4 on mCTSIB looking at vestibular input for balance, motion sensitivities with sidelying to sit and head nods. Pt  negative for positional testing. Will perform further balance testing such as the SOT at next session. Pt would benefit from skilled PT to address these impairments and functional limitations to maximize functional mobility independence, improve activity tolerance and decr dizziness.    OBJECTIVE IMPAIRMENTS: decreased activity tolerance, decreased balance, and dizziness.   ACTIVITY LIMITATIONS: bed mobility and locomotion level  PARTICIPATION LIMITATIONS: driving and community activity  PERSONAL FACTORS: Age, Behavior pattern, Past/current experiences, Time since onset of injury/illness/exacerbation, and 3+ comorbidities: Type II Diabetes, Migraine headaches, hx of breast cancer, HTN, Osteoporosis, Hypothyroidism, hx of TIA  are also affecting patient's functional outcome.   REHAB POTENTIAL: Good  CLINICAL DECISION MAKING: Stable/uncomplicated  EVALUATION COMPLEXITY: Low   PLAN:  PT FREQUENCY: 1x/week  PT DURATION: 4 weeks  PLANNED INTERVENTIONS: Therapeutic exercises, Therapeutic activity, Neuromuscular re-education, Balance training, Gait training, Patient/Family education, Self Care, Vestibular training, Canalith repositioning, and Re-evaluation  PLAN FOR NEXT SESSION: Perform SOT, assess DVA. Initial HEP - Brandt Daroff, VOR, EC balance.    Arliss Journey, PT, DPT  08/17/2022, 10:27 AM

## 2022-08-20 ENCOUNTER — Ambulatory Visit: Payer: Medicare Other | Admitting: Physical Therapy

## 2022-08-21 DIAGNOSIS — E039 Hypothyroidism, unspecified: Secondary | ICD-10-CM | POA: Diagnosis not present

## 2022-08-22 ENCOUNTER — Other Ambulatory Visit: Payer: Self-pay | Admitting: Family Medicine

## 2022-08-22 DIAGNOSIS — E041 Nontoxic single thyroid nodule: Secondary | ICD-10-CM

## 2022-08-24 ENCOUNTER — Ambulatory Visit: Payer: Medicare Other

## 2022-08-24 DIAGNOSIS — R2681 Unsteadiness on feet: Secondary | ICD-10-CM | POA: Diagnosis not present

## 2022-08-24 DIAGNOSIS — R293 Abnormal posture: Secondary | ICD-10-CM

## 2022-08-24 DIAGNOSIS — R42 Dizziness and giddiness: Secondary | ICD-10-CM | POA: Diagnosis not present

## 2022-08-24 NOTE — Therapy (Signed)
OUTPATIENT PHYSICAL THERAPY VESTIBULAR TREATMENT     Patient Name: Destiny Moody MRN: [id number] DOB:Nov 10, 1950, 71 y.o., female Today's Date: 08/24/2022  END OF SESSION:  PT End of Session - 08/24/22 0934     Visit Number 2    Number of Visits 5    Date for PT Re-Evaluation 09/16/22    PT Start Time 0933    PT Stop Time 2119    PT Time Calculation (min) 39 min    Activity Tolerance Patient tolerated treatment well    Behavior During Therapy [facility] for tasks assessed/performed             Past Medical History:  Diagnosis Date   Allergy    Arthritis    Breast cancer ([place]) 08/24/14   right, upper inner   Cancer ([place])    Chronic kidney disease    Depression    no meds   HSV (herpes simplex virus) anogenital infection 2018   Hypercholesterolemia    Hypertension    Hypothyroidism    Multinodular thyroid    NIDDM (non-insulin dependent diabetes mellitus)    Osteoporosis 2020   T score -2.5 stable from prior DEXA   Radiation 01/31/15-03/01/15   right  breast   Rheumatoid arthritis(714.0)    lower back   Wears contact lenses    Past Surgical History:  Procedure Laterality Date   BREAST LUMPECTOMY Right 2016   BREAST SURGERY     Lumpectomy   CESAREAN SECTION  4174,0814   x 2   CHOLECYSTECTOMY  1993   COLONOSCOPY  last 06/15/2016   greater than 12 years but not sure where colonoscopy was performed   INCISION AND DRAINAGE ABSCESS Right 11/24/2014   Procedure: INCISION AND DRAINAGE RIGHT AXILLA  ABSCESS;  Surgeon: Fanny Skates, MD;  Location: [place];  Service: General;  Laterality: Right;   RADIOACTIVE SEED GUIDED PARTIAL MASTECTOMY WITH AXILLARY SENTINEL LYMPH NODE BIOPSY Right 10/12/2014   Procedure: RIGHT  PARTIAL MASTECTOMY WITH RADIOACTIVE SEED LOCALIZATION  RIGHT  AXILLARY SENTINEL  NODE BIOPSY;  Surgeon: Fanny Skates, MD;  Location: [place];  Service: General;  Laterality: Right;   repair of lateral epicondylitis of right humerus  01/15/2021    THYROIDECTOMY, PARTIAL  2023   TUBAL LIGATION  1996   re annastomosis   [place] EXTRACTION     Patient Active Problem List   Diagnosis Date Noted   Adjustment disorder 08/04/2021   Claudication ([place]) 08/04/2021   Constipation 08/04/2021   Diabetic retinopathy associated with type 2 diabetes mellitus ([place]) 08/04/2021   Disorder of breast 08/04/2021   Facial paresthesia 08/04/2021   Hypertensive retinopathy 08/04/2021   Migraine 08/04/2021   Mixed anxiety and depressive disorder 08/04/2021   Major depression single episode, in partial remission ([place]) 08/04/2021   Moderate major depression, single episode ([place]) 08/04/2021   Pure hypercholesterolemia 08/04/2021   Bilateral sensorineural hearing loss 08/09/2020   Personal history of malignant neoplasm of breast 11/18/2019   Degeneration of lumbar intervertebral disc 11/18/2019   Scoliosis deformity of spine 11/18/2019   Sepsis due to urinary tract infection ([place]) 08/26/2019   Acute pyelonephritis 08/26/2019   Sepsis secondary to UTI ([employer]) 05/08/2019   Hyperlipidemia associated with type 2 diabetes mellitus ([place])    Hypertension    Thyroid nodule    Age-related osteoporosis without current pathological fracture 12/27/2016   Genital herpes in women 12/20/2016   Wrist pain, chronic, right 08/01/2016   Genetic testing 04/27/2015   Family history of  colon cancer    Family history of ovarian cancer    Type 2 diabetes mellitus with unspecified complications ([place]) 89/38/1017   Malignant neoplasm of upper-inner quadrant of right breast in female, estrogen receptor positive ([place]) 08/26/2014   Osteoporosis 08/10/2014   Vaginal atrophy 04/27/2014   Type 2 diabetes mellitus ([place]) 04/17/2011   GERD 04/07/2008   FATTY LIVER DISEASE 04/07/2008    PCP: Caren Macadam, MD  REFERRING PROVIDER: Olen Cordial, PA  REFERRING DIAG: R42 (ICD-10-CM) - Dizziness and giddiness  THERAPY DIAG:  Dizziness and giddiness  Unsteadiness on  feet  Abnormal posture  ONSET DATE: 07/25/2022  Rationale for Evaluation and Treatment: Rehabilitation  SUBJECTIVE:   SUBJECTIVE STATEMENT: Patient reports doing well- no dizziness in the past 2 weeks- no dizziness today. Patient still with vague answers to direct questions.   Pt accompanied by: self  PERTINENT HISTORY: PMH: Type II Diabetes, Migraine headaches, hx of breast cancer, HTN, Osteoporosis, Hypothyroidism, hx of TIA  PAIN:  Are you having pain? No  There were no vitals filed for this visit.  PRECAUTIONS: None   PATIENT GOALS: Wants to help the vertigo   OBJECTIVE:   DIAGNOSTIC FINDINGS:   On 08/29/2021: IMPRESSION: MRI:   1. No evidence of acute intracranial abnormality. 2. Moderate patchy T2/FLAIR hyperintensities in the white matter which are nonspecific but most likely related to chronic microvascular ischemic disease given the patient's risk factors. Chronic demyelination is a differential consideration.   MRA:   No large vessel occlusion or evidence of proximal hemodynamically significant stenosis.   PATIENT SURVEYS:  FOTO 57   VESTIBULAR TREATMENT:                                                                                                   -FOTO: 69 -DVA:   -static: line 7   -dynamic: line 6  -SOT   -condition 1: pass 2/3   -Condition 2: pass 3/3   -Condition 3: pass 3/3   -condition 4: pass 0/3   -condition 5: pass 0/3   -condition 6: pass 0/3   -composite: 47 (below age-matched norm)  -vision and vest fail; preference pass   PATIENT EDUCATION: Education details: Clinical findings, POC.  Person educated: Patient Education method: Explanation Education comprehension: verbalized understanding  HOME EXERCISE PROGRAM:  GOALS: Goals reviewed with patient? Yes  SHORT TERM GOALS: ALL STGS = LTGS   LONG TERM GOALS: Target date: 09/14/2022  Pt will be independent with final HEP for vestibular deficits in order to build  upon functional gains made in therapy. Baseline:  Goal status: INITIAL  2.  Patient will improve SOT composite score to >/= 52 to demonstrate improved vestibular function Baseline: 47 Goal status: INITIAL  3.  Pt will demonstrate no dizziness with bed mobility (R sidelying, supine to sit, and sidelying > sit) in order to demo decr motion sensitivity.  Baseline:  Goal status: INITIAL  4.  DVA to be assessed with goal written as appropriate.  Baseline: goal not need as WNL Goal status: INITIAL  ASSESSMENT:  CLINICAL IMPRESSION: Patient seen for  skilled PT session with emphasis on completing vestibular assessment. Patient initially stating that she has not had any dizziness for the past 2 weeks, but throughout session patient commenting on minor activities (such as looking down at her coat on the chair) causing her dizziness while here in the clinic. Patient with vague answers to PT questioning to elaborate on current level of dizziness. DVA WNL. Patient with noted difficulty on SOT with EC and compliant surfaces consistent with a vestibular hypofunction. Unable to prescribe HEP at this time due to time constraints. Continue POC.    OBJECTIVE IMPAIRMENTS: decreased activity tolerance, decreased balance, and dizziness.   ACTIVITY LIMITATIONS: bed mobility and locomotion level  PARTICIPATION LIMITATIONS: driving and community activity  PERSONAL FACTORS: Age, Behavior pattern, Past/current experiences, Time since onset of injury/illness/exacerbation, and 3+ comorbidities: Type II Diabetes, Migraine headaches, hx of breast cancer, HTN, Osteoporosis, Hypothyroidism, hx of TIA  are also affecting patient's functional outcome.   REHAB POTENTIAL: Good  CLINICAL DECISION MAKING: Stable/uncomplicated  EVALUATION COMPLEXITY: Low   PLAN:  PT FREQUENCY: 1x/week  PT DURATION: 4 weeks  PLANNED INTERVENTIONS: Therapeutic exercises, Therapeutic activity, Neuromuscular re-education, Balance  training, Gait training, Patient/Family education, Self Care, Vestibular training, [place] repositioning, and Re-evaluation  PLAN FOR NEXT SESSION: Initial HEP - Brandt Daroff, VOR, EC balance.    Debbora Dus, PT, DPT  Debbora Dus, PT, DPT, CBIS  08/24/2022, 10:15 AM

## 2022-08-27 ENCOUNTER — Ambulatory Visit: Payer: Medicare Other

## 2022-08-27 DIAGNOSIS — R42 Dizziness and giddiness: Secondary | ICD-10-CM

## 2022-08-27 DIAGNOSIS — R293 Abnormal posture: Secondary | ICD-10-CM | POA: Diagnosis not present

## 2022-08-27 DIAGNOSIS — R2681 Unsteadiness on feet: Secondary | ICD-10-CM | POA: Diagnosis not present

## 2022-08-27 DIAGNOSIS — E7800 Pure hypercholesterolemia, unspecified: Secondary | ICD-10-CM | POA: Diagnosis not present

## 2022-08-27 DIAGNOSIS — K219 Gastro-esophageal reflux disease without esophagitis: Secondary | ICD-10-CM | POA: Diagnosis not present

## 2022-08-27 DIAGNOSIS — E1165 Type 2 diabetes mellitus with hyperglycemia: Secondary | ICD-10-CM | POA: Diagnosis not present

## 2022-08-27 DIAGNOSIS — M810 Age-related osteoporosis without current pathological fracture: Secondary | ICD-10-CM | POA: Diagnosis not present

## 2022-08-27 DIAGNOSIS — I10 Essential (primary) hypertension: Secondary | ICD-10-CM | POA: Diagnosis not present

## 2022-08-27 DIAGNOSIS — E890 Postprocedural hypothyroidism: Secondary | ICD-10-CM | POA: Diagnosis not present

## 2022-08-27 NOTE — Therapy (Signed)
OUTPATIENT PHYSICAL THERAPY VESTIBULAR TREATMENT     Patient Name: Destiny Moody MRN: [id number] DOB:05-03-1951, 71 y.o., female Today's Date: 08/27/2022  END OF SESSION:  PT End of Session - 08/27/22 1022     Visit Number 3    Number of Visits 5    Date for PT Re-Evaluation 09/16/22    PT Start Time 1021   patient late   PT Stop Time 1059    PT Time Calculation (min) 38 min    Equipment Utilized During Treatment Gait belt    Activity Tolerance Patient tolerated treatment well    Behavior During Therapy WFL for tasks assessed/performed             Past Medical History:  Diagnosis Date   Allergy    Arthritis    Breast cancer ([place]) 08/24/14   right, upper inner   Cancer ([place])    Chronic kidney disease    Depression    no meds   HSV (herpes simplex virus) anogenital infection 2018   Hypercholesterolemia    Hypertension    Hypothyroidism    Multinodular thyroid    NIDDM (non-insulin dependent diabetes mellitus)    Osteoporosis 2020   T score -2.5 stable from prior DEXA   Radiation 01/31/15-03/01/15   right  breast   Rheumatoid arthritis(714.0)    lower back   Wears contact lenses    Past Surgical History:  Procedure Laterality Date   BREAST LUMPECTOMY Right 2016   BREAST SURGERY     Lumpectomy   CESAREAN SECTION  6213,0865   x 2   CHOLECYSTECTOMY  1993   COLONOSCOPY  last 06/15/2016   greater than 12 years but not sure where colonoscopy was performed   INCISION AND DRAINAGE ABSCESS Right 11/24/2014   Procedure: INCISION AND DRAINAGE RIGHT AXILLA  ABSCESS;  Surgeon: Fanny Skates, MD;  Location: [place];  Service: General;  Laterality: Right;   RADIOACTIVE SEED GUIDED PARTIAL MASTECTOMY WITH AXILLARY SENTINEL LYMPH NODE BIOPSY Right 10/12/2014   Procedure: RIGHT  PARTIAL MASTECTOMY WITH RADIOACTIVE SEED LOCALIZATION  RIGHT  AXILLARY SENTINEL  NODE BIOPSY;  Surgeon: Fanny Skates, MD;  Location: [place];  Service: General;  Laterality: Right;    repair of lateral epicondylitis of right humerus  01/15/2021   THYROIDECTOMY, PARTIAL  2023   TUBAL LIGATION  1996   re annastomosis   [place] EXTRACTION     Patient Active Problem List   Diagnosis Date Noted   Adjustment disorder 08/04/2021   Claudication ([place]) 08/04/2021   Constipation 08/04/2021   Diabetic retinopathy associated with type 2 diabetes mellitus ([place]) 08/04/2021   Disorder of breast 08/04/2021   Facial paresthesia 08/04/2021   Hypertensive retinopathy 08/04/2021   Migraine 08/04/2021   Mixed anxiety and depressive disorder 08/04/2021   Major depression single episode, in partial remission ([place]) 08/04/2021   Moderate major depression, single episode ([place]) 08/04/2021   Pure hypercholesterolemia 08/04/2021   Bilateral sensorineural hearing loss 08/09/2020   Personal history of malignant neoplasm of breast 11/18/2019   Degeneration of lumbar intervertebral disc 11/18/2019   Scoliosis deformity of spine 11/18/2019   Sepsis due to urinary tract infection ([place]) 08/26/2019   Acute pyelonephritis 08/26/2019   Sepsis secondary to UTI ([place]) 05/08/2019   Hyperlipidemia associated with type 2 diabetes mellitus ([place])    Hypertension    Thyroid nodule    Age-related osteoporosis without current pathological fracture 12/27/2016   Genital herpes in women 12/20/2016   Wrist pain, chronic,  right 08/01/2016   Genetic testing 04/27/2015   Family history of colon cancer    Family history of ovarian cancer    Type 2 diabetes mellitus with unspecified complications ([place]) 16/06/9603   Malignant neoplasm of upper-inner quadrant of right breast in female, estrogen receptor positive ([place]) 08/26/2014   Osteoporosis 08/10/2014   Vaginal atrophy 04/27/2014   Type 2 diabetes mellitus ([place]) 04/17/2011   GERD 04/07/2008   FATTY LIVER DISEASE 04/07/2008    PCP: Caren Macadam, MD  REFERRING PROVIDER: Olen Cordial, PA  REFERRING DIAG: R42 (ICD-10-CM) - Dizziness and  giddiness  THERAPY DIAG:  Dizziness and giddiness  Unsteadiness on feet  Abnormal posture  ONSET DATE: 07/25/2022  Rationale for Evaluation and Treatment: Rehabilitation  SUBJECTIVE:   SUBJECTIVE STATEMENT: Patient reports doing well- did have a near fall tripping over something waiting for food at the mall. Also had dizziness on Saturday shopping with her family. Unable to state whether she was moving her head/eyes and whether that contributed to dizziness. Says it lasted ~10 mins, once she sat it passed.   Pt accompanied by: self  PERTINENT HISTORY: PMH: Type II Diabetes, Migraine headaches, hx of breast cancer, HTN, Osteoporosis, Hypothyroidism, hx of TIA  PAIN:  Are you having pain? No  There were no vitals filed for this visit.  PRECAUTIONS: None   PATIENT GOALS: Wants to help the vertigo   OBJECTIVE:   DIAGNOSTIC FINDINGS:   On 08/29/2021: IMPRESSION: MRI:   1. No evidence of acute intracranial abnormality. 2. Moderate patchy T2/FLAIR hyperintensities in the white matter which are nonspecific but most likely related to chronic microvascular ischemic disease given the patient's risk factors. Chronic demyelination is a differential consideration.   MRA:   No large vessel occlusion or evidence of proximal hemodynamically significant stenosis.   PATIENT SURVEYS:  FOTO 57   VESTIBULAR TREATMENT:                                                                                                    [facility] PT Assessment - 08/27/22 0001       Functional Gait  Assessment   Gait assessed  Yes    Gait Level Surface Walks 20 ft in less than 5.5 sec, no assistive devices, good speed, no evidence for imbalance, normal gait pattern, deviates no more than 6 in outside of the 12 in walkway width.    Change in Gait Speed Able to smoothly change walking speed without loss of balance or gait deviation. Deviate no more than 6 in outside of the 12 in walkway width.    Gait  with Horizontal Head Turns Performs head turns smoothly with slight change in gait velocity (eg, minor disruption to smooth gait path), deviates 6-10 in outside 12 in walkway width, or uses an assistive device.    Gait with Vertical Head Turns Performs task with slight change in gait velocity (eg, minor disruption to smooth gait path), deviates 6 - 10 in outside 12 in walkway width or uses assistive device    Gait and Pivot Turn Pivot turns safely in  greater than 3 sec and stops with no loss of balance, or pivot turns safely within 3 sec and stops with mild imbalance, requires small steps to catch balance.    Step Over Obstacle Is able to step over one shoe box (4.5 in total height) but must slow down and adjust steps to clear box safely. May require verbal cueing.    Gait with Narrow Base of Support Ambulates 7-9 steps.    Gait with Eyes Closed Walks 20 ft, uses assistive device, slower speed, mild gait deviations, deviates 6-10 in outside 12 in walkway width. Ambulates 20 ft in less than 9 sec but greater than 7 sec.    Ambulating Backwards Walks 20 ft, uses assistive device, slower speed, mild gait deviations, deviates 6-10 in outside 12 in walkway width.    Steps Alternating feet, must use rail.    Total Score 21            -HEP (see below)   -patient with significant difficulty maintaining gaize on stable target with horizontal VOR despite Max verbal cuing  PATIENT EDUCATION: Education details: HEP Person educated: Patient Education method: Explanation Education comprehension: verbalized understanding  HOME EXERCISE PROGRAM: Sit to Side-Lying    Sit on edge of bed. 1. Turn head 45 to right. 2. Maintain head position and lie down slowly on left side. Hold until symptoms subside. 3. Sit up slowly. Hold until symptoms subside. 4. Turn head 45 to left. 5. Maintain head position and lie down slowly on right side. Hold until symptoms subside. 6. Sit up slowly. Repeat sequence __4__  times per session. Do ___2_ sessions per day.  Gaze Stabilization: Sitting    Eyes on target on wall 5 feet away, tilt head down 15-30 and move head side to side for _45___ seconds. Repeat while moving head up and down for __45__ seconds. Do __3__ sessions per day.  Access Code: [email or web address] URL: [email or web address] Date: 08/27/2022 Prepared by: Estevan Ryder  Exercises - Standing Balance in Corner with Eyes Closed  - 1 x daily - 7 x weekly - 3 sets - 30 hold - Corner Balance Feet Together With Eyes Open  - 1 x daily - 7 x weekly - 3 sets - 30 hold - Corner Balance Feet Together With Eyes Closed  - 1 x daily - 7 x weekly - 3 sets - 30 hold - Corner Balance Feet Together: Eyes Open With Head Turns  - 1 x daily - 7 x weekly - 3 sets - 10 reps - Semi-Tandem Corner Balance With Eyes Open  - 1 x daily - 7 x weekly - 3 sets - 30 hold - Semi-Tandem Corner Balance With Eyes Closed  - 1 x daily - 7 x weekly - 3 sets - 30 hold GOALS: Goals reviewed with patient? Yes  SHORT TERM GOALS: ALL STGS = LTGS   LONG TERM GOALS: Target date: 09/14/2022  Pt will be independent with final HEP for vestibular deficits in order to build upon functional gains made in therapy. Baseline:  Goal status: INITIAL  2.  Patient will improve SOT composite score to >/= 52 to demonstrate improved vestibular function Baseline: 47 Goal status: INITIAL  3.  Pt will demonstrate no dizziness with bed mobility (R sidelying, supine to sit, and sidelying > sit) in order to demo decr motion sensitivity.  Baseline:  Goal status: INITIAL  4.  DVA to be assessed with goal written as appropriate.  Baseline: goal not need as WNL Goal status:  INITIAL  ASSESSMENT:  CLINICAL IMPRESSION: Patient seen for skilled PT session with emphasis on initiating HEP. Patient demonstrates increased fall risk as noted by score of 21/30 on  Functional Gait Assessment.   <22/30 = predictive of falls, <20/30 = fall in 6  months, <18/30 = predictive of falls in PD MCID: 5 points stroke population, 4 points geriatric population (ANPTA Core Set of Outcome Measures for Adults with Neurologic Conditions, 2018). Patient tolerating HEP well, but did have noticeable difficulty completing horizontal VOR x1 requiring increased cuing to maintain gaze on target. Would benefit from progression of exercises to compliant surfaces. Continue POC.     OBJECTIVE IMPAIRMENTS: decreased activity tolerance, decreased balance, and dizziness.   ACTIVITY LIMITATIONS: bed mobility and locomotion level  PARTICIPATION LIMITATIONS: driving and community activity  PERSONAL FACTORS: Age, Behavior pattern, Past/current experiences, Time since onset of injury/illness/exacerbation, and 3+ comorbidities: Type II Diabetes, Migraine headaches, hx of breast cancer, HTN, Osteoporosis, Hypothyroidism, hx of TIA  are also affecting patient's functional outcome.   REHAB POTENTIAL: Good  CLINICAL DECISION MAKING: Stable/uncomplicated  EVALUATION COMPLEXITY: Low   PLAN:  PT FREQUENCY: 1x/week  PT DURATION: 4 weeks  PLANNED INTERVENTIONS: Therapeutic exercises, Therapeutic activity, Neuromuscular re-education, Balance training, Gait training, Patient/Family education, Self Care, Vestibular training, [place] repositioning, and Re-evaluation  PLAN FOR NEXT SESSION: gait with head turns/tracking, compliant surfaces   Debbora Dus, PT, DPT  Debbora Dus, PT, DPT, CBIS  08/27/2022, 11:01 AM

## 2022-08-28 NOTE — Telephone Encounter (Signed)
Left message with interpretor service. No call back closing encounter.

## 2022-09-03 ENCOUNTER — Ambulatory Visit: Payer: Medicare Other

## 2022-09-03 ENCOUNTER — Ambulatory Visit
Admission: RE | Admit: 2022-09-03 | Discharge: 2022-09-03 | Disposition: A | Discharge status: Home / Self Care | Payer: Medicare Other | Source: Ambulatory Visit | Attending: Family Medicine | Admitting: Family Medicine

## 2022-09-03 DIAGNOSIS — R293 Abnormal posture: Secondary | ICD-10-CM | POA: Diagnosis not present

## 2022-09-03 DIAGNOSIS — R42 Dizziness and giddiness: Secondary | ICD-10-CM

## 2022-09-03 DIAGNOSIS — R2681 Unsteadiness on feet: Secondary | ICD-10-CM | POA: Diagnosis not present

## 2022-09-03 DIAGNOSIS — E890 Postprocedural hypothyroidism: Secondary | ICD-10-CM | POA: Diagnosis not present

## 2022-09-03 DIAGNOSIS — E042 Nontoxic multinodular goiter: Secondary | ICD-10-CM | POA: Diagnosis not present

## 2022-09-03 DIAGNOSIS — E041 Nontoxic single thyroid nodule: Secondary | ICD-10-CM

## 2022-09-03 NOTE — Therapy (Signed)
OUTPATIENT PHYSICAL THERAPY VESTIBULAR TREATMENT     Patient Name: Destiny Moody MRN: [id number] DOB:[birth year], 71 y.o., female Today's Date: 09/03/2022  END OF SESSION:  PT End of Session - 09/03/22 0933     Visit Number 4    Number of Visits 5    Date for PT Re-Evaluation 09/16/22    PT Start Time 0931    PT Stop Time 1011    PT Time Calculation (min) 40 min    Activity Tolerance Patient tolerated treatment well    Behavior During Therapy [facility] for tasks assessed/performed             Past Medical History:  Diagnosis Date   Allergy    Arthritis    Breast cancer ([place]) 08/24/14   right, upper inner   Cancer ([place])    Chronic kidney disease    Depression    no meds   HSV (herpes simplex virus) anogenital infection 2018   Hypercholesterolemia    Hypertension    Hypothyroidism    Multinodular thyroid    NIDDM (non-insulin dependent diabetes mellitus)    Osteoporosis 2020   T score -2.5 stable from prior DEXA   Radiation 01/31/15-03/01/15   right  breast   Rheumatoid arthritis(714.0)    lower back   Wears contact lenses    Past Surgical History:  Procedure Laterality Date   BREAST LUMPECTOMY Right 2016   BREAST SURGERY     Lumpectomy   CESAREAN SECTION  0998,3382   x 2   CHOLECYSTECTOMY  1993   COLONOSCOPY  last 06/15/2016   greater than 12 years but not sure where colonoscopy was performed   INCISION AND DRAINAGE ABSCESS Right 11/24/2014   Procedure: INCISION AND DRAINAGE RIGHT AXILLA  ABSCESS;  Surgeon: Fanny Skates, MD;  Location: [place];  Service: General;  Laterality: Right;   RADIOACTIVE SEED GUIDED PARTIAL MASTECTOMY WITH AXILLARY SENTINEL LYMPH NODE BIOPSY Right 10/12/2014   Procedure: RIGHT  PARTIAL MASTECTOMY WITH RADIOACTIVE SEED LOCALIZATION  RIGHT  AXILLARY SENTINEL  NODE BIOPSY;  Surgeon: Fanny Skates, MD;  Location: [place];  Service: General;  Laterality: Right;   repair of lateral epicondylitis of right humerus  01/15/2021    THYROIDECTOMY, PARTIAL  2023   TUBAL LIGATION  1996   re annastomosis   [place] EXTRACTION     Patient Active Problem List   Diagnosis Date Noted   Adjustment disorder 08/04/2021   Claudication ([place]) 08/04/2021   Constipation 08/04/2021   Diabetic retinopathy associated with type 2 diabetes mellitus ([place]) 08/04/2021   Disorder of breast 08/04/2021   Facial paresthesia 08/04/2021   Hypertensive retinopathy 08/04/2021   Migraine 08/04/2021   Mixed anxiety and depressive disorder 08/04/2021   Major depression single episode, in partial remission ([place]) 08/04/2021   Moderate major depression, single episode ([place]) 08/04/2021   Pure hypercholesterolemia 08/04/2021   Bilateral sensorineural hearing loss 08/09/2020   Personal history of malignant neoplasm of breast 11/18/2019   Degeneration of lumbar intervertebral disc 11/18/2019   Scoliosis deformity of spine 11/18/2019   Sepsis due to urinary tract infection ([place]) 08/26/2019   Acute pyelonephritis 08/26/2019   Sepsis secondary to UTI ([place]) 05/08/2019   Hyperlipidemia associated with type 2 diabetes mellitus ([place])    Hypertension    Thyroid nodule    Age-related osteoporosis without current pathological fracture 12/27/2016   Genital herpes in women 12/20/2016   Wrist pain, chronic, right 08/01/2016   Genetic testing 04/27/2015   Family history of  colon cancer    Family history of ovarian cancer    Type 2 diabetes mellitus with unspecified complications ([place]) 00/93/8182   Malignant neoplasm of upper-inner quadrant of right breast in female, estrogen receptor positive ([place]) 08/26/2014   Osteoporosis 08/10/2014   Vaginal atrophy 04/27/2014   Type 2 diabetes mellitus ([place]) 04/17/2011   GERD 04/07/2008   FATTY LIVER DISEASE 04/07/2008    PCP: Caren Macadam, MD  REFERRING PROVIDER: Olen Cordial, PA  REFERRING DIAG: R42 (ICD-10-CM) - Dizziness and giddiness  THERAPY DIAG:  Dizziness and giddiness  Unsteadiness on  feet  Abnormal posture  ONSET DATE: 07/25/2022  Rationale for Evaluation and Treatment: Rehabilitation  SUBJECTIVE:   SUBJECTIVE STATEMENT: Patient reports doing well. Did have a tough weekend with remodeling her house. When asked if she had falls/near falls, patient explaining that she got lost in [facility]. Denies any dizziness since last visit. Also states that she has not been doing her exercises.   Pt accompanied by: self  PERTINENT HISTORY: PMH: Type II Diabetes, Migraine headaches, hx of breast cancer, HTN, Osteoporosis, Hypothyroidism, hx of TIA  PAIN:  Are you having pain? No  There were no vitals filed for this visit.  PRECAUTIONS: None   PATIENT GOALS: Wants to help the vertigo   OBJECTIVE:   DIAGNOSTIC FINDINGS:   On 08/29/2021: IMPRESSION: MRI:   1. No evidence of acute intracranial abnormality. 2. Moderate patchy T2/FLAIR hyperintensities in the white matter which are nonspecific but most likely related to chronic microvascular ischemic disease given the patient's risk factors. Chronic demyelination is a differential consideration.   MRA:   No large vessel occlusion or evidence of proximal hemodynamically significant stenosis.  VESTIBULAR TREATMENT:                                                                                                   -FOTO: 80 -2x 115' ball toss visual tracking  -2x 115' ball circles visual tracking   -denies dizziness, but reports that her brain is telling her "be careful, you may fall"  -seated VOR x1 horizontal 3x30s   -patient with B eye slippage, but PT unsure if this is related to a hypofunction or poor coordination of exercise as patient is unclear with her answers regarding what she is feeling  -seated VOR x1 vertical 3 x 30s   -patient with eye slippage again -seated on [employer] VOR x1 horizontal 3x30s  -denies increase in dizziness  -standing Hart Chart   -> standing on [employer]ire[employer] Hart Chart  -no increase in  sway or dizziness     PATIENT EDUCATION: Education details: HEP Person educated: Patient Education method: Explanation Education comprehension: verbalized understanding  HOME EXERCISE PROGRAM: Sit to Side-Lying    Sit on edge of bed. 1. Turn head 45 to right. 2. Maintain head position and lie down slowly on left side. Hold until symptoms subside. 3. Sit up slowly. Hold until symptoms subside. 4. Turn head 45 to left. 5. Maintain head position and lie down slowly on right side. Hold until symptoms subside. 6. Sit up slowly. Repeat sequence  __4__ times per session. Do ___2_ sessions per day.  Gaze Stabilization: Sitting    Eyes on target on wall 5 feet away, tilt head down 15-30 and move head side to side for _45___ seconds. Repeat while moving head up and down for __45__ seconds. Do __3__ sessions per day.  Access Code: [email or web address] URL: [email or web address] Date: 08/27/2022 Prepared by: Estevan Ryder  Exercises - Standing Balance in Corner with Eyes Closed  - 1 x daily - 7 x weekly - 3 sets - 30 hold - Corner Balance Feet Together With Eyes Open  - 1 x daily - 7 x weekly - 3 sets - 30 hold - Corner Balance Feet Together With Eyes Closed  - 1 x daily - 7 x weekly - 3 sets - 30 hold - Corner Balance Feet Together: Eyes Open With Head Turns  - 1 x daily - 7 x weekly - 3 sets - 10 reps - Semi-Tandem Corner Balance With Eyes Open  - 1 x daily - 7 x weekly - 3 sets - 30 hold - Semi-Tandem Corner Balance With Eyes Closed  - 1 x daily - 7 x weekly - 3 sets - 30 hold  GOALS: Goals reviewed with patient? Yes  SHORT TERM GOALS: ALL STGS = LTGS   LONG TERM GOALS: Target date: 09/14/2022  Pt will be independent with final HEP for vestibular deficits in order to build upon functional gains made in therapy. Baseline:  Goal status: INITIAL  2.  Patient will improve SOT composite score to >/= 52 to demonstrate improved vestibular function Baseline: 47 Goal status:  INITIAL  3.  Pt will demonstrate no dizziness with bed mobility (R sidelying, supine to sit, and sidelying > sit) in order to demo decr motion sensitivity.  Baseline:  Goal status: INITIAL  4.  DVA to be assessed with goal written as appropriate.  Baseline: goal not need as WNL Goal status: INITIAL  ASSESSMENT:  CLINICAL IMPRESSION: Patient seen for skilled PT session with emphasis on vestibular retraining. Patient continues to deny dizziness in between visits, but then will report random onset dizziness during the session. She continues to require increased cuing to complete exercises accurately for best results. Continue POC.     OBJECTIVE IMPAIRMENTS: decreased activity tolerance, decreased balance, and dizziness.   ACTIVITY LIMITATIONS: bed mobility and locomotion level  PARTICIPATION LIMITATIONS: driving and community activity  PERSONAL FACTORS: Age, Behavior pattern, Past/current experiences, Time since onset of injury/illness/exacerbation, and 3+ comorbidities: Type II Diabetes, Migraine headaches, hx of breast cancer, HTN, Osteoporosis, Hypothyroidism, hx of TIA  are also affecting patient's functional outcome.   REHAB POTENTIAL: Good  CLINICAL DECISION MAKING: Stable/uncomplicated  EVALUATION COMPLEXITY: Low   PLAN:  PT FREQUENCY: 1x/week  PT DURATION: 4 weeks  PLANNED INTERVENTIONS: Therapeutic exercises, Therapeutic activity, Neuromuscular re-education, Balance training, Gait training, Patient/Family education, Self Care, Vestibular training, Canalith repositioning, and Re-evaluation  PLAN FOR NEXT SESSION: goal check and dc?   Debbora Dus, PT, DPT  Debbora Dus, PT, DPT, CBIS  09/03/2022, 10:13 AM

## 2022-09-11 ENCOUNTER — Ambulatory Visit: Payer: Medicare Other

## 2022-09-11 DIAGNOSIS — R42 Dizziness and giddiness: Secondary | ICD-10-CM | POA: Diagnosis not present

## 2022-09-11 DIAGNOSIS — R293 Abnormal posture: Secondary | ICD-10-CM

## 2022-09-11 DIAGNOSIS — R2681 Unsteadiness on feet: Secondary | ICD-10-CM

## 2022-09-11 NOTE — Therapy (Signed)
OUTPATIENT PHYSICAL THERAPY VESTIBULAR TREATMENT/ DISCHARGE SUMMARY     Patient Name: Destiny Moody MRN: [id number] DOB:[birth year], 71 y.o., female Today's Date: 09/11/2022  PHYSICAL THERAPY DISCHARGE SUMMARY  Visits from Start of Care: 5  Current functional level related to goals / functional outcomes: See below   Remaining deficits: Intermittent and vague dizziness that PT is unable to recreate/reproduce and PT unable to investigate etiology better given patients ambiguous answers   Education / Equipment: PT POC, HEP   Patient agrees to discharge. Patient goals were met. Patient is being discharged due to meeting the stated rehab goals.  END OF SESSION:  PT End of Session - 09/11/22 1110     Visit Number 5    Number of Visits 5    Date for PT Re-Evaluation 09/16/22    PT Start Time 1109   patient late   PT Stop Time 0350   discharge   PT Time Calculation (min) 26 min    Activity Tolerance Patient tolerated treatment well    Behavior During Therapy [facility] for tasks assessed/performed             Past Medical History:  Diagnosis Date   Allergy    Arthritis    Breast cancer ([place]) 08/24/14   right, upper inner   Cancer ([place])    Chronic kidney disease    Depression    no meds   HSV (herpes simplex virus) anogenital infection 2018   Hypercholesterolemia    Hypertension    Hypothyroidism    Multinodular thyroid    NIDDM (non-insulin dependent diabetes mellitus)    Osteoporosis 2020   T score -2.5 stable from prior DEXA   Radiation 01/31/15-03/01/15   right  breast   Rheumatoid arthritis(714.0)    lower back   Wears contact lenses    Past Surgical History:  Procedure Laterality Date   BREAST LUMPECTOMY Right 2016   BREAST SURGERY     Lumpectomy   CESAREAN SECTION  0938,1829   x 2   CHOLECYSTECTOMY  1993   COLONOSCOPY  last 06/15/2016   greater than 12 years but not sure where colonoscopy was performed   INCISION AND DRAINAGE ABSCESS Right 11/24/2014    Procedure: INCISION AND DRAINAGE RIGHT AXILLA  ABSCESS;  Surgeon: Fanny Skates, MD;  Location: [place];  Service: General;  Laterality: Right;   RADIOACTIVE SEED GUIDED PARTIAL MASTECTOMY WITH AXILLARY SENTINEL LYMPH NODE BIOPSY Right 10/12/2014   Procedure: RIGHT  PARTIAL MASTECTOMY WITH RADIOACTIVE SEED LOCALIZATION  RIGHT  AXILLARY SENTINEL  NODE BIOPSY;  Surgeon: Fanny Skates, MD;  Location: [place];  Service: General;  Laterality: Right;   repair of lateral epicondylitis of right humerus  01/15/2021   THYROIDECTOMY, PARTIAL  2023   TUBAL LIGATION  1996   re annastomosis   [place] EXTRACTION     Patient Active Problem List   Diagnosis Date Noted   Adjustment disorder 08/04/2021   Claudication ([place]) 08/04/2021   Constipation 08/04/2021   Diabetic retinopathy associated with type 2 diabetes mellitus ([place]) 08/04/2021   Disorder of breast 08/04/2021   Facial paresthesia 08/04/2021   Hypertensive retinopathy 08/04/2021   Migraine 08/04/2021   Mixed anxiety and depressive disorder 08/04/2021   Major depression single episode, in partial remission ([place]) 08/04/2021   Moderate major depression, single episode ([place]) 08/04/2021   Pure hypercholesterolemia 08/04/2021   Bilateral sensorineural hearing loss 08/09/2020   Personal history of malignant neoplasm of breast 11/18/2019   Degeneration of lumbar intervertebral  disc 11/18/2019   Scoliosis deformity of spine 11/18/2019   Sepsis due to urinary tract infection ([place]) 08/26/2019   Acute pyelonephritis 08/26/2019   Sepsis secondary to UTI ([place]) 05/08/2019   Hyperlipidemia associated with type 2 diabetes mellitus ([place])    Hypertension    Thyroid nodule    Age-related osteoporosis without current pathological fracture 12/27/2016   Genital herpes in women 12/20/2016   Wrist pain, chronic, right 08/01/2016   Genetic testing 04/27/2015   Family history of colon cancer    Family history of ovarian cancer    Type 2  diabetes mellitus with unspecified complications ([place]) 07/11/8526   Malignant neoplasm of upper-inner quadrant of right breast in female, estrogen receptor positive ([place]) 08/26/2014   Osteoporosis 08/10/2014   Vaginal atrophy 04/27/2014   Type 2 diabetes mellitus ([place]) 04/17/2011   GERD 04/07/2008   FATTY LIVER DISEASE 04/07/2008    PCP: Caren Macadam, MD  REFERRING PROVIDER: Olen Cordial, PA  REFERRING DIAG: R42 (ICD-10-CM) - Dizziness and giddiness  THERAPY DIAG:  Dizziness and giddiness  Abnormal posture  Unsteadiness on feet  ONSET DATE: 07/25/2022  Rationale for Evaluation and Treatment: Rehabilitation  SUBJECTIVE:   SUBJECTIVE STATEMENT: Patient reports doing well. States she had a busy weekend with the holiday and that contributed to some increase in dizziness from baseline this weekend. Continues with vague answers to specific questions. Denies falls/near falls. Reports that exercises are "too slow to really help," but states she hasn't been doing the exercises "much at all."   Pt accompanied by: self  PERTINENT HISTORY: PMH: Type II Diabetes, Migraine headaches, hx of breast cancer, HTN, Osteoporosis, Hypothyroidism, hx of TIA  PAIN:  Are you having pain? No  There were no vitals filed for this visit.  PRECAUTIONS: None   PATIENT GOALS: Wants to help the vertigo   OBJECTIVE:   DIAGNOSTIC FINDINGS:   On 08/29/2021: IMPRESSION: MRI:   1. No evidence of acute intracranial abnormality. 2. Moderate patchy T2/FLAIR hyperintensities in the white matter which are nonspecific but most likely related to chronic microvascular ischemic disease given the patient's risk factors. Chronic demyelination is a differential consideration.   MRA:   No large vessel occlusion or evidence of proximal hemodynamically significant stenosis.  VESTIBULAR TREATMENT:                                                                                                   Motion  Sensitivity Quotient  Intensity: 0 = none, 1 = Lightheaded, 2 = Mild, 3 = Moderate, 4 = Severe, 5 = Vomiting  Intensity  1. Sitting to supine 0  2. Supine to L side 0  3. Supine to R side 0  4. Supine to sitting 0  5. L Hallpike-Dix   6. Up from L    7. R Hallpike-Dix   8. Up from R    9. Sitting, head  tipped to L knee 0  10. Head up from L  knee 1  11. Sitting, head  tipped to R knee 0  12. Head up from R  knee 0  13. Sitting head turns x5 1  14.Sitting head nods x5 1  15. In stance, 180  turn to L    16. In stance, 180  turn to R    -SOT:  -Condition 1: pass 2/3  -condition 2: pass 3/3  -condition 3: pass 3/3   -condition 4: pass 0/3  -condition 5: pass 1/3  -condition 6: pass 1/3  -composite: 60 (slightly below age-matched norms)      PATIENT EDUCATION: Education details: HEP, exam findings Person educated: Patient Education method: Explanation Education comprehension: verbalized understanding  HOME EXERCISE PROGRAM: Sit to Side-Lying    Sit on edge of bed. 1. Turn head 45 to right. 2. Maintain head position and lie down slowly on left side. Hold until symptoms subside. 3. Sit up slowly. Hold until symptoms subside. 4. Turn head 45 to left. 5. Maintain head position and lie down slowly on right side. Hold until symptoms subside. 6. Sit up slowly. Repeat sequence __4__ times per session. Do ___2_ sessions per day.  Gaze Stabilization: Sitting    Eyes on target on wall 5 feet away, tilt head down 15-30 and move head side to side for _45___ seconds. Repeat while moving head up and down for __45__ seconds. Do __3__ sessions per day.  Access Code: [email or web address] URL: [email or web address] Date: 08/27/2022 Prepared by: Estevan Ryder  Exercises - Standing Balance in Corner with Eyes Closed  - 1 x daily - 7 x weekly - 3 sets - 30 hold - Corner Balance Feet Together With Eyes Open  - 1 x daily - 7 x weekly - 3 sets - 30 hold - Corner Balance  Feet Together With Eyes Closed  - 1 x daily - 7 x weekly - 3 sets - 30 hold - Corner Balance Feet Together: Eyes Open With Head Turns  - 1 x daily - 7 x weekly - 3 sets - 10 reps - Semi-Tandem Corner Balance With Eyes Open  - 1 x daily - 7 x weekly - 3 sets - 30 hold - Semi-Tandem Corner Balance With Eyes Closed  - 1 x daily - 7 x weekly - 3 sets - 30 hold  GOALS: Goals reviewed with patient? Yes  SHORT TERM GOALS: ALL STGS = LTGS   LONG TERM GOALS: Target date: 09/14/2022  Pt will be independent with final HEP for vestibular deficits in order to build upon functional gains made in therapy. Baseline: provided Goal status: MET  2.  Patient will improve SOT composite score to >/= 52 to demonstrate improved vestibular function Baseline: 47; 60 Goal status: MET  3.  Pt will demonstrate no dizziness with bed mobility (R sidelying, supine to sit, and sidelying > sit) in order to demo decr motion sensitivity.  Baseline: no dizziness reported Goal status: MET  4.  DVA to be assessed with goal written as appropriate.  Baseline: goal not need as WNL Goal status: MET  ASSESSMENT:  CLINICAL IMPRESSION: Patient seen for skilled PT session with emphasis on goal assessment and dc. She met 4/4 LTG. She continues to report vague onset of dizziness, that PT has been unable to recreate/reproduce. Patient also with very vague answers to specific questions, or answers question with something completely unrelated to initial question, so PT is unable to further investigate source of dizziness. She is to dc from PT at this time, but may return with new referral if needed.     OBJECTIVE IMPAIRMENTS: decreased activity tolerance, decreased balance, and dizziness.   ACTIVITY  LIMITATIONS: bed mobility and locomotion level  PARTICIPATION LIMITATIONS: driving and community activity  PERSONAL FACTORS: Age, Behavior pattern, Past/current experiences, Time since onset of injury/illness/exacerbation, and 3+  comorbidities: Type II Diabetes, Migraine headaches, hx of breast cancer, HTN, Osteoporosis, Hypothyroidism, hx of TIA  are also affecting patient's functional outcome.   REHAB POTENTIAL: Good  CLINICAL DECISION MAKING: Stable/uncomplicated  EVALUATION COMPLEXITY: Low   PLAN:  PT FREQUENCY: 1x/week  PT DURATION: 4 weeks  PLANNED INTERVENTIONS: Therapeutic exercises, Therapeutic activity, Neuromuscular re-education, Balance training, Gait training, Patient/Family education, Self Care, Vestibular training, Canalith repositioning, and Re-evaluation  PLAN FOR NEXT SESSION: goal check and dc?   Debbora Dus, PT, DPT  Debbora Dus, PT, DPT, CBIS  09/11/2022, 11:40 AM

## 2022-09-25 ENCOUNTER — Other Ambulatory Visit: Payer: Self-pay | Admitting: Interventional Cardiology

## 2022-09-28 ENCOUNTER — Ambulatory Visit
Admission: RE | Admit: 2022-09-28 | Discharge: 2022-09-28 | Disposition: A | Discharge status: Home / Self Care | Payer: Medicare Other | Source: Ambulatory Visit | Attending: Obstetrics & Gynecology | Admitting: Obstetrics & Gynecology

## 2022-09-28 DIAGNOSIS — Z1231 Encounter for screening mammogram for malignant neoplasm of breast: Secondary | ICD-10-CM

## 2022-10-02 DIAGNOSIS — E890 Postprocedural hypothyroidism: Secondary | ICD-10-CM | POA: Diagnosis not present

## 2022-10-02 DIAGNOSIS — E118 Type 2 diabetes mellitus with unspecified complications: Secondary | ICD-10-CM | POA: Diagnosis not present

## 2022-10-02 DIAGNOSIS — E7800 Pure hypercholesterolemia, unspecified: Secondary | ICD-10-CM | POA: Diagnosis not present

## 2022-10-02 DIAGNOSIS — Z0000 Encounter for general adult medical examination without abnormal findings: Secondary | ICD-10-CM | POA: Diagnosis not present

## 2022-10-02 DIAGNOSIS — Z8673 Personal history of transient ischemic attack (TIA), and cerebral infarction without residual deficits: Secondary | ICD-10-CM | POA: Diagnosis not present

## 2022-10-02 DIAGNOSIS — M25522 Pain in left elbow: Secondary | ICD-10-CM | POA: Diagnosis not present

## 2022-10-02 DIAGNOSIS — M810 Age-related osteoporosis without current pathological fracture: Secondary | ICD-10-CM | POA: Diagnosis not present

## 2022-10-02 DIAGNOSIS — Z79899 Other long term (current) drug therapy: Secondary | ICD-10-CM | POA: Diagnosis not present

## 2022-10-22 ENCOUNTER — Other Ambulatory Visit: Payer: Self-pay

## 2022-10-22 MED ORDER — ROSUVASTATIN CALCIUM 20 MG PO TABS: 20.0000 mg | ORAL_TABLET | Freq: Every day | ORAL | 0 refills | Status: DC

## 2022-10-26 ENCOUNTER — Telehealth: Payer: Self-pay | Admitting: Interventional Cardiology

## 2022-10-26 MED ORDER — ROSUVASTATIN CALCIUM 20 MG PO TABS: 20.0000 mg | ORAL_TABLET | Freq: Every day | ORAL | 0 refills | Status: DC

## 2022-10-26 NOTE — Telephone Encounter (Signed)
*  STAT* If patient is at the pharmacy, call can be transferred to refill team.   1. Which medications need to be refilled? (please list name of each medication and dose if known) rosuvastatin (CRESTOR) 20 MG tablet   2. Which pharmacy/location (including street and city if local pharmacy) is medication to be sent to?  [facility] [address]    3. Do they need a 30 day or 90 day supply? 90 day supply    Has an appt scheduled for 02/27.

## 2022-10-26 NOTE — Telephone Encounter (Signed)
Pt's medication was sent to pt's pharmacy as requested. Confirmation received.  °

## 2022-11-11 NOTE — Progress Notes (Unsigned)
Office Visit    Patient Name: Destiny Moody Date of Encounter: 11/11/2022  Primary Care Provider:  Caren Macadam, MD Primary Cardiologist:  Larae Grooms, MD Primary Electrophysiologist: None  Chief Complaint    Destiny Moody is a 72 y.o. female with PMH of TIA, HTN, HLD, hypothyroidism, DM type II who presents today for 1 year follow-up.  Past Medical History    Past Medical History:  Diagnosis Date   Allergy    Arthritis    Breast cancer ([place]) 08/24/14   right, upper inner   Cancer ([place])    Chronic kidney disease    Depression    no meds   HSV (herpes simplex virus) anogenital infection 2018   Hypercholesterolemia    Hypertension    Hypothyroidism    Multinodular thyroid    NIDDM (non-insulin dependent diabetes mellitus)    Osteoporosis 2020   T score -2.5 stable from prior DEXA   Radiation 01/31/15-03/01/15   right  breast   Rheumatoid arthritis(714.0)    lower back   Wears contact lenses    Past Surgical History:  Procedure Laterality Date   BREAST LUMPECTOMY Right 2016   BREAST SURGERY     Lumpectomy   CESAREAN SECTION  [id number]   x 2   CHOLECYSTECTOMY  1993   COLONOSCOPY  last 06/15/2016   greater than 12 years but not sure where colonoscopy was performed   INCISION AND DRAINAGE ABSCESS Right 11/24/2014   Procedure: INCISION AND DRAINAGE RIGHT AXILLA  ABSCESS;  Surgeon: Fanny Skates, MD;  Location: [facility];  Service: General;  Laterality: Right;   RADIOACTIVE SEED GUIDED PARTIAL MASTECTOMY WITH AXILLARY SENTINEL LYMPH NODE BIOPSY Right 10/12/2014   Procedure: RIGHT  PARTIAL MASTECTOMY WITH RADIOACTIVE SEED LOCALIZATION  RIGHT  AXILLARY SENTINEL  NODE BIOPSY;  Surgeon: Fanny Skates, MD;  Location: [facility];  Service: General;  Laterality: Right;   repair of lateral epicondylitis of right humerus  01/15/2021   THYROIDECTOMY, PARTIAL  2023   TUBAL LIGATION  1996   re annastomosis   WISDOM TOOTH EXTRACTION       Allergies  Allergies  Allergen Reactions   Shellfish Allergy Hives    unsure    History of Present Illness    Destiny Moody  is a 72 year old female with the above mention past medical history who presents today for 1 year follow-up.  She was initially seen by Dr. Irish Lack on 09/13/2021 for complaint of TIA.  She underwent an MRI and carotid Dopplers which were both okay.  She had no recurrent symptoms at that time.  She has a family history of CAD with both parents having MIs and a sister with an enlarged heart.  She underwent 2D echo that revealed normal EF of 60 to 65% with no RWMA and grade 1 DD with normal valve structures.  Since last being seen in the office patient reports***.  Patient denies chest pain, palpitations, dyspnea, PND, orthopnea, nausea, vomiting, dizziness, syncope, edema, weight gain, or early satiety.   ***Notes:  Home Medications    Current Outpatient Medications  Medication Sig Dispense Refill   amitriptyline (ELAVIL) 25 MG tablet TAKE ONE TABLET BY MOUTH AT BEDTIME  "OFFICE VISIT NEEDED" (Patient not taking: Reported on 03/27/2022) 90 tablet 3   Blood Glucose Monitoring Suppl (ONETOUCH VERIO FLEX SYSTEM) w/Device KIT      Calcium Carbonate-Vitamin D 600-200 MG-UNIT TABS Take 1 tablet by mouth daily.      DULoxetine (CYMBALTA)  30 MG capsule 1 capsule     glimepiride (AMARYL) 2 MG tablet Take 2 mg by mouth daily.     Glucosamine 500 MG CAPS Take 1 capsule by mouth daily.     ibandronate (BONIVA) 150 MG tablet Take 1 tablet (150 mg total) by mouth every 30 (thirty) days. Take in the morning with a full glass of water, on an empty stomach, and do not take anything else by mouth or lie down for the next 30 min. (Patient not taking: Reported on 03/27/2022) 3 tablet 2   levothyroxine (SYNTHROID) 25 MCG tablet Take 25 mcg by mouth every morning.     liraglutide (VICTOZA) 18 MG/3ML SOPN Inject 1.2 mg into the skin daily.     lisinopril (ZESTRIL) 2.5 MG tablet Take  2.5 mg by mouth daily.     metFORMIN (GLUCOPHAGE-XR) 500 MG 24 hr tablet TAKE TWO TABLETS BY MOUTH TWICE DAILY (Patient taking differently: Take 1,000 mg by mouth in the morning and at bedtime.) 360 tablet 0   Multiple Vitamin (MULTIVITAMIN) capsule Take 1 capsule by mouth daily.     Omega-3 Fatty Acids (OMEGA-3 FISH OIL PO) Take 1 capsule by mouth daily.      pioglitazone (ACTOS) 45 MG tablet Take 45 mg by mouth daily.     rosuvastatin (CRESTOR) 20 MG tablet Take 1 tablet (20 mg total) by mouth daily. 30 tablet 0   TURMERIC PO Take 1 capsule by mouth daily.     No current facility-administered medications for this visit.     Review of Systems  Please see the history of present illness.    (+)*** (+)***  All other systems reviewed and are otherwise negative except as noted above.  Physical Exam    Wt Readings from Last 3 Encounters:  03/27/22 135 lb (61.2 kg)  03/26/22 135 lb (61.2 kg)  03/15/22 134 lb (60.8 kg)   [id number] were no vitals filed for this visit.,There is no height or weight on file to calculate BMI.  Constitutional:      Appearance: Healthy appearance. Not in distress.  Neck:     Vascular: JVD normal.  Pulmonary:     Effort: Pulmonary effort is normal.     Breath sounds: No wheezing. No rales. Diminished in the bases Cardiovascular:     Normal rate. Regular rhythm. Normal S1. Normal S2.      Murmurs: There is no murmur.  Edema:    Peripheral edema absent.  Abdominal:     Palpations: Abdomen is soft non tender. There is no hepatomegaly.  Skin:    General: Skin is warm and dry.  Neurological:     General: No focal deficit present.     Mental Status: Alert and oriented to person, place and time.     Cranial Nerves: Cranial nerves are intact.  EKG/LABS/Other Studies Reviewed    ECG personally reviewed by me today - ***  Risk Assessment/Calculations:   {Does this patient have ATRIAL FIBRILLATION?[phone]}        Lab Results  Component Value Date    WBC 10.2 02/16/2022   HGB 12.0 02/16/2022   HCT 35.8 (L) 02/16/2022   MCV 95.5 02/16/2022   PLT 264 02/16/2022   Lab Results  Component Value Date   CREATININE 0.98 02/16/2022   BUN 26 (H) 02/16/2022   NA 133 (L) 02/16/2022   K 4.5 02/16/2022   CL 104 02/16/2022   CO2 17 (L) 02/16/2022   Lab Results  Component Value Date  ALT 18 12/12/2021   AST 21 12/12/2021   ALKPHOS 80 12/12/2021   BILITOT 0.3 12/12/2021   Lab Results  Component Value Date   CHOL 116 12/12/2021   HDL 52 12/12/2021   LDLCALC 50 12/12/2021   TRIG 65 12/12/2021   CHOLHDL 2.2 12/12/2021    Lab Results  Component Value Date   HGBA1C 7.0 (H) 08/26/2019    Assessment & Plan    1.  History of TIA: -04/2021 patient suffered left-sided weakness with facial numbness that lasted 5 minutes.  She underwent MRI and carotid Dopplers that were both normal.  2.  Hyperlipidemia: -Patient's last LDL cholesterol was*** -Continue***  3.  Hypertension: -Patient's blood pressure today was*** -Continue***  4.  DM type II: -Patient's last hemoglobin A1c was***      Disposition: Follow-up with Larae Grooms, MD or APP in *** months {Are you ordering a CV Procedure (e.g. stress test, cath, DCCV, TEE, etc)?   Press F2        :[id number]   Medication Adjustments/Labs and Tests Ordered: Current medicines are reviewed at length with the patient today.  Concerns regarding medicines are outlined above.   Signed, Mable Fill, Marissa Nestle, NP 11/11/2022, 8:07 PM [facility]  Note:  This document was prepared using Dragon voice recognition software and may include unintentional dictation errors.

## 2022-11-13 ENCOUNTER — Ambulatory Visit: Payer: Medicare Other | Attending: Nurse Practitioner | Admitting: Nurse Practitioner

## 2022-11-13 ENCOUNTER — Encounter: Payer: Self-pay | Admitting: Nurse Practitioner

## 2022-11-13 VITALS — BP 110/52 | HR 75 | Ht 61.5 in | Wt 134.6 lb

## 2022-11-13 DIAGNOSIS — E118 Type 2 diabetes mellitus with unspecified complications: Secondary | ICD-10-CM | POA: Diagnosis not present

## 2022-11-13 DIAGNOSIS — E785 Hyperlipidemia, unspecified: Secondary | ICD-10-CM | POA: Diagnosis not present

## 2022-11-13 DIAGNOSIS — I158 Other secondary hypertension: Secondary | ICD-10-CM | POA: Diagnosis not present

## 2022-11-13 DIAGNOSIS — E1169 Type 2 diabetes mellitus with other specified complication: Secondary | ICD-10-CM | POA: Diagnosis not present

## 2022-11-13 DIAGNOSIS — Z8673 Personal history of transient ischemic attack (TIA), and cerebral infarction without residual deficits: Secondary | ICD-10-CM

## 2022-11-13 MED ORDER — ROSUVASTATIN CALCIUM 20 MG PO TABS
20.0000 mg | ORAL_TABLET | Freq: Every day | ORAL | 2 refills | Status: AC
Start: 2022-11-13 — End: ?

## 2022-11-13 NOTE — Patient Instructions (Signed)
Medication Instructions:  RESTART Aspirin '81mg'$  Take 1 tablet once a day *If you need a refill on your cardiac medications before your next appointment, please call your pharmacy*   Lab Work: None ordered If you have labs (blood work) drawn today and your tests are completely normal, you will receive your results only by: [place] (if you have MyChart) OR A paper copy in the mail If you have any lab test that is abnormal or we need to change your treatment, we will call you to review the results.   Testing/Procedures: None ordered   Follow-Up: At [facility], you and your health needs are our priority.  As part of our continuing mission to provide you with exceptional heart care, we have created designated Provider Care Teams.  These Care Teams include your primary Cardiologist (physician) and Advanced Practice Providers (APPs -  Physician Assistants and Nurse Practitioners) who all work together to provide you with the care you need, when you need it.  We recommend signing up for the patient portal called "MyChart".  Sign up information is provided on this After Visit Summary.  MyChart is used to connect with patients for Virtual Visits (Telemedicine).  Patients are able to view lab/test results, encounter notes, upcoming appointments, etc.  Non-urgent messages can be sent to your provider as well.   To learn more about what you can do with MyChart, go to [email or web address].    Your next appointment:   AS NEEDED    Provider:   Larae Grooms, MD     Other Instructions

## 2022-12-13 DIAGNOSIS — M7712 Lateral epicondylitis, left elbow: Secondary | ICD-10-CM | POA: Diagnosis not present

## 2022-12-13 DIAGNOSIS — J069 Acute upper respiratory infection, unspecified: Secondary | ICD-10-CM | POA: Diagnosis not present

## 2022-12-13 DIAGNOSIS — M654 Radial styloid tenosynovitis [de Quervain]: Secondary | ICD-10-CM | POA: Diagnosis not present

## 2022-12-18 DIAGNOSIS — J329 Chronic sinusitis, unspecified: Secondary | ICD-10-CM | POA: Diagnosis not present

## 2022-12-18 DIAGNOSIS — J029 Acute pharyngitis, unspecified: Secondary | ICD-10-CM | POA: Diagnosis not present

## 2022-12-25 DIAGNOSIS — M25522 Pain in left elbow: Secondary | ICD-10-CM | POA: Diagnosis not present

## 2022-12-27 DIAGNOSIS — E11319 Type 2 diabetes mellitus with unspecified diabetic retinopathy without macular edema: Secondary | ICD-10-CM | POA: Diagnosis not present

## 2022-12-27 DIAGNOSIS — I700 Atherosclerosis of aorta: Secondary | ICD-10-CM | POA: Diagnosis not present

## 2023-01-01 DIAGNOSIS — M7712 Lateral epicondylitis, left elbow: Secondary | ICD-10-CM | POA: Diagnosis not present

## 2023-01-01 DIAGNOSIS — M654 Radial styloid tenosynovitis [de Quervain]: Secondary | ICD-10-CM | POA: Diagnosis not present

## 2023-01-02 ENCOUNTER — Telehealth: Payer: Self-pay | Admitting: *Deleted

## 2023-01-02 NOTE — Telephone Encounter (Signed)
   Pre-operative Risk Assessment    Patient Name: Destiny Moody  DOB: 1951-03-10 MRN: [id number]      Request for Surgical Clearance    Procedure:   Left Elbow Lateral Epicondylar Debridement and repair.  Date of Surgery:  Clearance TBD                                 Surgeon:  Dr. Bradly Bienenstock Surgeon's Group or Practice Name:  [facility] Phone number:  [phone] Fax number:  [phone]   Type of Clearance Requested:   - Medical  - Pharmacy:  Hold Aspirin Not Indicated.   Type of Anesthesia:   Regional and IV Sedation.    Additional requests/questions:    Signed, Emmit Pomfret   01/02/2023, 10:43 AM

## 2023-01-02 NOTE — Telephone Encounter (Signed)
   Name: Destiny Moody  DOB: June 23, 1951  MRN: [id number]  Primary Cardiologist: Lance Muss, MD  Chart reviewed as part of pre-operative protocol coverage. Because of Destiny Moody's past medical history and time since last visit, she will require a follow-up  telephone visit in order to better assess preoperative cardiovascular risk.  Pre-op covering staff: - Please schedule appointment and call patient to inform them. If patient already had an upcoming appointment within acceptable timeframe, please add "pre-op clearance" to the appointment notes so provider is aware. - Please contact requesting surgeon's office via preferred method (i.e, phone, fax) to inform them of need for appointment prior to surgery.  Would be fine to hold ASA x 5 to 7 days prior to procedure from a cardiac standpoint.  Patient with history of TIA so would also get recommendation from primary.  Destiny Dory, PA-C  01/02/2023, 11:51 AM

## 2023-01-02 NOTE — Telephone Encounter (Signed)
Left message to call back to schedule tele pre op appt.  

## 2023-01-04 ENCOUNTER — Telehealth: Payer: Self-pay

## 2023-01-04 NOTE — Telephone Encounter (Signed)
Patient is scheduled for tele visit on 01/10/23. Med rec and consent done

## 2023-01-04 NOTE — Telephone Encounter (Signed)
  Patient Consent for Virtual Visit         Destiny Moody has provided verbal consent on 01/04/2023 for a virtual visit (video or telephone).   CONSENT FOR VIRTUAL VISIT FOR:  Destiny Moody  By participating in this virtual visit I agree to the following:  I hereby voluntarily request, consent and authorize [facility] and its employed or contracted physicians, physician assistants, nurse practitioners or other licensed health care professionals (the Practitioner), to provide me with telemedicine health care services (the "Services") as deemed necessary by the treating Practitioner. I acknowledge and consent to receive the Services by the Practitioner via telemedicine. I understand that the telemedicine visit will involve communicating with the Practitioner through live audiovisual communication technology and the disclosure of certain medical information by electronic transmission. I acknowledge that I have been given the opportunity to request an in-person assessment or other available alternative prior to the telemedicine visit and am voluntarily participating in the telemedicine visit.  I understand that I have the right to withhold or withdraw my consent to the use of telemedicine in the course of my care at any time, without affecting my right to future care or treatment, and that the Practitioner or I may terminate the telemedicine visit at any time. I understand that I have the right to inspect all information obtained and/or recorded in the course of the telemedicine visit and may receive copies of available information for a reasonable fee.  I understand that some of the potential risks of receiving the Services via telemedicine include:  Delay or interruption in medical evaluation due to technological equipment failure or disruption; Information transmitted may not be sufficient (e.g. poor resolution of images) to allow for appropriate medical decision making by the Practitioner;  and/or  In rare instances, security protocols could fail, causing a breach of personal health information.  Furthermore, I acknowledge that it is my responsibility to provide information about my medical history, conditions and care that is complete and accurate to the best of my ability. I acknowledge that Practitioner's advice, recommendations, and/or decision may be based on factors not within their control, such as incomplete or inaccurate data provided by me or distortions of diagnostic images or specimens that may result from electronic transmissions. I understand that the practice of medicine is not an exact science and that Practitioner makes no warranties or guarantees regarding treatment outcomes. I acknowledge that a copy of this consent can be made available to me via my patient portal [facility] MyChart), or I can request a printed copy by calling the office of [facility].    I understand that my insurance will be billed for this visit.   I have read or had this consent read to me. I understand the contents of this consent, which adequately explains the benefits and risks of the Services being provided via telemedicine.  I have been provided ample opportunity to ask questions regarding this consent and the Services and have had my questions answered to my satisfaction. I give my informed consent for the services to be provided through the use of telemedicine in my medical care

## 2023-01-10 ENCOUNTER — Ambulatory Visit: Payer: Medicare Other | Attending: Cardiology | Admitting: Nurse Practitioner

## 2023-01-10 DIAGNOSIS — Z01810 Encounter for preprocedural cardiovascular examination: Secondary | ICD-10-CM

## 2023-01-10 NOTE — Progress Notes (Signed)
Virtual Visit via Telephone Note   Because of Destiny Moody's co-morbid illnesses, she is at least at moderate risk for complications without adequate follow up.  This format is felt to be most appropriate for this patient at this time.  The patient did not have access to video technology/had technical difficulties with video requiring transitioning to audio format only (telephone).  All issues noted in this document were discussed and addressed.  No physical exam could be performed with this format.  Please refer to the patient's chart for her consent to telehealth for [facility].  Evaluation Performed:  Preoperative cardiovascular risk assessment _____________   Date:  01/10/2023   Patient ID:  Destiny Moody, DOB 04-03-1951, MRN [id number] Patient Location:  Home Provider location:   Office  Primary Care Provider:  Aliene Beams, MD Primary Cardiologist:  Lance Muss, MD  Chief Complaint / Patient Profile   72 y.o. y/o female with a h/o TIA, hypertension, hyperlipidemia, hypothyroidism, and type 2 diabetes who is pending  Left Elbow Lateral Epicondylar Debridement and repair with Dr. Bradly Bienenstock of [facility] and presents today for telephonic preoperative cardiovascular risk assessment.  History of Present Illness    Destiny Moody is a 72 y.o. female who presents via audio/video conferencing for a telehealth visit today.  Pt was last seen in cardiology clinic on 11/13/2022 by Robin Searing, NP. At that time Destiny Moody was doing well.  The patient is now pending procedure as outlined above. Since her last visit, she has done well from a cardiac standpoint.   She denies chest pain, palpitations, dyspnea, pnd, orthopnea, n, v, dizziness, syncope, edema, weight gain, or early satiety. All other systems reviewed and are otherwise negative except as noted above.   Past Medical History    Past Medical History:  Diagnosis Date   Allergy    Arthritis    Breast cancer  ([facility]) 08/24/14   right, upper inner   Cancer ([facility])    Chronic kidney disease    Depression    no meds   HSV (herpes simplex virus) anogenital infection 2018   Hypercholesterolemia    Hypertension    Hypothyroidism    Multinodular thyroid    NIDDM (non-insulin dependent diabetes mellitus)    Osteoporosis 2020   T score -2.5 stable from prior DEXA   Radiation 01/31/15-03/01/15   right  breast   Rheumatoid arthritis(714.0)    lower back   Wears contact lenses    Past Surgical History:  Procedure Laterality Date   BREAST LUMPECTOMY Right 2016   BREAST SURGERY     Lumpectomy   CESAREAN SECTION  4098,1191   x 2   CHOLECYSTECTOMY  1993   COLONOSCOPY  last 06/15/2016   greater than 12 years but not sure where colonoscopy was performed   INCISION AND DRAINAGE ABSCESS Right 11/24/2014   Procedure: INCISION AND DRAINAGE RIGHT AXILLA  ABSCESS;  Surgeon: Claud Kelp, MD;  Location: [facility];  Service: General;  Laterality: Right;   RADIOACTIVE SEED GUIDED PARTIAL MASTECTOMY WITH AXILLARY SENTINEL LYMPH NODE BIOPSY Right 10/12/2014   Procedure: RIGHT  PARTIAL MASTECTOMY WITH RADIOACTIVE SEED LOCALIZATION  RIGHT  AXILLARY SENTINEL  NODE BIOPSY;  Surgeon: Claud Kelp, MD;  Location: [facility];  Service: General;  Laterality: Right;   repair of lateral epicondylitis of right humerus  01/15/2021   THYROIDECTOMY, PARTIAL  2023   TUBAL LIGATION  1996   re annastomosis   WISDOM TOOTH EXTRACTION  Allergies  Allergies  Allergen Reactions   Shellfish Allergy Hives    unsure    Home Medications    Prior to Admission medications   Medication Sig Start Date End Date Taking? Authorizing Provider  amitriptyline (ELAVIL) 25 MG tablet TAKE ONE TABLET BY MOUTH AT BEDTIME  "OFFICE VISIT NEEDED" Patient not taking: Reported on 01/04/2023 10/18/15   Copland, Gwenlyn Found, MD  aspirin EC 81 MG tablet Take 81 mg by mouth daily. 08/09/20   [provider]  Blood Glucose  Monitoring Suppl (ONETOUCH VERIO FLEX SYSTEM) w/Device KIT  06/03/19   [provider]  Calcium Carbonate-Vitamin D 600-200 MG-UNIT TABS Take 1 tablet by mouth daily.     [provider]  DULoxetine (CYMBALTA) 30 MG capsule 1 capsule    [provider]  glimepiride (AMARYL) 2 MG tablet Take 2 mg by mouth daily. 12/09/21   [provider]  Glucosamine 500 MG CAPS Take 1 capsule by mouth daily.    [provider]  ibandronate (BONIVA) 150 MG tablet Take 1 tablet (150 mg total) by mouth every 30 (thirty) days. Take in the morning with a full glass of water, on an empty stomach, and do not take anything else by mouth or lie down for the next 30 min. Patient not taking: Reported on 01/04/2023 07/18/21   Genia Del, MD  levothyroxine (SYNTHROID) 25 MCG tablet Take 25 mcg by mouth every morning. 02/21/22   [provider]  lisinopril (ZESTRIL) 2.5 MG tablet Take 2.5 mg by mouth daily. 07/07/19   [provider]  metFORMIN (GLUCOPHAGE-XR) 500 MG 24 hr tablet TAKE TWO TABLETS BY MOUTH TWICE DAILY Patient taking differently: Take 1,000 mg by mouth in the morning and at bedtime. 10/28/16   Doristine Bosworth, MD  MOUNJARO 5 MG/0.5ML Pen Inject 5 mg into the skin once a week.    [provider]  Multiple Vitamin (MULTIVITAMIN) capsule Take 1 capsule by mouth daily.    [provider]  Omega-3 Fatty Acids (OMEGA-3 FISH OIL PO) Take 1 capsule by mouth daily.     [provider]  pioglitazone (ACTOS) 45 MG tablet Take 45 mg by mouth daily. 06/12/21   [provider]  rosuvastatin (CRESTOR) 20 MG tablet Take 1 tablet (20 mg total) by mouth daily. 11/13/22   Gaston Islam., NP  TURMERIC PO Take 1 capsule by mouth daily.    [provider]    Physical Exam    Vital Signs:  Destiny Moody does not have vital signs available for review today.  Given telephonic nature of communication, physical exam is  limited. AAOx3. NAD. Normal affect.  Speech and respirations are unlabored.  Accessory Clinical Findings    None  Assessment & Plan    1.  Preoperative Cardiovascular Risk Assessment:  According to the Revised Cardiac Risk Index (RCRI), her Perioperative Risk of Major Cardiac Event is (%): 0.9. Her Functional Capacity in METs is: 5.72 according to the [place] Activity Status Index (DASI).Therefore, based on ACC/AHA guidelines, patient would be at acceptable risk for the planned procedure without further cardiovascular testing.   The patient was advised that if she develops new symptoms prior to surgery to contact our office to arrange for a follow-up visit, and she verbalized understanding.  Patient on aspirin for history of TIA, this is not managed by cardiology.  Therefore, recommendations for holding aspirin prior to surgery should come from managing provider (PCP).  A copy of this  note will be routed to requesting surgeon.  Time:   Today, I have spent 5 minutes with the patient with telehealth technology discussing medical history, symptoms, and management plan.     Joylene Grapes, NP  01/10/2023, 2:29 PM

## 2023-01-11 DIAGNOSIS — M25371 Other instability, right ankle: Secondary | ICD-10-CM | POA: Diagnosis not present

## 2023-01-25 DIAGNOSIS — E7800 Pure hypercholesterolemia, unspecified: Secondary | ICD-10-CM | POA: Diagnosis not present

## 2023-01-25 DIAGNOSIS — E11319 Type 2 diabetes mellitus with unspecified diabetic retinopathy without macular edema: Secondary | ICD-10-CM | POA: Diagnosis not present

## 2023-01-25 DIAGNOSIS — I2510 Atherosclerotic heart disease of native coronary artery without angina pectoris: Secondary | ICD-10-CM | POA: Diagnosis not present

## 2023-01-25 DIAGNOSIS — G459 Transient cerebral ischemic attack, unspecified: Secondary | ICD-10-CM | POA: Diagnosis not present

## 2023-01-25 DIAGNOSIS — Z01818 Encounter for other preprocedural examination: Secondary | ICD-10-CM | POA: Diagnosis not present

## 2023-01-25 DIAGNOSIS — E038 Other specified hypothyroidism: Secondary | ICD-10-CM | POA: Diagnosis not present

## 2023-01-25 DIAGNOSIS — M25522 Pain in left elbow: Secondary | ICD-10-CM | POA: Diagnosis not present

## 2023-02-13 DIAGNOSIS — M7712 Lateral epicondylitis, left elbow: Secondary | ICD-10-CM | POA: Diagnosis not present

## 2023-02-26 DIAGNOSIS — M7712 Lateral epicondylitis, left elbow: Secondary | ICD-10-CM | POA: Diagnosis not present

## 2023-02-26 DIAGNOSIS — M25622 Stiffness of left elbow, not elsewhere classified: Secondary | ICD-10-CM | POA: Diagnosis not present

## 2023-02-27 DIAGNOSIS — E038 Other specified hypothyroidism: Secondary | ICD-10-CM | POA: Diagnosis not present

## 2023-02-27 DIAGNOSIS — E11319 Type 2 diabetes mellitus with unspecified diabetic retinopathy without macular edema: Secondary | ICD-10-CM | POA: Diagnosis not present

## 2023-02-28 DIAGNOSIS — E7800 Pure hypercholesterolemia, unspecified: Secondary | ICD-10-CM | POA: Diagnosis not present

## 2023-02-28 DIAGNOSIS — E118 Type 2 diabetes mellitus with unspecified complications: Secondary | ICD-10-CM | POA: Diagnosis not present

## 2023-03-04 DIAGNOSIS — M25622 Stiffness of left elbow, not elsewhere classified: Secondary | ICD-10-CM | POA: Diagnosis not present

## 2023-03-04 DIAGNOSIS — M7712 Lateral epicondylitis, left elbow: Secondary | ICD-10-CM | POA: Diagnosis not present

## 2023-03-13 DIAGNOSIS — M7712 Lateral epicondylitis, left elbow: Secondary | ICD-10-CM | POA: Diagnosis not present

## 2023-03-13 DIAGNOSIS — M25622 Stiffness of left elbow, not elsewhere classified: Secondary | ICD-10-CM | POA: Diagnosis not present

## 2023-03-16 DIAGNOSIS — E119 Type 2 diabetes mellitus without complications: Secondary | ICD-10-CM | POA: Diagnosis not present

## 2023-03-20 DIAGNOSIS — M25622 Stiffness of left elbow, not elsewhere classified: Secondary | ICD-10-CM | POA: Diagnosis not present

## 2023-03-20 DIAGNOSIS — M7712 Lateral epicondylitis, left elbow: Secondary | ICD-10-CM | POA: Diagnosis not present

## 2023-03-26 DIAGNOSIS — M7712 Lateral epicondylitis, left elbow: Secondary | ICD-10-CM | POA: Diagnosis not present

## 2023-03-26 DIAGNOSIS — M25622 Stiffness of left elbow, not elsewhere classified: Secondary | ICD-10-CM | POA: Diagnosis not present

## 2023-04-01 DIAGNOSIS — M7712 Lateral epicondylitis, left elbow: Secondary | ICD-10-CM | POA: Diagnosis not present

## 2023-04-01 DIAGNOSIS — M25622 Stiffness of left elbow, not elsewhere classified: Secondary | ICD-10-CM | POA: Diagnosis not present

## 2023-07-02 DIAGNOSIS — E7800 Pure hypercholesterolemia, unspecified: Secondary | ICD-10-CM | POA: Diagnosis not present

## 2023-07-02 DIAGNOSIS — E039 Hypothyroidism, unspecified: Secondary | ICD-10-CM | POA: Diagnosis not present

## 2023-07-02 DIAGNOSIS — E1169 Type 2 diabetes mellitus with other specified complication: Secondary | ICD-10-CM | POA: Diagnosis not present

## 2023-07-02 DIAGNOSIS — Z8673 Personal history of transient ischemic attack (TIA), and cerebral infarction without residual deficits: Secondary | ICD-10-CM | POA: Diagnosis not present

## 2023-07-02 DIAGNOSIS — I6523 Occlusion and stenosis of bilateral carotid arteries: Secondary | ICD-10-CM | POA: Diagnosis not present

## 2023-07-02 DIAGNOSIS — E119 Type 2 diabetes mellitus without complications: Secondary | ICD-10-CM | POA: Diagnosis not present

## 2023-07-02 DIAGNOSIS — I739 Peripheral vascular disease, unspecified: Secondary | ICD-10-CM | POA: Diagnosis not present

## 2023-07-12 ENCOUNTER — Other Ambulatory Visit: Payer: Self-pay | Admitting: *Deleted

## 2023-07-12 DIAGNOSIS — I739 Peripheral vascular disease, unspecified: Secondary | ICD-10-CM

## 2023-07-12 DIAGNOSIS — R0989 Other specified symptoms and signs involving the circulatory and respiratory systems: Secondary | ICD-10-CM

## 2023-07-19 ENCOUNTER — Ambulatory Visit (INDEPENDENT_AMBULATORY_CARE_PROVIDER_SITE_OTHER)
Admission: RE | Admit: 2023-07-19 | Discharge: 2023-07-19 | Disposition: A | Discharge status: Home / Self Care | Payer: Medicare Other | Source: Ambulatory Visit | Attending: Vascular Surgery | Admitting: Vascular Surgery

## 2023-07-19 ENCOUNTER — Ambulatory Visit (HOSPITAL_COMMUNITY)
Admission: RE | Admit: 2023-07-19 | Discharge: 2023-07-19 | Disposition: A | Discharge status: Home / Self Care | Payer: Medicare Other | Source: Ambulatory Visit | Attending: Vascular Surgery | Admitting: Vascular Surgery

## 2023-07-19 DIAGNOSIS — R0989 Other specified symptoms and signs involving the circulatory and respiratory systems: Secondary | ICD-10-CM | POA: Insufficient documentation

## 2023-07-19 DIAGNOSIS — I739 Peripheral vascular disease, unspecified: Secondary | ICD-10-CM | POA: Diagnosis not present

## 2023-07-20 LAB — VAS US ABI WITH/WO TBI
Left ABI: 1.16
Right ABI: 1.12

## 2023-07-24 ENCOUNTER — Ambulatory Visit: Payer: Medicare Other | Admitting: Vascular Surgery

## 2023-07-24 VITALS — BP 117/69 | HR 53 | Temp 98.2°F | Ht 62.0 in | Wt 128.1 lb

## 2023-07-24 DIAGNOSIS — I6521 Occlusion and stenosis of right carotid artery: Secondary | ICD-10-CM | POA: Diagnosis not present

## 2023-07-24 NOTE — Progress Notes (Signed)
[facility] [place]  ASSESSMENT / PLAN: 72 y.o. female with episodic lower extremity cramping. No evidence of hemodynamically significant peripheral arterial disease to explain cramping. Mild chronic venous insufficiency. Very mild carotid artery stenosis.  Recommend:  Abstinence from all tobacco products. Blood glucose control with goal A1c < 7%. Blood pressure control with goal blood pressure < 140/90 mmHg. Lipid reduction therapy with goal LDL-C <100 mg/dL. Aspirin 81mg  PO QD.  Atorvastatin 40-80mg  PO QD (or other "high intensity" statin therapy).  Follow up in 1 year with carotid duplex to monitor carotid stenosis  CHIEF COMPLAINT: "claudication"  HISTORY OF PRESENT ILLNESS: Destiny Moody is a 72 y.o. female who returns to clinic for evaluation of cramping pain in her legs at night.  The patient splits her time between [place] and [place].  She reports she is diagnosed with a DVT in [place], but no duplex was performed.  The patient reports cramping discomfort that wakes her from sleep at night.  She does not have cramping pain with walking.  She can walk as long as she wants.  She does not have symptoms typical of rest pain.  She has no ulcers about her feet.  She has mild swelling in her legs.  She has known mild chronic venous insufficiency.  She has no recent strokes or mini strokes.  She does have mild carotid artery stenosis.  Past Medical History:  Diagnosis Date   Allergy    Arthritis    Breast cancer (HCC) 08/24/14   right, upper inner   Cancer (HCC)    Chronic kidney disease    Depression    no meds   HSV (herpes simplex virus) anogenital infection 2018   Hypercholesterolemia    Hypertension    Hypothyroidism    Multinodular thyroid    NIDDM (non-insulin dependent diabetes mellitus)    Osteoporosis 2020   T score -2.5 stable from prior DEXA   Radiation 01/31/15-03/01/15   right  breast   Rheumatoid arthritis(714.0)    lower back   Wears  contact lenses     Past Surgical History:  Procedure Laterality Date   BREAST LUMPECTOMY Right 2016   BREAST SURGERY     Lumpectomy   CESAREAN SECTION  1610,9604   x 2   CHOLECYSTECTOMY  1993   COLONOSCOPY  last 06/15/2016   greater than 12 years but not sure where colonoscopy was performed   INCISION AND DRAINAGE ABSCESS Right 11/24/2014   Procedure: INCISION AND DRAINAGE RIGHT AXILLA  ABSCESS;  Surgeon: Claud Kelp, MD;  Location: [facility] OR;  Service: General;  Laterality: Right;   RADIOACTIVE SEED GUIDED PARTIAL MASTECTOMY WITH AXILLARY SENTINEL LYMPH NODE BIOPSY Right 10/12/2014   Procedure: RIGHT  PARTIAL MASTECTOMY WITH RADIOACTIVE SEED LOCALIZATION  RIGHT  AXILLARY SENTINEL  NODE BIOPSY;  Surgeon: Claud Kelp, MD;  Location: [facility];  Service: General;  Laterality: Right;   repair of lateral epicondylitis of right humerus  01/15/2021   THYROIDECTOMY, PARTIAL  2023   TUBAL LIGATION  1996   re annastomosis   WISDOM TOOTH EXTRACTION      Family History  Problem Relation Age of Onset   Diabetes Mother        niddm   Heart disease Mother    Heart failure Father    Heart disease Father    Hyperlipidemia Sister    Colon cancer Maternal Uncle 34   Ovarian cancer Cousin 82    Social History   Socioeconomic  History   Marital status: Married    Spouse name: Arnoldo   Number of children: 2   Years of education: College   Highest education level: Not on file  Occupational History    Comment: Self Employed  Tobacco Use   Smoking status: Never   Smokeless tobacco: Never  Vaping Use   Vaping status: Never Used  Substance and Sexual Activity   Alcohol use: Not Currently    Alcohol/week: 0.0 standard drinks of alcohol   Drug use: No   Sexual activity: Yes    Partners: Male    Birth control/protection: Post-menopausal    Comment: 1st intercourse 11 yo-5 partners  Other Topics Concern   Not on file  Social History Narrative   Patient lives at home  with her spouse. Two story home   Caffeine use: 1 cup daily   Right handed   Social Determinants of Health   Financial Resource Strain: Not on file  Food Insecurity: Not on file  Transportation Needs: Not on file  Physical Activity: Not on file  Stress: Not on file  Social Connections: Not on file  Intimate Partner Violence: Not on file    Allergies  Allergen Reactions   Shellfish Allergy Hives    unsure    Current Outpatient Medications  Medication Sig Dispense Refill   amitriptyline (ELAVIL) 25 MG tablet TAKE ONE TABLET BY MOUTH AT BEDTIME  "OFFICE VISIT NEEDED" 90 tablet 3   aspirin EC 81 MG tablet Take 81 mg by mouth daily.     Blood Glucose Monitoring Suppl (ONETOUCH VERIO FLEX SYSTEM) w/Device KIT      Calcium Carbonate-Vitamin D 600-200 MG-UNIT TABS Take 1 tablet by mouth daily.      glimepiride (AMARYL) 2 MG tablet Take 2 mg by mouth daily.     Glucosamine 500 MG CAPS Take 1 capsule by mouth daily.     ibandronate (BONIVA) 150 MG tablet Take 1 tablet (150 mg total) by mouth every 30 (thirty) days. Take in the morning with a full glass of water, on an empty stomach, and do not take anything else by mouth or lie down for the next 30 min. 3 tablet 2   levothyroxine (SYNTHROID) 25 MCG tablet Take 25 mcg by mouth every morning.     lisinopril (ZESTRIL) 2.5 MG tablet Take 2.5 mg by mouth daily.     metFORMIN (GLUCOPHAGE-XR) 500 MG 24 hr tablet TAKE TWO TABLETS BY MOUTH TWICE DAILY (Patient taking differently: Take 1,000 mg by mouth in the morning and at bedtime.) 360 tablet 0   MOUNJARO 5 MG/0.5ML Pen Inject 5 mg into the skin once a week.     Multiple Vitamin (MULTIVITAMIN) capsule Take 1 capsule by mouth daily.     Omega-3 Fatty Acids (OMEGA-3 FISH OIL PO) Take 1 capsule by mouth daily.      pioglitazone (ACTOS) 45 MG tablet Take 45 mg by mouth daily.     rosuvastatin (CRESTOR) 20 MG tablet Take 1 tablet (20 mg total) by mouth daily. 90 tablet 2   TURMERIC PO Take 1 capsule  by mouth daily.     DULoxetine (CYMBALTA) 30 MG capsule 1 capsule (Patient not taking: Reported on 07/24/2023)     No current facility-administered medications for this visit.    PHYSICAL EXAM Vitals:   07/24/23 1039  BP: 117/69  Pulse: (!) 53  Temp: 98.2 F (36.8 C)  TempSrc: Temporal  SpO2: 98%  Weight: 128 lb 1.6 oz (58.1 kg)  Height: 5\' 2"  (1.575 m)   Elderly woman in no distress Palpable dorsalis pedis pulses bilaterally Scattered reticular veins about the lower extremities bilaterally   PERTINENT LABORATORY AND RADIOLOGIC DATA  Most recent CBC    Latest Ref Rng & Units 02/16/2022   12:18 AM 07/19/2021    6:42 PM 10/06/2020    3:30 PM  CBC  WBC 4.0 - 10.5 K/uL 10.2  8.6  8.1   Hemoglobin 12.0 - 15.0 g/dL 16.1  09.6  04.5   Hematocrit 36.0 - 46.0 % 35.8  39.8  40.3   Platelets 150 - 400 K/uL 264  285  305      Most recent CMP    Latest Ref Rng & Units 02/16/2022   12:18 AM 12/12/2021    8:11 AM 07/19/2021    6:42 PM  CMP  Glucose 70 - 99 mg/dL 409   811   BUN 8 - 23 mg/dL 26   17   Creatinine 9.14 - 1.00 mg/dL 7.82   9.56   Sodium 213 - 145 mmol/L 133   138   Potassium 3.5 - 5.1 mmol/L 4.5   3.3   Chloride 98 - 111 mmol/L 104   105   CO2 22 - 32 mmol/L 17   21   Calcium 8.9 - 10.3 mg/dL 9.5   9.9   Total Protein 6.0 - 8.5 g/dL  6.4  7.5   Total Bilirubin 0.0 - 1.2 mg/dL  0.3  0.9   Alkaline Phos 44 - 121 IU/L  80  45   AST 0 - 40 IU/L  21  21   ALT 0 - 32 IU/L  18  19     Renal function CrCl cannot be calculated (Patient's most recent lab result is older than the maximum 21 days allowed.).  Hgb A1c MFr Bld (%)  Date Value  08/26/2019 7.0 (H)    LDL Chol Calc (NIH)  Date Value Ref Range Status  12/12/2021 50 0 - 99 mg/dL Final    Right Carotid: Velocities in the right ICA are consistent with a 1-39%  stenosis.   Left Carotid: There is no evidence of stenosis in the left ICA.   Vertebrals:  Bilateral vertebral arteries demonstrate antegrade flow.   Subclavians: Normal flow hemodynamics were seen in bilateral subclavian               arteries.    +-------+-----------+-----------+------------+------------+  ABI/TBIToday's ABIToday's TBIPrevious ABIPrevious TBI  +-------+-----------+-----------+------------+------------+  Right 1.12       0.61       1.24        0.95          +-------+-----------+-----------+------------+------------+  Left  1.16       0.65       1.22        0.65          +-------+-----------+-----------+------------+------------+    Rande Brunt. Lenell Antu, MD [facility] Phone Number: [phone] 07/24/2023 12:27 PM   Total time spent on preparing this encounter including chart review, data review, collecting history, examining the patient, coordinating care for this established patient, 30 minutes.  Portions of this report may have been transcribed using voice recognition software.  Every effort has been made to ensure accuracy; however, inadvertent computerized transcription errors may still be present.

## 2023-07-30 DIAGNOSIS — J019 Acute sinusitis, unspecified: Secondary | ICD-10-CM | POA: Diagnosis not present

## 2023-07-30 DIAGNOSIS — B9689 Other specified bacterial agents as the cause of diseases classified elsewhere: Secondary | ICD-10-CM | POA: Diagnosis not present

## 2023-08-20 DIAGNOSIS — J069 Acute upper respiratory infection, unspecified: Secondary | ICD-10-CM | POA: Diagnosis not present

## 2023-08-21 ENCOUNTER — Other Ambulatory Visit: Payer: Self-pay | Admitting: Family Medicine

## 2023-08-21 DIAGNOSIS — E041 Nontoxic single thyroid nodule: Secondary | ICD-10-CM

## 2023-09-09 ENCOUNTER — Ambulatory Visit
Admission: RE | Admit: 2023-09-09 | Discharge: 2023-09-09 | Disposition: A | Discharge status: Home / Self Care | Payer: Medicare Other | Source: Ambulatory Visit | Attending: Family Medicine | Admitting: Family Medicine

## 2023-09-09 DIAGNOSIS — E041 Nontoxic single thyroid nodule: Secondary | ICD-10-CM | POA: Diagnosis not present

## 2023-09-30 DIAGNOSIS — E890 Postprocedural hypothyroidism: Secondary | ICD-10-CM | POA: Diagnosis not present

## 2023-09-30 DIAGNOSIS — E1169 Type 2 diabetes mellitus with other specified complication: Secondary | ICD-10-CM | POA: Diagnosis not present

## 2023-09-30 DIAGNOSIS — E7800 Pure hypercholesterolemia, unspecified: Secondary | ICD-10-CM | POA: Diagnosis not present

## 2023-09-30 DIAGNOSIS — I700 Atherosclerosis of aorta: Secondary | ICD-10-CM | POA: Diagnosis not present

## 2023-09-30 DIAGNOSIS — Z0000 Encounter for general adult medical examination without abnormal findings: Secondary | ICD-10-CM | POA: Diagnosis not present

## 2023-09-30 DIAGNOSIS — E11319 Type 2 diabetes mellitus with unspecified diabetic retinopathy without macular edema: Secondary | ICD-10-CM | POA: Diagnosis not present

## 2023-10-02 ENCOUNTER — Other Ambulatory Visit: Payer: Self-pay | Admitting: Family Medicine

## 2023-10-02 DIAGNOSIS — M810 Age-related osteoporosis without current pathological fracture: Secondary | ICD-10-CM

## 2023-10-25 DIAGNOSIS — M26609 Unspecified temporomandibular joint disorder, unspecified side: Secondary | ICD-10-CM | POA: Diagnosis not present

## 2023-10-25 DIAGNOSIS — R070 Pain in throat: Secondary | ICD-10-CM | POA: Diagnosis not present

## 2023-10-25 DIAGNOSIS — M542 Cervicalgia: Secondary | ICD-10-CM | POA: Diagnosis not present

## 2023-10-25 DIAGNOSIS — J323 Chronic sphenoidal sinusitis: Secondary | ICD-10-CM | POA: Diagnosis not present

## 2023-10-25 DIAGNOSIS — R519 Headache, unspecified: Secondary | ICD-10-CM | POA: Diagnosis not present

## 2023-10-25 DIAGNOSIS — Z8673 Personal history of transient ischemic attack (TIA), and cerebral infarction without residual deficits: Secondary | ICD-10-CM | POA: Diagnosis not present

## 2023-10-25 DIAGNOSIS — J329 Chronic sinusitis, unspecified: Secondary | ICD-10-CM | POA: Diagnosis not present

## 2023-11-25 DIAGNOSIS — E890 Postprocedural hypothyroidism: Secondary | ICD-10-CM | POA: Diagnosis not present

## 2023-11-25 DIAGNOSIS — K219 Gastro-esophageal reflux disease without esophagitis: Secondary | ICD-10-CM | POA: Diagnosis not present

## 2023-11-25 DIAGNOSIS — E559 Vitamin D deficiency, unspecified: Secondary | ICD-10-CM | POA: Diagnosis not present

## 2023-11-25 DIAGNOSIS — E7800 Pure hypercholesterolemia, unspecified: Secondary | ICD-10-CM | POA: Diagnosis not present

## 2023-11-25 DIAGNOSIS — M810 Age-related osteoporosis without current pathological fracture: Secondary | ICD-10-CM | POA: Diagnosis not present

## 2023-11-25 DIAGNOSIS — E1165 Type 2 diabetes mellitus with hyperglycemia: Secondary | ICD-10-CM | POA: Diagnosis not present

## 2023-11-25 DIAGNOSIS — I10 Essential (primary) hypertension: Secondary | ICD-10-CM | POA: Diagnosis not present

## 2023-12-26 DIAGNOSIS — I10 Essential (primary) hypertension: Secondary | ICD-10-CM | POA: Diagnosis not present

## 2023-12-26 DIAGNOSIS — E1165 Type 2 diabetes mellitus with hyperglycemia: Secondary | ICD-10-CM | POA: Diagnosis not present

## 2023-12-26 DIAGNOSIS — E890 Postprocedural hypothyroidism: Secondary | ICD-10-CM | POA: Diagnosis not present

## 2023-12-26 DIAGNOSIS — K219 Gastro-esophageal reflux disease without esophagitis: Secondary | ICD-10-CM | POA: Diagnosis not present

## 2023-12-26 DIAGNOSIS — E7800 Pure hypercholesterolemia, unspecified: Secondary | ICD-10-CM | POA: Diagnosis not present

## 2023-12-26 DIAGNOSIS — M810 Age-related osteoporosis without current pathological fracture: Secondary | ICD-10-CM | POA: Diagnosis not present

## 2023-12-27 DIAGNOSIS — M25371 Other instability, right ankle: Secondary | ICD-10-CM | POA: Diagnosis not present

## 2024-01-21 DIAGNOSIS — E039 Hypothyroidism, unspecified: Secondary | ICD-10-CM | POA: Diagnosis not present

## 2024-01-21 DIAGNOSIS — E7800 Pure hypercholesterolemia, unspecified: Secondary | ICD-10-CM | POA: Diagnosis not present

## 2024-01-21 DIAGNOSIS — E118 Type 2 diabetes mellitus with unspecified complications: Secondary | ICD-10-CM | POA: Diagnosis not present

## 2024-01-24 DIAGNOSIS — M25571 Pain in right ankle and joints of right foot: Secondary | ICD-10-CM | POA: Diagnosis not present

## 2024-01-24 DIAGNOSIS — M25371 Other instability, right ankle: Secondary | ICD-10-CM | POA: Diagnosis not present

## 2024-01-30 DIAGNOSIS — M25371 Other instability, right ankle: Secondary | ICD-10-CM | POA: Diagnosis not present

## 2024-01-30 DIAGNOSIS — M25571 Pain in right ankle and joints of right foot: Secondary | ICD-10-CM | POA: Diagnosis not present

## 2024-02-05 DIAGNOSIS — M810 Age-related osteoporosis without current pathological fracture: Secondary | ICD-10-CM | POA: Diagnosis not present

## 2024-02-05 DIAGNOSIS — E7800 Pure hypercholesterolemia, unspecified: Secondary | ICD-10-CM | POA: Diagnosis not present

## 2024-02-05 DIAGNOSIS — Z8673 Personal history of transient ischemic attack (TIA), and cerebral infarction without residual deficits: Secondary | ICD-10-CM | POA: Diagnosis not present

## 2024-02-05 DIAGNOSIS — E11319 Type 2 diabetes mellitus with unspecified diabetic retinopathy without macular edema: Secondary | ICD-10-CM | POA: Diagnosis not present

## 2024-02-05 DIAGNOSIS — E890 Postprocedural hypothyroidism: Secondary | ICD-10-CM | POA: Diagnosis not present

## 2024-02-06 DIAGNOSIS — M25571 Pain in right ankle and joints of right foot: Secondary | ICD-10-CM | POA: Diagnosis not present

## 2024-02-06 DIAGNOSIS — M25371 Other instability, right ankle: Secondary | ICD-10-CM | POA: Diagnosis not present

## 2024-02-13 DIAGNOSIS — M25371 Other instability, right ankle: Secondary | ICD-10-CM | POA: Diagnosis not present

## 2024-02-13 DIAGNOSIS — M25571 Pain in right ankle and joints of right foot: Secondary | ICD-10-CM | POA: Diagnosis not present

## 2024-02-17 ENCOUNTER — Encounter: Payer: Medicare Other | Admitting: Obstetrics and Gynecology

## 2024-02-18 DIAGNOSIS — R509 Fever, unspecified: Secondary | ICD-10-CM | POA: Diagnosis not present

## 2024-02-18 DIAGNOSIS — J069 Acute upper respiratory infection, unspecified: Secondary | ICD-10-CM | POA: Diagnosis not present

## 2024-02-18 DIAGNOSIS — R051 Acute cough: Secondary | ICD-10-CM | POA: Diagnosis not present

## 2024-02-18 DIAGNOSIS — R0602 Shortness of breath: Secondary | ICD-10-CM | POA: Diagnosis not present

## 2024-02-24 DIAGNOSIS — J988 Other specified respiratory disorders: Secondary | ICD-10-CM | POA: Diagnosis not present

## 2024-02-24 DIAGNOSIS — R0602 Shortness of breath: Secondary | ICD-10-CM | POA: Diagnosis not present

## 2024-02-27 DIAGNOSIS — M25371 Other instability, right ankle: Secondary | ICD-10-CM | POA: Diagnosis not present

## 2024-02-27 DIAGNOSIS — M25571 Pain in right ankle and joints of right foot: Secondary | ICD-10-CM | POA: Diagnosis not present

## 2024-03-05 DIAGNOSIS — M25571 Pain in right ankle and joints of right foot: Secondary | ICD-10-CM | POA: Diagnosis not present

## 2024-03-05 DIAGNOSIS — M25371 Other instability, right ankle: Secondary | ICD-10-CM | POA: Diagnosis not present

## 2024-03-11 DIAGNOSIS — J069 Acute upper respiratory infection, unspecified: Secondary | ICD-10-CM | POA: Diagnosis not present

## 2024-03-11 DIAGNOSIS — E890 Postprocedural hypothyroidism: Secondary | ICD-10-CM | POA: Diagnosis not present

## 2024-03-11 DIAGNOSIS — J988 Other specified respiratory disorders: Secondary | ICD-10-CM | POA: Diagnosis not present

## 2024-03-11 DIAGNOSIS — E11319 Type 2 diabetes mellitus with unspecified diabetic retinopathy without macular edema: Secondary | ICD-10-CM | POA: Diagnosis not present

## 2024-03-11 DIAGNOSIS — K219 Gastro-esophageal reflux disease without esophagitis: Secondary | ICD-10-CM | POA: Diagnosis not present

## 2024-03-26 DIAGNOSIS — J9801 Acute bronchospasm: Secondary | ICD-10-CM | POA: Diagnosis not present

## 2024-05-07 ENCOUNTER — Telehealth: Payer: Self-pay | Admitting: Pharmacist

## 2024-05-07 NOTE — Telephone Encounter (Signed)
 Patient contacted to inquire about rosuvastatin  adherence following quality metrics report.  Patient reports she is on vacation and will pick up her prescription when she gets back. Encouraged medication adherence.  Total time with patient call and documentation of interaction: 4 minutes.

## 2024-05-21 ENCOUNTER — Other Ambulatory Visit: Payer: Medicare Other

## 2024-06-25 DIAGNOSIS — E890 Postprocedural hypothyroidism: Secondary | ICD-10-CM | POA: Diagnosis not present

## 2024-06-25 DIAGNOSIS — M810 Age-related osteoporosis without current pathological fracture: Secondary | ICD-10-CM | POA: Diagnosis not present

## 2024-06-25 DIAGNOSIS — E7800 Pure hypercholesterolemia, unspecified: Secondary | ICD-10-CM | POA: Diagnosis not present

## 2024-06-25 DIAGNOSIS — E559 Vitamin D deficiency, unspecified: Secondary | ICD-10-CM | POA: Diagnosis not present

## 2024-06-25 DIAGNOSIS — E1165 Type 2 diabetes mellitus with hyperglycemia: Secondary | ICD-10-CM | POA: Diagnosis not present

## 2024-06-30 ENCOUNTER — Ambulatory Visit (INDEPENDENT_AMBULATORY_CARE_PROVIDER_SITE_OTHER): Admitting: Nurse Practitioner

## 2024-06-30 ENCOUNTER — Encounter: Payer: Self-pay | Admitting: Nurse Practitioner

## 2024-06-30 ENCOUNTER — Ambulatory Visit (INDEPENDENT_AMBULATORY_CARE_PROVIDER_SITE_OTHER)

## 2024-06-30 VITALS — BP 120/76 | HR 73 | Ht 61.25 in | Wt 139.0 lb

## 2024-06-30 DIAGNOSIS — N950 Postmenopausal bleeding: Secondary | ICD-10-CM | POA: Diagnosis not present

## 2024-06-30 DIAGNOSIS — N858 Other specified noninflammatory disorders of uterus: Secondary | ICD-10-CM

## 2024-06-30 NOTE — Progress Notes (Signed)
   Acute Office Visit  Subjective:    Patient ID: Destiny Moody, female    DOB: 1951-02-01, 73 y.o.   MRN: [id number]   HPI 73 y.o. presents today for bleeding. Had one episode of a large clot with wiping 5 days ago. No other symptoms. Reports h/o hemorrhoids but does not have any problems with them. Very sure it was vaginal. Had not been sexually active prior to bleeding. Postmenopausal, not on HRT. H/O benign uterine polyps.   No LMP recorded. Patient is postmenopausal.    Review of Systems  Constitutional: Negative.   Genitourinary:  Positive for vaginal bleeding. Negative for vaginal discharge and vaginal pain.       Objective:    Physical Exam Constitutional:      Appearance: Normal appearance.  Genitourinary:    General: Normal vulva.     Vagina: Normal.     Cervix: Normal.     Uterus: Normal.      Adnexa: Right adnexa normal and left adnexa normal.     Rectum: Normal.     Comments: Atrophic changes    BP 120/76   Pulse 73   Ht 5' 1.25 (1.556 m)   Wt 139 lb (63 kg)   SpO2 99%   BMI 26.05 kg/m  Wt Readings from Last 3 Encounters:  06/30/24 139 lb (63 kg)  07/24/23 128 lb 1.6 oz (58.1 kg)  11/13/22 134 lb 9.6 oz (61.1 kg)        Destiny Moody, CMA present as chaperone.   Assessment & Plan:   Problem List Items Addressed This Visit   None Visit Diagnoses       PMB (postmenopausal bleeding)    -  Primary   Relevant Orders   US  PELVIS TRANSVAGINAL NON-OB (TV ONLY)   US  Sonohysterogram     Uterine mass       Relevant Orders   US  Sonohysterogram      Vaginal ultrasound: Retroverted uterus, atrophic in size and shape, no myometrial masses.  Peripheral calcifications noted.  Endometrium 1.7 mm.  Sliver of free fluid seen.  Area measuring 6.3 x 3.2 mm, avascular (polyp?).  Both ovaries atrophic in size with 2 residual follicles noted in LO.  No adnexal masses, no free fluid.  Plan: Ultrasound reviewed with patient.  Possible polyp seen. Thin  endometrium.  Schedule sonohysterogram.    Destiny DELENA Shutter DNP, 2:57 PM 06/30/2024

## 2024-07-02 DIAGNOSIS — E1165 Type 2 diabetes mellitus with hyperglycemia: Secondary | ICD-10-CM | POA: Diagnosis not present

## 2024-07-02 DIAGNOSIS — M810 Age-related osteoporosis without current pathological fracture: Secondary | ICD-10-CM | POA: Diagnosis not present

## 2024-07-02 DIAGNOSIS — E559 Vitamin D deficiency, unspecified: Secondary | ICD-10-CM | POA: Diagnosis not present

## 2024-07-02 DIAGNOSIS — I10 Essential (primary) hypertension: Secondary | ICD-10-CM | POA: Diagnosis not present

## 2024-07-02 DIAGNOSIS — E7800 Pure hypercholesterolemia, unspecified: Secondary | ICD-10-CM | POA: Diagnosis not present

## 2024-07-02 DIAGNOSIS — E890 Postprocedural hypothyroidism: Secondary | ICD-10-CM | POA: Diagnosis not present

## 2024-07-02 DIAGNOSIS — K219 Gastro-esophageal reflux disease without esophagitis: Secondary | ICD-10-CM | POA: Diagnosis not present

## 2024-07-07 DIAGNOSIS — E039 Hypothyroidism, unspecified: Secondary | ICD-10-CM | POA: Diagnosis not present

## 2024-07-07 DIAGNOSIS — M810 Age-related osteoporosis without current pathological fracture: Secondary | ICD-10-CM | POA: Diagnosis not present

## 2024-07-07 DIAGNOSIS — Z23 Encounter for immunization: Secondary | ICD-10-CM | POA: Diagnosis not present

## 2024-07-07 DIAGNOSIS — E7800 Pure hypercholesterolemia, unspecified: Secondary | ICD-10-CM | POA: Diagnosis not present

## 2024-07-22 ENCOUNTER — Other Ambulatory Visit: Payer: Self-pay | Admitting: Obstetrics and Gynecology

## 2024-07-22 DIAGNOSIS — N950 Postmenopausal bleeding: Secondary | ICD-10-CM

## 2024-07-22 DIAGNOSIS — N858 Other specified noninflammatory disorders of uterus: Secondary | ICD-10-CM

## 2024-07-23 ENCOUNTER — Emergency Department (HOSPITAL_COMMUNITY)

## 2024-07-23 ENCOUNTER — Other Ambulatory Visit: Payer: Self-pay

## 2024-07-23 ENCOUNTER — Encounter (HOSPITAL_COMMUNITY): Payer: Self-pay

## 2024-07-23 ENCOUNTER — Emergency Department (HOSPITAL_COMMUNITY)
Admission: EM | Admit: 2024-07-23 | Discharge: 2024-07-24 | Disposition: A | Discharge status: Home / Self Care | Source: Ambulatory Visit | Attending: Emergency Medicine | Admitting: Emergency Medicine

## 2024-07-23 DIAGNOSIS — K529 Noninfective gastroenteritis and colitis, unspecified: Secondary | ICD-10-CM | POA: Insufficient documentation

## 2024-07-23 DIAGNOSIS — E039 Hypothyroidism, unspecified: Secondary | ICD-10-CM | POA: Insufficient documentation

## 2024-07-23 DIAGNOSIS — N189 Chronic kidney disease, unspecified: Secondary | ICD-10-CM | POA: Diagnosis not present

## 2024-07-23 DIAGNOSIS — I129 Hypertensive chronic kidney disease with stage 1 through stage 4 chronic kidney disease, or unspecified chronic kidney disease: Secondary | ICD-10-CM | POA: Insufficient documentation

## 2024-07-23 DIAGNOSIS — R197 Diarrhea, unspecified: Secondary | ICD-10-CM | POA: Diagnosis present

## 2024-07-23 DIAGNOSIS — Z794 Long term (current) use of insulin: Secondary | ICD-10-CM | POA: Diagnosis not present

## 2024-07-23 DIAGNOSIS — Z7982 Long term (current) use of aspirin: Secondary | ICD-10-CM | POA: Diagnosis not present

## 2024-07-23 DIAGNOSIS — Z853 Personal history of malignant neoplasm of breast: Secondary | ICD-10-CM | POA: Insufficient documentation

## 2024-07-23 DIAGNOSIS — Z7984 Long term (current) use of oral hypoglycemic drugs: Secondary | ICD-10-CM | POA: Diagnosis not present

## 2024-07-23 LAB — COMPREHENSIVE METABOLIC PANEL WITH GFR
ALT: 28 U/L (ref 0–44)
AST: 36 U/L (ref 15–41)
Albumin: 4.1 g/dL (ref 3.5–5.0)
Alkaline Phosphatase: 55 U/L (ref 38–126)
Anion gap: 12 (ref 5–15)
BUN: 15 mg/dL (ref 8–23)
CO2: 19 mmol/L — ABNORMAL LOW (ref 22–32)
Calcium: 9 mg/dL (ref 8.9–10.3)
Chloride: 101 mmol/L (ref 98–111)
Creatinine, Ser: 0.69 mg/dL (ref 0.44–1.00)
GFR, Estimated: >60 mL/min (ref >60)
Glucose, Bld: 276 mg/dL — ABNORMAL HIGH (ref 70–99)
Potassium: 3.8 mmol/L (ref 3.5–5.1)
Sodium: 132 mmol/L — ABNORMAL LOW (ref 135–145)
Total Bilirubin: 0.6 mg/dL (ref 0.0–1.2)
Total Protein: 7.1 g/dL (ref 6.5–8.1)

## 2024-07-23 LAB — CBC WITH DIFFERENTIAL/PLATELET
Abs Immature Granulocytes: 0.02 K/uL (ref 0.00–0.07)
Basophils Absolute: 0 K/uL (ref 0.0–0.1)
Basophils Relative: 0 %
Eosinophils Absolute: 0 K/uL (ref 0.0–0.5)
Eosinophils Relative: 0 %
HCT: 39.3 % (ref 36.0–46.0)
Hemoglobin: 12.8 g/dL (ref 12.0–15.0)
Immature Granulocytes: 0 %
Lymphocytes Relative: 13 %
Lymphs Abs: 0.8 K/uL (ref 0.7–4.0)
MCH: 30.3 pg (ref 26.0–34.0)
MCHC: 32.6 g/dL (ref 30.0–36.0)
MCV: 92.9 fL (ref 80.0–100.0)
Monocytes Absolute: 0.5 K/uL (ref 0.1–1.0)
Monocytes Relative: 8 %
Neutro Abs: 5.1 K/uL (ref 1.7–7.7)
Neutrophils Relative %: 79 %
Platelets: 209 K/uL (ref 150–400)
RBC: 4.23 MIL/uL (ref 3.87–5.11)
RDW: 12.4 % (ref 11.5–15.5)
WBC: 6.4 K/uL (ref 4.0–10.5)
nRBC: 0 % (ref 0.0–0.2)

## 2024-07-23 LAB — URINALYSIS, ROUTINE W REFLEX MICROSCOPIC
Bilirubin Urine: NEGATIVE
Glucose, UA: 150 mg/dL — AB
Hgb urine dipstick: NEGATIVE
Ketones, ur: 20 mg/dL — AB
Leukocytes,Ua: NEGATIVE
Nitrite: NEGATIVE
Protein, ur: NEGATIVE mg/dL
Specific Gravity, Urine: 1.016 (ref 1.005–1.030)
pH: 5 (ref 5.0–8.0)

## 2024-07-23 LAB — CBG MONITORING, ED: Glucose-Capillary: 240 mg/dL — ABNORMAL HIGH (ref 70–99)

## 2024-07-23 LAB — SARS CORONAVIRUS 2 BY RT PCR: SARS Coronavirus 2 by RT PCR: NEGATIVE

## 2024-07-23 LAB — LIPASE, BLOOD: Lipase: 57 U/L — ABNORMAL HIGH (ref 11–51)

## 2024-07-23 MED ORDER — ONDANSETRON HCL 4 MG/2ML IJ SOLN
4.0000 mg | Freq: Once | INTRAMUSCULAR | Status: AC
  Administered 2024-07-23: 4 mg via INTRAVENOUS
  Filled 2024-07-23: qty 2

## 2024-07-23 MED ORDER — IOHEXOL 300 MG/ML  SOLN
100.0000 mL | Freq: Once | INTRAMUSCULAR | Status: AC | PRN
  Administered 2024-07-23: 100 mL via INTRAVENOUS

## 2024-07-23 MED ORDER — SODIUM CHLORIDE 0.9 % IV BOLUS
1000.0000 mL | Freq: Once | INTRAVENOUS | Status: AC
  Administered 2024-07-23: 1000 mL via INTRAVENOUS

## 2024-07-23 NOTE — ED Triage Notes (Signed)
 PT states that today her blood sugar has been all over the place, she is complaining of ABD pain, diarrhea every 15 min, and general weakness.

## 2024-07-23 NOTE — ED Provider Notes (Signed)
 [place] EMERGENCY DEPARTMENT AT [facility] Provider Note   CSN: [id number] Arrival date & time: 07/23/24  2001     Patient presents with: Abdominal Pain, Weakness, Diarrhea, and Blood Sugar Problem   Destiny Moody is a 73 y.o. female.  {Add pertinent medical, surgical, social history, OB history to YEP:67052} The history is provided by the patient and medical records.  Abdominal Pain Associated symptoms: diarrhea   Weakness Associated symptoms: abdominal pain and diarrhea   Diarrhea Associated symptoms: abdominal pain    73 year old female with history of hypothyroidism, rheumatoid arthritis, chronic kidney disease, hypertension, HSV, history of breast cancer, presenting to the ED with generally feeling poorly.  Reports symptoms began on Monday where she was having some drops in her blood sugar down into the 40s and 50s.  States Tuesday seemed a little better but Wednesday began having diarrhea and sugars were dropping again.  Episodes of diarrhea have been loose and watery.  She denies any bloody stools. Has had generalized abdominal pain, somewhat worse on left side. Has had some nausea but denies any vomiting.  Symptoms seem worse when trying to eat so she has only had half of a quesadilla today and a few bites of chicken soup.  She did not take her insulin  today since she was not eating and has been having low sugars.  Husband lives with her, he has not been having any symptoms.  She has not had any abnormal food intake, travel, or antibiotic use.  Prior to Admission medications   Medication Sig Start Date End Date Taking? Authorizing Provider  aspirin  EC 81 MG tablet Take 81 mg by mouth daily. 08/09/20   [provider]  Blood Glucose Monitoring Suppl (ONETOUCH VERIO FLEX SYSTEM) w/Device KIT  06/03/19   [provider]  Calcium  Carbonate-Vitamin D  600-200 MG-UNIT TABS Take 1 tablet by mouth daily.     [provider]  celecoxib (CELEBREX)  200 MG capsule TAKE 1 CAPSULE BY MOUTH TWICE DAILY FOR 2 WEEKS THEN AS NEEDED    [provider]  DULoxetine (CYMBALTA) 30 MG capsule 1 capsule    [provider]  glimepiride  (AMARYL ) 2 MG tablet Take 2 mg by mouth daily. 12/09/21   [provider]  Glucosamine 500 MG CAPS Take 1 capsule by mouth daily.    [provider]  HUMALOG MIX 75/25 KWIKPEN (75-25) 100 UNIT/ML KwikPen SMARTSIG:10 Unit(s) SUB-Q Twice Daily    [provider]  levothyroxine (SYNTHROID) 50 MCG tablet Take by mouth.    [provider]  lisinopril  (ZESTRIL ) 2.5 MG tablet Take 2.5 mg by mouth daily. 07/07/19   [provider]  metFORMIN  (GLUCOPHAGE -XR) 500 MG 24 hr tablet TAKE TWO TABLETS BY MOUTH TWICE DAILY Patient taking differently: Take 1,000 mg by mouth in the morning and at bedtime. 10/28/16   Stallings, Zoe A, MD  Multiple Vitamin (MULTIVITAMIN) capsule Take 1 capsule by mouth daily.    [provider]  Omega-3 Fatty Acids (OMEGA-3 FISH OIL PO) Take 1 capsule by mouth daily.     [provider]  rosuvastatin  (CRESTOR ) 20 MG tablet Take 1 tablet (20 mg total) by mouth daily. 11/13/22   Wyn Jackee VEAR Mickey., NP  TURMERIC PO Take 1 capsule by mouth daily.    [provider]  VENTOLIN HFA 108 (90 Base) MCG/ACT inhaler 1 to 2 puffs as needed Inhalation every 4 hrs; Duration: 28 days 02/25/24   [provider]    Allergies:  Shellfish allergy    Review of Systems  Gastrointestinal:  Positive for abdominal pain and diarrhea.  Neurological:  Positive for weakness.  All other systems reviewed and are negative.   Updated Vital Signs BP 133/74 (BP Location: Right Arm)   Pulse 97   Temp 98.2 F (36.8 C) (Oral)   Resp 18   Ht 5' 1 (1.549 m)   Wt 63.5 kg   SpO2 100%   BMI 26.45 kg/m   Physical Exam Vitals and nursing note reviewed.  Constitutional:      Appearance: She is well-developed.     Comments: Seems a bit weak   HENT:     Head: Normocephalic and atraumatic.  Eyes:     Conjunctiva/sclera: Conjunctivae normal.     Pupils: Pupils are equal, round, and reactive to light.  Cardiovascular:     Rate and Rhythm: Normal rate and regular rhythm.     Heart sounds: Normal heart sounds.  Pulmonary:     Effort: Pulmonary effort is normal.     Breath sounds: Normal breath sounds.  Abdominal:     General: Bowel sounds are normal.     Palpations: Abdomen is soft.     Tenderness: There is no abdominal tenderness. There is no guarding or rebound.     Comments: Soft, overall non-tender to palpation, no peritoneal signs  Musculoskeletal:        General: Normal range of motion.     Cervical back: Normal range of motion.  Skin:    General: Skin is warm and dry.  Neurological:     Mental Status: She is alert and oriented to person, place, and time.     (all labs ordered are listed, but only abnormal results are displayed) Labs Reviewed  COMPREHENSIVE METABOLIC PANEL WITH GFR - Abnormal; Notable for the following components:      Result Value   Sodium 132 (*)    CO2 19 (*)    Glucose, Bld 276 (*)    All other components within normal limits  LIPASE, BLOOD - Abnormal; Notable for the following components:   Lipase 57 (*)    All other components within normal limits  CBG MONITORING, ED - Abnormal; Notable for the following components:   Glucose-Capillary 240 (*)    All other components within normal limits  C DIFFICILE QUICK SCREEN W PCR REFLEX    GASTROINTESTINAL PANEL BY PCR, STOOL (REPLACES STOOL CULTURE)  SARS CORONAVIRUS 2 BY RT PCR  CBC WITH DIFFERENTIAL/PLATELET  URINALYSIS, ROUTINE W REFLEX MICROSCOPIC    EKG: None  Radiology: No results found.  {Document cardiac monitor, telemetry assessment procedure when appropriate:32947} Procedures   Medications Ordered in the ED  sodium chloride  0.9 % bolus 1,000 mL (has no administration in time range)  ondansetron  (ZOFRAN ) injection 4 mg (has  no administration in time range)      {Click here for ABCD2, HEART and other calculators REFRESH Note before signing:1}                              Medical Decision Making Amount and/or Complexity of Data Reviewed Labs: ordered. Radiology: ordered and independent interpretation performed. ECG/medicine tests: ordered and independent interpretation performed.  Risk Prescription drug management.   73 y.o. F here with generalized weakness, nausea, diarrhea, and sporadic blood sugars over the past several days.  No sick contacts with similar.  No travel.  No abx use.  Afebrile, non-toxic in  appearance.  She does seem a bit weak on exam but HD stable.  Abdomen soft, no elicited tenderness on exam.  Labs obtained from triage-- no leukocytosis or significant electrolyte derangement.  Glucose is 276 here, did not take her insulin  today as she has not really been eating.  Lipase 57.  Concerned she may have colitis or similar causing her profound diarrhea.  Will obtain stool studies if she is able to provide sample, will also obtain CT.  She is given IV fluids, Zofran .  Final diagnoses:  None    ED Discharge Orders     None

## 2024-07-23 NOTE — ED Notes (Signed)
 Sent blue top down to lab.

## 2024-07-24 MED ORDER — LOPERAMIDE HCL 2 MG PO CAPS: 2.0000 mg | ORAL_CAPSULE | Freq: Four times a day (QID) | ORAL | 0 refills | Status: AC | PRN

## 2024-07-24 MED ORDER — ONDANSETRON 4 MG PO TBDP: 4.0000 mg | ORAL_TABLET | Freq: Three times a day (TID) | ORAL | 0 refills | Status: AC | PRN

## 2024-07-24 NOTE — ED Notes (Signed)
 Pt given ginger ale. Pt reports no nausea or emesis.

## 2024-07-24 NOTE — Discharge Instructions (Signed)
 Your CT showed findings of enteritis.  This is likely a viral etiology. Take the prescribed medication as directed.  Make sure to stay well hydrated.   Keep a close check on your blood sugar. Follow-up with your doctor if ongoing issues. Return to the ED for new or worsening symptoms.

## 2024-07-28 ENCOUNTER — Ambulatory Visit (INDEPENDENT_AMBULATORY_CARE_PROVIDER_SITE_OTHER)

## 2024-07-28 ENCOUNTER — Other Ambulatory Visit (HOSPITAL_COMMUNITY)
Admission: RE | Admit: 2024-07-28 | Discharge: 2024-07-28 | Disposition: A | Discharge status: Home / Self Care | Source: Ambulatory Visit | Attending: Nurse Practitioner | Admitting: Nurse Practitioner

## 2024-07-28 VITALS — BP 118/76 | HR 79 | Ht 61.0 in | Wt 140.0 lb

## 2024-07-28 DIAGNOSIS — N840 Polyp of corpus uteri: Secondary | ICD-10-CM | POA: Diagnosis not present

## 2024-07-28 DIAGNOSIS — N858 Other specified noninflammatory disorders of uterus: Secondary | ICD-10-CM | POA: Insufficient documentation

## 2024-07-28 DIAGNOSIS — N950 Postmenopausal bleeding: Secondary | ICD-10-CM | POA: Diagnosis present

## 2024-07-28 MED ORDER — LIDOCAINE HCL (PF) 1 % IJ SOLN: 10.0000 mL | Freq: Once | INTRAMUSCULAR | Status: DC

## 2024-07-28 NOTE — Progress Notes (Signed)
 73 y.o. G68P0002 female with PMB, endometrial mass here for SIS. Married. Referred by T. Prentiss, NP. Past medical history is notable for history of stroke (74yr ago, no sequela, on ASA), type 2 diabetes, hypertension, hyperlipidemia, hx of right breast cancer (2016).  No LMP recorded. Patient is postmenopausal.   She reports one episode of a large clot with wiping in October. No further bleeding since.  Reports h/o hemorrhoids but does not have any problems with them. Had not been sexually active prior to bleeding.  Postmenopausal, not on HRT. H/O benign uterine polyps s/p hysteroscopy with Dr. Winfred.  No chest pain or shortness of breath. Had orthopedic surgery in 2024 without complications.  06/30/24 TVUS: Retroverted uterus, atrophic in size and shape, no myometrial masses.  Peripheral calcifications noted.  Endometrium 1.7 mm.  Sliver of free fluid seen.  Area measuring 6.3 x 3.2 mm, avascular (polyp?).     Both ovaries atrophic in size with 2 residual follicles noted in LO.     No adnexal masses, no free fluid.     Component Value Date/Time   DIAGPAP  08/10/2024 1044    - Negative for intraepithelial lesion or malignancy (NILM)   DIAGPAP  03/15/2022 1155    - Negative for intraepithelial lesion or malignancy (NILM)   HPVHIGH Negative 08/10/2024 1044   ADEQPAP  08/10/2024 1044    Satisfactory for evaluation; transformation zone component PRESENT.   ADEQPAP  03/15/2022 1155    Satisfactory for evaluation; transformation zone component PRESENT.     OB History  Gravida Para Term Preterm AB Living  2 2 0   2  SAB IAB Ectopic Multiple Live Births          # Outcome Date GA Lbr Len/2nd Weight Sex Type Anes PTL Lv  2 Para      CS-LTranv     1 Para      CS-LTranv      Past Medical History:  Diagnosis Date   Allergy    Arthritis    Breast cancer (HCC) 08/24/14   right, upper inner   Cancer (HCC)    Chronic kidney disease    Depression    no meds   HSV (herpes  simplex virus) anogenital infection 2018   Hypercholesterolemia    Hypertension    Hypothyroidism    Multinodular thyroid     NIDDM (non-insulin  dependent diabetes mellitus)    Osteoporosis 2020   T score -2.5 stable from prior DEXA   Radiation 01/31/15-03/01/15   right  breast   Rheumatoid arthritis(714.0)    lower back   Wears contact lenses    Past Surgical History:  Procedure Laterality Date   BREAST LUMPECTOMY Right 2016   BREAST SURGERY     Lumpectomy   CESAREAN SECTION  8019,8015   x 2   CHOLECYSTECTOMY  1993   COLONOSCOPY  last 06/15/2016   greater than 12 years but not sure where colonoscopy was performed   INCISION AND DRAINAGE ABSCESS Right 11/24/2014   Procedure: INCISION AND DRAINAGE RIGHT AXILLA  ABSCESS;  Surgeon: Elon Pacini, MD;  Location: [facility] OR;  Service: General;  Laterality: Right;   RADIOACTIVE SEED GUIDED PARTIAL MASTECTOMY WITH AXILLARY SENTINEL LYMPH NODE BIOPSY Right 10/12/2014   Procedure: RIGHT  PARTIAL MASTECTOMY WITH RADIOACTIVE SEED LOCALIZATION  RIGHT  AXILLARY SENTINEL  NODE BIOPSY;  Surgeon: Elon Pacini, MD;  Location: [facility];  Service: General;  Laterality: Right;   repair of lateral epicondylitis of right humerus  01/15/2021   THYROIDECTOMY, PARTIAL  2023   TUBAL LIGATION  1996   re annastomosis   WISDOM TOOTH EXTRACTION     Current Outpatient Medications on File Prior to Visit  Medication Sig Dispense Refill   aspirin  EC 81 MG tablet Take 81 mg by mouth daily.     Blood Glucose Monitoring Suppl (ONETOUCH VERIO FLEX SYSTEM) w/Device KIT      Calcium  Carbonate-Vitamin D  600-200 MG-UNIT TABS Take 1 tablet by mouth daily.      celecoxib (CELEBREX) 200 MG capsule TAKE 1 CAPSULE BY MOUTH TWICE DAILY FOR 2 WEEKS THEN AS NEEDED (Patient not taking: Reported on 08/10/2024)     DULoxetine (CYMBALTA) 30 MG capsule 1 capsule     glimepiride  (AMARYL ) 2 MG tablet Take 2 mg by mouth daily.     Glucosamine 500 MG CAPS Take 1 capsule  by mouth daily.     HUMALOG MIX 75/25 KWIKPEN (75-25) 100 UNIT/ML KwikPen SMARTSIG:10 Unit(s) SUB-Q Twice Daily     levothyroxine (SYNTHROID) 50 MCG tablet Take by mouth.     lisinopril  (ZESTRIL ) 2.5 MG tablet Take 2.5 mg by mouth daily.     loperamide  (IMODIUM ) 2 MG capsule Take 1 capsule (2 mg total) by mouth 4 (four) times daily as needed for diarrhea or loose stools. 12 capsule 0   metFORMIN  (GLUCOPHAGE -XR) 500 MG 24 hr tablet TAKE TWO TABLETS BY MOUTH TWICE DAILY 360 tablet 0   Multiple Vitamin (MULTIVITAMIN) capsule Take 1 capsule by mouth daily.     Omega-3 Fatty Acids (OMEGA-3 FISH OIL PO) Take 1 capsule by mouth daily.      ondansetron  (ZOFRAN -ODT) 4 MG disintegrating tablet Take 1 tablet (4 mg total) by mouth every 8 (eight) hours as needed. 10 tablet 0   rosuvastatin  (CRESTOR ) 20 MG tablet Take 1 tablet (20 mg total) by mouth daily. 90 tablet 2   TURMERIC PO Take 1 capsule by mouth daily.     VENTOLIN HFA 108 (90 Base) MCG/ACT inhaler 1 to 2 puffs as needed Inhalation every 4 hrs; Duration: 28 days     No current facility-administered medications on file prior to visit.   Allergies  Allergen Reactions   Shellfish Allergy Hives    unsure      PE Today's Vitals   07/28/24 1418  BP: 118/76  Pulse: 79  SpO2: 99%  Weight: 140 lb (63.5 kg)  Height: 5' 1 (1.549 m)   Body mass index is 26.45 kg/m.  Physical Exam Vitals reviewed. Exam conducted with a chaperone present.  Constitutional:      General: She is not in acute distress.    Appearance: Normal appearance.  HENT:     Head: Normocephalic and atraumatic.     Nose: Nose normal.  Eyes:     Extraocular Movements: Extraocular movements intact.     Conjunctiva/sclera: Conjunctivae normal.  Cardiovascular:     Rate and Rhythm: Normal rate and regular rhythm.     Heart sounds: No murmur heard.    No friction rub. No gallop.  Pulmonary:     Effort: Pulmonary effort is normal. No respiratory distress.     Breath  sounds: Normal breath sounds. No stridor. No wheezing, rhonchi or rales.  Genitourinary:    General: Normal vulva.     Exam position: Lithotomy position.     Vagina: Normal. No vaginal discharge.     Cervix: Normal. No cervical motion tenderness, discharge or lesion.     Uterus: Normal. Not enlarged  and not tender.      Adnexa: Right adnexa normal and left adnexa normal.  Musculoskeletal:        General: Normal range of motion.     Cervical back: Normal range of motion.  Neurological:     General: No focal deficit present.     Mental Status: She is alert.  Psychiatric:        Mood and Affect: Mood normal.        Behavior: Behavior normal.     07/28/24 SIS: Procedure - Sonohysterogram  Consent performed. Speculum placed in vagina. Sterile prep of cervix with hibiclens . RV uterus 4.7cm in length. Cannula placed inside endometrial cavity without difficulty. Speculum removed. Sterile saline injected.           6.8x4.33mm   filling defect noted. Cannula removed. No complication.   Procedure Consent was signed. Timeout was performed. Speculum inserted into the vagina, cervix visualized and was prepped with Hibiclens .  Cervical block was performed with 10cc 1% lidocaine . A single-toothed tenaculum was placed on the anterior lip of the cervix to stabilize it.  The 3 mm pipelle was introduced into the endometrial cavity without difficulty to a depth of 6cm, suction initiated and a minimal amount of tissue was obtained and sent to pathology.  The instruments were removed from the patient's vagina.  Minimal bleeding from the cervix was noted.  The patient tolerated the procedure well.     Assessment and Plan:        PMB (postmenopausal bleeding) Assessment & Plan: 6.28mm polyp on SIS Plan for hysteroscopy, dilation and curettage, polypectomy.  Discussed outpatient procedure. Reviewed that  recovery is usually 1-2 days. Risks including infections, bleeding, and damage to  surrounding organs reviewed. Recommend NPO prior to midnight and reviewed medication to take on day of surgery. Dicussed use of NSAIDS as needed for pain postoperatively.  Preop checklist: Antibiotics: none DVT ppx: SCDs Postop visit: 1 week Additional clearance: medical given comorbidities    Orders: -     US  Sonohysterogram -     Surgical pathology  Endometrial polyp -     US  Sonohysterogram -     Surgical pathology -     Ambulatory Referral For Surgery Scheduling    Vera LULLA Pa, MD

## 2024-07-30 ENCOUNTER — Ambulatory Visit: Payer: Self-pay | Admitting: Obstetrics and Gynecology

## 2024-07-30 LAB — SURGICAL PATHOLOGY

## 2024-08-04 DIAGNOSIS — N950 Postmenopausal bleeding: Secondary | ICD-10-CM | POA: Insufficient documentation

## 2024-08-04 DIAGNOSIS — N840 Polyp of corpus uteri: Secondary | ICD-10-CM | POA: Insufficient documentation

## 2024-08-04 NOTE — Assessment & Plan Note (Addendum)
 6.72mm polyp on SIS Plan for hysteroscopy, dilation and curettage, polypectomy.  Discussed outpatient procedure. Reviewed that  recovery is usually 1-2 days. Risks including infections, bleeding, and damage to surrounding organs reviewed. Recommend NPO prior to midnight and reviewed medication to take on day of surgery. Dicussed use of NSAIDS as needed for pain postoperatively.  Preop checklist: Antibiotics: none DVT ppx: SCDs Postop visit: 1 week Additional clearance: medical given comorbidities

## 2024-08-07 NOTE — Progress Notes (Signed)
 73 y.o. G2P0002 postmenopausal female with history of right breast cancer (diagnosed 2016), osteoporosis (managed with [facility]  over 10 years, now managed by Dr. Tommas, endocrine, on prolia ), endometrial polyp (planning for hysteroscopy) here for B&P exam. Married. PCP: Rolinda Millman, MD   She reports some vaginal discharge. Urine sample provided: No  Postmenopausal bleeding: none since SIS Pelvic discharge or pain: none Breast mass, nipple discharge or skin changes : none Sexually active: Yes   Last PAP:     Component Value Date/Time   DIAGPAP  03/15/2022 1155    - Negative for intraepithelial lesion or malignancy (NILM)   ADEQPAP  03/15/2022 1155    Satisfactory for evaluation; transformation zone component PRESENT.   Last mammogram: 09/28/22 Birads 1, density B Last DXA: 03/21/21; done 2025 per pt Last colonoscopy: 06/29/19 q5y  Exercising: No Smoker:No  Flowsheet Row Office Visit from 08/10/2024 in [facility]  PHQ-2 Total Score 0       GYN HISTORY:  [facility]-medicare-low riskhistory of breast cancer (71yrs since) medicare  OB History  Gravida Para Term Preterm AB Living  2 2 0   2  SAB IAB Ectopic Multiple Live Births          # Outcome Date GA Lbr Len/2nd Weight Sex Type Anes PTL Lv  2 Para      CS-LTranv     1 Para      CS-LTranv      Past Medical History:  Diagnosis Date   Allergy    Arthritis    Breast cancer (HCC) 08/24/14   right, upper inner   Cancer (HCC)    Chronic kidney disease    Depression    no meds   HSV (herpes simplex virus) anogenital infection 2018   Hypercholesterolemia    Hypertension    Hypothyroidism    Multinodular thyroid     NIDDM (non-insulin  dependent diabetes mellitus)    Osteoporosis 2020   T score -2.5 stable from prior DEXA   Radiation 01/31/15-03/01/15   right  breast   Rheumatoid arthritis(714.0)    lower back   Wears contact lenses    Past Surgical History:  Procedure Laterality Date    BREAST LUMPECTOMY Right 2016   BREAST SURGERY     Lumpectomy   CESAREAN SECTION  8019,8015   x 2   CHOLECYSTECTOMY  1993   COLONOSCOPY  last 06/15/2016   greater than 12 years but not sure where colonoscopy was performed   INCISION AND DRAINAGE ABSCESS Right 11/24/2014   Procedure: INCISION AND DRAINAGE RIGHT AXILLA  ABSCESS;  Surgeon: Elon Pacini, MD;  Location: [facility] OR;  Service: General;  Laterality: Right;   RADIOACTIVE SEED GUIDED PARTIAL MASTECTOMY WITH AXILLARY SENTINEL LYMPH NODE BIOPSY Right 10/12/2014   Procedure: RIGHT  PARTIAL MASTECTOMY WITH RADIOACTIVE SEED LOCALIZATION  RIGHT  AXILLARY SENTINEL  NODE BIOPSY;  Surgeon: Elon Pacini, MD;  Location:  [facility];  Service: General;  Laterality: Right;   repair of lateral epicondylitis of right humerus  01/15/2021   THYROIDECTOMY, PARTIAL  2023   TUBAL LIGATION  1996   re annastomosis   WISDOM TOOTH EXTRACTION     Current Outpatient Medications on File Prior to Visit  Medication Sig Dispense Refill   aspirin  EC 81 MG tablet Take 81 mg by mouth daily.     Blood Glucose Monitoring Suppl (ONETOUCH VERIO FLEX SYSTEM) w/Device KIT      Calcium  Carbonate-Vitamin D  600-200 MG-UNIT TABS Take 1 tablet  by mouth daily.      DULoxetine (CYMBALTA) 30 MG capsule 1 capsule     glimepiride  (AMARYL ) 2 MG tablet Take 2 mg by mouth daily.     Glucosamine 500 MG CAPS Take 1 capsule by mouth daily.     HUMALOG MIX 75/25 KWIKPEN (75-25) 100 UNIT/ML KwikPen SMARTSIG:10 Unit(s) SUB-Q Twice Daily     levothyroxine (SYNTHROID) 50 MCG tablet Take by mouth.     lisinopril  (ZESTRIL ) 2.5 MG tablet Take 2.5 mg by mouth daily.     loperamide  (IMODIUM ) 2 MG capsule Take 1 capsule (2 mg total) by mouth 4 (four) times daily as needed for diarrhea or loose stools. 12 capsule 0   metFORMIN  (GLUCOPHAGE -XR) 500 MG 24 hr tablet TAKE TWO TABLETS BY MOUTH TWICE DAILY 360 tablet 0   Multiple Vitamin (MULTIVITAMIN) capsule Take 1 capsule by mouth daily.      Omega-3 Fatty Acids (OMEGA-3 FISH OIL PO) Take 1 capsule by mouth daily.      ondansetron  (ZOFRAN -ODT) 4 MG disintegrating tablet Take 1 tablet (4 mg total) by mouth every 8 (eight) hours as needed. 10 tablet 0   rosuvastatin  (CRESTOR ) 20 MG tablet Take 1 tablet (20 mg total) by mouth daily. 90 tablet 2   TURMERIC PO Take 1 capsule by mouth daily.     VENTOLIN HFA 108 (90 Base) MCG/ACT inhaler 1 to 2 puffs as needed Inhalation every 4 hrs; Duration: 28 days     celecoxib (CELEBREX) 200 MG capsule TAKE 1 CAPSULE BY MOUTH TWICE DAILY FOR 2 WEEKS THEN AS NEEDED (Patient not taking: Reported on 08/10/2024)     denosumab  (PROLIA ) 60 MG/ML SOSY injection 60 mg SQ q 6 months     No current facility-administered medications on file prior to visit.   Social History   Socioeconomic History   Marital status: Married    Spouse name: Arnoldo   Number of children: 2   Years of education: College   Highest education level: Not on file  Occupational History    Comment: Self Employed  Tobacco Use   Smoking status: Never   Smokeless tobacco: Never  Vaping Use   Vaping status: Never Used  Substance and Sexual Activity   Alcohol use: Not Currently    Alcohol/week: 0.0 standard drinks of alcohol   Drug use: No   Sexual activity: Yes    Partners: Male    Birth control/protection: Post-menopausal    Comment: 1st intercourse 30 yo-5 partners  Other Topics Concern   Not on file  Social History Narrative   Patient lives at home with her spouse. Two story home   Caffeine use: 1 cup daily   Right handed   Social Drivers of Corporate Investment Banker Strain: Not on file  Food Insecurity: Not on file  Transportation Needs: Not on file  Physical Activity: Not on file  Stress: Not on file  Social Connections: Not on file  Intimate Partner Violence: Not on file   Family History  Problem Relation Age of Onset   Diabetes Mother        niddm   Heart disease Mother    Heart failure Father     Heart disease Father    Hyperlipidemia Sister    Colon cancer Maternal Uncle 43   Ovarian cancer Cousin 37   Allergies  Allergen Reactions   Shellfish Allergy Hives    unsure      PE Today's Vitals   08/10/24 1011  BP:  120/64  Pulse: 80  Temp: (!) 97.5 F (36.4 C)  TempSrc: Oral  SpO2: 99%  Weight: 140 lb (63.5 kg)  Height: 5' 2.5 (1.588 m)   Body mass index is 25.2 kg/m.  Physical Exam Vitals reviewed. Exam conducted with a chaperone present.  Constitutional:      General: She is not in acute distress.    Appearance: Normal appearance.  HENT:     Head: Normocephalic and atraumatic.     Nose: Nose normal.  Eyes:     Extraocular Movements: Extraocular movements intact.     Conjunctiva/sclera: Conjunctivae normal.  Pulmonary:     Effort: Pulmonary effort is normal.  Chest:     Chest wall: No mass or tenderness.  Breasts:    Right: Normal. No swelling, mass, nipple discharge, skin change or tenderness.     Left: Normal. No swelling, mass, nipple discharge, skin change or tenderness.       Comments: Prior lumpectomy and LND scars Abdominal:     General: There is no distension.     Palpations: Abdomen is soft.     Tenderness: There is no abdominal tenderness.  Genitourinary:    General: Normal vulva.     Exam position: Lithotomy position.     Urethra: No prolapse.     Vagina: Normal. No vaginal discharge or bleeding.     Cervix: Normal. No lesion.     Uterus: Normal. Not enlarged and not tender.      Adnexa: Right adnexa normal and left adnexa normal.  Musculoskeletal:        General: Normal range of motion.     Cervical back: Normal range of motion.  Lymphadenopathy:     Upper Body:     Right upper body: No axillary adenopathy.     Left upper body: No axillary adenopathy.     Lower Body: No right inguinal adenopathy. No left inguinal adenopathy.  Skin:    General: Skin is warm and dry.  Neurological:     General: No focal deficit present.      Mental Status: She is alert.  Psychiatric:        Mood and Affect: Mood normal.        Behavior: Behavior normal.       Assessment and Plan:        Encounter for breast and pelvic examination Assessment & Plan: Cervical cancer screening performed according to ASCCP guidelines. Encouraged annual mammogram screening Colonoscopy due, f/u with PCP DXA UTD, managed by endo, on prolia  Labs and immunizations with her primary Encouraged safe sexual practices as indicated Encouraged healthy lifestyle practices with diet and exercise For patients over 70yo, I recommend 1200mg  calcium  daily and 800IU of vitamin D  daily.    Malignant neoplasm of upper-inner quadrant of right breast in female, estrogen receptor positive (HCC) Assessment & Plan: Encouraged annual MMG Number provided for imaging center for screening MMG   PMB (postmenopausal bleeding) Assessment & Plan: Surgery scheduling in process   Cervical cancer screening -     Cytology - PAP  Vaginal discharge -     WET PREP FOR TRICH, YEAST, CLUE  Negative depression screening   Destiny LULLA Pa, MD

## 2024-08-10 ENCOUNTER — Encounter: Payer: Self-pay | Admitting: Obstetrics and Gynecology

## 2024-08-10 ENCOUNTER — Other Ambulatory Visit (HOSPITAL_COMMUNITY)
Admission: RE | Admit: 2024-08-10 | Discharge: 2024-08-10 | Disposition: A | Discharge status: Home / Self Care | Source: Ambulatory Visit | Attending: Obstetrics and Gynecology | Admitting: Obstetrics and Gynecology

## 2024-08-10 ENCOUNTER — Other Ambulatory Visit: Payer: Self-pay | Admitting: Obstetrics and Gynecology

## 2024-08-10 ENCOUNTER — Ambulatory Visit (INDEPENDENT_AMBULATORY_CARE_PROVIDER_SITE_OTHER): Admitting: Obstetrics and Gynecology

## 2024-08-10 VITALS — BP 120/64 | HR 80 | Temp 97.5°F | Ht 62.5 in | Wt 140.0 lb

## 2024-08-10 DIAGNOSIS — Z1151 Encounter for screening for human papillomavirus (HPV): Secondary | ICD-10-CM | POA: Diagnosis not present

## 2024-08-10 DIAGNOSIS — Z170 Estrogen receptor positive status [ER+]: Secondary | ICD-10-CM

## 2024-08-10 DIAGNOSIS — Z1331 Encounter for screening for depression: Secondary | ICD-10-CM

## 2024-08-10 DIAGNOSIS — Z01419 Encounter for gynecological examination (general) (routine) without abnormal findings: Secondary | ICD-10-CM | POA: Diagnosis present

## 2024-08-10 DIAGNOSIS — C50211 Malignant neoplasm of upper-inner quadrant of right female breast: Secondary | ICD-10-CM

## 2024-08-10 DIAGNOSIS — Z124 Encounter for screening for malignant neoplasm of cervix: Secondary | ICD-10-CM

## 2024-08-10 DIAGNOSIS — N950 Postmenopausal bleeding: Secondary | ICD-10-CM

## 2024-08-10 DIAGNOSIS — N898 Other specified noninflammatory disorders of vagina: Secondary | ICD-10-CM

## 2024-08-10 DIAGNOSIS — Z1231 Encounter for screening mammogram for malignant neoplasm of breast: Secondary | ICD-10-CM

## 2024-08-10 LAB — WET PREP FOR TRICH, YEAST, CLUE

## 2024-08-10 NOTE — Assessment & Plan Note (Signed)
 Surgery scheduling in process

## 2024-08-10 NOTE — Assessment & Plan Note (Signed)
 Cervical cancer screening performed according to ASCCP guidelines. Encouraged annual mammogram screening Colonoscopy due, f/u with PCP DXA UTD, managed by endo, on prolia  Labs and immunizations with her primary Encouraged safe sexual practices as indicated Encouraged healthy lifestyle practices with diet and exercise For patients over 70yo, I recommend 1200mg  calcium  daily and 800IU of vitamin D  daily.

## 2024-08-10 NOTE — Assessment & Plan Note (Signed)
 Encouraged annual MMG Number provided for imaging center for screening MMG

## 2024-08-10 NOTE — Patient Instructions (Signed)
 For patients under 50-70yo, I recommend 1200mg  calcium  daily and 600IU of vitamin D daily. For patients over 70yo, I recommend 1200mg  calcium  daily and 800IU of vitamin D daily.  Health Maintenance, Female Adopting a healthy lifestyle and getting preventive care are important in promoting health and wellness. Ask your health care provider about: The right schedule for you to have regular tests and exams. Things you can do on your own to prevent diseases and keep yourself healthy. What should I know about diet, weight, and exercise? Eat a healthy diet  Eat a diet that includes plenty of vegetables, fruits, low-fat dairy products, and lean protein. Do not eat a lot of foods that are high in solid fats, added sugars, or sodium. Maintain a healthy weight Body mass index (BMI) is used to identify weight problems. It estimates body fat based on height and weight. Your health care provider can help determine your BMI and help you achieve or maintain a healthy weight. Get regular exercise Get regular exercise. This is one of the most important things you can do for your health. Most adults should: Exercise for at least 150 minutes each week. The exercise should increase your heart rate and make you sweat (moderate-intensity exercise). Do strengthening exercises at least twice a week. This is in addition to the moderate-intensity exercise. Spend less time sitting. Even light physical activity can be beneficial. Watch cholesterol and blood lipids Have your blood tested for lipids and cholesterol at 71 years of age, then have this test every 5 years. Have your cholesterol levels checked more often if: Your lipid or cholesterol levels are high. You are older than 71 years of age. You are at high risk for heart disease. What should I know about cancer screening? Depending on your health history and family history, you may need to have cancer screening at various ages. This may include screening  for: Breast cancer. Cervical cancer. Colorectal cancer. Skin cancer. Lung cancer. What should I know about heart disease, diabetes, and high blood pressure? Blood pressure and heart disease High blood pressure causes heart disease and increases the risk of stroke. This is more likely to develop in people who have high blood pressure readings or are overweight. Have your blood pressure checked: Every 3-5 years if you are 25-57 years of age. Every year if you are 24 years old or older. Diabetes Have regular diabetes screenings. This checks your fasting blood sugar level. Have the screening done: Once every three years after age 62 if you are at a normal weight and have a low risk for diabetes. More often and at a younger age if you are overweight or have a high risk for diabetes. What should I know about preventing infection? Hepatitis B If you have a higher risk for hepatitis B, you should be screened for this virus. Talk with your health care provider to find out if you are at risk for hepatitis B infection. Hepatitis C Testing is recommended for: Everyone born from [birth year] through 1963. Anyone with known risk factors for hepatitis C. Sexually transmitted infections (STIs) Get screened for STIs, including gonorrhea and chlamydia, if: You are sexually active and are younger than 71 years of age. You are older than 71 years of age and your health care provider tells you that you are at risk for this type of infection. Your sexual activity has changed since you were last screened, and you are at increased risk for chlamydia or gonorrhea. Ask your health care provider if  you are at risk. Ask your health care provider about whether you are at high risk for HIV. Your health care provider may recommend a prescription medicine to help prevent HIV infection. If you choose to take medicine to prevent HIV, you should first get tested for HIV. You should then be tested every 3 months for as long as you  are taking the medicine. Osteoporosis and menopause Osteoporosis is a disease in which the bones lose minerals and strength with aging. This can result in bone fractures. If you are 72 years old or older, or if you are at risk for osteoporosis and fractures, ask your health care provider if you should: Be screened for bone loss. Take a calcium  or vitamin D supplement to lower your risk of fractures. Be given hormone replacement therapy (HRT) to treat symptoms of menopause. Follow these instructions at home: Alcohol use Do not drink alcohol if: Your health care provider tells you not to drink. You are pregnant, may be pregnant, or are planning to become pregnant. If you drink alcohol: Limit how much you have to: 0-1 drink a day. Know how much alcohol is in your drink. In the [place] one drink equals one 12 oz bottle of beer (355 mL), one 5 oz glass of wine (148 mL), or one 1 oz glass of hard liquor (44 mL). Lifestyle Do not use any products that contain nicotine or tobacco. These products include cigarettes, chewing tobacco, and vaping devices, such as e-cigarettes. If you need help quitting, ask your health care provider. Do not use street drugs. Do not share needles. Ask your health care provider for help if you need support or information about quitting drugs. General instructions Schedule regular health, dental, and eye exams. Stay current with your vaccines. Tell your health care provider if: You often feel depressed. You have ever been abused or do not feel safe at home. Summary Adopting a healthy lifestyle and getting preventive care are important in promoting health and wellness. Follow your health care provider's instructions about healthy diet, exercising, and getting tested or screened for diseases. Follow your health care provider's instructions on monitoring your cholesterol and blood pressure. This information is not intended to replace advice given to you by your health  care provider. Make sure you discuss any questions you have with your health care provider. Document Revised: 01/23/2021 Document Reviewed: 01/23/2021 Elsevier Patient Education  2024 [employer].

## 2024-08-11 ENCOUNTER — Ambulatory Visit: Payer: Self-pay | Admitting: Obstetrics and Gynecology

## 2024-08-12 LAB — CYTOLOGY - PAP
Comment: NEGATIVE
Diagnosis: NEGATIVE
High risk HPV: NEGATIVE

## 2024-09-08 ENCOUNTER — Ambulatory Visit

## 2024-09-22 ENCOUNTER — Ambulatory Visit
Admission: RE | Admit: 2024-09-22 | Discharge: 2024-09-22 | Disposition: A | Source: Ambulatory Visit | Attending: Obstetrics and Gynecology

## 2024-09-22 DIAGNOSIS — Z1231 Encounter for screening mammogram for malignant neoplasm of breast: Secondary | ICD-10-CM

## 2024-09-24 ENCOUNTER — Ambulatory Visit (HOSPITAL_BASED_OUTPATIENT_CLINIC_OR_DEPARTMENT_OTHER): Payer: Self-pay | Admitting: Obstetrics and Gynecology

## 2024-09-28 ENCOUNTER — Ambulatory Visit: Admitting: Obstetrics and Gynecology

## 2024-09-28 ENCOUNTER — Encounter: Payer: Self-pay | Admitting: Obstetrics and Gynecology

## 2024-09-28 VITALS — BP 116/60 | HR 77 | Temp 98.1°F | Ht 62.0 in | Wt 140.0 lb

## 2024-09-28 DIAGNOSIS — N950 Postmenopausal bleeding: Secondary | ICD-10-CM

## 2024-09-28 DIAGNOSIS — N840 Polyp of corpus uteri: Secondary | ICD-10-CM

## 2024-09-28 NOTE — H&P (View-Only) (Signed)
 "  74 y.o. G4P0002 female with PMB, endometrial mass here for preop exam. Married. Referred by T. Prentiss, NP.  Past medical history is notable for history of stroke (80yr ago, no sequela, on ASA), type 2 diabetes, hypertension, hyperlipidemia, hx of right breast cancer (2016), osteoporosis.  She is scheduled for dx hyst, D&C, polypectomy 10/21/24. Doing well.  She reports one episode of a large clot with wiping in October. No further bleeding since.  H/O benign uterine polyps s/p hysteroscopy with Dr. Winfred.   No chest pain or shortness of breath. Had orthopedic surgery in 2024 without complications.    Past Medical History:  Diagnosis Date   Allergy    Arthritis    Breast cancer (HCC) 08/24/14   right, upper inner   Cancer (HCC)    Chronic kidney disease    Depression    no meds   HSV (herpes simplex virus) anogenital infection 2018   Hypercholesterolemia    Hypertension    Hypothyroidism    Multinodular thyroid     NIDDM (non-insulin  dependent diabetes mellitus)    Osteoporosis 2020   T score -2.5 stable from prior DEXA   Radiation 01/31/15-03/01/15   right  breast   Rheumatoid arthritis(714.0)    lower back   Wears contact lenses    Past Surgical History:  Procedure Laterality Date   BREAST LUMPECTOMY Right 2016   BREAST SURGERY     Lumpectomy   CESAREAN SECTION  8019,8015   x 2   CHOLECYSTECTOMY  1993   COLONOSCOPY  last 06/15/2016   greater than 12 years but not sure where colonoscopy was performed   INCISION AND DRAINAGE ABSCESS Right 11/24/2014   Procedure: INCISION AND DRAINAGE RIGHT AXILLA  ABSCESS;  Surgeon: Elon Pacini, MD;  Location: [facility];  Service: General;  Laterality: Right;   RADIOACTIVE SEED GUIDED PARTIAL MASTECTOMY WITH AXILLARY SENTINEL LYMPH NODE BIOPSY Right 10/12/2014   Procedure: RIGHT  PARTIAL MASTECTOMY WITH RADIOACTIVE SEED LOCALIZATION  RIGHT  AXILLARY SENTINEL  NODE BIOPSY;  Surgeon: Elon Pacini, MD;  Location: [facility];  Service: General;  Laterality: Right;   repair of lateral epicondylitis of right humerus  01/15/2021   THYROIDECTOMY, PARTIAL  2023   TUBAL LIGATION  1996   re annastomosis   WISDOM TOOTH EXTRACTION     Medications Ordered Prior to Encounter[1] Allergies[2]    PE Today's Vitals   09/28/24 1453  BP: 116/60  Pulse: 77  Temp: 98.1 F (36.7 C)  TempSrc: Oral  SpO2: 99%  Weight: 140 lb (63.5 kg)  Height: 5' 2 (1.575 m)   Body mass index is 25.61 kg/m.  Physical Exam Vitals reviewed.  Constitutional:      General: She is not in acute distress.    Appearance: Normal appearance.  HENT:     Head: Normocephalic and atraumatic.     Nose: Nose normal.  Eyes:     Extraocular Movements: Extraocular movements intact.     Conjunctiva/sclera: Conjunctivae normal.  Cardiovascular:     Rate and Rhythm: Normal rate and regular rhythm.     Heart sounds: No murmur heard.    No friction rub. No gallop.  Pulmonary:     Effort: Pulmonary effort is normal. No respiratory distress.     Breath sounds: Normal breath sounds. No stridor. No wheezing, rhonchi or rales.  Musculoskeletal:        General: Normal range of motion.     Cervical back: Normal range of motion.  Neurological:     General: No focal deficit present.     Mental Status: She is alert.  Psychiatric:        Mood and Affect: Mood normal.        Behavior: Behavior normal.     06/30/24 TVUS: Retroverted uterus, atrophic in size and shape (4.9 x 4 x 3.1cm)  no myometrial masses.  Peripheral calcifications noted.  Endometrium 1.7 mm.  Sliver of free fluid seen.  Area measuring 6.3 x 3.2 mm, avascular (polyp?).     Both ovaries atrophic in size with 2 residual follicles noted in LO.     No adnexal masses, no free fluid.  07/28/24 path: ENDOMETRIUM, BIOPSY:       Benign endometrial polyp.       Negative for hyperplasia, atypia or malignancy    Assessment and Plan:        PMB (postmenopausal  bleeding) Endometrial polyp Assessment & Plan: 6.73mm polyp on SIS, benign polyp on EMB Plan for hysteroscopy, dilation and curettage, polypectomy.  Discussed outpatient procedure. Reviewed that  recovery is usually 1-2 days. Risks including infections, bleeding, and damage to surrounding organs reviewed. Recommend NPO prior to midnight and reviewed medication to take on day of surgery. Dicussed use of NSAIDS as needed for pain postoperatively.  Preop checklist: Antibiotics: none DVT ppx: SCDs Postop visit: 1 week Additional clearance: Cleared for surgery 09/02/24, per note: A1c 7.6, CBC and CMP wnl, EKG stable from 2024  20 min  total time was spent for this patient encounter, including preparation, face-to-face counseling with the patient, coordination of care, and documentation of the encounter.     Destiny LULLA Pa, MD      [1]  Current Outpatient Medications on File Prior to Visit  Medication Sig Dispense Refill   aspirin  EC 81 MG tablet Take 81 mg by mouth daily.     Blood Glucose Monitoring Suppl (ONETOUCH VERIO FLEX SYSTEM) w/Device KIT      Calcium  Carbonate-Vitamin D  600-200 MG-UNIT TABS Take 1 tablet by mouth daily.      celecoxib (CELEBREX) 200 MG capsule TAKE 1 CAPSULE BY MOUTH TWICE DAILY FOR 2 WEEKS THEN AS NEEDED     Continuous Glucose Sensor (FREESTYLE LIBRE 3 PLUS SENSOR) MISC USE EVERY 15 DAYS     denosumab  (PROLIA ) 60 MG/ML SOSY injection 60 mg SQ q 6 months     DULoxetine (CYMBALTA) 30 MG capsule 1 capsule     glimepiride  (AMARYL ) 2 MG tablet Take 2 mg by mouth daily.     Glucosamine 500 MG CAPS Take 1 capsule by mouth daily.     HUMALOG MIX 75/25 KWIKPEN (75-25) 100 UNIT/ML KwikPen SMARTSIG:10 Unit(s) SUB-Q Twice Daily     levothyroxine (SYNTHROID) 50 MCG tablet Take by mouth.     lisinopril  (ZESTRIL ) 2.5 MG tablet Take 2.5 mg by mouth daily.     loperamide  (IMODIUM ) 2 MG capsule Take 1 capsule (2 mg total) by mouth 4 (four) times daily as needed for diarrhea  or loose stools. 12 capsule 0   metFORMIN  (GLUCOPHAGE -XR) 500 MG 24 hr tablet TAKE TWO TABLETS BY MOUTH TWICE DAILY 360 tablet 0   Multiple Vitamin (MULTIVITAMIN) capsule Take 1 capsule by mouth daily.     Omega-3 Fatty Acids (OMEGA-3 FISH OIL PO) Take 1 capsule by mouth daily.      ondansetron  (ZOFRAN -ODT) 4 MG disintegrating tablet Take 1 tablet (4 mg total) by mouth every 8 (eight) hours as needed. 10 tablet 0  rosuvastatin  (CRESTOR ) 20 MG tablet Take 1 tablet (20 mg total) by mouth daily. 90 tablet 2   SODIUM FLUORIDE 5000 PPM 1.1 % PSTE SMARTSIG:sparingly Twice Daily     TURMERIC PO Take 1 capsule by mouth daily.     VENTOLIN HFA 108 (90 Base) MCG/ACT inhaler 1 to 2 puffs as needed Inhalation every 4 hrs; Duration: 28 days     No current facility-administered medications on file prior to visit.  [2]  Allergies Allergen Reactions   Shellfish Allergy Hives    unsure   "

## 2024-09-28 NOTE — Progress Notes (Signed)
 "  74 y.o. G4P0002 female with PMB, endometrial mass here for preop exam. Married. Referred by T. Prentiss, NP.  Past medical history is notable for history of stroke (80yr ago, no sequela, on ASA), type 2 diabetes, hypertension, hyperlipidemia, hx of right breast cancer (2016), osteoporosis.  She is scheduled for dx hyst, D&C, polypectomy 10/21/24. Doing well.  She reports one episode of a large clot with wiping in October. No further bleeding since.  H/O benign uterine polyps s/p hysteroscopy with Dr. Winfred.   No chest pain or shortness of breath. Had orthopedic surgery in 2024 without complications.    Past Medical History:  Diagnosis Date   Allergy    Arthritis    Breast cancer (HCC) 08/24/14   right, upper inner   Cancer (HCC)    Chronic kidney disease    Depression    no meds   HSV (herpes simplex virus) anogenital infection 2018   Hypercholesterolemia    Hypertension    Hypothyroidism    Multinodular thyroid     NIDDM (non-insulin  dependent diabetes mellitus)    Osteoporosis 2020   T score -2.5 stable from prior DEXA   Radiation 01/31/15-03/01/15   right  breast   Rheumatoid arthritis(714.0)    lower back   Wears contact lenses    Past Surgical History:  Procedure Laterality Date   BREAST LUMPECTOMY Right 2016   BREAST SURGERY     Lumpectomy   CESAREAN SECTION  8019,8015   x 2   CHOLECYSTECTOMY  1993   COLONOSCOPY  last 06/15/2016   greater than 12 years but not sure where colonoscopy was performed   INCISION AND DRAINAGE ABSCESS Right 11/24/2014   Procedure: INCISION AND DRAINAGE RIGHT AXILLA  ABSCESS;  Surgeon: Elon Pacini, MD;  Location: [facility];  Service: General;  Laterality: Right;   RADIOACTIVE SEED GUIDED PARTIAL MASTECTOMY WITH AXILLARY SENTINEL LYMPH NODE BIOPSY Right 10/12/2014   Procedure: RIGHT  PARTIAL MASTECTOMY WITH RADIOACTIVE SEED LOCALIZATION  RIGHT  AXILLARY SENTINEL  NODE BIOPSY;  Surgeon: Elon Pacini, MD;  Location: [facility];  Service: General;  Laterality: Right;   repair of lateral epicondylitis of right humerus  01/15/2021   THYROIDECTOMY, PARTIAL  2023   TUBAL LIGATION  1996   re annastomosis   WISDOM TOOTH EXTRACTION     Medications Ordered Prior to Encounter[1] Allergies[2]    PE Today's Vitals   09/28/24 1453  BP: 116/60  Pulse: 77  Temp: 98.1 F (36.7 C)  TempSrc: Oral  SpO2: 99%  Weight: 140 lb (63.5 kg)  Height: 5' 2 (1.575 m)   Body mass index is 25.61 kg/m.  Physical Exam Vitals reviewed.  Constitutional:      General: She is not in acute distress.    Appearance: Normal appearance.  HENT:     Head: Normocephalic and atraumatic.     Nose: Nose normal.  Eyes:     Extraocular Movements: Extraocular movements intact.     Conjunctiva/sclera: Conjunctivae normal.  Cardiovascular:     Rate and Rhythm: Normal rate and regular rhythm.     Heart sounds: No murmur heard.    No friction rub. No gallop.  Pulmonary:     Effort: Pulmonary effort is normal. No respiratory distress.     Breath sounds: Normal breath sounds. No stridor. No wheezing, rhonchi or rales.  Musculoskeletal:        General: Normal range of motion.     Cervical back: Normal range of motion.  Neurological:     General: No focal deficit present.     Mental Status: She is alert.  Psychiatric:        Mood and Affect: Mood normal.        Behavior: Behavior normal.     06/30/24 TVUS: Retroverted uterus, atrophic in size and shape (4.9 x 4 x 3.1cm)  no myometrial masses.  Peripheral calcifications noted.  Endometrium 1.7 mm.  Sliver of free fluid seen.  Area measuring 6.3 x 3.2 mm, avascular (polyp?).     Both ovaries atrophic in size with 2 residual follicles noted in LO.     No adnexal masses, no free fluid.  07/28/24 path: ENDOMETRIUM, BIOPSY:       Benign endometrial polyp.       Negative for hyperplasia, atypia or malignancy    Assessment and Plan:        PMB (postmenopausal  bleeding) Endometrial polyp Assessment & Plan: 6.73mm polyp on SIS, benign polyp on EMB Plan for hysteroscopy, dilation and curettage, polypectomy.  Discussed outpatient procedure. Reviewed that  recovery is usually 1-2 days. Risks including infections, bleeding, and damage to surrounding organs reviewed. Recommend NPO prior to midnight and reviewed medication to take on day of surgery. Dicussed use of NSAIDS as needed for pain postoperatively.  Preop checklist: Antibiotics: none DVT ppx: SCDs Postop visit: 1 week Additional clearance: Cleared for surgery 09/02/24, per note: A1c 7.6, CBC and CMP wnl, EKG stable from 2024  20 min  total time was spent for this patient encounter, including preparation, face-to-face counseling with the patient, coordination of care, and documentation of the encounter.     Vera LULLA Pa, MD      [1]  Current Outpatient Medications on File Prior to Visit  Medication Sig Dispense Refill   aspirin  EC 81 MG tablet Take 81 mg by mouth daily.     Blood Glucose Monitoring Suppl (ONETOUCH VERIO FLEX SYSTEM) w/Device KIT      Calcium  Carbonate-Vitamin D  600-200 MG-UNIT TABS Take 1 tablet by mouth daily.      celecoxib (CELEBREX) 200 MG capsule TAKE 1 CAPSULE BY MOUTH TWICE DAILY FOR 2 WEEKS THEN AS NEEDED     Continuous Glucose Sensor (FREESTYLE LIBRE 3 PLUS SENSOR) MISC USE EVERY 15 DAYS     denosumab  (PROLIA ) 60 MG/ML SOSY injection 60 mg SQ q 6 months     DULoxetine (CYMBALTA) 30 MG capsule 1 capsule     glimepiride  (AMARYL ) 2 MG tablet Take 2 mg by mouth daily.     Glucosamine 500 MG CAPS Take 1 capsule by mouth daily.     HUMALOG MIX 75/25 KWIKPEN (75-25) 100 UNIT/ML KwikPen SMARTSIG:10 Unit(s) SUB-Q Twice Daily     levothyroxine (SYNTHROID) 50 MCG tablet Take by mouth.     lisinopril  (ZESTRIL ) 2.5 MG tablet Take 2.5 mg by mouth daily.     loperamide  (IMODIUM ) 2 MG capsule Take 1 capsule (2 mg total) by mouth 4 (four) times daily as needed for diarrhea  or loose stools. 12 capsule 0   metFORMIN  (GLUCOPHAGE -XR) 500 MG 24 hr tablet TAKE TWO TABLETS BY MOUTH TWICE DAILY 360 tablet 0   Multiple Vitamin (MULTIVITAMIN) capsule Take 1 capsule by mouth daily.     Omega-3 Fatty Acids (OMEGA-3 FISH OIL PO) Take 1 capsule by mouth daily.      ondansetron  (ZOFRAN -ODT) 4 MG disintegrating tablet Take 1 tablet (4 mg total) by mouth every 8 (eight) hours as needed. 10 tablet 0  rosuvastatin  (CRESTOR ) 20 MG tablet Take 1 tablet (20 mg total) by mouth daily. 90 tablet 2   SODIUM FLUORIDE 5000 PPM 1.1 % PSTE SMARTSIG:sparingly Twice Daily     TURMERIC PO Take 1 capsule by mouth daily.     VENTOLIN HFA 108 (90 Base) MCG/ACT inhaler 1 to 2 puffs as needed Inhalation every 4 hrs; Duration: 28 days     No current facility-administered medications on file prior to visit.  [2]  Allergies Allergen Reactions   Shellfish Allergy Hives    unsure   "

## 2024-09-28 NOTE — Assessment & Plan Note (Signed)
 6.29mm polyp on SIS, benign polyp on EMB Plan for hysteroscopy, dilation and curettage, polypectomy.  Discussed outpatient procedure. Reviewed that  recovery is usually 1-2 days. Risks including infections, bleeding, and damage to surrounding organs reviewed. Recommend NPO prior to midnight and reviewed medication to take on day of surgery. Dicussed use of NSAIDS as needed for pain postoperatively.  Preop checklist: Antibiotics: none DVT ppx: SCDs Postop visit: 1 week Additional clearance: Cleared for surgery 09/02/24, per note: A1c 7.6, CBC and CMP wnl, EKG stable from 2024

## 2024-09-28 NOTE — Patient Instructions (Addendum)
 Preoperative instructions: Nothing to eat or drink after midnight, unless instructed differently regarding clear liquids by the anesthesia team at [facility] health. Do not take any medications on the day of surgery, except those listed below: Lisinopril  synthroid  Take half of your bedtime humalog the night before surgery. Please follow all other instructions as provided by our surgical scheduler at [facility] and the anesthesia team at [facility].  Postoperative instructions: Take ibuprofen  as prescribed and over-the-counter Tylenol  as needed. (DO NOT TAKE IBUPROFEN  IF YOU HAVE CONTRAINDICATIONS THAT MAY INCLUDE USE OF BLOOD THINNERS, HISTORY STOMACH SURGERY OR GASTRITIS, CHRONIC KIDNEY DISEASE, ALLERGY, PRIOR INSTRUCTION TO AVOID IBUPROFEN -LIKE MEDICATIONS.)

## 2024-10-13 ENCOUNTER — Encounter: Payer: Self-pay | Admitting: *Deleted

## 2024-10-20 ENCOUNTER — Encounter (HOSPITAL_COMMUNITY): Payer: Self-pay | Admitting: Obstetrics and Gynecology

## 2024-10-20 NOTE — Progress Notes (Addendum)
 Spoke w/ via phone for pre-op interview--- [facility] needs dos---- BMP and CBG per anesthesia.         Lab results------ Current EKG dated 11/14/23. Current A1C 7.6 dated 09/02/25. COVID test -----patient states asymptomatic no test needed Arrive at -------1245 NPO after MN NO Solid Food.  Clear liquids from MN until---1145 Pre-Surgery Ensure or G2:  Med rec completed Medications to take morning of surgery -----Levothyroxine and Cymbalta. Diabetic medication -----1/2 insulin  dose PM before surgery. No DM meds AM of surgery.  GLP1 agonist last dose: GLP1 instructions:  Patient instructed no nail polish to be worn day of surgery Patient instructed to bring photo id and insurance card day of surgery Patient aware to have Driver (ride ) / caregiver    for 24 hours after surgery - Husband Lawernce Church Patient Special Instructions ----- Pre-Op special Instructions -----NS IV fluid. Pt has history of CKD.  Patient verbalized understanding of instructions that were given at this phone interview. Patient denies chest pain, sob, fever, cough at the interview.

## 2024-10-21 ENCOUNTER — Encounter (HOSPITAL_COMMUNITY): Payer: Self-pay | Admitting: Obstetrics and Gynecology

## 2024-10-21 ENCOUNTER — Encounter (HOSPITAL_COMMUNITY): Admitting: Anesthesiology

## 2024-10-21 ENCOUNTER — Encounter (HOSPITAL_COMMUNITY)
Admission: RE | Disposition: A | Discharge status: Home / Self Care | Payer: Self-pay | Source: Home / Self Care | Attending: Obstetrics and Gynecology

## 2024-10-21 ENCOUNTER — Ambulatory Visit (HOSPITAL_COMMUNITY)
Admission: RE | Admit: 2024-10-21 | Discharge: 2024-10-21 | Disposition: A | Discharge status: Home / Self Care | Source: Home / Self Care | Attending: Obstetrics and Gynecology | Admitting: Obstetrics and Gynecology

## 2024-10-21 ENCOUNTER — Other Ambulatory Visit: Payer: Self-pay

## 2024-10-21 DIAGNOSIS — N950 Postmenopausal bleeding: Secondary | ICD-10-CM | POA: Diagnosis not present

## 2024-10-21 DIAGNOSIS — N840 Polyp of corpus uteri: Secondary | ICD-10-CM | POA: Diagnosis present

## 2024-10-21 DIAGNOSIS — E119 Type 2 diabetes mellitus without complications: Secondary | ICD-10-CM

## 2024-10-21 DIAGNOSIS — I158 Other secondary hypertension: Secondary | ICD-10-CM

## 2024-10-21 LAB — BASIC METABOLIC PANEL WITH GFR
Anion gap: 13 (ref 5–15)
BUN: 15 mg/dL (ref 8–23)
CO2: 21 mmol/L — ABNORMAL LOW (ref 22–32)
Calcium: 9.1 mg/dL (ref 8.9–10.3)
Chloride: 104 mmol/L (ref 98–111)
Creatinine, Ser: 0.59 mg/dL (ref 0.44–1.00)
GFR, Estimated: >60 mL/min
Glucose, Bld: 186 mg/dL — ABNORMAL HIGH (ref 70–99)
Potassium: 3.9 mmol/L (ref 3.5–5.1)
Sodium: 138 mmol/L (ref 135–145)

## 2024-10-21 LAB — GLUCOSE, CAPILLARY
Glucose-Capillary: 177 mg/dL — ABNORMAL HIGH (ref 70–99)
Glucose-Capillary: 125 mg/dL — ABNORMAL HIGH (ref 70–99)
Glucose-Capillary: 149 mg/dL — ABNORMAL HIGH (ref 70–99)

## 2024-10-21 MED ORDER — AMISULPRIDE (ANTIEMETIC) 5 MG/2ML IV SOLN
INTRAVENOUS | Status: AC
  Filled 2024-10-21: qty 4

## 2024-10-21 MED ORDER — SODIUM CHLORIDE 0.9 % IV SOLN: INTRAVENOUS | Status: DC

## 2024-10-21 MED ORDER — OXYCODONE HCL 5 MG/5ML PO SOLN: 5.0000 mg | Freq: Once | ORAL | Status: DC | PRN

## 2024-10-21 MED ORDER — PROPOFOL 10 MG/ML IV BOLUS
INTRAVENOUS | Status: AC
  Filled 2024-10-21: qty 20

## 2024-10-21 MED ORDER — CHLORHEXIDINE GLUCONATE 0.12 % MT SOLN
15.0000 mL | Freq: Once | OROMUCOSAL | Status: AC
  Administered 2024-10-21: 15 mL via OROMUCOSAL

## 2024-10-21 MED ORDER — FENTANYL CITRATE (PF) 100 MCG/2ML IJ SOLN: 25.0000 ug | INTRAMUSCULAR | Status: DC | PRN

## 2024-10-21 MED ORDER — SODIUM CHLORIDE 0.9 % IR SOLN
Status: DC | PRN
  Administered 2024-10-21: 1795 mL

## 2024-10-21 MED ORDER — AMISULPRIDE (ANTIEMETIC) 5 MG/2ML IV SOLN
10.0000 mg | Freq: Once | INTRAVENOUS | Status: AC | PRN
  Administered 2024-10-21: 10 mg via INTRAVENOUS

## 2024-10-21 MED ORDER — ONDANSETRON HCL 4 MG/2ML IJ SOLN
INTRAMUSCULAR | Status: AC
  Filled 2024-10-21: qty 2

## 2024-10-21 MED ORDER — ACETAMINOPHEN 500 MG PO TABS
1000.0000 mg | ORAL_TABLET | ORAL | Status: AC
  Administered 2024-10-21: 1000 mg via ORAL

## 2024-10-21 MED ORDER — OXYCODONE HCL 5 MG PO TABS: 5.0000 mg | ORAL_TABLET | Freq: Once | ORAL | Status: DC | PRN

## 2024-10-21 MED ORDER — PROPOFOL 10 MG/ML IV BOLUS
INTRAVENOUS | Status: DC | PRN
  Administered 2024-10-21: 150 mg via INTRAVENOUS

## 2024-10-21 MED ORDER — DEXAMETHASONE SOD PHOSPHATE PF 10 MG/ML IJ SOLN
INTRAMUSCULAR | Status: AC
  Filled 2024-10-21: qty 1

## 2024-10-21 MED ORDER — MIDAZOLAM HCL 2 MG/2ML IJ SOLN
INTRAMUSCULAR | Status: AC
  Filled 2024-10-21: qty 2

## 2024-10-21 MED ORDER — LIDOCAINE 2% (20 MG/ML) 5 ML SYRINGE
INTRAMUSCULAR | Status: DC | PRN
  Administered 2024-10-21: 60 mg via INTRAVENOUS

## 2024-10-21 MED ORDER — ORAL CARE MOUTH RINSE: 15.0000 mL | Freq: Once | OROMUCOSAL | Status: AC

## 2024-10-21 MED ORDER — ONDANSETRON HCL 4 MG/2ML IJ SOLN
INTRAMUSCULAR | Status: DC | PRN
  Administered 2024-10-21: 4 mg via INTRAVENOUS

## 2024-10-21 MED ORDER — LIDOCAINE HCL (PF) 1 % IJ SOLN
INTRAMUSCULAR | Status: DC | PRN
  Administered 2024-10-21: 10 mL

## 2024-10-21 MED ORDER — LACTATED RINGERS IV SOLN: INTRAVENOUS | Status: DC

## 2024-10-21 MED ORDER — FENTANYL CITRATE (PF) 250 MCG/5ML IJ SOLN
INTRAMUSCULAR | Status: DC | PRN
  Administered 2024-10-21: 50 ug via INTRAVENOUS
  Administered 2024-10-21: 25 ug via INTRAVENOUS

## 2024-10-21 MED ORDER — PHENYLEPHRINE 80 MCG/ML (10ML) SYRINGE FOR IV PUSH (FOR BLOOD PRESSURE SUPPORT)
PREFILLED_SYRINGE | INTRAVENOUS | Status: AC
  Filled 2024-10-21: qty 10

## 2024-10-21 MED ORDER — PHENYLEPHRINE 80 MCG/ML (10ML) SYRINGE FOR IV PUSH (FOR BLOOD PRESSURE SUPPORT)
PREFILLED_SYRINGE | INTRAVENOUS | Status: DC | PRN
  Administered 2024-10-21 (×2): 80 ug via INTRAVENOUS

## 2024-10-21 MED ORDER — INSULIN ASPART 100 UNIT/ML IJ SOLN: 0.0000 [IU] | INTRAMUSCULAR | Status: DC | PRN

## 2024-10-21 MED ORDER — FENTANYL CITRATE (PF) 100 MCG/2ML IJ SOLN
INTRAMUSCULAR | Status: AC
  Filled 2024-10-21: qty 2

## 2024-10-21 MED ORDER — LIDOCAINE 2% (20 MG/ML) 5 ML SYRINGE
INTRAMUSCULAR | Status: AC
  Filled 2024-10-21: qty 5

## 2024-10-21 MED ORDER — MIDAZOLAM HCL (PF) 2 MG/2ML IJ SOLN
INTRAMUSCULAR | Status: DC | PRN
  Administered 2024-10-21: 1 mg via INTRAVENOUS

## 2024-10-21 MED ORDER — CHLORHEXIDINE GLUCONATE 0.12 % MT SOLN
OROMUCOSAL | Status: AC
  Filled 2024-10-21: qty 15

## 2024-10-21 MED ORDER — DEXAMETHASONE SOD PHOSPHATE PF 10 MG/ML IJ SOLN
INTRAMUSCULAR | Status: DC | PRN
  Administered 2024-10-21: 5 mg via INTRAVENOUS

## 2024-10-21 MED ORDER — ACETAMINOPHEN 500 MG PO TABS
ORAL_TABLET | ORAL | Status: AC
  Filled 2024-10-21: qty 2

## 2024-10-21 MED ORDER — ACETAMINOPHEN 500 MG PO TABS: 1000.0000 mg | ORAL_TABLET | Freq: Once | ORAL | Status: DC

## 2024-10-21 MED ORDER — ONDANSETRON HCL 4 MG/2ML IJ SOLN
INTRAMUSCULAR | Status: AC
  Filled 2024-10-21: qty 4

## 2024-10-21 NOTE — Anesthesia Preprocedure Evaluation (Addendum)
"                                    Anesthesia Evaluation  Patient identified by MRN, date of birth, ID band Patient awake    Reviewed: Allergy & Precautions, NPO status , Patient's Chart, lab work & pertinent test results  Airway Mallampati: I  TM Distance: >3 FB Neck ROM: Full    Dental no notable dental hx. (+) Teeth Intact, Dental Advisory Given   Pulmonary neg pulmonary ROS   Pulmonary exam normal breath sounds clear to auscultation       Cardiovascular hypertension, Pt. on medications Normal cardiovascular exam Rhythm:Regular Rate:Normal  TTE 2022  1. Left ventricular ejection fraction, by estimation, is 60 to 65%. The  left ventricle has normal function. The left ventricle has no regional  wall motion abnormalities. Left ventricular diastolic parameters are  consistent with Grade I diastolic  dysfunction (impaired relaxation). The average left ventricular global  longitudinal strain is -19.1 %. The global longitudinal strain is normal.   2. Right ventricular systolic function is normal. The right ventricular  size is normal.   3. The mitral valve is normal in structure. No evidence of mitral valve  regurgitation. No evidence of mitral stenosis.   4. The aortic valve is normal in structure. Aortic valve regurgitation is  not visualized. No aortic stenosis is present.   5. The inferior vena cava is normal in size with greater than 50%  respiratory variability, suggesting right atrial pressure of 3 mmHg.     Neuro/Psych  Headaches PSYCHIATRIC DISORDERS Anxiety Depression       GI/Hepatic Neg liver ROS,GERD  ,,  Endo/Other  diabetes, Type 2, Oral Hypoglycemic Agents, Insulin  DependentHypothyroidism    Renal/GU Renal InsufficiencyRenal disease  negative genitourinary   Musculoskeletal  (+) Arthritis , Rheumatoid disorders,    Abdominal   Peds  Hematology negative hematology ROS (+)   Anesthesia Other Findings   Reproductive/Obstetrics                               Anesthesia Physical Anesthesia Plan  ASA: 3  Anesthesia Plan: General   Post-op Pain Management: Tylenol  PO (pre-op)*   Induction: Intravenous  PONV Risk Score and Plan: 3 and Ondansetron , Dexamethasone  and Treatment may vary due to age or medical condition  Airway Management Planned: LMA  Additional Equipment:   Intra-op Plan:   Post-operative Plan: Extubation in OR  Informed Consent: I have reviewed the patients History and Physical, chart, labs and discussed the procedure including the risks, benefits and alternatives for the proposed anesthesia with the patient or authorized representative who has indicated his/her understanding and acceptance.     Dental advisory given  Plan Discussed with: CRNA  Anesthesia Plan Comments:          Anesthesia Quick Evaluation  "

## 2024-10-21 NOTE — Anesthesia Procedure Notes (Signed)
 Procedure Name: LMA Insertion Date/Time: 10/21/2024 1:55 PM  Performed by: Woodrum, Chelsey L, MDPre-anesthesia Checklist: Patient identified, Emergency Drugs available, Suction available and Patient being monitored Patient Re-evaluated:Patient Re-evaluated prior to induction Oxygen Delivery Method: Circle System Utilized Preoxygenation: Pre-oxygenation with 100% oxygen Induction Type: IV induction Ventilation: Mask ventilation without difficulty LMA: LMA inserted LMA Size: 4.0 Number of attempts: 1 Placement Confirmation: positive ETCO2 Tube secured with: Tape Dental Injury: Teeth and Oropharynx as per pre-operative assessment

## 2024-10-21 NOTE — Op Note (Signed)
 10/21/24  Destiny Moody [id number]  OPERATIVE REPORT  Preop Diagnosis: Endometrial polyp, postmenopausal bleeding  Post operative diagnosis: same  Procedure: Procedures: DILATATION & CURETTAGE/HYSTEROSCOPY, POLYPECTOMY   Surgeon: Vera LULLA Pa, MD Assistant: none  Anesthesia: MAC Fluids: please see anesthesia report Fluid deficit: 170cc  Complications: None  Findings: Normal cervix. Uterine cavity measuring 6cm with both ostia seen. 1 endometrial polyp was noted.   Estimated blood loss: Minimal  Specimens:  ID Type Source Tests Collected by Time Destination  1 : endometrial polyp GYN Polyp SURGICAL PATHOLOGY Pa Vera LULLA, MD 10/21/2024 1412   2 : endometrial curettings GYN Endometrium Curettage SURGICAL PATHOLOGY Pa Vera LULLA, MD 10/21/2024 1414    Disposition of specimen: Pathology   Procedure: Patient was taken to the OR where she was placed in dorsal lithotomy in stirrups. She voided prior to transport. SCDs were in place.  The patient was prepped and draped in the usual sterile fashion. An adequate timeout was obtained and everyone agreed. A bivalve speculum was placed inside the vagina and the cervix visualized. The cervix was grasped anteriorly with a single-tooth tenaculum. Paracervical block was performed with 1% lidocaine .  The uterus sounded to 6 cm. Sequential cervical dilation was done to 70fr, and the myosure hysteroscope was introduced into the uterine cavity.  Findings as noted. Small anterior endometrial polyp was seen and completely morcellated using Myosure. No fundal polyp was visualized. The myosure was also used to sample the entire cavity.  A sharp curettage was then performed to ensure complete sampling of the cavity and was sent to pathology. All instrument, sponge and lap counts were correct x2. The patient was awakened taken to recovery room in stable condition.  Vera LULLA Pa, MD 10/21/24 2:25 PM

## 2024-10-21 NOTE — Interval H&P Note (Signed)
 Date of Initial H&P: 09/28/24  History reviewed, patient examined, no change in status, stable for surgery.

## 2024-10-21 NOTE — Transfer of Care (Signed)
 Immediate Anesthesia Transfer of Care Note  Patient: Destiny Moody  Procedure(s) Performed: DILATATION & CURETTAGE/HYSTEROSCOPY WITH RESECTOCOPE (Uterus)  Patient Location: PACU  Anesthesia Type:General  Level of Consciousness: awake, alert , and oriented  Airway & Oxygen Therapy: Patient Spontanous Breathing  Post-op Assessment: Report given to RN and Post -op Vital signs reviewed and stable  Post vital signs: Reviewed and stable  Last Vitals:  Vitals Value Taken Time  BP 115/99 10/21/24 14:34  Temp    Pulse 73 10/21/24 14:41  Resp 14 10/21/24 14:41  SpO2 94 % 10/21/24 14:41  Vitals shown include unfiled device data.  Last Pain:  Vitals:   10/21/24 1234  TempSrc: Oral  PainSc: 0-No pain         Complications: No notable events documented.

## 2024-10-22 ENCOUNTER — Encounter (HOSPITAL_COMMUNITY): Payer: Self-pay | Admitting: Obstetrics and Gynecology

## 2024-10-22 NOTE — Anesthesia Postprocedure Evaluation (Signed)
"   Anesthesia Post Note  Patient: Destiny Moody  Procedure(s) Performed: DILATATION & CURETTAGE/HYSTEROSCOPY WITH RESECTOCOPE (Uterus)     Patient location during evaluation: PACU Anesthesia Type: General Level of consciousness: awake and alert Pain management: pain level controlled Vital Signs Assessment: post-procedure vital signs reviewed and stable Respiratory status: spontaneous breathing, nonlabored ventilation, respiratory function stable and patient connected to nasal cannula oxygen Cardiovascular status: blood pressure returned to baseline and stable Postop Assessment: no apparent nausea or vomiting Anesthetic complications: no   No notable events documented.  Last Vitals:  Vitals:   10/21/24 1530 10/21/24 1545  BP: (!) 145/64 138/62  Pulse: 67 63  Resp: 14 14  Temp:  36.4 C  SpO2: 100% 99%    Last Pain:  Vitals:   10/21/24 1530  TempSrc:   PainSc: 0-No pain                 Finnbar Cedillos L Marillyn Goren      "

## 2024-10-23 ENCOUNTER — Telehealth: Payer: Self-pay | Admitting: *Deleted

## 2024-10-23 LAB — SURGICAL PATHOLOGY

## 2024-10-23 NOTE — Telephone Encounter (Signed)
 Patient left message on surgery scheduling line at 1545. Spoke with patient, patient is s/p Hysteroscopy D&C Myosure on 10/21/24. Patient states she has been lying down most of the day, got to up this afternoon and mopped the floor. Reports Spotting started approximately 2 hrs ago, denies heavy bleeding. Has a pad on, very little blood on pad. Reports menses like cramps 4 out of 10 on pain scale, has been taking 2 regular strength Tylenol  q4 hr, little relief. Reports some intermittent nausea, denies vomiting. Denies fever/chills, urinary symptoms. Eating and hydrating well.   Advised spotting after is not uncommon. Instructed patient to avoid strenuous exercise or activity. Monitor bleeding, if bleeding or pain becomes heavy or severe, changing saturated pad/tampon q1-2 hrs, go to [facility] for further evaluation. Also provided instructions on how to reach on-call provider. Advised can try OTC motrin  for cramping and heating pad. Keep post-op visit as scheduled. Advised I will forward to Dr. Dallie for review, our office will f/u if any additional recommendations. Patient agreeable.   Routing for final review.

## 2024-11-03 ENCOUNTER — Encounter: Admitting: Obstetrics and Gynecology
